# Patient Record
Sex: Male | Born: 1940
Health system: Southern US, Community
[De-identification: ages and names within clinical notes are randomized; demographics above are authoritative.]

## PROBLEM LIST (undated history)

## (undated) DIAGNOSIS — E119 Type 2 diabetes mellitus without complications: Secondary | ICD-10-CM

## (undated) DIAGNOSIS — R079 Chest pain, unspecified: Secondary | ICD-10-CM

## (undated) DIAGNOSIS — G40909 Epilepsy, unspecified, not intractable, without status epilepticus: Secondary | ICD-10-CM

## (undated) DIAGNOSIS — E785 Hyperlipidemia, unspecified: Secondary | ICD-10-CM

## (undated) DIAGNOSIS — I10 Essential (primary) hypertension: Secondary | ICD-10-CM

## (undated) DIAGNOSIS — F32A Depression, unspecified: Secondary | ICD-10-CM

## (undated) DIAGNOSIS — E559 Vitamin D deficiency, unspecified: Secondary | ICD-10-CM

## (undated) DIAGNOSIS — J449 Chronic obstructive pulmonary disease, unspecified: Secondary | ICD-10-CM

## (undated) DIAGNOSIS — I519 Heart disease, unspecified: Secondary | ICD-10-CM

## (undated) DIAGNOSIS — R1310 Dysphagia, unspecified: Secondary | ICD-10-CM

## (undated) DIAGNOSIS — F329 Major depressive disorder, single episode, unspecified: Secondary | ICD-10-CM

## (undated) DIAGNOSIS — G250 Essential tremor: Secondary | ICD-10-CM

## (undated) DIAGNOSIS — G2401 Drug induced subacute dyskinesia: Secondary | ICD-10-CM

## (undated) HISTORY — DX: Major depressive disorder, single episode, unspecified: F32.9

## (undated) HISTORY — DX: Essential tremor: G25.0

## (undated) HISTORY — DX: Heart disease, unspecified: I51.9

## (undated) HISTORY — DX: Vitamin D deficiency, unspecified: E55.9

## (undated) HISTORY — PX: INGUINAL HERNIA REPAIR: SUR1180

## (undated) HISTORY — DX: Depression, unspecified: F32.A

## (undated) HISTORY — DX: Hyperlipidemia, unspecified: E78.5

## (undated) HISTORY — DX: Type 2 diabetes mellitus without complications: E11.9

## (undated) HISTORY — DX: Epilepsy, unspecified, not intractable, without status epilepticus: G40.909

## (undated) HISTORY — DX: Chronic obstructive pulmonary disease, unspecified: J44.9

## (undated) HISTORY — DX: Essential (primary) hypertension: I10

---

## 2003-05-22 ENCOUNTER — Other Ambulatory Visit: Payer: Self-pay

## 2003-05-23 ENCOUNTER — Other Ambulatory Visit: Payer: Self-pay

## 2003-07-27 ENCOUNTER — Inpatient Hospital Stay (HOSPITAL_COMMUNITY): Admission: EM | Admit: 2003-07-27 | Discharge: 2003-08-02 | Payer: Self-pay | Admitting: Emergency Medicine

## 2003-08-06 ENCOUNTER — Inpatient Hospital Stay (HOSPITAL_COMMUNITY): Admission: EM | Admit: 2003-08-06 | Discharge: 2003-08-09 | Payer: Self-pay | Admitting: Emergency Medicine

## 2003-11-14 ENCOUNTER — Inpatient Hospital Stay (HOSPITAL_COMMUNITY): Admission: EM | Admit: 2003-11-14 | Discharge: 2003-11-17 | Payer: Self-pay

## 2004-03-03 ENCOUNTER — Ambulatory Visit: Payer: Self-pay | Admitting: Critical Care Medicine

## 2004-03-03 ENCOUNTER — Inpatient Hospital Stay (HOSPITAL_COMMUNITY): Admission: EM | Admit: 2004-03-03 | Discharge: 2004-03-07 | Payer: Self-pay | Admitting: Emergency Medicine

## 2005-03-09 ENCOUNTER — Ambulatory Visit: Payer: Self-pay | Admitting: Physical Medicine & Rehabilitation

## 2005-03-09 ENCOUNTER — Inpatient Hospital Stay (HOSPITAL_COMMUNITY): Admission: AD | Admit: 2005-03-09 | Discharge: 2005-03-15 | Payer: Self-pay | Admitting: Pediatrics

## 2007-02-14 ENCOUNTER — Emergency Department (HOSPITAL_COMMUNITY): Admission: EM | Admit: 2007-02-14 | Discharge: 2007-02-14 | Payer: Self-pay | Admitting: Emergency Medicine

## 2007-07-13 ENCOUNTER — Ambulatory Visit: Payer: Self-pay | Admitting: Cardiovascular Disease

## 2010-08-28 NOTE — Discharge Summary (Signed)
[name], [name] NO.:  [id number]   MEDICAL RECORD NO.:  [id number]          PATIENT TYPE:  INP   LOCATION:  3020                         FACILITY:  [facility]   PHYSICIAN:  Genene Churn. Love, M.D.    DATE OF BIRTH:  Aug 24, 1940   DATE OF ADMISSION:  03/09/2005  DATE OF DISCHARGE:                                 DISCHARGE SUMMARY   ADDENDUM:   HOSPITAL COURSE:  The patient had no arrhythmias and telemetry was  discontinued. He had no further episodes of seizure-like events between  March 14, 2005, and March 15, 2005. Today he has still not been seen  by occupational therapy but physical therapy feels that he is at his  baseline and speech therapy feels that he should be on dysphagia III diet  with mechanical soft liquids with teaspoon only. He was to be seen by  occupational therapy prior to discharge.   IMPRESSION:  1.  Seizures, code 445.10 and 345.40 with possible pseudoseizures.  2.  Essential tremor code 333.1, aggravated by Depakote, 995.20.  3.  Chronic obstructive pulmonary disease, code 496.0.  4.  Depression, code 311.  5.  History of head trauma as a teenager.  6.  Gait disorder, code 781.2.   PLAN AT THIS TIME:  Discharge the patient with an increase in medications.  Lipitor will be at 40 mg daily.  Carbamazepine will be at 200 mg q.i.d.  Vasotec at 5 mg b.i.d. Depakote 500 mg q.i.d. Phenobarbital 60 mg nightly  and Zoloft 75 mg nightly.  He is to return to Dr. Sharene Skeans in two weeks for  consideration of an evaluation at [facility] with epilepsy monitoring.           ______________________________  Genene Churn. Sandria Manly, M.D.     JML/MEDQ  D:  03/15/2005  T:  03/15/2005  Job:  [id number]

## 2010-08-28 NOTE — Consult Note (Signed)
NAMEGERRAD, WELKER NO.:  [id number]   MEDICAL RECORD NO.:  [id number]          PATIENT TYPE:  INP   LOCATION:  3108                         FACILITY:  [facility]   PHYSICIAN:  Shan Levans, M.D. LHCDATE OF BIRTH:  February 04, 1997   DATE OF CONSULTATION:  03/03/2004  DATE OF DISCHARGE:                                   CONSULTATION   CONSULTING PHYSICIAN:  Shan Levans, M.D. [facility]   CHIEF COMPLAINT:  Respiratory failure, status post seizure.   HISTORY OF PRESENT ILLNESS:  This is a 70 year old white male was admitted  earlier in the day, on March 03, 2004, after he was seen in the office  this morning and found to have active seizing.  He was sent to the emergency  room.  He had seizure activity involving the left hand, arm, and face.  EEG  done in the ER showed right sided focus of a seizure.  The patient has a  previous history of seizure disorder, has been on Keppra, phenobarbital in  the past and has broken through this.  He is now admitted for further  inpatient care.  After he got up to the unit this evening, he was found to  be more apneic and actually developed a full code status.  The patient  basically stopped breathing.  The family member pulled the code blue button.  The patient was seen by the rapid response team and intubated.  This patient  who underwent status epilepticus and unable to maintain his airway.  He is  now on the ventilator in the ICU and we are asked to see this patient in  consultation for vent management.   PAST MEDICAL HISTORY:  Medical history:  1.  Seizure disorder.  2.  History of hypertension.  3.  Hypercholesterolemia.  4.  Chronic obstructive lung disease and has been on Advair in the past as      well.   CURRENT MEDICATIONS:  1.;  Depakote 500 mg b.i.d.  1.  Keppra 500 mg b.i.d.  2.  Phenobarbital 60 mg h.s.  3.  Protonix 40 mg daily.  4.  Tegretol 200 mg t.i.d.  5.  Vasotec 5 mg daily.   ALLERGIES:  PENICILLIN.   SOCIAL HISTORY:  He lives with his wife and denies smoking or alcohol.   Family history, review of systems otherwise noncontributory.   PHYSICAL EXAMINATION:  GENERAL:  This an ill-appearing, sedated white male  in no acute distress.  VITAL SIGNS:  Temp 97, blood pressure 136/98, sat 10%, pulse 87.  CHEST:  Showed to be equal breath sounds.  No wheeze or rhonchi.  CARDIAC:  Showed a regular rate and rhythm without S3.  Normal S1 S2.  ABDOMEN:  Soft, nontender.  EXTREMITIES:  Showed no edema or clubbing.  SKIN:  Clear.  NEUROLOGIC:  Intact.  HEENT:  Showed no jugular venous distention or lymphadenopathy.  Oropharynx  clear.  NECK:  Supple.   LABORATORY DATA:  Sodium 137, potassium 4.2, chloride 104, CO2 24, BUN  16,  creatinine 0.7.  Liver functions unremarkable.  White count 6.1, hemoglobin  15.8.  CK-MB unremarkable.   Chest x-ray showed ET tube in good position.  No acute infiltrates.   IMPRESSION:  1.  Seizure disorder with status epilepticus.  2.  Postictal state with apnea.  3.  Ventilatory dependent respiratory failure.   PLAN:  Full vent support.  Sedate protocol.  Nebs for underlying COPD.      Patr   PW/MEDQ  D:  03/03/2004  T:  03/04/2004  Job:  [id number]   cc:   Deanna Artis. Sharene Skeans, M.D.  [address]  [address]  [place]  [place] [address]  Fax: [phone]   Melvyn Novas, M.D.  [address]  [address]  [place]  [place] [address]  Fax: [phone]   patient's chart

## 2010-08-28 NOTE — H&P (Signed)
[name], [name]                         ACCOUNT NO.:  [id number]   MEDICAL RECORD NO.:  [id number]                   PATIENT TYPE:  INP   LOCATION:  0103                                 FACILITY:  [facility]   PHYSICIAN:  Elliot Cousin, M.D.                 DATE OF BIRTH:  26-Jan-1941   DATE OF ADMISSION:  08/06/2003  DATE OF DISCHARGE:                                HISTORY & PHYSICAL   CHIEF COMPLAINT:  Recurrent seizures.   HISTORY OF PRESENT ILLNESS:  [name] is a 70 year old man recently  admitted on July 27, 2003 with a chief complaint of acute mental status  changes. During the hospitalization from July 27, 2003 until August 02, 2003, it was determined that the patient had a seizure disorder. The seizure  disorder was described as complex partial seizures. The patient was treated  with Dilantin during the hospitalization. He had at least 2 seizures  witnessed during the previous hospitalization. At the time of hospital  discharge, he was ambulating. His delirium had completely cleared. He was  eating and voiding fine. His Dilantin level prior to hospital discharge on  August 02, 2003 was mildly elevated at 25.7. Per his wife, over the past 3  days, the patient has had multiple seizures. He had 1 seizure on the day of  hospital discharge at home. He had 2 more seizures the following day. He had  approximately 8 seizures day before yesterday. The patient had 3 witnessed  seizures the day before his wife decided to bring him back to the hospital.  His last seizure was several hours before he presented to the emergency  department. The seizures are described as mouth drawing, eyes deviating to  the left or to the right, and symptoms consistent with gasping for air. The  patient does not lose complete consciousness. He has not been witnessed to  have total clonic tonic seizures. There has been no tongue biting and no  incontinence of bladder or bowels over the past 24 hours.  His wife held him  up today and prevented him from falling following the seizure. The patient  does not recall having the multiple seizures today. He currently complains  of headache and dry mouth. He also has had some intermittent dizziness. He  has also complained of right sided chest discomfort and constipation. He  denies shortness of breath, pleurisy, cough, fever or chills. He denies  nausea, vomiting or diarrhea. He denies any blindness. He denies any double  vision. When the patient was evaluated in the emergency department, he was  witnessed to have another seizure, which lasted a few seconds and then  stopped. Over the past 2 hours, he has not had another witnessed seizure. It  was found that his Dilantin level was elevated at 27.0. The remainder of his  lab results were unremarkable. The patient will therefore be admitted for  further evaluation  of his seizure disorder and presumed Dilantin toxicity.   PAST MEDICAL HISTORY:  1. Complex partial seizures, diagnosed April 2005.  2. Hypertension.  3. Chronic obstructive pulmonary disease.  4. Hyperlipidemia.  5. Chronic low back pain.  6. History of concussive head injury as a teenager.  7. Status post left inguinal hernia repair as a teenager.  8. History of a left clavicular fracture.   MEDICATIONS:  1. Dilantin 375 mg p.o. q.h.s.  2. Enalapril 5 mg b.i.d.  3. Advair 250/50 mcg 1 puff b.i.d.  4. Zoloft 50 mg q.d.  5. Zocor 20 mg daily.   ALLERGIES:  PENICILLIN.   SOCIAL HISTORY:  The patient is married and lives in [place] with  his wife. He has not been employed in approximately 1 year. 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 He has a  greater than 100 pack year smoking history, however, he stopped smoking in  February of 2005. He denies alcohol use. He denies any illicit drug use. He  can read and write.   FAMILY HISTORY:  His mother died at 44 years of age secondary  to  complications from chronic obstructive pulmonary disease. His father died at  13 years of age secondary to lung cancer. He had a brother who died at 65  years of age from pancreatic cancer. He has a son who is 97 years of age and  healthy. He has a daughter who is 79 years old and healthy. He has another  daughter who is 44 years of age and is healthy. He has a brother who is  alive at 9 years of age and is healthy. He has a sister who is 70 years of  age and is healthy.   REVIEW OF SYSTEMS:  As above in the history of present illness.   PHYSICAL EXAMINATION:  VITAL SIGNS:  Temperature 97. Blood pressure 140/90,  pulse 86, respiratory rate 18. Oxygen saturation on room air is 96%.  GENERAL:  The patient is a pleasant, alert 70 year old Caucasian man who is  currently sitting up in bed, in no acute distress.  HEENT:  Head is normocephalic, atraumatic. Pupils are equal, round, and  reactive to light. Extraocular movements are intact. Conjunctiva are clear.  Sclerae are white. Tympanic membranes are clear bilaterally. Nasal mucosa is  moist. No drainage. Oropharynx reveals fair dentition. His oropharynx mildly  dry. No posterior exudates or erythema. He appears to have abnormal tongue  movement.  NECK:  Supple. No adenopathy. No thyromegaly. No bruit. No jugular venous  distention.  LUNGS:  Clear to auscultation bilaterally.  HEART:  S1 and S2 with a soft systolic murmur.  ABDOMEN:  Positive bowel sounds. Soft, mildly tender over the hypogastrium.  No distention. No guarding. No masses palpated.  EXTREMITIES:  The patient's pedal pulses are 2+ bilaterally. No pre-tibial  edema. No pedal edema.  NEUROLOGIC:  The patient is alert and oriented to self, wife, year and  month. He was not oriented to time or day or place. He follows directions  well. His upper extremity strength reveals 4-/5. Right upper extremity weakness which is chronic per his history. A 5/5 strength of the left  upper  extremity. Bilateral lower extremity strength is 5/5 symmetrically. Strength  was tested at rest. Gait was not tested. Cerebellar, with finger-to-nose  bilaterally he had some mild over shoot but no dysmetria or overt ataxia. He  did demonstrate abnormal tongue movements.  LABORATORY DATA:  On admission white blood cell 8.6, hemoglobin 15.3,  hematocrit 43.6, MCV 88.1, platelets 260,000. Arterial blood gas on 2 liters  pH 7.55, pCO2 24.7, pO2 87.4. Sodium 138, potassium 3.9, chloride 107, CO2  25, glucose 98, BUN 15, creatinine 0.9, calcium 9.3, Dilantin level 27.0 (10  to 20 within normal limits).   ASSESSMENT:  Recurrent seizure disorder. The patient was diagnosed with  complex partial seizures approximately 2 weeks ago. He was sent home on  Dilantin 375 mg p.o. q.h.s. It is important to note that his Dilantin level  was mildly super therapeutic at 25.7 at the time of hospital discharge on  August 02, 2003. Today, he comes in with a Dilantin level of 27.0. The  recurrent seizures could be secondary to Dilantin toxicity and/or  uncontrolled seizures secondary to possibly a non-effective anti-convulsant.  Currently, he is almost back to baseline. He does have some residual post  ictal confusion, however, he is able to follow commands very well on  examination.   PLAN:  1. Dilantin will be held for now.  2. Will admit the patient to a telemetry bed and close cardiac monitoring.  3. Will check a Dilantin level every morning x3 mornings.  4. IV fluids with normal saline at 100 cc an hour.  5. Will treat recurrent seizures with p.r.n. Ativan 1 to 4 mg p.r.n.  6. Will ask for a neurology consultation. However, a curb side consultation     was obtained with Dr. Orlin Hilding. She suggested holding the Dilantin level.     She also felt that the Dilantin toxicity could be the etiology of the     patient's recurrent seizures.  7. Seizure precautions.  8. Supportive care.                                                Elliot Cousin, M.D.    DF/MEDQ  D:  08/06/2003  T:  08/06/2003  Job:  [id number]   cc:   Servando Snare, P.A.  [facility]   Deanna Artis. Sharene Skeans, M.D.  [address]  [address]  [place]  [place] [address]  Fax: [phone]   Gustavus Messing. Orlin Hilding, M.D.  [address]  [address]  [place]  [place] [address]  Fax: [phone]

## 2010-08-28 NOTE — Discharge Summary (Signed)
NAMEJACQUEES, [name] NO.:  [id number]   MEDICAL RECORD NO.:  [id number]          PATIENT TYPE:  INP   LOCATION:  3024                         FACILITY:  [facility]   PHYSICIAN:  Melvyn Novas, M.D.  DATE OF BIRTH:  1940-12-22   DATE OF ADMISSION:  03/03/2004  DATE OF DISCHARGE:                                 DISCHARGE SUMMARY   PRIMARY CARE PROVIDERS:  1.  Dr. Deanna Artis. Hickling.  2.  Dr. Steele Berg. Phifer.   HOSPITAL COURSE:  [name] is a 70 year old gentleman who suffered a  couple of months ago, a concussion or contusion and since then, has  presented with seizure-like activity.  He came to the office on March 03, 2004, in the early morning, and seemed to present with multiple focal  seizures.  Secondary generalization was not seen, but Dr. Deanna Artis.  Hickling arranged for him to be emergently transferred to the hospital,  where he was immediately receiving EEG coverage in the emergency room.  The  EEG showed no active seizure activity, but right-sided intermittent focal  slowing, but the patient had presented with alternating left- and right-  sided arm tonic-clonic activity, so that a non-epileptic origin cannot be  ruled out.  The patient, who had been on multiple-drug regimen at the time,  received Cerebyx 1 g IV here in the ER.  The same night, he developed  hypopneas or apneas and was intubated by the code team on call.  He was  extubated the very next day again and has since then not had any respiratory  difficulties.   CURRENT MEDICAL REGIMEN:  His current regimen for medication is:  1.  Valproic acid 500 mg b.i.d. p.o.  2.  Carbamazepine 200 mg 4 times daily p.o.  3.  Phenobarbital 60 mg at night.  4.  Vasotec 5 mg daily.  5.  Pepcid over-the-counter medication once a day.  6.  He is allowed to take a baby aspirin.  7.  Keppra, a medication he was on at the time of admission, was meanwhile      discontinued.   IMAGING STUDIES:  The  patient had an MRI which showed no abnormalities, a CT  scan which was motion-impaired and seemed to indicate a possible right-sided  hypo-density which has not been verified by MRI study which followed.   DISCHARGE INSTRUCTIONS AND MEDICATIONS:  The patient is not allowed to drive  for 6 months.  He will remain on the valproic acid, carbamazepine and  phenobarbital regimen.  He has to follow up with the teaching service clinic  in 3-4 weeks and with Darrol Angel and Dr. Ellison Carwin at the  [facility] office in 3-4 weeks.  Prescription for  valproic acid was added.   The patient, today, had an ammonia level drawn, which was elevated at 60.  It is recommended that he have another ammonia level drawn on Wednesday or  Thursday of next week; a prescription for this lab will be in his discharge  summary; this is to verify that he does not acquire liver damage  from his  current medications regimen.       CD/MEDQ  D:  03/07/2004  T:  03/07/2004  Job:  [id number]   cc:   [facility]   Steele Berg Phifer, M.D.  [address]  [address]  [place] [address]  Fax: [phone]

## 2010-08-28 NOTE — Discharge Summary (Signed)
NAME:  [name], [name]                         ACCOUNT NO.:  [id number]   MEDICAL RECORD NO.:  [id number]                   PATIENT TYPE:  INP   LOCATION:  5506                                 FACILITY:  [facility]   PHYSICIAN:  Ara D. Tammi Klippel, M.D.                DATE OF BIRTH:  28-Jan-1941   DATE OF ADMISSION:  DATE OF DISCHARGE:  08/02/2003                                 DISCHARGE SUMMARY   PRIMARY CARE PHYSICIAN:  Ms. Tanya Nones, P.A. at [facility].   CONSULTING NEUROLOGIST:  Dr. Deanna Artis. Hickling, [facility].   FINAL DIAGNOSES:  1. Complex partial seizures.  2. Hypertension.  3. Chronic obstructive pulmonary disease.  4. Chronic low-back pain.  5. History of concussive head injury as a teenager.  6. Status post left herniorrhaphy as a teenager.  7. Left clavicular fracture.   FINAL PROCEDURES:  1. Fluoroscopic-guided lumbar puncture performed August 01, 2003, with an     opening pressure of 10 cm, without any complications.  2. Esophogram performed July 31, 2003, showing sliding hiatal hernia with     non obstructing Schatzki ring.  Continuous belching and hiccuping during     the exam precluded double contrast exam.  This was associated with     scattered episodes of intermittent reflux.  3. MRI and MRA of the brain performed July 28, 2003, showing no acute     process with minor changes of small vessel disease, negative intracranial     MRI of the large and medium-sized vessels.  4. A 2-view of the chest performed July 28, 2003, showing no active     disease.  5. CT of the head performed July 27, 2003, showing no acute disease.  6. EEG performed July 29, 2003, showing that the EEG performed during     wakeful state is abnormal due to presence of excessive beta activity.  No     definitive epileptiform features were identified.   PERTINENT LABS AND OTHER TEST RESULTS:  White blood cells 7.5, H&H 15.7 and  44.3, platelets count 221,000.   PT 13.0, INR 1.0, PTT 28, sodium 136,  potassium 4.0, chloride 102, carbon dioxide 28.  Glucose 103.  BUN 11,  creatinine 0.8, calcium 9.0, magnesium 3.4.  Hemoglobin A1c 4.8, cholesterol  245, triglycerides  255, HDL 32, LDL 162.  TSH 0.646, B12 376, Phenytoin  level 5.7.  Urine drug screen was pan negative.  Alcohol level less than 5.  CSF showing no organisms, and no white blood cells.  Cultures still pending.  Glucose 63.  Total protein 44.  VDRL, Alzheimer's markers and CSF cultures  still pending.  VDRL cryptococcal antigen was negative.  RPR was  nonreactive.   SUMMARY OF HOSPITAL COURSE:  The patient is a 70 year old white male with no  significant past medical history, who was admitted for further workup of  progressive neurologic decline.  Problem #1.  Neuropsyche.  There were no signs or symptoms of meningitis,  encephalitis or new onset CVA.  The patient was seen immediately by Dr.  Sharene Skeans in the emergency room, who felt on the differential could have been  postictal stupor, and he was started empirically on anticonvulsive therapy.  Later, although a tonoclonic seizure was never truly witnessed by the  medical staff, the patient's wife who herself suffers from seizure disorder,  saw an episode where he became unresponsive.  His eyes rolled back in the  back of his head, and he had repetitive motion.  However, even before this  was noticed, the patient improved remarkably while on anticonvulsive  therapy.  While he was in house, he had no further seizure episodes.  In  fact, his neurologic status continually improved while he was in house.  There were no in the hospital episodes of acute mania or psychosis.  He was  continued on his SSRI, otherwise, there were no other active issues.   Problem #3.  Cardiopulmonary.  There were no in-hospital episodes of  hypoxemia, orthopnea or PND.  He was maintained on his bronchodilators.   Problem #4.  Cardiovascular.  The patient was  maintained on his outpatient  ACE inhibition with excellent control of his blood pressure.  There were no  signs or symptoms of cardiac ischemia while he was in house.   Problem #5.  Renal.  Medications were renally dosed when appropriate, and  nephrotoxins were avoided.   Problem #6.  GI.  No active issues.   Problem #7.  Fluids, electrolytes and nutrition.  The patient's IV fluids  were med-locked.  His electrolytes were kept within normal limits, and the  patient was evaluated by speech pathology who recommended modifying his diet  by taking small bites or sips only with intermittent supervision.   Problem #8.  Infectious disease.  The patient did undergo a lumbar puncture  to rule out infectious disease.  However, this became much less likely given  his dramatic improvement with Phenytoin.  There were no other signs or  symptoms of systemic infection during admission.   Problem #9.  Endocrine.  The patient's TSH and A1c were all within normal  limits, otherwise no active issues.   Problem #10.  Hem/OC.  No active issues.   Problem #11.  Prophylaxis.  The patient was placed on a PPI for GI  prophylaxis, and was ambulated for DVT prophylaxis.   DISPOSITION:  The patient is being discharged home in much improved  condition, in the care of his wife.  The patient does have a living will  which states that he would not want extraordinary means if he were in a  terminal, incurable or vegetative state.   DISCHARGE MEDICATIONS:  1. Enalapril 5 mg p.o. b.i.d.  2. Fluticasone/ salmeterol 250/50 micrograms, one puff b.i.d.  3. Phenytoin 375 mg p.o. q.h.s.  4. Sertraline 50 mg p.o. daily.  5. Simvastatin 20 mg p.o. daily.   DISCHARGE INSTRUCTIONS:  1. The patient is to take his medications as prescribed.  2. His activity is as tolerated.  3. He is to get a Dilantin level on April 25, with the results called to Dr.     Sharene Skeans at [phone]. 4. He is to followup on Monday, Sep 02, 2003, at 9 a.m. at [facility].  5. He is to return if he feels worse.  Ara D. Tammi Klippel, M.D.    ADM/MEDQ  D:  08/02/2003  T:  08/04/2003  Job:  [id number]   cc:   Tanya Nones, P.A.  [employer], [address]  [place].  [place], [place] [address]   Deanna Artis. Sharene Skeans, M.D.  [address]  [address]  [place]  [place] [address]  Fax: [phone]

## 2010-08-28 NOTE — Consult Note (Signed)
[name], READING                         ACCOUNT NO.:  [id number]   MEDICAL RECORD NO.:  [id number]                   PATIENT TYPE:  INP   LOCATION:  0382                                 FACILITY:  [facility]   PHYSICIAN:  Deanna Artis. Sharene Skeans, M.D.           DATE OF BIRTH:  May 20, 1940   DATE OF CONSULTATION:  08/08/2003  DATE OF DISCHARGE:                                   CONSULTATION   CHIEF COMPLAINT:  Recurrent seizures.   HISTORY OF THE PRESENT CONDITION:  [name] is a 70 year old gentleman  well known to me.  The patient has had a history of gait disorder and  altered mental status dating back to February of 2005.  He had 2 MRI scans,  an EEG, and laboratory work that was unrevealing.  I felt on his initial  assessment last hospitalization that he might be having ictal stupor, which  is a continuous partial complex seizure.  He was treated with IV Dilantin,  and the next morning seemed to be better.  As the days progressed, his  mental status improved more and more.  He became alert, had mild right  central 7th, and a gait disorder; however, even that was more steady with  time.   The patient had 2 seizures during his hospital stay after loading with  Dilantin, and then, over the last 4 days of evaluation, had no known  seizures.  His Dilantin level climbed to 23.5, and I dropped his dose.  Unfortunately, it climbed to 25.7 on the day of discharge, and I was not  notified by the hospitalist service, or I would have told them to hold his  dose for a day, and dropped his level.   At home, the patient has had a series of brief seizures that were 10-15  seconds in duration, associated with generalized tonic-clonic jerking of his  extremities.  Eyes deviated up.  Tongue deviated to the left.  Some facial  grimace to the left.  The patient had one seizure on the day of discharge.  He had 2 seizures on Saturday, 8-10 seizures on Sunday, extending for 10  minutes, and 3  witnessed seizures on Monday.  He was brought into the  hospital on Tuesday for evaluation.  His wife was scared.  She felt that he  had not improved, but really meant that he had not improved in terms of his  seizure control.  The patient had no memory for his seizures, and complained  of a headache and dry mouth on admission, and also intermittent dizziness.  He was noted to have a Dilantin level of 27.  The day before it had been  29.1.   PAST MEDICAL HISTORY:  1. Hypertension.  2. Chronic obstructive pulmonary disease.  3. Dyslipidemia.  4. Chronic low back pain.  5. Prior concussive head injury as a teenager.  6. Left clavicular fracture.   PAST SURGICAL HISTORY:  Left inguinal herniorrhaphy as a teenager.   MEDICATIONS:  1. The patient is currently off Dilantin.  He had been on 375 per day, and     before that 400.  2. Enalapril 5 mg b.i.d.  3. Advair 250/50 mcg one puff b.i.d.  4. Zoloft 50 mg per day.  5. Zocor 20 mg per day.   DRUG ALLERGIES:  PENICILLIN (hives).   FAMILY HISTORY:  Mother died at age 61 secondary to chronic obstructive  pulmonary disease.  Father died at age 71 secondary to lung cancer.  Brother  died at age 70 from pancreatic cancer.  Son, age 8, and daughter, age 33,  are alive and well.  Brother, age 44, and sister age 9, alive and well.   SOCIAL HISTORY:  The patient lives in [place].  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  The patient has a greater than 100 pack year history of  smoking, but stopped smoking in February of 2005.  He does not use alcohol  or drugs.  The patient is literate.  I do not recall how far he went through  school.   REVIEW OF SYSTEMS:  Negative, except as noted above.   I was asked to see him to comment on his Dilantin toxicity and recurrent  seizures, and to make recommendations for further work-up and treatment.   PHYSICAL EXAMINATION:   VITAL SIGNS:  On examination today, temperature 97.5,  blood pressure 124/70, resting pulse 77, respirations 20, O2 saturation 99%  on 2 liters.  HEENT:  No infection, bruits, or meningismus.  LUNGS:  Clear.  HEART:  No murmurs.  Pulses normal.  ABDOMEN:  Soft.  Bowel sounds normal.  No hepatosplenomegaly.  EXTREMITIES:  Unremarkable.  NEUROLOGIC:  The patient was awake and alert.  He scored 25 out of 30 on his  mini mental status exam.  He lost 1 for being unable to tell me what season  it was, 2 for spelling the word world backwards dlorw, and 2 for inability  to properly construct intersecting pentagons, and for difficulty with  repeating the phrased no ifs, ands, or buts.  Cranial nerves - round and  reactive pupils.  Visual fields full to double simultaneous stimuli.  The  patient has a mild right central seventh at rest with movement.  His face is  symmetric.  This is similar to a previous hospitalization.  He had midline  tongue and uvula.  Air conduction greater than bone conduction.  Motor  examination - no drift.  He had normal strength, tone, and mass.  Fine motor  movements were okay.  They were a little slow and clumsy, but probably  within the range of normal.  He is right-handed.  Sensory examination -  sensory stocking, polyneuropathy.  He had good stereoagnosis bilaterally.  Cerebellar examination - good finger-to-nose, rapid repetitive movements.  Gait dystaxic, tentative, slightly broad based.  This is a little worse than  it was last week, and I suspect it is related to his Dilantin toxicity.  Deep tendon reflexes normal at the knees and ankles, diminished in the upper  extremities.  The patient had bilateral flexor plantar responses.  There  were no frontal lobe release signs.   IMPRESSION:  1. Recurrent seizures, likely partial onset with secondary generalization,    possibly a right brain signature, 345.41 and 345.11.  2. Organic gait disorder, which was both a  primary problem (MRI scan showed     minimal small vessel white matter disease.  Spinal tap was normal.  EEG's     have been essentially normal), and also related to the Dilantin toxicity.  3. Mild organic brain syndrome.  Work-up was supposed to include markers for     Alzheimer's and __________, but they apparently were not sent.  VDRL,     cryptococcal antigen, and cell count were unremarkable.  Total protein     was 44, which is slightly abnormal, but does not point to any obvious     etiology.   PLAN:  The patient needs an EEG.  We need to hold his Dilantin until level  is less than 20, and to restart him at 125 mg twice daily.  If that does not  work to control his seizures, will need to consider Depakote or  carbamazepine as possibilities.  There are obviously a host of other  medications that could be tried, but that are much more expensive.  The patient needs to have a physical therapy consult for gait training.  He  might be able to go home tomorrow, once all this is completed.  I appreciate  the opportunity to participate in his care.  If you have questions, do not  hesitate to contact me.                                               Deanna Artis. Sharene Skeans, M.D.    [facility]  D:  08/08/2003  T:  08/08/2003  Job:  [id number]   cc:   Elliot Cousin, M.D.   Servando Snare, M.D.  [facility]

## 2010-08-28 NOTE — Procedures (Signed)
CONSULTING PHYSICIAN:  Melvyn Novas, M.D.   Mr. Geralynn Ochs has an EEG number [phone].   TECHNICIAN:  B. Kolp.   The study is performed as a routine EEG by a 16-channel EEG recording, one-  channel representing heart rate and rhythm exclusively in the international  10/20 placement system.  The patient is described as awake, alert, without  deficits, a right handed male.  He was not exposed to hyperventilation but  to photic stimulation.   MEDICATIONS:  Tegretol, Zoloft, Vasotec, Zocor, and Dilantin.  The patient  was supposed to be on Keppra but apparently was unable to obtain patient  assistance and can not afford the medication.   PAST MEDICAL HISTORY:  1. COPD.  2. Left hernia.  3. Brain concussion.  4. Suspected seizures.  5. Hypertension.  6. Depression.   The patient is a 70 year old right handed male who has had events of hand  twitching, is able to talk through his apparent focal seizures, but seems to  be sometimes confused.  He has not lost complete consciousness.  A spell was  observed by an Charity fundraiser, another one by his wife.  He has only one set of what was  thought to be a generalized major seizure.   A posterior dominant rhythm of 10 hertz is seen throughout posterior  hemispheres.  The study is technically challenging due to electrode popping  and motion artifact in the recording.  The EKG seems to be a normal sinus  rhythm throughout.  Brain wave activity is symmetrica and synchronous and  void of epileptiform discharges.  Photic stimulation did not lead to photic  __________  or epileptiform discharges, nor did stimulation by tactic  stimuli.  Hyperventilation was deferred.   CONCLUSIONS:  This was a normal electroencephalogram for the patient's age  and conscious state with possible left sided intermittent slowing, but due  to motion artifact and electrode detachment, this is a far from certain  finding.    Melvyn Novas, M.D.   AO:ZHYQ  D:  11/15/2003  16:43:52  T:  11/15/2003 18:19:01  Job #:  [id number]   cc:   Deanna Artis. Sharene Skeans, M.D.  [address]  [address]  [place]  [place] [address]  Fax: [phone]   Genene Churn. Love, M.D.  [address]  [address]  [place]  [place] [address]  Fax: [phone]

## 2010-08-28 NOTE — Discharge Summary (Signed)
[name]                         ACCOUNT NO.:  [id number]   MEDICAL RECORD NO.:  [id number]                   PATIENT TYPE:  INP   LOCATION:  0382                                 FACILITY:  [facility]   PHYSICIAN:  Elliot Cousin, M.D.                 DATE OF BIRTH:  [birth year]   DATE OF ADMISSION:  08/06/2003  DATE OF DISCHARGE:  08/09/2003                                 DISCHARGE SUMMARY   DISCHARGE DIAGNOSES:  1. Recurrent seizures secondary to underlying seizure disorder and Dilantin     toxicity.     A. Dilantin level at the time of admission was 27.  Dilantin level at the        time of discharge was 16.3.  2. Complex partial seizures diagnosed July 28, 2003.  3. Chronic gait disorder.   SECONDARY DISCHARGE DIAGNOSES:  1. Hypertension.  2. Chronic obstructive pulmonary disease.  3. Hyperlipidemia.  4. Chronic low back pain.  5. Depression.  6. History of convulsive head injury as a teenager.  7. Status post left inguinal hernia repair as a teenager.  8. History of left clavicular fracture in the past.  9. Chronic congenital right upper extremity weakness.   DISCHARGE MEDICATIONS:  1. Dilantin 100 mg b.i.d.  2. Dilantin 50 mg 1/2 tablet b.i.d.  3. Vasotec 5 mg b.i.d.  4. Advair 1 puff b.i.d.  5. Zoloft 50 mg daily.  6. Zocor 20 mg daily.   DISCHARGE DISPOSITION:  The patient was discharged to home in improved and  stable condition on August 09, 2003.  He was advised to follow up with the  PA, Ms. Folkerts at [employer] in 3-4 days.  He should have a  Dilantin level drawn then.  He will also follow up with Dr. Sharene Skeans on Sep 02, 2003 at 9:30 a.m.   CONSULTATIONS:  Dr. Ellison Carwin.   PROCEDURE PERFORMED:  EEG on August 08, 2003.  The results revealed  generalized slowing.  No seizure activity.  No significant changes since  April 18th EEG.   HISTORY OF PRESENT ILLNESS:  Mr. [name] is a 70 year old man who was  recently diagnosed with  complex partial seizures on July 28, 2003, who  returned to the emergency department on August 06, 2003 with recurrent  seizure.  The history was provided by the patient's wife.  The patient  apparently had multiple seizures at home over the 2-3 days prior to hospital  admission.  A seizure was actually witnessed by the emergency department  physician, Dr. Effie Shy, while the patient was in the emergency department.  When he was evaluated in the emergency department, his Dilantin level was  found to be supratherapeutic at 27.  The patient was therefore admitted for  further evaluation.   HOSPITAL COURSE:  RECURRENT SEIZURES SECONDARY TO UNDERLYING SEIZURE  DISORDER AND DILANTIN TOXICITY:  The initial management included seizure  precautions,  stopping the Dilantin, IV fluids with normal saline at 100  cc/hr, and Ativan as needed for recurrent seizures.  The patient was  initially kept n.p.o. until he was more alert.  The diet was subsequently  advanced to clear liquids and then to a 4 gm sodium diet.  Neuro checks were  instituted every 4 hours x36 hours. The patient was placed on seizure  precautions.  On initial exam, the patient was somewhat postictal, although  he was able to answer questions appropriately.  He did have some  intermittent blurred vision, double vision, and dry mouth.  He was  subsequently admitted to a telemetry bed.  Dr. Sharene Skeans, neurologist, was  consulted.  The recurrent seizures were thought to be secondary to Dilantin  toxicity versus the underlying seizure disorder; however, the patient was  seizure-free prior to hospital discharge several days prior.  There was some  concern regarding whether or not the patient was actually having complex  partial seizures, as witnessed by his wife.  During the three-day hospital  course, the patient had no witnessed seizures.  Seizure precautions were  discontinued, and the patient was advised to ambulate and to get out of bed   with assistance.  The following day after hospital discharge, the patient  was totally alert and oriented and following directions.  He ambulated with  the nursing staff and physical therapy without any difficulty.  He does have  a chronic gait disorder, which sometimes necessitates a walker.  He really  had no specific outpatient physical therapy needs at the time of hospital  discharge.  The patient's Dilantin levels were checked on a daily basis.  At  the time of hospital discharge, his Dilantin level was 16.3.  Dr. Sharene Skeans  recommended restarting the Dilantin when his phenytoin levels were less than  20.  He started the Dilantin at a lower dose, 125 mg b.i.d.  This has  decreased from 375 mg q.h.s. previously.  An EEG was reordered during this  admission.  It revealed generalized slowing but no significant changes  compared with the EEG which was performed on July 29, 2003.   Patient was advised to follow up with his primary care physician in 3-4 days  for hospital followup and to redraw a phenytoin level.  He was advised to  follow up as scheduled with Dr. Sharene Skeans on Sep 02, 2003 at 9:30 a.m.  It is  important to note that Dr. Sharene Skeans did discuss options for anticonvulsant  therapy if the Dilantin therapy failed.  He considered generic  carbamazepine, Trileptal, Lamictal, and Keppra.  The patient does have  financial constraints, and this has a bearing on what type of  anticonvulsants to treat the patient with if the Dilantin fails.                                               Elliot Cousin, M.D.    DF/MEDQ  D:  08/11/2003  T:  08/11/2003  Job:  [id number]   cc:   Deanna Artis. Sharene Skeans, M.D.  [address]  [address]  [place]  [place] [address]  Fax: [phone]   Ms. Folkerts  [employer]

## 2010-08-28 NOTE — Discharge Summary (Signed)
[name], Jay NO.:  [id number]   MEDICAL RECORD NO.:  [id number]          PATIENT TYPE:  INP   LOCATION:  3020                         FACILITY:  [facility]   PHYSICIAN:  Genene Churn. Love, M.D.    DATE OF BIRTH:  [birth year]   DATE OF ADMISSION:  03/09/2005  DATE OF DISCHARGE:  03/15/2005                                 DISCHARGE SUMMARY   Patient's address is [address], [place], [place] [address].   This 70 year old right-handed white married male was admitted from the  office on March 09, 2005 for suspected recurrent seizures by Dr.  Sharene [name].   HISTORY OF PRESENT ILLNESS:  [name] has had a concussion or contusion  in the past and has been treated as has been felt to have seizures since  that time. He has had multiple focal seizures but secondary generalization  was not seen. He was admitted by [name] in February 12, 2004 for  recurrent suspected seizures. He received EEG's in the emergency room  showing no evidence of seizure activity at that time but some intermittent  right-sided focal slowing. At that time, the consideration of nonepileptic  seizures was raised. An MRI study in November 2005 showed no evidence of  abnormalities and a CT scan was motion impaired but seemed to indicate a  hypodensity in the right brain which was not confirmed on the MRI study. The  patient was seen the day of admission in Dr. Darl [name] office as an  outpatient with multiple episodes of blank stares thought to be a complex  partial seizure that did not respond to 500 milligrams of IV Depakote and he  was admitted for further evaluation.   Past medical history is significant for essential tremor worsen by Depakote,  organic gait disorder require a cane, hyperlipidemia, hypertension, lower  back pain, chronic obstructive pulmonary disease, depression, closed head  injury as a teenager with presumed seizures since that time, and left  clavicular  fracture. He had a past history of inguinal herniorrhaphy.   MEDICATIONS ON ADMISSION:  1.  Carbamazepine 200 milligrams t.i.d.  2.  Depakote 500 milligrams q.i.d.  3.  Lipitor 40 milligrams in the evening.  4.  Vasotec 5 milligrams twice daily.  5.  Zoloft 75 milligrams in the evening.  6.  Phenobarbital 64.8 milligrams per day.   ALLERGIES:  He has a history of allergies to PENICILLIN and is intolerant of  KEPPRA.   The patient lives with his wife. His daughter lives near him across the  street. He has had coronary artery disease with coronary artery bypass  surgery. He quit smoking cigarettes in May 15, 2003 and does not drink  alcohol. At the time of admission, his neurologic examination revealed vital  signs to be normal.   Family history has been dictated elsewhere.   At the time of admission, his neurologic examination revealed that  intermittently he was conversational and at other times he was unresponsive.  His visual fields were full. His face was symmetric. His motor examination  revealed good strength in the upper and in  the lower extremities. He had  essential tremor with intention tremor and kinetic tremor. Deep tendon  reflexes were decreased. His general examination was unremarkable.   LABORATORY DATA:  Laboratory data revealed a white blood cell count of 5100,  hemoglobin was 14.7, hematocrit 41.2, platelet count 135,000. There were 41%  polys, 40% lymphs, 12% monocytes, 6% eosinophils, 1% basophils. Sodium is  139, potassium 3.7, chloride 103, CO2 content 29, glucose 80, BUN 11,  creatinine 0.7, total bilirubin 0.8. Alkaline phosphate 48, SGOT 31, SGPT  22, total protein 5.8, albumin 3.3, calcium 8.4. His initial Carbamazepine  levels 4.3 on March 09, 2005 with a repeat of 4.9 on March 10, 2005.  Phenobarbital level is 28.3. Initial valproic acid level was 92.8 on  March 09, 2005 with a repeat of 84.6 on March 10, 2005. His telemetry  in the  hospital showed normal sinus rhythm. Portable abdominal film showed  feeding tube at the tip of the gastric antrum. Fluoroscopy for swallowing  function was performed in the hospital. EEG showed some mild background  slowing and some left frontal delta waves. There was no change during  periods of unresponsive behavior.   HOSPITAL COURSE:  The patient was admitted and question of pseudoseizures  was raised. At times during episodes, he would not respond at all. During  the EEG, it was felt that the EEG was normal and no definite epileptiform  activity was seen. He was noted to have asymmetric exam complicated by  significant tremor. He underwent a modified barium swallow study which  showed mild pharyngeal dysphagia with a delayed swallow of all liquids with  instances of laryngeal penetration above the vocal cords and cough response  noted. It was felt that the patient was at further risk for aspiration and  it was recommended he be placed on a mechanical soft diet, dysphagia three  with thin liquids. The thin liquids to be given by teaspoon full only, no  cup sips, crushed medications and need to be fed secondary to the tremors.  The patient was seen by physical therapy who recommended rehabilitation  versus skilled nursing home because of his tremor and gait disorder. The  patient tolerated the diet pretty well in the hospital. He had one episode  on March 12, 2005 of possible seizure. The patient was being helped to  the bathroom with assistant nurse. When he got there, he began shaking and  his eyes did roll back and he stuck his tongue out. There was some loss of  conscious for about two minutes and [name] treated the patient with IV  Ativan and Depakote. It was not clear whether this represented a true  seizure or a so called pseudoseizure. He had no other seizures in the  hospital according to the progress notes.   IMPRESSION: 1.  History of complex partial seizures,  possibly secondary to head trauma.      They were intractable, code 345.1.  2.  Status post closed head trauma as a teenager.  3.  Chronic obstructive pulmonary disease,  496.0.  4.  Depression, code 311.0.  5.  Coronary artery disease, code 429.2.  6.  Essential tremor, code 333.1, aggravated by Depakote, code 995.20.  7.  Organic gait disorder, code 781.2.  8.  Hyperlipidemia, code 272.4.  9.  Hypertension, code 796.2.  10. Lower back pain, code 724.4.   PLAN:  Plan at this time is to discharge the patient to the rehabilitation  service on Lipitor 40  milligrams daily, Carbamazepine 200 milligrams q.6h.,  Vasotec 5 milligrams q.12h., Depakote 500 milligrams q.i.d., phenobarbital  60 milligrams q.h.s., Zoloft 75 milligrams q.h.s. and p.r.n. is to include  Tylenol q.4h. p.r.n. pain, Senna docusate sodium q.h.s. p.r.n. pain and  Phenergan suppositories p.r.n. nausea. Patient is discharged on a dysphagia  III diet with  thin liquids to only be given by spoon. He is to be followed by the  rehabilitation service. He is to have a full recording to determine if these  are true seizures or not as an outpatient at [facility] as ordered by [name].           ______________________________  Genene Churn. Sandria Manly, M.D.     JML/MEDQ  D:  03/14/2005  T:  03/15/2005  Job:  [id number]   cc:   Alvester Morin, M.D.  Fax: [phone]

## 2010-08-28 NOTE — Discharge Summary (Signed)
[name], [name]                         ACCOUNT NO.:  [id number]   MEDICAL RECORD NO.:  [id number]                   PATIENT TYPE:  INP   LOCATION:  3729                                 FACILITY:  [facility]   PHYSICIAN:  Catherine A. Orlin Hilding, M.D.          DATE OF BIRTH:  1940/10/01   DATE OF ADMISSION:  11/14/2003  DATE OF DISCHARGE:  11/17/2003                                 DISCHARGE SUMMARY   ADMITTING DIAGNOSIS:  Seizures, plus or minus pseudoseizures.   DISCHARGE DIAGNOSIS:  Seizures, plus or minus pseudoseizures.   MEDICATIONS ON DISCHARGE:  1. Tegretol 200 mg t.i.d.  2. Phenobarbital 60 mg q.h.s.  3. Zoloft 75 mg daily.  4. Vasotec 5 mg b.i.d.  5. Zocor 20 mg daily.   PAIN MANAGEMENT:  Not applicable.   DISCHARGE ACTIVITY:  No driving.   DISCHARGE DIET:  No restrictions.   WOUND CARE:  Not applicable.   FOLLOWUP:  With Dr. Sharene Skeans, call [phone] to make an appointment.   STUDIES PERFORMED:  1. An EEG.  I do not find the report of that at the time of discharge,     interpreted by Dr. Porfirio Mylar Dohmeier.  The EEG was interpreted as normal,     possibly some left-sided slowing, but no seizure activity was seen.  2. An MRI of the brain showed no acute abnormality, no significant change     from previous study, with a few small white matter hyperintensities most     compatible with chronic infarct.   LABORATORY DATA:  Included a Dilantin level of 7.1, carbamazepine is 7.0,  normal CBC with differential.  Essentially normal __________ .  Glucose was  108, pCO2 was 33.4.   He also had a chest x-ray showed some left atelectasis.  There was a  question of nodular density over the peripheral mid right lung.  A CT scan  was recommended as possibly maybe indicated to exclude an underlying lesion.   </   HOSPITAL COURSE:  Please refer to the history and physical for details.  This is a 70 year old with a history of possible seizures versus  pseudoseizures, followed by  Dr. Sharene Skeans, generally speaking, who has been  in and out of the hospital.  He was on Dilantin for a while and had more  seizures.  He was switched to Tegretol.  He may have as many as 27 in 47  days.  On the day of admission, he was supposed to have had 20 blank stares  with right-hand tremor.  However, after being admitted to the emergency  room, he had none for two days.  He was given 1 mg of Ativan, a couple of  doses of Valium en route.  He had an MRI and EEG as reported.  He had  adjustment made in his Zoloft after being seen by psych, and phenobarbital  was added to Tegretol due to cost considerations.  He continued to  improve  to a normal baseline in the hospital without further seizures, and is  discharged in improved condition with the above medications and  restrictions.                                                Catherine A. Orlin Hilding, M.D.    CAW/MEDQ  D:  11/17/2003  T:  11/18/2003  Job:  [id number]

## 2010-08-28 NOTE — Procedures (Signed)
CLINICAL COURSE:  The patient is a 70 year old gentleman with chronic  posturing of his left side, eyes deviated to the left associated with  generalized tonic-clonic jerking.  This is being done to look for the  presence of seizures and also to look for change in comparison with the  April 18 study.   DESCRIPTION OF PROCEDURE:  The tracing is carried on a 32 channel digital  ___________ recorder reformatted into 16 channel montages with ____________  EKG.  The patient was awake during the recording. He takes Dilantin.  The  international 10 to 20 system of lead placement was used.   FINDINGS:  Dominant frequency is in 8 Hz rhythmic 15 microvolt activity that  poorly attenuates with eye opening.   Background activity consisted of mixed frequency predominant alpha and upper  theta range activity with theta range components as well as 6-7 Hz and  broadly distributed firmly predominant beta range activity under 10  microvolts.   There was no focal filling in the background. There was no interictal  epileptiform form activity in the form of spikes of sharp waves.  EKG showed  a regular sinus rhythm with ventricular response of 90 beats per minute.   IMPRESSION:  Abnormal EEG on the basis of mild diffuse background slowing.  This is a nonspecific indicator of neuronal dysfunction maybe on a primary  degenerative basis or secondary to a variety of toxic or metabolic  etiologies. In this case, the patient was drowsy throughout much of the  record. The background seemed slightly slower than previously but at its  best the records were very similar. No seizure activity or focality was  seen.    WILLIAM H. Sharene Skeans, M.D.   OZH:YQMV  D:  08/08/2003 19:20:21  T:  08/08/2003 20:24:41  Job #:  [id number]

## 2010-08-28 NOTE — Procedures (Signed)
The patient is a patient who has a history of possible seizures, but as far  as I understand, it was difficult to prove if these were truly epileptic  seizures in the past. The patient has a history of ___________ and it was  after this possible brain trauma that his seizure disorder began. He has  failed so far, being on Keppra and phenobarbital. He has so far not yet been  on Lamictal or on Depakote. The patient's EEG was recorded before and during  infusion of Cerebyx, Dilantin equivalent of 1 g. The patient has continued  according to the technician to shake his right arm during the study  recording. He was responsive and follow commands. His son states that his  father became limp on the left side after he had Todd's  paralysis on the  left before ?   DESCRIPTION:  __________ was established at about 8 to 9 Hertz frequency and  appears regular bilaterally. There is frequent motion artifact which seems  to be also EKG artifact. The continued arm shaking made the recording  difficult. The technician noted sometimes the right arm to shake, then both.  During these events, there was no epileptiform discharge noted. Again, there  is asymmetry seen intermittently with right sided slowing, including frontal  and temporal regions. It was not possible to prove active epileptiform  discharges.   CONCLUSION:  Abnormal EEG due to frequent motion artifact, focal slowing,  and slow frequency artifact perhaps due to electrode impedance impairment.      UJ:WJXB  D:  03/03/2004 15:39:36  T:  03/03/2004 17:14:31  Job #:  [id number]

## 2010-08-28 NOTE — Procedures (Signed)
REFERRING PHYSICIAN:  Ara D. Tammi Klippel, M.D.   CLINICAL HISTORY:  A 70 year old gentleman with altered mental status  previously thought to be ictal stupor.  The patient is on Dilantin and has  improved.   MEDICATIONS:  1. Lovenox.  2. Vasotec.  3. Advair.  4. Zoloft.  5. Ambien.  6. Dilantin.  7. Ventolin.  8. Atenolol.  9. Ibuprofen.   STUDIES:  This is a routine EEG study performed using standard 10/20  electrode placement and 17 channels.   No clear posterior rhythm is identified.  Low frequency high amplitude of  beta activity seen throughout the recording in diffuse fashion.  No  paroxysmal epileptiform activity, spikes or sharp waves seen.  Hyperventilation results in no significant abnormalities.  Photic  stimulation is uneventful.  Length of the tracing is 22.9 minutes.  Technical component is adequate.  EKG tracing shows normal sinus rhythm.   IMPRESSION:  This electroencephalogram performed during wakeful state is  abnormal due to presence of excessive beta activity.  No definitive  epileptiform features, however, are identified.    Marney Doctor, MD   EAV:WUJW  D:  07/29/2003 17:05:06  T:  07/30/2003 09:02:55  Job #:  [id number]

## 2010-08-28 NOTE — H&P (Signed)
[name], [name]                         ACCOUNT NO.:  [id number]   MEDICAL RECORD NO.:  [id number]                   PATIENT TYPE:  INP   LOCATION:  1829                                 FACILITY:  [facility]   PHYSICIAN:  Genene Churn. Love, M.D.                 DATE OF BIRTH:  1941-04-11   DATE OF ADMISSION:  11/14/2003  DATE OF DISCHARGE:                                HISTORY & PHYSICAL   REASON FOR ADMISSION:  This is a second [facility]  admission for this 70 year old right-handed white married male from [place],  [place] admitted from the emergency room for evaluation of  seizures.   HISTORY OF PRESENT ILLNESS:  This patient presented with a history of  altered mental status in February of 2005 and was admitted by the  hospitalist, seen in consultation by Dr. Deanna Artis. Hickling.  There was a  concern that he may be in a postictal stupor.  The patient was evaluated  with MRI of the brain with and without contrast enhancement,  EEG, and  spinal tap all of which were unremarkable.  He did have an elevated lipid  profile with a cholesterol of 245 and triglycerides of 255, HDL of 32, and  LDL of 162.  His drug screen at that time was unremarkable and CSF showed no  white blood cells, no red blood cells, protein at 44, and a glucose of 63.  This CSF VDRL which was nonreactive.  CSF cryptococcal antigens were  negative and a __________ beta amyloid 42 protein were pending.   He was discharged on Tegretol after being switched from Dilantin therapy.  As an outpatient, he has had recurrent episodes thought possibly to  represents seizures, as many as 27 in 47 days, and today may have had as  many as 20 characterized by blank stare, at times a right hand tremor, at  which time he would be awake, and then one episode thought to represent a  generalized major motor seizure.   He came to the emergency room and over a three and a half hour evaluation he  had no  episodes.  It was noted that he had a therapeutic Tegretol level and  a low Dilantin level.  He received Ativan 1 mg by Dr. Wallace Cullens and previously by  ambulance had received 5 mg x 2 of Valium.   PAST MEDICAL HISTORY:  1. Significant for COPD.  2. Left hernia repair.  3. Brain concussion.  4. Hypertension.  5. Suspected seizures.  6. Depression.   MEDICATIONS:  1. Tegretol 400 mg XR 1 b.i.d.  2. Zoloft 50 mg q.d.  3. Vasotec 5 mg b.i.d.  4. Zocor 20 mg q.d.  5. Dilantin 200 mg in the morning and 100 mg in the evening.   SOCIAL HISTORY:  He finished high school.  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  He does not use cigarettes or drink alcohol.   FAMILY HISTORY:  Reveals that his mother had died at age 62 from cancer and  his father died at 44 from cancer.  He has a brother who died at 72 from  cancer.  He has a brother 25, living, and well.  He has his wife who has had  a history of seizures.  His daughters are 22 and 54 and his sons 68 and 89  and another daughter 30-all of whom are in good health.   PHYSICAL EXAMINATION:  GENERAL:  Well-developed, well-nourished who has  right hand and arm jerking voluntarily.  VITAL SIGNS:  Sitting blood pressure in the right and left arm are 130/80  and 140/80.  Heart is 64 and regular.  He is afebrile.  There were no  bruits.  NEUROLOGICAL:  He is alert.  He is oriented x 3.  He knew the president, but  not the vice president or governor.  Did not know the number of nickels in a  $1 or $1.25.  He had poor spelling.  Cranial nerve examination:  Revealed  visual fields are full with a right 7th.  Tongue midline and uvula midline.  Gags present.  Sternocleidomastoid and trapezius testing normal.  Motor  examination:  Good strength in the upper and lower extremities when he would  give an effort.  He had a waxy appearance to his motor tone.  His arm could  be placed  in one position and it would tend to stay there.  Sensory  examination:  Intact to pinprick, touch, position.  Deep tendon reflexes:  They are 1 to 2+.  Plantar responses are downgoing.  LUNGS:  Clear.  HEENT:  His tympanic membranes were clear.  He was edentulous.  HEART:  No murmurs.  ABDOMEN:  Bowel sounds were normal.   LABORATORY DATA:  Hemoglobin was 15.0, hematocrit 44, capillary blood  glucose was 108.  Sodium is 140, potassium is 3.7, chloride 108, CO2 content  24, BUN 17, and glucose 108.   IMPRESSION:  1. Suspect nonepileptic seizures, code 345.4.  2. Depression, code 311.  3. Hypertension, code 796.2.  4. History of hyperlipidemia, code 272.4.   PLAN:  Plan at this time is to evaluate the patient with EEG and decrease  his Tegretol to 100 mg t.i.d. and hold Dilantin at this point.                                                Genene Churn. Sandria Manly, M.D.    JML/MEDQ  D:  11/14/2003  T:  11/15/2003  Job:  [id number]

## 2010-08-28 NOTE — Procedures (Signed)
EEG NUMBER:  [id number]   HISTORY OF PRESENT ILLNESS:  This is a 70 year old patient with history of  seizures who has had problems with confusion and lethargy, seizure events.  The patient is being admitted for evaluation and control of his seizures  events.   This is a portable EEG recording.  No skull defects were noted.  Medications  included phenobarbital, Vasotec, Lipitor, Zoloft and Carbamazepine and  Valproic acid.   EEG CLASSIFICATION:  Normal awake.   DESCRIPTION OF RECORDING OF THE BACKGROUND RHYTHM:  This recording consisted  of a fairly well modulated medium amplitude alpha rhythm at 9 Hertz.  He has  reaction to eye opening and closure as the record progresses.  Initial  portions of the recording are filled with what looks like an electrical  artifact that is seen most prominently in the right hemisphere than the  left, but this is eliminated with filtering and electrode manipulation.  The  patient spends the rest of the recording in the waking state.  Photic  stimulation and hyperventilation is not performed.  At no time during the  recording does there appear to be evidence of spikes, spike wave discharges  or focal slowing.  EKG monitor shows no evidence of cardiac rhythm  abnormalities with heart rate of 66.   IMPRESSION:  This is a normal EEG recording in the waking state.  No  evidence of ictal or inner ictal discharges were seen.      Marlan Palau, M.D.  Electronically Signed     NFA:OZHY  D:  03/09/2005 17:03:39  T:  03/10/2005 07:28:16  Job #:  [id number]

## 2010-08-28 NOTE — H&P (Signed)
NAMEABHISHEK, [name]               ACCOUNT NO.:  [id number]   MEDICAL RECORD NO.:  [id number]          PATIENT TYPE:  INP   LOCATION:  NA                           FACILITY:  [facility]   PHYSICIAN:  Deanna Artis. Hickling, M.D.DATE OF BIRTH:  08-31-1940   DATE OF ADMISSION:  03/09/2005  DATE OF DISCHARGE:                                HISTORY & PHYSICAL   CHIEF COMPLAINT:  Recurrent seizures.   HISTORY OF PRESENT ILLNESS:  [name] is a 70 year old right-handed  married Caucasian gentleman who presents to the office today with several  witnessed complex partial seizures which continued despite 500 mg IV  Depakote.   The patient has been on multiple treatment regimens in the past and has  intermittent control of his seizures.  His wife says that he has had  increasing tremors over the past month.  Along with that, there have been  episodes of unresponsive staring spells.  I think that she is aware when he  is having seizures, but for reasons that are unclear to me, the family does  not always call.   The patient has not recently had any generalized tonic clonic events.   PAST MEDICAL HISTORY:  Remarkable for essential tremor worsened by Depakote,  organic gait disorder, dyslipidemia, hypertension, low back pain, chronic  obstructive pulmonary disease, depression, closed head injury as a teenager,  left clavicular fracture.   PAST SURGICAL HISTORY:  Inguinal herniorrhaphy.   REVIEW OF SYSTEMS:  Negative except as noted above.   MEDICATIONS:  1.  Carbamazepine 200 mg t.i.d.  2.  Depakote 500 mg q.i.d.  3.  Lipitor 40 mg in the evening.  4.  Vasotec 5 mg twice daily.  5.  Zoloft 75 mg in the evening.  6.  Phenobarbital 64.8 mg per day.   ALLERGIES:  PENICILLIN.   INTOLERANCES:  Keppra.   SOCIAL HISTORY:  The patient lives with his wife.  His daughter lives across  the street.  His wife just had a triple bypass and is having difficulty  caring for him.  There are other  children who live in the area.  The patient  quit smoking cigarettes in February of 2005 and does not drink alcohol.   FAMILY HISTORY:  Mother died at age 56 of chronic obstructive pulmonary  disease.  Father died at age 61 of lung cancer.  Brother at age 19 died of  pancreatic cancer.  The patient has a brother and sister who are alive and  well and three children who are alive and well.   PHYSICAL EXAMINATION:  GENERAL:  Thin, elderly gentleman in no acute  distress.  VITAL SIGNS:  Not available at this time. No signs of infection.  LUNGS:  Clear.  HEART:  No murmurs.  ABDOMEN:  Soft.  Bowel sounds normal.  EXTREMITIES:  Thin with no lesions.  MENTAL STATUS:  The patient is intermittently conversational and  unresponsive.  NEUROLOGY:  Visual fields were full to objects brought in from the  periphery.  Symmetric facial strength and midline tongue.  Motor  examination; the patient had normal strength,  normal functional strength,  essential tremor.  Sensation; withdrawal x4.  I could not test  stereoagnosis.  Cerebellar; the patient's essential tremor obscures presence  or absence of an intentional tremor.  Deep tendon reflexes were diminished.   IMPRESSION:  1.  Recurrent seizures, complex partial in nature, intractable.  345.41  2.  Status post closed head injury as a teenager which may be the cause of      this.  The patient was previously admitted on November 22 through      March 07, 2004.  MRI at that time was negative.  EEG showed possibly      some slowing on the left side, but no ictal lesions.   PLAN:  The patient will be admitted to the hospital and treated with IV  Cerebyx 1000 mg phenytoin equivalents.  I would consider placing him on  Topamax to see if we can control seizures and get rid of the essential  tremor.  We will discuss that over the next day or so.  I am concerned about  getting rid of the Depakote because it has done a fairly good job of  controlling  his seizures as best we know.   In addition to Cerebyx, we will evaluate the patient with an EEG, continue  home medications, and check basic laboratories including CBC, comprehensive  metabolic panel, and trough, carbamazepine and valproic acid levels.      Deanna Artis. Sharene Skeans, M.D.  Electronically Signed     WHH/MEDQ  D:  03/09/2005  T:  03/09/2005  Job:  [id number]   cc:   Alvester Morin, M.D.  Fax: [phone]

## 2010-08-28 NOTE — Consult Note (Signed)
NAMEYERIK, ZERINGUE                         ACCOUNT NO.:  [id number]   MEDICAL RECORD NO.:  [id number]                   PATIENT TYPE:  INP   LOCATION:  1825                                 FACILITY:  [facility]   PHYSICIAN:  Deanna Artis. Sharene Skeans, M.D.           DATE OF BIRTH:  Jan 28, 1941   DATE OF CONSULTATION:  07/27/2003  DATE OF DISCHARGE:                                   CONSULTATION   CHIEF COMPLAINT:  Altered mental status.   HISTORY OF PRESENT CONDITION:  The patient is a 70 year old married  Caucasian right-handed gentleman who had rather sudden onset in early  February of altered mental status.  He went from being normal ambulatory  person with low-average cognitive skills to a very confused person who was  shuffling within the space of a day.   The patient has had slow deterioration in his gait since that time, and his  language has become even more jumbled, particularly involving his ability to  understand what is said to him and to express himself.   The patient was hospitalized at [facility] for three days.  CT scan of the brain, which I have reviewed, reveals only a few small areas  of increased T2 and flare signal.  There were no abnormalities in diffusion-  weighted imaging, none in contrast studies, and his MRA of the intracranial  vessels was pristine.   The patient was seen by a psychiatrist at that time and was diagnosed with  depression, placed on an SSRI.  In March, the patient went to [facility] where he was seen by a neurologist who carried out an EEG which  was negative.  She told the family that she was not able to find anything  and later called them to ask for the MRI that had been done in February and  second MRI that had been done in March.  The patient's wife became angry and  refused to send them.   The patient has been shuffling around the house, barely getting around.  This morning he was unable to walk at all.  The  family brought him into the  emergency room around 2 p.m.  I was called to see him around 9.  Decision  was made to admit him because he could no longer be cared for at home and  also for further diagnostic workup.   PAST MEDICAL HISTORY:  1. Hypertension.  2. Chronic obstructive pulmonary disease.  3. Chronic low back pain.   PAST SURGICAL HISTORY:  1. Left herniorrhaphy.  2. Left clavicular fracture.   CURRENT MEDICATIONS:  1. Enalapril 5 mg b.i.d.  2. Zoloft 50 mg per day.  3. Advair 250/50.  4. Albuterol inhaler.   DRUG ALLERGIES:  PENICILLIN.   FAMILY HISTORY:  Mother died at age 76, COPD.  Father died at age 21, lung  cancer.  The patient had a sister die at age  60 of pancreatic cancer.  There  are several other siblings who are in good health.   SOCIAL HISTORY:  The patient has greater than 100-pack-years history of  smoking.  No use of alcohol or drugs.  He live in [place] with his wife.  He is retired.  She has always done the family finances.   REVIEW OF SYSTEMS:  Unremarkable for infectious complaints.  The patient has  had a chronic vertex headache that has been treated with a variety of  different medications with inconsistent relief.  Twelve system review is  other wise negative.   PHYSICAL EXAMINATION:  GENERAL:  On examination today, this is a pleasant  gentleman lying in bed confused.  VITAL SIGNS:  Temperature 97.1, blood pressure 169/106, resting pulse 77,  respirations 22, pulse oximetry 99% on room air.  HEENT:  Ear, nose, and throat: No infections.  NECK: Supple, no bruits.  LUNGS:  Clear.  HEART:  No murmurs, pulses normal.  ABDOMEN:  Soft, slightly tender, bowel sounds normal.  No  hepatosplenomegaly.  EXTREMITIES:  No edema. There are some chronic venous changes.  NEUROLOGIC:  The patient is easily distractible.  He makes eye contact  rarely.  He has frequent smacking of his lips, some eye blinking, and  twitching of the left corner of his  mouth.  He was able to name some objects  and recall his wife's name.  He had some social conversation.  He is totally  disoriented.  He has very poor memory and dyscalculia.  Cranial Nerves:  Round, reactive pupils, fundi normal, visual fields full to counting  fingers.  He does not always look at the objects.  Sometimes he touched my  fingers in order to count them.  Extraocular movements were full, sometimes  conjugate.  At other times they were superiorly but dysconjugately deviated  to the right or left.  The patient has a right central seventh or appears to  have a central seventh.  He also has some twitching of the left corner of  his mouth.  He has a midline tongue.  He is able to hear conversations and  respond to them to some degree.  Motor examination shows ability to lift all  four extremities.  He grips equally.  He seems to be a bit weaker in the  left arm, but he shows very good fine motor movements on the right and the  left when he is performing stereoagnosis.  Sensory examination shows  withdrawal of his right arm and both legs to noxious stimuli but does not  turn from pinching either earlobe or his left arm.  Stereoagnosis was  present in both hands for large objects.  Cerebellar examination and gait  could not be tested. Reflexes were diminished to absent.  His toes showed  withdrawal.  He had no frontal lobe release signs.   IMPRESSION:  Dementia (294.9), delirium (293.0).  This is a very unusual  case.  The patient seemed to have ery sudden onset of altered mental status  in February and then a slow gradual decline since that time.  This certainly  would make one wonder about the possibility of a cerebrovascular accident,  but the MRI scan does not support that.   Given that there have been some new focal neurologic deficits involving the  right corner of the mouth and the left arm since last done in March, it seems reasonable to have a third MRI done so that we can  compare  MRIs  looking for any change.   In addition, I think we need to be concerned about the possibility of ictal  stupor, which is a continuous form of complex partial seizures.  I plan to  treat that with IV Dilantin tonight.  If there is no change in the patient,  he will have a lumbar puncture under fluoroscopy looking for infection,  Alzheimer's, and Creutzfeldt-Jakob disease as well as some form of  underlying inflammatory condition.   MRI of the brain without and with contrast will also be done.  At that time,  we will have to consider what other tests should be done.  Workup for  treatable causes of dementia has already ordered.   I appreciate the opportunity to participate in the patient's care.  If you  have questions or I can be of assistance, do not hesitate to contact me.                                               Deanna Artis. Sharene Skeans, M.D.    [facility]  D:  07/27/2003  T:  07/29/2003  Job:  [id number]   cc:   Ara D. Tammi Klippel, M.D.  Fax: [phone]

## 2010-08-28 NOTE — H&P (Signed)
NAME:  [name], [name]                         ACCOUNT NO.:  [id number]   MEDICAL RECORD NO.:  [id number]                   PATIENT TYPE:  INP   LOCATION:  1825                                 FACILITY:  [facility]   PHYSICIAN:  Ara D. Tammi Klippel, M.D.                DATE OF BIRTH:  [birth year]   DATE OF ADMISSION:  07/27/2003  DATE OF DISCHARGE:                                HISTORY & PHYSICAL   PRIMARY CARE PHYSICIAN:  Cristi Loron, P.A., [place].   CHIEF COMPLAINT:  Increased confusion and weakness.   HISTORY OF PRESENT ILLNESS:  The patient is a 70 year old white male with no  significant past medical history who was in his usual state of health in the  beginning of the 2005 calendar year when on May 18, 2003 the patient's  wife relates a history of what appears to be an acute exacerbation of COPD.  The patient was taken to his primary care office and then the following two  days prior the patient was out of it. In particular, he was asked to see  his parents who had been dead for quite some time. He was taken back to the  doctor's office at which point EMS was summoned, and he was taken to  [facility] where he underwent a three-day  hospitalization. There, a variety of tests were performed, and no specific  neurologic diagnosis was given; however, he was seen by a psychiatrist who  felt that he was depressed, and the patient was treated with a low of  Sertraline. The patient then saw a neurologist in [place] in which an EEG  was performed which was as by report unremarkable. The patient's symptoms  continued, and he had a repeat MRI at the end of March 2005 which per report  showed no significant change from the February 2005 MRI. The patient's  condition has gradually worsened where he has gotten to the point where he  is unable to walk or even sit up at this time. The patient is unable to  answer questions, so much of his history is taken from the  wife and the  brother who are in the emergency room at this time. The only other  abnormality aside from his weakness and fluctuating mental status is one  where the patient is having hallucinations in which he is seeing nurses and  other people in the room when he is at home.   ALLERGIES:  PENICILLIN.   MEDICATIONS:  1. Albuterol metered dose inhaler one puff q.6h.  2. Enalapril 5 mg p.o. b.i.d.  3. Fluticasone/salmeterol 250/50 mcg one puff b.i.d.  4. Sertraline 50 mg p.o. q.d.   PAST MEDICAL HISTORY:  1. COPD.  2. Hypertension.  3. Chronic low back pain.  4. History of concussive head injury as a teenager.   PAST SURGICAL HISTORY:  1. Status post left herniorrhaphy as a teenager.  2. Status post left clavicular fracture.   SOCIAL HISTORY:  The patient has greater than 100-pack-year smoking history.  The patient denies any alcohol or illicit or IV drug use. He currently lives  in [place], [place], with his wife. He is currently unemployed. 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 There  is no travel history. They have one dog, and they have well water.   FAMILY HISTORY:  Mother deceased age 41 from complications of COPD. Father  deceased age 8 from lung cancer. Both parents were smokers. He has a son  alive age 83, healthy. Daughter alive age 56, healthy. Daughter alive age 33  who his healthy. Brother alive age 57 who is healthy. Sister alive age 51  who is healthy. Brother deceased at age 69 from pancreatic cancer who was  not a smoker.   REVIEW OF SYSTEMS:  Unable to be obtained from the patient as he is unable  to respond to questions intelligently. The family does admit to some weight  loss, inability to walk, chronic headaches, and low back pain as well as  hallucinations.   PHYSICAL EXAMINATION:  VITAL SIGNS:  Temperature 97.1, pulse 77,  respirations 22, BP 169/106, pulse oximetry of 99%. The patient does not  appear to be  in pain.  GENERAL:  Well-developed, well-nourished, 70 year old white male laying in  bed who appears somewhat chronically ill.  HEENT:  Head:  Normocephalic, atraumatic without alopecia. Eyes:  Pupils are  equal, round, and reactive to light. Patient unable to cooperative with  extraocular muscle testing. Anicteric, no injection, no discharge, normal  appearing conjunctivae. Ears:  TMs clear bilaterally. Nose:  No dried blood  or necrotic debris within the nares. Mouth:  Moist mucous membranes. He is  edentulous. Uvula midline. Oropharynx without erythema or exudate.  NECK:  Supple without lymphadenopathy, thyromegaly, bruit, or JVD. There are  no meningeal signs appreciated.  LUNGS:  Clear to percussion and auscultation bilaterally without rales,  rhonchi, or wheezes.  HEART:  Regular rate and rhythm, S1 and S2 without rubs, murmurs, or  gallops.  CHEST:  The patient does have funnel chest and displaced left clavicle  consistent with a reported history of fracture.  ABDOMEN:  Nondistended, bowel sounds present, nontender. No guarding or  rebound. No hepatosplenomegaly appreciated.  EXTREMITIES:  There is no clubbing, cyanosis, or edema.  DERMATOLOGIC:  There were rashes, bleeds, petechiae, or purpura.  NEUROLOGICAL:  Unable to assess cranial nerves secondary to patient  compliance. Bicipital and patellar reflexes are 2/4 and brisk bilaterally.  There is the appearance of some right sided neglect and possibly tongue  deviation to the left. Strength is 2/5 bilaterally. The patient is unable to  walk, and gait could not be assessed as he is barely able to stand.   LABORATORY DATA:  White blood cells 8.8, H and H of 15.9/44.5 with platelet  count of 232, MCV 86.4. Sodium 140, potassium 3.6, chloride 109, carbon  dioxide 24, BUN 13, creatinine 0.6, glucose 88. Total bilirubin 0.9, direct  bilirubin 0.2, alkaline phosphatase 55, AST 26, ALT 30, total protein 6.6, albumin 4.0, calcium 9.2.  Urine drug screen for amphetamines, barbiturates,  benzodiazepines, cocaine, opiates, and THC are all not detected. Alcohol  level is less than 5. Urinalysis is totally unremarkable. CT scan of the  head is pending. Review of the MRI with radiology showed no acute  abnormalities from the one dated February 2005.   ASSESSMENT/PLAN:  A 70 year old white male with progressive neurologic  decline, COPD, and hypertension.   1. Neuropsych. There are no signs or symptoms of meningitis, encephalitis,     or new onset CVA. It is unclear as to the cause of his neurologic     decline. We are attempting to get records from [facility]; however, we have been told that we not be able to get them until     Monday as they are closed for the weekend. We have reviewed the MRI and     the MRA dated from February 2005. There appears to be no vasculopathy,     large strokes, or signs consistent with normal pressure hydrocephalus.     The brain appeared in normal size for a 70 year old male. At this time,     with the report of a second repeat MRI which has been normal, we will     defer on repeating this for now. Other differentials included with the     hallucinations include Lewy body disease as well as prion disease     considering the rapid nature of this. We will discuss the case with     neurology about the appropriateness for a lumbar puncture. We will also     check a RPR, B12, and a TSH for reversible causes of dementia, although     presumably these have already been done.  2. Pulmonary. We will continue with the patient's inhalers. The family     reports that he has not had any cigarettes since this occurred, so we     will defer on the nicotine patch for now.  3. Cardiovascular. Will start the patient on aspirin. Continue him on his     enalapril and up titrate as necessary.  4. Renal. No active issues.  5. Gastrointestinal. No active issues.  6. Fluids, electrolytes,  and nutrition. Since he is still able to eat, we     will med-lock his IV fluids. We will monitor his potassium and magnesium.     The patient will be on a regular diet. He will also be evaluated by     speech pathology for possible swallowing issues.  7. Infectious disease. No active issues.  8. Hematology/oncology. No active issues.  9. Endocrine. Will check a TSH and an A1c.  10.      Prophylaxis. Will write the patient for low molecular weight     heparin for DVT prophylaxis. He will be full p.o. for GI prophylaxis.  11.      Disposition. I have relayed my concerns to the family that I do not     feel that this is a primary psychiatric cause but a rapidly progressive     neurodegenerative disorder for which we do not yet have the diagnosis.     The family states that the patient has had a living will made up. I have     instructed them to talk about code status and placement issues should the     patient condition worsen. For now, the patient is a full code.                                                Ara D. Tammi Klippel, M.D.    ADM/MEDQ  D:  07/27/2003  T:  07/29/2003  Job:  [id number]

## 2011-01-19 LAB — BASIC METABOLIC PANEL
BUN: 15
CO2: 22
Calcium: 9
Chloride: 105
Creatinine, Ser: 0.71
GFR calc Af Amer: >60
Potassium: 3.8
Sodium: 139

## 2011-01-19 LAB — DIFFERENTIAL
Basophils Absolute: 0.1
Basophils Relative: 1
Eosinophils Absolute: 0.2
Eosinophils Relative: 2
Lymphocytes Relative: 12
Lymphs Abs: 1.5
Monocytes Absolute: 1 — ABNORMAL HIGH
Monocytes Relative: 8
Neutro Abs: 9.2 — ABNORMAL HIGH
Neutrophils Relative %: 77

## 2011-01-19 LAB — CBC
HCT: 43.1
Hemoglobin: 14.9
MCHC: 34.7
MCV: 87.9
Platelets: 180
RBC: 4.9
RDW: 13.1
WBC: 12 — ABNORMAL HIGH

## 2011-01-19 LAB — D-DIMER, QUANTITATIVE: D-Dimer, Quant: 0.62 — ABNORMAL HIGH

## 2011-01-19 LAB — BASIC METABOLIC PANEL WITH GFR
GFR calc non Af Amer: >60
Glucose, Bld: 117 — ABNORMAL HIGH

## 2012-10-10 ENCOUNTER — Emergency Department: Payer: Self-pay | Admitting: Emergency Medicine

## 2012-10-10 LAB — BASIC METABOLIC PANEL
BUN: 8 mg/dL (ref 7–18)
Chloride: 102 mmol/L (ref 98–107)
Co2: 27 mmol/L (ref 21–32)
Creatinine: 0.89 mg/dL (ref 0.60–1.30)
EGFR (Non-African Amer.): >60
Glucose: 142 mg/dL — ABNORMAL HIGH (ref 65–99)
Potassium: 3.6 mmol/L (ref 3.5–5.1)
Sodium: 137 mmol/L (ref 136–145)

## 2012-10-10 LAB — CBC
HCT: 49 % (ref 40.0–52.0)
HGB: 16.7 g/dL (ref 13.0–18.0)
MCHC: 34.2 g/dL (ref 32.0–36.0)
Platelet: 232 10*3/uL (ref 150–440)
RDW: 13.9 % (ref 11.5–14.5)

## 2012-10-10 LAB — CK TOTAL AND CKMB (NOT AT ARMC): CK, Total: 88 U/L (ref 35–232)

## 2012-10-11 LAB — CK TOTAL AND CKMB (NOT AT ARMC): CK-MB: 4.8 ng/mL — ABNORMAL HIGH (ref 0.5–3.6)

## 2012-10-11 LAB — TROPONIN I: Troponin-I: <0.02 ng/mL

## 2013-05-27 ENCOUNTER — Emergency Department: Payer: Self-pay | Admitting: Emergency Medicine

## 2013-06-01 ENCOUNTER — Inpatient Hospital Stay: Payer: Self-pay | Admitting: Internal Medicine

## 2013-06-01 LAB — CBC
HCT: 55.6 % — ABNORMAL HIGH (ref 40.0–52.0)
HGB: 18.4 g/dL — AB (ref 13.0–18.0)
MCH: 27.6 pg (ref 26.0–34.0)
MCHC: 33 g/dL (ref 32.0–36.0)
MCV: 84 fL (ref 80–100)
Platelet: 300 10*3/uL (ref 150–440)
RBC: 6.66 10*6/uL — AB (ref 4.40–5.90)
RDW: 13.6 % (ref 11.5–14.5)
WBC: 11.2 10*3/uL — AB (ref 3.8–10.6)

## 2013-06-01 LAB — BASIC METABOLIC PANEL
ANION GAP: 10 (ref 7–16)
BUN: 32 mg/dL — ABNORMAL HIGH (ref 7–18)
CALCIUM: 9 mg/dL (ref 8.5–10.1)
Chloride: 100 mmol/L (ref 98–107)
Co2: 27 mmol/L (ref 21–32)
Creatinine: 1.01 mg/dL (ref 0.60–1.30)
EGFR (Non-African Amer.): >60
GLUCOSE: 167 mg/dL — AB (ref 65–99)
Osmolality: 285 (ref 275–301)
Potassium: 3.8 mmol/L (ref 3.5–5.1)
Sodium: 137 mmol/L (ref 136–145)

## 2013-06-01 LAB — TROPONIN I: Troponin-I: <0.02 ng/mL

## 2013-06-02 LAB — CK-MB
CK-MB: 3.2 ng/mL (ref 0.5–3.6)
CK-MB: 2.7 ng/mL (ref 0.5–3.6)
CK-MB: 3.2 ng/mL (ref 0.5–3.6)

## 2013-06-02 LAB — CBC WITH DIFFERENTIAL/PLATELET
Basophil #: 0 10*3/uL (ref 0.0–0.1)
Basophil %: 0.2 %
Eosinophil #: 0 10*3/uL (ref 0.0–0.7)
Eosinophil %: 0 %
HCT: 49.9 % (ref 40.0–52.0)
HGB: 17 g/dL (ref 13.0–18.0)
LYMPHS ABS: 1 10*3/uL (ref 1.0–3.6)
LYMPHS PCT: 15.7 %
MCH: 28.1 pg (ref 26.0–34.0)
MCHC: 34 g/dL (ref 32.0–36.0)
MCV: 83 fL (ref 80–100)
MONO ABS: 0.2 x10 3/mm (ref 0.2–1.0)
MONOS PCT: 2.6 %
NEUTROS ABS: 5.4 10*3/uL (ref 1.4–6.5)
NEUTROS PCT: 81.5 %
Platelet: 202 10*3/uL (ref 150–440)
RBC: 6.05 10*6/uL — AB (ref 4.40–5.90)
RDW: 13.3 % (ref 11.5–14.5)
WBC: 6.7 10*3/uL (ref 3.8–10.6)

## 2013-06-02 LAB — BASIC METABOLIC PANEL
ANION GAP: 8 (ref 7–16)
BUN: 29 mg/dL — AB (ref 7–18)
CALCIUM: 8.8 mg/dL (ref 8.5–10.1)
CO2: 28 mmol/L (ref 21–32)
Chloride: 102 mmol/L (ref 98–107)
Creatinine: 0.89 mg/dL (ref 0.60–1.30)
EGFR (African American): >60
Glucose: 165 mg/dL — ABNORMAL HIGH (ref 65–99)
Osmolality: 285 (ref 275–301)
POTASSIUM: 3.7 mmol/L (ref 3.5–5.1)
SODIUM: 138 mmol/L (ref 136–145)

## 2013-06-02 LAB — TROPONIN I
Troponin-I: <0.02 ng/mL
Troponin-I: <0.02 ng/mL

## 2013-06-02 LAB — MAGNESIUM: MAGNESIUM: 1.9 mg/dL

## 2013-06-02 LAB — POTASSIUM: Potassium: 4.3 mmol/L (ref 3.5–5.1)

## 2013-06-11 ENCOUNTER — Emergency Department: Payer: Self-pay | Admitting: Emergency Medicine

## 2013-06-11 LAB — COMPREHENSIVE METABOLIC PANEL
ALBUMIN: 3.4 g/dL (ref 3.4–5.0)
ANION GAP: 8 (ref 7–16)
AST: 43 U/L — AB (ref 15–37)
Alkaline Phosphatase: 121 U/L — ABNORMAL HIGH
BUN: 43 mg/dL — ABNORMAL HIGH (ref 7–18)
Bilirubin,Total: 1.5 mg/dL — ABNORMAL HIGH (ref 0.2–1.0)
CHLORIDE: 99 mmol/L (ref 98–107)
CREATININE: 1.6 mg/dL — AB (ref 0.60–1.30)
Calcium, Total: 8.9 mg/dL (ref 8.5–10.1)
Co2: 26 mmol/L (ref 21–32)
EGFR (African American): 50 — ABNORMAL LOW
GFR CALC NON AF AMER: 43 — AB
Glucose: 187 mg/dL — ABNORMAL HIGH (ref 65–99)
OSMOLALITY: 282 (ref 275–301)
Potassium: 4.3 mmol/L (ref 3.5–5.1)
SGPT (ALT): 48 U/L (ref 12–78)
SODIUM: 133 mmol/L — AB (ref 136–145)
Total Protein: 7 g/dL (ref 6.4–8.2)

## 2013-06-11 LAB — CBC
HCT: 52.1 % — AB (ref 40.0–52.0)
HGB: 17.4 g/dL (ref 13.0–18.0)
MCH: 28.2 pg (ref 26.0–34.0)
MCHC: 33.4 g/dL (ref 32.0–36.0)
MCV: 84 fL (ref 80–100)
Platelet: 307 10*3/uL (ref 150–440)
RBC: 6.18 10*6/uL — ABNORMAL HIGH (ref 4.40–5.90)
RDW: 13.8 % (ref 11.5–14.5)
WBC: 19.2 10*3/uL — AB (ref 3.8–10.6)

## 2013-06-11 LAB — TROPONIN I: Troponin-I: <0.02 ng/mL

## 2013-06-12 LAB — URINALYSIS, COMPLETE
BACTERIA: NONE SEEN
Bilirubin,UR: NEGATIVE
Blood: NEGATIVE
Granular Cast: 3
Hyaline Cast: 13
KETONE: NEGATIVE
Leukocyte Esterase: NEGATIVE
Nitrite: NEGATIVE
PH: 5 (ref 4.5–8.0)
RBC,UR: <1 /HPF (ref 0–5)
Specific Gravity: 1.018 (ref 1.003–1.030)
WBC UR: 4 /HPF (ref 0–5)

## 2013-06-20 ENCOUNTER — Emergency Department: Payer: Self-pay | Admitting: Emergency Medicine

## 2013-06-20 LAB — URINALYSIS, COMPLETE
BLOOD: NEGATIVE
Glucose,UR: NEGATIVE mg/dL (ref 0–75)
Hyaline Cast: 14
Ketone: NEGATIVE
Leukocyte Esterase: NEGATIVE
NITRITE: NEGATIVE
PH: 5 (ref 4.5–8.0)
Protein: 30
RBC,UR: 5 /HPF (ref 0–5)
SPECIFIC GRAVITY: 1.025 (ref 1.003–1.030)

## 2013-06-20 LAB — CBC
HCT: 46.1 % (ref 40.0–52.0)
HGB: 15.9 g/dL (ref 13.0–18.0)
MCH: 29 pg (ref 26.0–34.0)
MCHC: 34.6 g/dL (ref 32.0–36.0)
MCV: 84 fL (ref 80–100)
Platelet: 215 10*3/uL (ref 150–440)
RBC: 5.5 10*6/uL (ref 4.40–5.90)
RDW: 14.2 % (ref 11.5–14.5)
WBC: 10.7 10*3/uL — AB (ref 3.8–10.6)

## 2013-06-20 LAB — COMPREHENSIVE METABOLIC PANEL
ALBUMIN: 3.2 g/dL — AB (ref 3.4–5.0)
Alkaline Phosphatase: 103 U/L
Anion Gap: 3 — ABNORMAL LOW (ref 7–16)
BUN: 19 mg/dL — ABNORMAL HIGH (ref 7–18)
Bilirubin,Total: 0.9 mg/dL (ref 0.2–1.0)
CALCIUM: 8.7 mg/dL (ref 8.5–10.1)
CHLORIDE: 105 mmol/L (ref 98–107)
CO2: 31 mmol/L (ref 21–32)
Creatinine: 1.04 mg/dL (ref 0.60–1.30)
EGFR (African American): >60
EGFR (Non-African Amer.): >60
GLUCOSE: 111 mg/dL — AB (ref 65–99)
OSMOLALITY: 280 (ref 275–301)
Potassium: 3.6 mmol/L (ref 3.5–5.1)
SGOT(AST): 18 U/L (ref 15–37)
SGPT (ALT): 23 U/L (ref 12–78)
SODIUM: 139 mmol/L (ref 136–145)
Total Protein: 6.7 g/dL (ref 6.4–8.2)

## 2013-06-20 LAB — CK TOTAL AND CKMB (NOT AT ARMC)
CK, Total: 32 U/L — ABNORMAL LOW
CK-MB: 2.3 ng/mL (ref 0.5–3.6)

## 2013-06-20 LAB — TROPONIN I

## 2014-02-19 ENCOUNTER — Ambulatory Visit: Payer: Self-pay | Admitting: Family Medicine

## 2014-03-28 ENCOUNTER — Emergency Department: Payer: Self-pay | Admitting: Emergency Medicine

## 2014-04-05 ENCOUNTER — Emergency Department: Payer: Self-pay | Admitting: Emergency Medicine

## 2014-04-05 LAB — CBC
HCT: 52.8 % — AB (ref 40.0–52.0)
HGB: 17.9 g/dL (ref 13.0–18.0)
MCH: 28.6 pg (ref 26.0–34.0)
MCHC: 33.9 g/dL (ref 32.0–36.0)
MCV: 84 fL (ref 80–100)
PLATELETS: 381 10*3/uL (ref 150–440)
RBC: 6.25 10*6/uL — AB (ref 4.40–5.90)
RDW: 14.2 % (ref 11.5–14.5)
WBC: 16.5 10*3/uL — AB (ref 3.8–10.6)

## 2014-04-05 LAB — COMPREHENSIVE METABOLIC PANEL
ALK PHOS: 95 U/L
ALT: 28 U/L
AST: 27 U/L (ref 15–37)
Albumin: 3.9 g/dL (ref 3.4–5.0)
Anion Gap: 15 (ref 7–16)
BUN: 17 mg/dL (ref 7–18)
Bilirubin,Total: 1 mg/dL (ref 0.2–1.0)
CREATININE: 1.26 mg/dL (ref 0.60–1.30)
Calcium, Total: 9 mg/dL (ref 8.5–10.1)
Chloride: 103 mmol/L (ref 98–107)
Co2: 22 mmol/L (ref 21–32)
EGFR (Non-African Amer.): 60 — ABNORMAL LOW
GLUCOSE: 204 mg/dL — AB (ref 65–99)
OSMOLALITY: 287 (ref 275–301)
POTASSIUM: 3.3 mmol/L — AB (ref 3.5–5.1)
Sodium: 140 mmol/L (ref 136–145)
TOTAL PROTEIN: 7.7 g/dL (ref 6.4–8.2)

## 2014-04-05 LAB — URINALYSIS, COMPLETE
BILIRUBIN, UR: NEGATIVE
Bacteria: NONE SEEN
Blood: NEGATIVE
LEUKOCYTE ESTERASE: NEGATIVE
Nitrite: NEGATIVE
Ph: 5 (ref 4.5–8.0)
Protein: 30
RBC,UR: 1 /HPF (ref 0–5)
SPECIFIC GRAVITY: 1.023 (ref 1.003–1.030)
SQUAMOUS EPITHELIAL: NONE SEEN

## 2014-04-05 LAB — CK TOTAL AND CKMB (NOT AT ARMC)
CK, TOTAL: 74 U/L (ref 39–308)
CK-MB: 4.5 ng/mL — AB (ref 0.5–3.6)

## 2014-04-05 LAB — TROPONIN I: Troponin-I: <0.02 ng/mL

## 2014-05-13 ENCOUNTER — Observation Stay: Payer: Self-pay | Admitting: Internal Medicine

## 2014-05-13 LAB — CBC
HCT: 46 % (ref 40.0–52.0)
HGB: 15.7 g/dL (ref 13.0–18.0)
MCH: 28.4 pg (ref 26.0–34.0)
MCHC: 34.1 g/dL (ref 32.0–36.0)
MCV: 83 fL (ref 80–100)
Platelet: 226 10*3/uL (ref 150–440)
RBC: 5.53 10*6/uL (ref 4.40–5.90)
RDW: 13.9 % (ref 11.5–14.5)
WBC: 10.1 10*3/uL (ref 3.8–10.6)

## 2014-05-13 LAB — URINALYSIS, COMPLETE
BACTERIA: NONE SEEN
BILIRUBIN, UR: NEGATIVE
BLOOD: NEGATIVE
GLUCOSE, UR: NEGATIVE mg/dL (ref 0–75)
Leukocyte Esterase: NEGATIVE
NITRITE: NEGATIVE
Ph: 7 (ref 4.5–8.0)
Protein: NEGATIVE
RBC,UR: NONE SEEN /HPF (ref 0–5)
SPECIFIC GRAVITY: 1.013 (ref 1.003–1.030)
SQUAMOUS EPITHELIAL: NONE SEEN
WBC UR: NONE SEEN /HPF (ref 0–5)

## 2014-05-13 LAB — DRUG SCREEN, URINE
Amphetamines, Ur Screen: NEGATIVE (ref ?–1000)
BENZODIAZEPINE, UR SCRN: POSITIVE (ref ?–200)
Barbiturates, Ur Screen: NEGATIVE (ref ?–200)
CANNABINOID 50 NG, UR ~~LOC~~: NEGATIVE (ref ?–50)
Cocaine Metabolite,Ur ~~LOC~~: NEGATIVE (ref ?–300)
MDMA (Ecstasy)Ur Screen: NEGATIVE (ref ?–500)
Methadone, Ur Screen: NEGATIVE (ref ?–300)
OPIATE, UR SCREEN: NEGATIVE (ref ?–300)
Phencyclidine (PCP) Ur S: NEGATIVE (ref ?–25)
Tricyclic, Ur Screen: NEGATIVE (ref ?–1000)

## 2014-05-13 LAB — MAGNESIUM: MAGNESIUM: 2.1 mg/dL

## 2014-05-13 LAB — TROPONIN I

## 2014-05-13 LAB — ACETAMINOPHEN LEVEL

## 2014-05-13 LAB — COMPREHENSIVE METABOLIC PANEL
ANION GAP: 12 (ref 7–16)
Albumin: 3.3 g/dL — ABNORMAL LOW (ref 3.4–5.0)
Alkaline Phosphatase: 115 U/L (ref 46–116)
BUN: 10 mg/dL (ref 7–18)
Bilirubin,Total: 1.1 mg/dL — ABNORMAL HIGH (ref 0.2–1.0)
CALCIUM: 8.8 mg/dL (ref 8.5–10.1)
CO2: 28 mmol/L (ref 21–32)
CREATININE: 0.98 mg/dL (ref 0.60–1.30)
Chloride: 103 mmol/L (ref 98–107)
Glucose: 158 mg/dL — ABNORMAL HIGH (ref 65–99)
OSMOLALITY: 287 (ref 275–301)
Potassium: 3.2 mmol/L — ABNORMAL LOW (ref 3.5–5.1)
SGOT(AST): 27 U/L (ref 15–37)
SGPT (ALT): 19 U/L (ref 14–63)
SODIUM: 143 mmol/L (ref 136–145)
Total Protein: 6.6 g/dL (ref 6.4–8.2)

## 2014-05-13 LAB — SALICYLATE LEVEL: Salicylates, Serum: <1.7 mg/dL

## 2014-05-13 LAB — TSH: Thyroid Stimulating Horm: 0.649 u[IU]/mL

## 2014-05-13 LAB — ETHANOL

## 2014-05-14 LAB — BASIC METABOLIC PANEL
Anion Gap: 8 (ref 7–16)
BUN: 8 mg/dL (ref 7–18)
CHLORIDE: 106 mmol/L (ref 98–107)
CO2: 29 mmol/L (ref 21–32)
Calcium, Total: 8.3 mg/dL — ABNORMAL LOW (ref 8.5–10.1)
Creatinine: 0.79 mg/dL (ref 0.60–1.30)
EGFR (Non-African Amer.): >60
Glucose: 118 mg/dL — ABNORMAL HIGH (ref 65–99)
Osmolality: 284 (ref 275–301)
Potassium: 3 mmol/L — ABNORMAL LOW (ref 3.5–5.1)
Sodium: 143 mmol/L (ref 136–145)

## 2014-05-15 LAB — BASIC METABOLIC PANEL
Anion Gap: 9 (ref 7–16)
BUN: 7 mg/dL (ref 7–18)
CALCIUM: 8 mg/dL — AB (ref 8.5–10.1)
CHLORIDE: 108 mmol/L — AB (ref 98–107)
CO2: 27 mmol/L (ref 21–32)
Creatinine: 0.69 mg/dL (ref 0.60–1.30)
EGFR (African American): >60
EGFR (Non-African Amer.): >60
Glucose: 129 mg/dL — ABNORMAL HIGH (ref 65–99)
Osmolality: 287 (ref 275–301)
POTASSIUM: 3.1 mmol/L — AB (ref 3.5–5.1)
Sodium: 144 mmol/L (ref 136–145)

## 2014-05-15 LAB — CBC WITH DIFFERENTIAL/PLATELET
BASOS PCT: 0.8 %
Basophil #: 0.1 10*3/uL (ref 0.0–0.1)
EOS PCT: 5.1 %
Eosinophil #: 0.4 10*3/uL (ref 0.0–0.7)
HCT: 38.7 % — ABNORMAL LOW (ref 40.0–52.0)
HGB: 13.1 g/dL (ref 13.0–18.0)
LYMPHS PCT: 37.9 %
Lymphocyte #: 2.7 10*3/uL (ref 1.0–3.6)
MCH: 28.6 pg (ref 26.0–34.0)
MCHC: 34 g/dL (ref 32.0–36.0)
MCV: 84 fL (ref 80–100)
MONOS PCT: 9.7 %
Monocyte #: 0.7 x10 3/mm (ref 0.2–1.0)
NEUTROS PCT: 46.5 %
Neutrophil #: 3.3 10*3/uL (ref 1.4–6.5)
Platelet: 173 10*3/uL (ref 150–440)
RBC: 4.59 10*6/uL (ref 4.40–5.90)
RDW: 13.6 % (ref 11.5–14.5)
WBC: 7 10*3/uL (ref 3.8–10.6)

## 2014-05-16 LAB — CBC WITH DIFFERENTIAL/PLATELET
Basophil #: 0.1 10*3/uL (ref 0.0–0.1)
Basophil %: 1.3 %
EOS PCT: 5.8 %
Eosinophil #: 0.4 10*3/uL (ref 0.0–0.7)
HCT: 38.3 % — ABNORMAL LOW (ref 40.0–52.0)
HGB: 13 g/dL (ref 13.0–18.0)
LYMPHS ABS: 2.3 10*3/uL (ref 1.0–3.6)
Lymphocyte %: 37.1 %
MCH: 28.7 pg (ref 26.0–34.0)
MCHC: 33.9 g/dL (ref 32.0–36.0)
MCV: 85 fL (ref 80–100)
MONO ABS: 0.5 x10 3/mm (ref 0.2–1.0)
Monocyte %: 7.9 %
Neutrophil #: 2.9 10*3/uL (ref 1.4–6.5)
Neutrophil %: 47.9 %
PLATELETS: 159 10*3/uL (ref 150–440)
RBC: 4.53 10*6/uL (ref 4.40–5.90)
RDW: 13.8 % (ref 11.5–14.5)
WBC: 6.1 10*3/uL (ref 3.8–10.6)

## 2014-05-16 LAB — BASIC METABOLIC PANEL
Anion Gap: 7 (ref 7–16)
BUN: 5 mg/dL — ABNORMAL LOW (ref 7–18)
CALCIUM: 8.2 mg/dL — AB (ref 8.5–10.1)
CHLORIDE: 111 mmol/L — AB (ref 98–107)
Co2: 28 mmol/L (ref 21–32)
Creatinine: 0.7 mg/dL (ref 0.60–1.30)
EGFR (African American): >60
EGFR (Non-African Amer.): >60
GLUCOSE: 125 mg/dL — AB (ref 65–99)
Osmolality: 289 (ref 275–301)
POTASSIUM: 4.3 mmol/L (ref 3.5–5.1)
SODIUM: 146 mmol/L — AB (ref 136–145)

## 2014-05-17 LAB — BASIC METABOLIC PANEL
Anion Gap: 8 (ref 7–16)
BUN: 7 mg/dL (ref 7–18)
CHLORIDE: 108 mmol/L — AB (ref 98–107)
Calcium, Total: 8.2 mg/dL — ABNORMAL LOW (ref 8.5–10.1)
Co2: 27 mmol/L (ref 21–32)
Creatinine: 0.7 mg/dL (ref 0.60–1.30)
EGFR (African American): >60
GLUCOSE: 158 mg/dL — AB (ref 65–99)
Osmolality: 286 (ref 275–301)
POTASSIUM: 3.7 mmol/L (ref 3.5–5.1)
SODIUM: 143 mmol/L (ref 136–145)

## 2014-05-17 LAB — HEMOGLOBIN A1C: Hemoglobin A1C: 5.5 % (ref 4.2–6.3)

## 2014-08-03 NOTE — Discharge Summary (Signed)
PATIENT NAME:  [name], [name] MR#:  [id number] DATE OF BIRTH:  [birth year]  DATE OF ADMISSION:  06/01/2013 DATE OF DISCHARGE:  06/05/2013  DISCHARGE DIAGNOSES: 1.  Chronic obstructive pulmonary disease exacerbation.  2.  Hypertension.  3.  Hyperlipidemia.  4.  Essential tremors.   DISCHARGE MEDICATIONS: 1.  Pravastatin 40 mg p.o. daily. 2.  Cymbalta 60 mg 2 capsules daily.  3.  Lisinopril 5 mg p.o. daily.  4.  Clonidine 0.1 mg p.o. b.i.d.  5.  Omeprazole 20 mg p.o. daily.  6.  Diazepam 5 mg p.o. b.i.d.  7.  Abilify 50 mg half tablet daily. 8.  Propranolol 40 mg p.o. b.i.d.  9.  Levaquin 5 mg p.o. every 24 hours.  10.   (\tussionex 5 ml po bid(100 ml prescribed) 11.  Ensure 1 can p.o. t.i.d.  12.  Prednisone 20 mg 2 tablets daily for 2 days, 1 tablet daily for 2 days and then stop.   please see the next dictation for hospital course  ____________________________ Epifanio Lesches, MD sk:dp D: 06/06/2013 11:26:22 ET T: 06/06/2013 12:39:56 ET JOB#: [id number]  cc: Epifanio Lesches, MD, <Dictator> Epifanio Lesches MD ELECTRONICALLY SIGNED 06/08/2013 11:01

## 2014-08-03 NOTE — H&P (Signed)
PATIENT NAME:  [name], [name] MR#:  [id number] DATE OF BIRTH:  Jun 29, 1940  DATE OF ADMISSION:  06/01/2013  PRIMARY CARE PHYSICIAN:  .  REFERRING PHYSICIAN:  Dr. Darl Householder.   CHIEF COMPLAINT:  Cough, shortness of breath.   HISTORY OF PRESENT ILLNESS:  [name] is a 74 year old male with a history of COPD, hypertension, depression, presented to the Emergency Department with cough, shortness of breath for the last 4 to 5 days.  Has been experiencing fever, has productive sputum.  Has been experiencing severe generalized weakness, shortness of breath.  Work-up in the Emergency Department did not show any acute abnormality, however CTA done showed enlarged right hilar lymph node about 1.2 cm, right lower lobe peribronchial thickening.   The patient received levofloxacin, breathing treatments in the Emergency Department.   PAST MEDICAL HISTORY: 1.  COPD.  2.  Hypertension.  3.  Tremors.   PAST SURGICAL HISTORY:  Hernia repair.   ALLERGIES:  PENICILLIN AND ASPIRIN.   HOME MEDICATIONS: 1.  Vitamin D 1.25 mg once a day.  2.   40 mg 2 times a day.  3.  Pravastatin 40 mg once a day.  4.  Omeprazole 20 mg once a day.  5.  Lisinopril 5 mg once a day.  6.  Duloxetine 60 mg 2 tablets once a day.  7.  Diazepam 5 mg 2 times a day as needed.  8.  Clonidine 0.1 mg 2 times a day.  9.  Abilify 0.5 mg once a day.   SOCIAL HISTORY:  Former smoker.  Denies drinking alcohol or using illicit drugs.  Married, lives with his wife.   FAMILY HISTORY:  Brother diagnosed with cancer of unknown origin.   REVIEW OF SYSTEMS: CONSTITUTIONAL:  Generalized weakness.  EYES:  No change in vision.  EARS, NOSE, THROAT:  No change in hearing.  RESPIRATORY:  Has cough, shortness of breath.  CARDIOVASCULAR:  No chest pain or palpitations.  GASTROINTESTINAL:  No nausea, vomiting, abdominal pain.  GENITOURINARY:  No dysuria.  SKIN:  No rash or lesions.   MUSCULOSKELETAL:  No joint pains and aches.  ENDOCRINE:  No  polyuria or polydipsia.  NEUROLOGIC:  No weakness or numbness in any part of the body.   PHYSICAL EXAMINATION: GENERAL:  This is a thin built, frail-looking male lying down in the bed, not in distress.  VITAL SIGNS:  Temperature 97, pulse 86, blood pressure 120/66, respiratory rate of 24, oxygen saturation is 98% on 2 liters of oxygen.  HEENT:  Head normocephalic, atraumatic.  Eyes, no scleral icterus.  Conjunctivae normal.  Pupils equal and react to light.  Extraocular movements are intact.  Mucous membranes moist.  No pharyngeal erythema.  NECK:  Supple.  No lymphadenopathy.  No JVD.  No carotid bruit.  No thyromegaly.  CHEST:  Has no focal tenderness, bilateral wheezing.  HEART:  S1, S2, regular.  No murmurs are heard.  ABDOMEN:  Bowel sounds plus.  Soft, nontender, nondistended.  EXTREMITIES:  No pedal edema.  Pulses 2+.  NEUROLOGIC:  The patient is alert, oriented to place, person and time.  Cranial nerves II through XII intact.  Motor 5 by 5 in upper and lower extremities.   LABORATORY DATA:  BMP is completely within normal limits.  CBC:  WBC of 11.2, hemoglobin 18.4, platelet count of 300.   Chest x-ray PA and lateral as mentioned above, no acute cardiopulmonary disease.  CT chest shows right lower lobe peribronchial thickening.   ASSESSMENT AND PLAN:  [name] is a 74 year old male who comes to the Emergency Department with chronic obstructive pulmonary disease exacerbation.  1.  Chronic obstructive pulmonary disease exacerbation.  Continue with the breathing treatments, Solu-Medrol and levofloxacin.  2.  Debility:  We will involve the physical therapy.  3.  Hypertension, somewhat poorly controlled.  This could be from rebound hypertension from the clonidine.  Continue the home medications.  4.  Keep the patient on deep vein thrombosis prophylaxis with Lovenox.   TIME SPENT:  45 minutes.    ____________________________ Monica Becton, MD pv:ea D: 06/01/2013 23:51:14  ET T: 06/02/2013 00:39:37 ET JOB#: [id number]  cc: Monica Becton, MD, <Dictator> Monica Becton MD ELECTRONICALLY SIGNED 06/14/2013 1:29

## 2014-08-03 NOTE — Discharge Summary (Signed)
PATIENT NAME:  [name], [name] MR#:  [id number] DATE OF BIRTH:  [birth year]  DATE OF ADMISSION:  06/01/2013 DATE OF DISCHARGE:  06/05/2013  Addendum.  This is a continuation.   HOSPITAL COURSE: This is a 74 year old male patient with history of chronic obstructive pulmonary disease, hypertension, essential tremors, comes in because of cough and shortness of breath and productive phlegm, admitted for:  1.  Chronic obstructive pulmonary disease exacerbation. The patient received IV antibiotics, along with nebulizers and steroids. The patient did receive Levaquin. Symptoms improved. Repeat chest x-ray was normal. CT chest showed a small area of lymphadenopathy in the right hilum of about 1.2 cm. The patient advised to follow up with his primary doctor, Dr. Dema Severin, regarding a possible repeat CAT scan. The patient's primary doctor is Dr. Clayborn Bigness. The patient's wife said he is going to follow up with Dr. Etta Quill practice. Advised her to have a repeat CAT scan for this patient to make sure this lymphadenopathy resolves by CT chest in about 4 to 6 weeks. The patient received prednisone to go home with, along with nebulizers. The patient also received Levaquin. The patient received Levaquin 500 mg daily for 10 days.  2. Hypertension. The patient's blood pressure is well controlled with the medications, and he is on clonidine 0.1 mg p.o. b.i.d., lisinopril 5 mg p.o. daily and propranolol 40 mg p.o. b.i.d. The patient is advised to continue them.  3.  Essential tremors. The patient is on propranolol 40 mg p.o. b.i.d.  4.  Depression.  He is on duloxetine and Abilify, continue.  5. The patient has debility, seen by physical therapy. Did not require any home physical therapy. Was able to walk. They said he can go home. The patient's O2 sats on room air were 95% at rest and also on exertion. Did not qualify for home oxygen.   DISCHARGE VITAL SIGNS: Temperature 97.3, heart rate 91, blood pressure 147/67, sats  95% on room air.   Went home in stable condition.  The patient can see the primary doctor in 1 week.  ____________________________ Epifanio Lesches, MD sk:dmm D: 06/06/2013 11:39:06 ET T: 06/06/2013 12:06:09 ET JOB#: [id number]  cc: Epifanio Lesches, MD, <Dictator> Epifanio Lesches MD ELECTRONICALLY SIGNED 06/08/2013 11:00

## 2014-08-11 NOTE — Consult Note (Signed)
Psychiatry: Follow-up on this patient with odd presentation physical symptoms and history of depression.  Medical workup has not shown any physiologic cause or indicated treatment for his current condition.  On interview today the patient says he is feeling okay.  Denies any suicidal ideation denies feeling depressed.  His appetite is good.  Patient is requesting discharge.  On review of systems denies any suicidality.  Still having shaking tremor. mental status patient is still disheveled.  His speech is clearer than yesterday.  He is alert and oriented though his thinking is still slow.  Denies suicidality.  Affect seems constricted. discussion with wife and patient they preferred to go home at this point.  Wife feels safe and comfortable with his care.  They intend to follow up with [facility] for outpatient psychiatric treatment.  I did some psychoeducation regarding my belief that his physical symptoms have a large psychosomatic component.  I mention the possibility of ECT and encourage them to bring that up with the doctors at [facility].  For now continue his Seroquel that we started and the low dose of Ativan.  Case discussed with Dr. Volanda Napoleon and with the nursing. depression NOS  Electronic Signatures: Kathe Wirick, Madie Reno (MD)  (Signed on 05-Feb-16 17:03)  Authored  Last Updated: 05-Feb-16 17:03 by Gonzella Lex (MD)

## 2014-08-11 NOTE — Discharge Summary (Signed)
PATIENT NAME:  [name], [name] MR#:  [id number] DATE OF BIRTH:  10-02-1940  DATE OF ADMISSION:  05/13/2014 DATE OF DISCHARGE:  05/17/2014  PRIMARY CARE PROVIDER: Nonlocal.  ADMITTING DIAGNOSIS: Confusion and worsening tremor.   DISCHARGE DIAGNOSES: 1.  Major depression with psychosis.  2.  Arrhythmia, likely due to Geodon.  3.  Hypokalemia.  4.  Hypertension.  5.  Chronic obstructive pulmonary disease.  6.  Anxiety and depression.  7.  Generalized weakness and loss of balance.  8.  Hypernatremia.  9.  Elevated blood sugars.   CONSULTATIONS:  1.  Dr. Lujean Amel.  2.  Dr. Alethia Berthold.  3.  Dr. Leotis Pain.   PROCEDURES: 1.  CT scan of the head without contrast on February 1st shows no acute intracranial abnormalities. Generalized atrophy.  2.  Chest x-ray, February 1st, shows stable examination. No active cardiopulmonary disease.   HISTORY OF PRESENT ILLNESS: This 74 year old Caucasian man with history of hypertension, COPD, Parkinson disease with tremor is sent from home due to above chief complaint of confusion and worsening tremor. The patient is alert, awake, oriented, and in no distress. According to the patient and his wife, he has had worsening tremor for 4 days. He has also had decreased oral intake and weakness. On presentation to the Emergency Room, he was given a dose of Geodon. The patient had fluctuations in his heart rate after administration of this medication going up to 150 and then decreasing to 50. He has been admitted for further observation of heart rate.   HOSPITAL COURSE BY PROBLEM:  1.  Arrhythmia: Fluctuations in heart rate up to 150 and then down to 50 due to Geodon. This resolved within several hours and did not recur during the admission. The patient should not be administered this medication in the future.  2.  Major depression with psychosis: Neurology was initially consulted due to the patient's tremor, which seemed to be interfering with  activities of daily living, including feeding himself and walking. CT scan of the head was negative. It does not seem that he actually has Parkinson disease. There is no chorea. The tremor is resolved with distraction. Neurology suggested that actually the tremor may be more likely related to anxiety and insomnia. At that point, psychiatry was consulted and agreed with this assessment. The patient was started on benzodiazepines, which seem to help the tremor and sleep as well as orientation. He was assessed by physical therapy and skilled nursing facility was recommended. We initiated a search for a geropsychiatric bed for this patient, but unfortunately were unable to find a bed. The patient's wife feels strongly that he will do better at home rather than in a skilled nursing facility. She feels comfortable taking him home with home health nursing, nurse's aide and physical therapy. He will need to follow up with psychiatry and has been provided with resources for this. He will also continue on lorazepam as recommended by Dr. Weber Cooks. 3.  COPD: There was no COPD exacerbation during this hospitalization. The patient remains on his home regimen, well controlled.  4.  Hypertension: Blood pressure is well controlled during hospitalization. He remains on his home regimen of lisinopril and clonidine, as well as propranolol.  5.  Electrolyte abnormalities: These were most likely due to decreased p.o. intake prior to admission and were repleted. At the time of discharge, his electrolytes are normal and he is tolerating a normal diet.   DISCHARGE PHYSICAL EXAMINATION: VITAL SIGNS: Temperature 97.5, pulse 74, respirations  18, blood pressure 136/67, oxygenation 96% on room air.  GENERAL: No acute distress.  CARDIOVASCULAR: Regular rate and rhythm. No murmurs, rubs, or gallops.  RESPIRATORY: Lungs clear to auscultation bilaterally with good air movement.  NEUROLOGIC: Cranial nerves II through XII are grossly intact.  He does have a severe tremor which resolves with distraction and with sleep. Strength is 5/5 throughout.  PSYCHIATRIC: The patient is alert. He is oriented to person, place and his family, not to date. He is anxious and would like to go home.   LABORATORY DATA: Sodium 143, potassium 3.7, chloride 108, bicarb 27, BUN 7, creatinine 0.70, glucose 158. Magnesium 2.1. Hemoglobin A1c 5.5. LFTs normal with the exception of decreased albumin at 3.3 and elevated bilirubin at 1.1. Thyroid stimulating hormone 0.649. Troponin less than 0.02. Urine drug screen positive for benzodiazepines, which had been prescribed prior to this admission. White blood cells 6.1, hemoglobin 13, platelets 159,000, MCV 85. UA is negative for signs of infection.   DISCHARGE MEDICATIONS: 1.  Pravastatin 40 mg 1 tablet daily.  2.  Lisinopril 5 mg 1 tablet daily.  3.  Clonidine 0.1 mg 1 tablet twice a day.  4.  Omeprazole 20 mg 1 tablet daily.  5.  Propranolol 40 mg 1 tablet twice a day.  6.  Vitamin D3 50,000 international units 1 capsule once a week.  7.  Quetiapine 100 mg 1 tablet once a day at bedtime.  8.  Lorazepam 0.5 mg 1 tablet every 6 hours as needed for anxiety or tremor.  CONDITION: Guarded.  DISPOSITION: The patient is being discharged home. Recommendation has been made for SNF placement or possible geropsych placement. The family has declined and preferred to take him home with home health nursing, nurse's aide and physical therapy.   DISCHARGE DIET: Heart healthy, low fat, low sodium diet.   DISCHARGE ACTIVITY: As tolerated.   TIMEFRAME FOR FOLLOW-UP: Please follow up within 1 to 2 weeks with your primary care physician. He will follow up in 1 to 2 days with [employer].  TIME SPENT ON DISCHARGE: 45 minutes.  ____________________________ Earleen Newport. Volanda Napoleon, MD cpw:sb D: 05/23/2014 09:08:14 ET T: 05/23/2014 10:57:56 ET JOB#: [id number]  cc: Barnetta Chapel P. Volanda Napoleon, MD, <Dictator> Aldean Jewett  MD ELECTRONICALLY SIGNED 05/30/2014 23:20

## 2014-08-11 NOTE — H&P (Signed)
PATIENT NAME:  [name], BRAMAN MR#:  [id number] DATE OF BIRTH:  July 01, 1940  DATE OF ADMISSION:  05/13/2014  PRIMARY CARE PHYSICIAN: Nonlocal.  REFERRING PHYSICIAN: Ahmed Prima, MD   CHIEF COMPLAINT: Confusion and worsening tremor with poor oral intake for 4 days.   HISTORY OF PRESENT ILLNESS: A 74 year old Caucasian male with a history of hypertension, COPD, Parkinson disease with tremor was sent from home due to above chief complaint. The patient is alert, awake, oriented, in no acute distress. According to the patient and the patient's wife, the patient has had worsening tremor for the past 4 days. In addition, the patient has a very poor oral intake and feels dehydrated. According to the patient's wife, the patient's mental status is also worsening. The patient was agitated in the ED, was treated with 1 dose of Geodon about 2 hours ago. Dr. Thomasene Lot noticed that the patient's heart rate increased to 150, but it decreased to 50, then back to about 110. Dr. Thomasene Lot suggested admitting the patient for observation.   PAST MEDICAL HISTORY: COPD, hypertension, Parkinson disease, tremors.   PAST SURGICAL HISTORY: Testicular surgery.   SOCIAL HISTORY: No smoking or drinking or illicit drugs. Living with his wife.  ALLERGIES: PENICILLIN.   HOME MEDICATIONS: Medication reconciliation not done yet.   FAMILY HISTORY: Brothers had cancer of unknown type.   REVIEW OF SYSTEMS: CONSTITUTIONAL: The patient denies any fever or chills, but has headache and generalized weakness and poor oral intake. No dizziness.  EYES: No double vision or blurred vision.  EARS, NOSE, AND THROAT: No postnasal drip, slurred speech, or dysphagia.  CARDIOVASCULAR: No chest pain, palpitation, orthopnea, or nocturnal dyspnea. No leg edema.  PULMONARY: No cough, sputum, shortness of breath, or hemoptysis.  GASTROINTESTINAL: No abdominal pain, nausea, vomiting, diarrhea. No melena or bloody stool. GENITOURINARY: No  dysuria, hematuria, but has incontinence.  SKIN: No rash or jaundice.  NEUROLOGY: No syncope, loss of consciousness, or seizure, but has worsening tremor.  HEMATOLOGY: No easy bruising or bleeding.  ENDOCRINE: No polyuria, polydipsia, heat or cold intolerance.   PHYSICAL EXAMINATION:  VITAL SIGNS: Temperature 98.7, blood pressure 148/55, pulse 108, oxygen saturation 100% on room air.  GENERAL: The patient is alert, awake, oriented, in no acute distress, but has severe tremor.  HEENT: Pupils round, equal, reactive to light and accommodation. Dry oral mucosa. Clear oropharynx.  NECK: Supple. No JVD or carotid bruit. No lymphadenopathy. No thyromegaly.  CARDIOVASCULAR: S1 and S2, regular rate, tachycardia. No murmurs or gallops.  PULMONARY: Bilateral air entry. No wheezing, but has son crackles on the right side. No use of accessory muscles to breathe.  ABDOMEN: Soft. No distention. No tenderness. No organomegaly. Bowel sounds present.  EXTREMITIES: No edema, clubbing, or cyanosis. No calf tenderness. Bilateral pedal pulses present.  SKIN: No rash or jaundice.  NEUROLOGY: A and O x 3. Follows commands. No focal deficit, but has severe tremor on bilateral upper extremities.   LABORATORY DATA: Chest x-ray: Stable examination. No active cardiopulmonary process. CAT scan of head: No acute intracranial abnormality.   CBC in normal range. Glucose 158, BUN 10, creatinine 0.98. Electrolytes are normal except potassium 3.2.   Urinalysis is negative. TSH 0.649. Urine toxicology: Showed benzodiazepine positive.   EKG showed sinus tachycardia with occasional PVC at 103 BPM.   IMPRESSIONS:  1.  Arrhythmia.  2.  Altered mental status.  3.  Hypokalemia. 4.  Poor oral intake.  5.  Worsening tremor.  6.  Hypertension.  7.  Chronic obstructive pulmonary.  8.  Parkinson disease.   PLAN OF TREATMENT:  1.  The patient will be placed for observation. We will continue telemonitor, get a cardiology consult  for arrhythmia. The patient's wife requested Dr. Clayborn Bigness consult. 2.  For hypokalemia, we will give potassium supplement and follow up potassium and magnesium level. The patient has dehydration clinically, we will give IV fluid support. 3.  For worsening tremor, we will continue the patient's home medication, propranolol.  4.  For hypertension, continue clonidine.  5.  The patient's COPD is stable.   I discussed the patient's condition and plan of treatment with the patient and the patient's wife.   CODE STATUS: The patient wants limited code and he does not want CPR, but wants intubation, mechanical ventilation, antiarrhythmic medication, vasoactive drugs, and defibrillator, and cardioversion.   TIME SPENT: About 54 minutes.    ____________________________ Demetrios Loll, MD qc:TT D: 05/13/2014 18:41:18 ET T: 05/13/2014 18:57:49 ET JOB#: [id number]  cc: Demetrios Loll, MD, <Dictator> Demetrios Loll MD ELECTRONICALLY SIGNED 05/14/2014 22:33

## 2014-08-11 NOTE — Consult Note (Signed)
Chief Complaint:  Subjective/Chief Complaint Patient still for tremors wants to go home denies any pain or palpitations or tachycardia   VITAL SIGNS/ANCILLARY NOTES: **Vital Signs.:   03-Feb-16 11:14  Vital Signs Type Routine  Temperature Temperature (F) 97.8  Celsius 36.5  Pulse Pulse 72  Respirations Respirations 20  Systolic BP Systolic BP 408  Diastolic BP (mmHg) Diastolic BP (mmHg) 70  Mean BP 89  Pulse Ox % Pulse Ox % 95  Pulse Ox Activity Level  At rest  Oxygen Delivery Room Air/ 21 %  *Intake and Output.:   Shift 03-Feb-16 15:00  Grand Totals Intake:  240 Output:  350    Net:  -110 24 Hr.:  -110  Oral Intake      In:  240  Urine ml     Out:  350  Length of Stay Totals Intake:  3110 Output:  1500    Net:  1610   Brief Assessment:  GEN well developed, well nourished, no acute distress, thin, tremors   Cardiac Regular   Respiratory normal resp effort  clear BS   Gastrointestinal Normal   Gastrointestinal details normal Soft  Nontender  Nondistended   EXTR negative edema   Lab Results: Routine Chem:  02-Feb-16 08:08   Glucose, Serum  118  BUN 8  Creatinine (comp) 0.79  Sodium, Serum 143  Potassium, Serum  3.0  Chloride, Serum 106  CO2, Serum 29  Calcium (Total), Serum  8.3  Anion Gap 8  Osmolality (calc) 284  eGFR (African American) >60  eGFR (Non-African American) >60 (eGFR values <65m/min/1.73 m2 may be an indication of chronic kidney disease (CKD). Calculated eGFR, using the MRDR Study equation, is useful in  patients with stable renal function. The eGFR calculation will not be reliable in acutely ill patients when serum creatinine is changing rapidly. It is not useful in patients on dialysis. The eGFR calculation may not be applicable to patients at the low and high extremes of body sizes, pregnant women, and vegetarians.)   Radiology Results: XRay:    01-Feb-16 13:17, Chest Portable Single View  Chest Portable Single View   REASON FOR  EXAM:    Altered Mental Status  COMMENTS:       PROCEDURE: DXR - DXR PORTABLE CHEST SINGLE VIEW  - May 13 2014  1:17PM     CLINICAL DATA:  Altered mental status.  Initial encounter.    EXAM:  PORTABLE CHEST - 1 VIEW    COMPARISON:  04/05/2014 and 03/28/2014 radiographs.    FINDINGS:  1311 hr. The heart size and mediastinal contours are normal. The  lungs are clear. There is no pleural effusion or pneumothorax. No  acute osseous findings are identified. Telemetry leads overlie the  chest.     IMPRESSION:  Stable examination.  No active cardiopulmonary process.      Electronically Signed    By: BCamie PatienceM.D.    On: 05/13/2014 13:48         Verified By: WVivia Ewing M.D.,  Cardiology:    01-Feb-16 13:22, ED ECG  Ventricular Rate 103  Atrial Rate 103  P-R Interval 158  QRS Duration 80  QT 358  QTc 468  P Axis 80  R Axis 13  T Axis 40  ECG interpretation   Sinus tachycardia with occasional Premature ventricular complexes  Possible Anterior infarct (cited on or before 05-Apr-2014)  Abnormal ECG  When compared with ECG of 05-Apr-2014 19:09,  Premature ventricular complexes are  now Present  T wave inversion no longer evident in Lateral leads  ----------unconfirmed----------  Confirmed by OVERREAD, NOT ([id number]), editor PEARSON, BARBARA ([id number]) on 05/14/2014 2:19:35 PM  ED ECG   CT:    01-Feb-16 13:13, CT Head Without Contrast  CT Head Without Contrast   REASON FOR EXAM:    ams  COMMENTS:   May transport without cardiac monitor    PROCEDURE: CT  - CT HEAD WITHOUT CONTRAST  - May 13 2014  1:13PM     CLINICAL DATA:  Shaking and vomiting for past 4 days, history COPD,  Parkinson's, hypertension    EXAM:  CT HEAD WITHOUT CONTRAST    TECHNIQUE:  Contiguous axial images were obtained from the base of the skull  through the vertex without intravenous contrast.  COMPARISON:  06/11/2013    FINDINGS:  Mild generalized atrophy.    Normal ventricular  morphology.    No midline shift or mass effect.    Otherwise normal appearance of brain parenchyma.    No intracranial hemorrhage, mass lesion, or acute infarction.    Visualized paranasal sinuses clear.  Bones unremarkable.     IMPRESSION:  Generalized atrophy.    No acute intracranial abnormalities.      Electronically Signed    By: Lavonia Dana M.D.    On: 05/13/2014 13:43         Verified By: Burnetta Sabin, M.D.,   Assessment/Plan:  Assessment/Plan:  Assessment palpitations  tachycardia  tremors  possible Parkinson's  depression  possible psychosis  weakness  hypokalemia .   Plan replace potassium  recommend beta-blockers for tremors  agree with psychiatric imput  continue current medications for mild depression  recommend physical therapy  continue Neurology input for Parkinson's therapy  have the patient follow-up as outpatient   Electronic Signatures: Yolonda Kida (MD)  (Signed 03-Feb-16 17:50)  Authored: Chief Complaint, VITAL SIGNS/ANCILLARY NOTES, Brief Assessment, Lab Results, Radiology Results, Assessment/Plan   Last Updated: 03-Feb-16 17:50 by Lujean Amel D (MD)

## 2014-08-11 NOTE — Consult Note (Signed)
PATIENT NAME:  [name], GANGE MR#:  [id number] DATE OF BIRTH:  [birth year]  DATE OF CONSULTATION:  05/14/2014  REFERRING PHYSICIAN:  Demetrios Loll, MD CONSULTING PHYSICIAN:  Dwayne D. Callwood, MD  CHIEF COMPLAINT: Tremors, elevated heart rate with palpitations.   HISTORY OF PRESENT ILLNESS: The patient is a 74 year old white male with history of hypertension, COPD, tremors and through to be Parkinson's. Was sent from home because of above complaints. The patient is awake alert, oriented, in no distress, but with severe tremors. The patient's wife states he has had worsening tremors over the last several days. The patient has very poor intake, was dehydrated, was seen by his neurologist and thought that he may have Parkinson's, but the diagnoses is not confirmed.  The patient has been treated with Geodon.  Recent dose of Geodon has been increased recently.  He was admitted for observation for what was thought to be significant palpitations, tachycardia.   PAST MEDICAL HISTORY: COPD, hypertension, Parkinson disease, tremors.   PAST SURGICAL HISTORY: Testicular surgery.   SOCIAL HISTORY: No smoking or alcohol consumption. Lives with his wife.  ALLERGIES: PENICILLIN.   HOME MEDICATIONS: Medication reconciliation has not been done.  FAMILY HISTORY: Cancer.   REVIEW OF SYSTEMS: No blackout spells or syncope.  No nausea or vomiting. No fever. No chills. No sweats. No weight loss. No hemoptysis or hematemesis. No bright red blood per rectum. He has had palpitations, tachycardia, tremors, and Parkinson's.   PHYSICAL EXAMINATION: VITAL SIGNS: Blood pressure 148/65, pulse of 100, respiratory rate of 16, afebrile.  HEENT: Normocephalic, atraumatic. Pupils equal and reactive to light.  NECK: Supple. No significant JVD, bruits, or adenopathy.  LUNGS: Clear to auscultation and percussion. No significant wheeze, rhonchi, or rale.  HEART: Regular rate and rhythm.  ABDOMEN: Positive bowel sounds. No  guarding, rebound, or tenderness. EXTREMITIES: Within normal limits.  NEUROLOGIC: Intact.  SKIN: Normal.   DIAGNOSTIC DATA: Chest x-ray clear. CT of the head negative. CBC normal range, glucose of 158, BUN 10, creatinine 0.98. Electrolytes are normal. Potassium was 3.2. UA was negative. TSH 0.67. Urine toxicology was positive for benzodiazepines.   EKG: Sinus tachycardia, PVCs, nonspecific ST-T wave changes.   ASSESSMENT: Arrhythmia, palpitations, tachycardia, altered mental status, hypokalemia, mild dehydration, possible Parkinson's, tremors, hypertension, chronic obstructive pulmonary disease.   PLAN: I agree with admit on telemetry for palpitations and tachycardia. Consider treatment with Cardizem versus beta blocker for now.  Echocardiogram would be helpful. Replace potassium and correct electrolytes. Increase volume, consider rehydration with fluids. Recommend further evaluation by neurology for tremors. Beta blockers may also be helpful in that regard. Continue inhalers for COPD. Consider further Parkinson's therapy. Do not recommend cardiac catheterization. I do not recommend further cardiac studies unless arrhythmias become more apparent. We will treat the patient medically for now.    ____________________________ Loran Senters. Clayborn Bigness, MD ddc:LT D: 05/14/2014 15:22:00 ET T: 05/14/2014 17:02:44 ET JOB#: [id number]  cc: Dwayne D. Clayborn Bigness, MD, <Dictator> Yolonda Kida MD ELECTRONICALLY SIGNED 05/21/2014 17:33

## 2014-08-11 NOTE — Consult Note (Signed)
Psychiatry: PAtient seen and chart reviewed. He is back to flapping and shaking today. 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 think this is still mainly psychiatric. Patient says he wants to go home but it seems clear he cant function at home. Still not able to walk well. I have suggested geriatric psych unit. If he could ambulate beter he could possibly go to our psych unit but not in current condition. Will increase Seroquel dose to 200mg  daily and follow regularly.  Electronic Signatures: Gonzella Lex (MD)  (Signed on 04-Feb-16 22:55)  Authored  Last Updated: 04-Feb-16 22:55 by Gonzella Lex (MD)

## 2014-08-11 NOTE — Consult Note (Signed)
PATIENT NAME:  [name], [name] MR#:  [id number] DATE OF BIRTH:  October 01, 1940  DATE OF CONSULTATION:  05/14/2014  CONSULTING PHYSICIAN:  Leotis Pain, MD  REASON FOR CONSULTATION:  Tremor, bilateral upper extremities, as well as confusion.  HISTORY OF PRESENT ILLNESS: A 74 year old gentleman with past medical history of COPD, tremor, that has been followed up by an outpatient clinic and determined to be not parkinsonian disease. Admitted with confusion, lethargy. Apparently, as per family, the patient has not been having any p.o. intake for about 4-5 days and appears that he is dehydrated on admission. The patient has tremor in bilateral upper extremities. That tremor is distractible. When speaking to the patient and asked him to relax, he is able to relax and the tremor subsides. It changes with amplitude and frequency.  Tone is within normal limits.   PAST MEDICAL HISTORY: COPD, hypertension, questionable Parkinson disease, even though I do not think that is the correct diagnosis of tremor.   PAST SURGICAL HISTORY: Testicular surgery.  SOCIAL HISTORY:  No smoking or illicit drug use.   ALLERGIES:  PENICILLIN.   HOME MEDICATIONS: Have been reviewed.   FAMILY HISTORY: Father had cancer of unknown type.  REVIEW OF SYSTEMS: Unable to obtain due to patient's condition.   NEUROLOGICAL EVALUATION: The patient is awake, alert to his name.  Could not tell me the date or the reason why he is in the hospital.  Speech appears be dysarthric, but the patient appears to be severely anxious. Extraocular movements are intact. Facial sensation intact. Facial motor is intact. Tongue is midline. Uvula elevates symmetrically. Perioral tremor that subsides with distraction. Tremor in the bilateral upper extremities that changes in amplitude and frequency and is completely distractible as well.  Bilateral lower extremity weakness.    IMPRESSION: A 74 year old gentleman with past medical history of hypertension,  chronic obstructive pulmonary disease, tremors. was seen outpatient for history of these tremors and they are not consistent with parkinsonian disease due to change in altitude, this is not a resting tremor, is not a pill-rolling tremor, and tone is within normal limits.   PLAN: As stated above, I do not think parkinsonian disease. I do not think this is a  physiological tremor. This is not chorea; therefore, do not think there is any need for further imaging at this point.  Hypokalemia is being addressed. The patient is on hydration, physical therapy and occupational therapy. Discharge planning from a neurological standpoint. Please call with any questions.    ____________________________ Leotis Pain, MD yz:LT D: 05/14/2014 16:11:42 ET T: 05/14/2014 17:17:13 ET JOB#: [id number]  cc: Leotis Pain, MD, <Dictator> Leotis Pain MD ELECTRONICALLY SIGNED 05/21/2014 12:09

## 2014-08-11 NOTE — Consult Note (Signed)
PATIENT NAME:  [name], [name] MR#:  [id number] DATE OF BIRTH:  1940/06/08  DATE OF CONSULTATION:  05/14/2014  CONSULTING PHYSICIAN:  Gonzella Lex, MD  IDENTIFYING INFORMATION AND REASON FOR CONSULTATION:  This is a 74 year old man with a history of COPD and some history of depression, who was admitted to the hospital with new-onset confusion and tremor. Consult for possible contribution of psychiatric illness.   HISTORY OF PRESENT ILLNESS:  Information obtained from the patient and his wife. The time course of events was not entirely clear, but the wife reports that for about the past week, the patient has been increasingly confused. His memory has been poor and he has at times seemed to be confused about his surroundings. He has not been eating or drinking much, really only a little bit of cereal. His sleep has been erratic. He has also developed a tremor, which is primarily noticeable as a flapping motion of his hands, but is also noticeable as a sort of gasping motion of his jaw and mouth. The wife also reports that several weeks ago, he had an episode in which he was so depressed that he stayed in bed for 3 weeks straight. This came to an end when the wife told him abruptly that he had to get out of bed, at which point he did so, but sometime after that the current symptoms started. Additional information is that the patient had been taking the medicine Abilify for what the wife estimates to be a couple of years. Within the last month or so, they had to discontinue it because the insurance changed and the price became prohibitive. He has continued on antidepressant medication. There is no evidence or report of drugs or alcohol. No other clear major stress identified.   PAST PSYCHIATRIC HISTORY:  The patient has a past history of treatment for depression and had been on Cymbalta 60 mg for some time. He was also taking 7.5 mg of Abilify for some years, but had recently stopped it. There is no known  history of suicide attempts or violence. Looking back at his old chart, it is interesting to note that in February 2005 he presented to the hospital with a similar situation of acute onset of bizarre behavior that was medically inexplicable. The ultimate diagnosis seemed unconvincing. It is unclear what followup there was to that.   FAMILY HISTORY:  Unknown whether he has any family history of mental illness.   SOCIAL HISTORY:  Lives with his wife. His wife reports that he has been questioning whether she is going to leave him even though she appears to be very faithfully devoted to him. The patient is not working. No one else evidently at home.   PAST MEDICAL HISTORY:  History of COPD and a history of cardiac arrhythmia and high blood pressure.   CURRENT MEDICATIONS:  I was not completely sure I was clear on what he was taking. It appears that he is still taking Cymbalta 60 mg a day, lisinopril 5 mg a day, clonidine 0.1 mg twice a day, propranolol 40 mg per day, but has stopped the Abilify.   ALLERGIES:  ASPIRIN AND PENICILLIN.   SUBSTANCE ABUSE HISTORY:  No history of alcohol or drug abuse.   REVIEW OF SYSTEMS:  The patient complains of a tremor in his arms and face, which is flapping in nature. He denies any suicidal ideation. Denies hallucinations. He does state that he has been depressed. The rest of the history currently is noncontributory.  He is not having much of an appetite.   MENTAL STATUS EXAMINATION:  Chronically ill, disheveled-looking gentleman who looks his stated age. He is passively cooperative with the interview, although it is hard to understand much of his speech and he prefers to have his wife answer questions. Eye contact is poor. Psychomotor activity is marked by a flapping and gulping motion, although despite that, he is able to eat a bowl of cereal. His speech is difficult to understand and dysarthric with a gulping motion his face. Affect is hard to read but seems anxious.  Mood is stated as depressed. Thoughts are slowed and simple. No obvious delusions. Denies auditory or visual hallucinations. Denies suicidal or homicidal ideation. He is able to repeat 3 words immediately and could remember 2 out of 3 at 3 minutes. Alert and oriented to his situation. Judgment and insight are probably impaired. Intelligence is probably average.   LABORATORY RESULTS:  The drug screen was positive for benzodiazepines. Magnesium level was normal. TSH was normal. Troponin was normal. Slightly low potassium at 3.2 and elevated glucose at 158. CBC was unremarkable.   CT scan of the head without contrast shows mild generalized atrophy. No other notable abnormality.   VITAL SIGNS:  Blood pressure currently 163/74, respirations 20, pulse 76, temperature 98.6.   ASSESSMENT:  A 74 year old man with a history of a diagnosis of depression and at least one prior hospitalization for bizarre behavior. He has been seen by neurology, who believed that his current symptoms are not related to neurologic pathology. I note from his history that he had been taking Abilify and had recently stopped it. It is possible that if he had been developing tardive dyskinesia and then stopped the offending medication, the symptoms could worsen, although what he is having only looks a little bit like tardive dyskinesia. It seems to me equally or more likely that this is a psychiatric condition presenting with bizarre behavior.   TREATMENT PLAN:  I have initiated a low but standing dose of Ativan to see if that improves his current symptoms. If this is a catatonic or anxiety-type phenomenon, you would expect to see at least some improvement with that. I will follow up with him. If the workup is negative but he continues with his current behavior, he could possibly be a candidate for psychiatric hospitalization.   DIAGNOSIS, PRINCIPAL AND PRIMARY:  AXIS I:  Delirium of unclear etiology.   SECONDARY DIAGNOSES: AXIS I:   Rule out psychosis.   AXIS II:  Deferred.   AXIS III:  Chronic obstructive pulmonary disease, hypertension.    ____________________________ Gonzella Lex, MD jtc:nb D: 05/14/2014 22:33:54 ET T: 05/14/2014 23:10:22 ET JOB#: [id number]  cc: Gonzella Lex, MD, <Dictator> Gonzella Lex MD ELECTRONICALLY SIGNED 05/22/2014 17:25

## 2014-08-11 NOTE — Consult Note (Signed)
Brief Consult Note: Diagnosis: depression nos.   Patient was seen by consultant.   Consult note dictated.   Recommend further assessment or treatment.   Orders entered.   Comments: Psychiatry: Patient seen and chart reviewed. Luana presentation. History could support at least a possibility of tardive dyskinesia. Also hx of depression but current symptoms unclear. Will try to expand in full note. For now written for ativan 1mg  iv q4 standing to see if that makes much difference as it shouldf if this is more of a psychogentic or catatonic type behavior. Will follow.  Electronic Signatures: Gonzella Lex (MD)  (Signed 02-Feb-16 17:59)  Authored: Brief Consult Note   Last Updated: 02-Feb-16 17:59 by Gonzella Lex (MD)

## 2014-08-11 NOTE — Consult Note (Signed)
PATIENT NAME:  [name], [name] MR#:  [id number] DATE OF BIRTH:  [birth year]  DATE OF CONSULTATION:  05/15/2014  REFERRING PHYSICIAN:   CONSULTING PHYSICIAN:  Gonzella Lex, MD  HISTORY OF PRESENT ILLNESS: Update since evaluation yesterday, In the interim, I have placed him on 1 mg of intravenous Ativan every 4 hours as a standing dose to see if it resolves his tremor. It has been quite successful. The family is once again available for interview and report that the medicine relieves his tremor completely. Also, however, it has been putting him to sleep much of the time. They report that he still has some confusion when he is awake, but seems less agitated.   On interview and examination today, I found the patient to be asleep, but arousable by speaking his name and gently touching his shoulder. At sleep, he had no tremor at all. When he woke up, he continued to have no tremor at all. As he fell back to sleep, it looked like one of his wrists was shaking, but then it stopped the tremor, as well. Speech continues to be very minimal and slow. Affect flat and appears confused. The patient continues to acknowledge feeling depressed.   Again review of his past history suggests that this is not the first time he has presented with bizarre physical symptoms as a manifestation of his psychiatric illness. After evaluation of the efficacy of the Ativan, I think that this is a catatonic-like behavior, and that the patient is having an episode of severe depression.   Due to his age and current needs for nursing care, the patient would not be able to function on the inpatient psychiatry ward here. I am putting in a social work consult to request referral to geriatric psychiatry. The family is in agreement with the plan. I have discontinued the standing intravenous Ativan and replaced it with 1 mg of oral Ativan just every 6 hours to see if we can get the same effect without putting him to sleep constantly. I am also  going to start him on Seroquel 100 mg at night as an initial treatment for what I think is a psychotic depression. We will follow as needed.   DIAGNOSIS: Major depression, severe with psychotic features. Rule out bipolar disorder, catatonia.    ____________________________ Gonzella Lex, MD jtc:JT D: 05/15/2014 13:42:36 ET T: 05/15/2014 13:51:43 ET JOB#: [id number]  cc: Gonzella Lex, MD, <Dictator> Gonzella Lex MD ELECTRONICALLY SIGNED 05/22/2014 17:25

## 2014-09-04 ENCOUNTER — Encounter: Payer: Self-pay | Admitting: Neurology

## 2014-09-05 ENCOUNTER — Other Ambulatory Visit (HOSPITAL_COMMUNITY): Payer: Self-pay | Admitting: Neurology

## 2014-09-05 ENCOUNTER — Ambulatory Visit (INDEPENDENT_AMBULATORY_CARE_PROVIDER_SITE_OTHER): Payer: PPO | Admitting: Neurology

## 2014-09-05 ENCOUNTER — Encounter: Payer: Self-pay | Admitting: Neurology

## 2014-09-05 VITALS — BP 118/80 | HR 80 | Ht 69.0 in | Wt 141.0 lb

## 2014-09-05 DIAGNOSIS — F482 Pseudobulbar affect: Secondary | ICD-10-CM

## 2014-09-05 DIAGNOSIS — G2401 Drug induced subacute dyskinesia: Secondary | ICD-10-CM

## 2014-09-05 DIAGNOSIS — F458 Other somatoform disorders: Secondary | ICD-10-CM | POA: Diagnosis not present

## 2014-09-05 DIAGNOSIS — R569 Unspecified convulsions: Secondary | ICD-10-CM | POA: Diagnosis not present

## 2014-09-05 DIAGNOSIS — G232 Striatonigral degeneration: Secondary | ICD-10-CM

## 2014-09-05 DIAGNOSIS — K117 Disturbances of salivary secretion: Secondary | ICD-10-CM | POA: Diagnosis not present

## 2014-09-05 DIAGNOSIS — R1319 Other dysphagia: Secondary | ICD-10-CM

## 2014-09-05 DIAGNOSIS — R1314 Dysphagia, pharyngoesophageal phase: Secondary | ICD-10-CM

## 2014-09-05 MED ORDER — CARBIDOPA-LEVODOPA 25-100 MG PO TABS: 1.0000 | ORAL_TABLET | Freq: Three times a day (TID) | ORAL | Status: DC

## 2014-09-05 NOTE — Progress Notes (Signed)
[name] was seen today in the movement disorders clinic for neurologic consultation at the request of  Dr. Melrose Nakayama for a second opinion.  His primary care physician is Dr. Marguerita Merles, MD.  The consultation is for the evaluation of tremor.  Notes from Dr. Melrose Nakayama were reviewed.  The patient's wife of 5 years also supplements the history.  Dr. Melrose Nakayama indicates that he felt that the patient's tremor is likely from chronic neuroleptic use in the past.  Notes from Dr. Melrose Nakayama indicates that the patient was on Seroquel, which appears that this was discontinued in early part of 2016.  His wife indicates that he was also on abilify and that was d/c in Jan 2016.  His wife doesn't remember the seroquel but that was what records indicate.  Wife states that it was abilify and he was on it x 1.5 years.  He was also on Depakote when Dr. Melrose Nakayama first began to see the patient, but is off of that now.  He is on Cogentin, 1 mg 3 times per day.  Pt states that it was started by Dr. Melrose Nakayama on 06/18/14 and pts wife states that no benefit.  Things may have gotten worse with it.  Wife states that tremor has been going on for 1.5 years.  It started with tremor in both hands.  Tremor has significantly picked up in both hands.  His wife states that he is now grunting and it happens all day long.  That has been going on for 3 months as has the head "bobbing."   Specific Symptoms:  Tremor: Yes.   Voice: hypophonic Sleep: trouble getting to sleep  Vivid Dreams:  No.  Acting out dreams:  No. (very quiet in the bed) Wet Pillows: Yes.   Postural symptoms:  Yes.    Falls?  Yes.   (last fall few months ago, no fx) Bradykinesia symptoms: slow movements and difficulty getting out of a chair Loss of smell:  Yes.   Loss of taste:  No. Urinary Incontinence:  Yes.  , 6 months (doesn't wear undergarments) Difficulty Swallowing:  Yes.  , x 1.5 months (with solids, liquids, pills) Handwriting, micrographia: unable to write  because of the tremor Trouble with ADL's:  Yes.   (dresses himself but wife admits that he may sleep in clothes for days so doesn't have to change them)  Trouble buttoning clothing: Yes.   Depression:  No., but lots of crying spells Memory changes:  Yes.   (has never driven) Hallucinations:  No.  visual distortions: Yes.   N/V:  No. Lightheaded:  Yes.    Syncope: No. Diplopia:  No. Dyskinesia:  No.  They are unaware if neuroimaging has been performed.  He did have an EEG in January, 2015 that was reported to be normal.  His wife does mention a history of seizure.  She states that it started over 20 years ago and she really does not know the details of it.  She does note that he was in the hospital for a long time and when he came out he had to relearn to do everything.  She states that he had been seizure-free for well over a year until a few weeks ago when he was sitting in the chair and his eyes appeared to roll back and he slumped over for a maximum of 3 seconds and then was completely normal again.  ALLERGIES:   Allergies  Allergen Reactions  . Levaquin [Levofloxacin In D5w]   .  Penicillins     CURRENT MEDICATIONS:  Outpatient Encounter Prescriptions as of 09/05/2014  Medication Sig  . benztropine (COGENTIN) 1 MG tablet Take 1 mg by mouth 3 (three) times daily.  . DULoxetine (CYMBALTA) 60 MG capsule Take 120 mg by mouth daily.   Marland Kitchen lisinopril (PRINIVIL,ZESTRIL) 5 MG tablet Take 5 mg by mouth daily.  . pravastatin (PRAVACHOL) 40 MG tablet Take 40 mg by mouth daily.  . propranolol (INDERAL) 40 MG tablet Take 40 mg by mouth 2 (two) times daily.   . [DISCONTINUED] diazepam (VALIUM) 5 MG tablet Take 5 mg by mouth 2 (two) times daily.   No facility-administered encounter medications on file as of 09/05/2014.    PAST MEDICAL HISTORY:   Past Medical History  Diagnosis Date  . Depression   . Essential tremor   . Vitamin D deficiency   . Seizure disorder   . Hypertension   .  Hyperlipidemia   . Diabetes   . Heart disease     PAST SURGICAL HISTORY:   Past Surgical History  Procedure Laterality Date  . Inguinal hernia repair Left as child    SOCIAL HISTORY:   History   Social History  . Marital Status: Married    Spouse Name: N/A  . Number of Children: N/A  . Years of Education: N/A   Occupational History  . Not on file.   Social History Main Topics  . Smoking status: Former Smoker    Quit date: 09/04/2004  . Smokeless tobacco: Not on file  . Alcohol Use: No  . Drug Use: No  . Sexual Activity: Not on file   Other Topics Concern  . Not on file   Social History Narrative    FAMILY HISTORY:   Family Status  Relation Status Death Age  . Father Deceased     cancer, emphysema  . Mother Deceased     depression  . Brother Alive   . Sister Alive   . Brother Deceased     ROS:  A complete 10 system review of systems was obtained and was unremarkable apart from what is mentioned above.  PHYSICAL EXAMINATION:    VITALS:   Filed Vitals:   09/05/14 0915  BP: 118/80  Pulse: 80  Height: 5\' 9"  (1.753 m)  Weight: 141 lb (63.957 kg)    GEN:  The patient appears stated age and is in NAD. HEENT:  Normocephalic, atraumatic.  The mucous membranes are very dry. The superficial temporal arteries are without ropiness or tenderness.  The tongue moves about in the mouth.  It does not come out of the mouth. CV:  RRR Lungs:  CTAB Neck/HEME:  There are no carotid bruits bilaterally.  The head/neck is held in a flexed manner with the chin close to the chest.  He has difficulty lifting the head to a neutral position.  Neurological examination:  Orientation: The patient is able to correctly identify the month, year and day of the week.  He does not know the exact date in the month, but is able to correctly state that it is in the 20s.  He does not want to participate in the [facility].  He does rely on his wife to provide most details of the history. Cranial  nerves: There is good facial symmetry.  There is significant facial hypomimia.  Pupils are equal round and reactive to light bilaterally. Fundoscopic exam reveals clear margins bilaterally. Extraocular muscles are intact. The visual fields are full to confrontational testing.  There is very little spontaneity of speech, but when he speaks it is mildly dysarthric but fluent.  He grunts throughout the entire examination.  Soft palate rises symmetrically and there is no tongue deviation. Hearing is intact to conversational tone. Sensation: Sensation is intact to light and pinprick throughout (facial, trunk, extremities). Vibration is intact at the bilateral big toe. There is no extinction with double simultaneous stimulation. There is no sensory dermatomal level identified. Motor: Strength is 5/5 in the bilateral upper and lower extremities with the exception of the arm abductors bilaterally and strength there is 4/5 due to pain in the shoulder.   Shoulder shrug is equal and symmetric.  There is no pronator drift. Deep tendon reflexes: Deep tendon reflexes are 2+/4 at the bilateral biceps, triceps, brachioradialis, 2+-3/4 at the bilateral patella and achilles.  There is no ankle clonus.  Plantar responses are downgoing bilaterally.  Movement examination: Tone: There is normal tone in the right upper extremity.  There is mild increased tone in the left upper extremity and moderate increased tone in the bilateral lower extremities, left greater than right.  Abnormal movements: There is a constant bilateral upper extremity resting tremor, moderate in nature.  It goes away when the patient uses his hands. Coordination:  There is mild decremation with RAM's, seen with hand opening and closing just slightly and finger taps. Gait and Station: The patient has significant difficulty arising out of a deep-seated chair without the use of the hands.  He is unable to do this and has to push off the chair.  When he gets up,  he does use his cane.  He has a significant stooped posture.  He is very short stepped.  At times, the left leg we'll stick to the ground.  He turns en bloc.    ASSESSMENT/PLAN:  1.  Multiple system atrophy  -Long discussion with the patient and his wife today.  Much greater than 50% of the 60 minute visit in counseling.  Talked to them about the fact that I think he likely has multiple system atrophy.  He has neck flexion, dysphagia, parkinsonism and possible orthostasis.  Talked to them about diagnosis, pathophysiology and prognosis.  I explained to them that he has been off of the Abilify long enough that I do not think that this is from that medication.  Tardive parkinsonism is very rare.    -I think that the Cogentin is potentially making his memory and gait instability worse.  They really do not think it is helping.  I'm going to wean him off of that.  It is certainly the likely etiology of dry mouth which has become bothersome.  -I'm going to add levodopa and work up to carbidopa/levodopa 25/100, one tablet 3 times per day.  Risks, benefits, side effects and alternative therapies were discussed.  The opportunity to ask questions was given and they were answered to the best of my ability.  The patient expressed understanding and willingness to follow the outlined treatment protocols.  -I do think that the patient could benefit significantly from physical, occupational and speech therapy, but we decided to hold off on that until we see how changing his medications makes him feel. 2.  Dysphagia  -He will have a modified barium swallow 3.  Sialorrhea  -He is actually on a strong anticholinergic currently and even though he has dry mouth, he continues to have drooling.  In addition, I do not think he is a candidate for anticholinergic therapy because of  memory change and gait instability.  I'm taking him off of his anticholinergic medication and he and his wife would like to see if we can get approval  for Myobloc.  Risks, benefits, side effects discussed including the black box warning. 4.  Tardive dyskinesia  -The patient does have some of this in the tongue.  I suspect that this is, in fact, from long-standing use of anti-psychotic medication.  While tardive parkinsonism is rare, tardive dyskinesia is not rare.  He is not bothered by this. 5.  Pseudobulbar affect  -Talked to the patient and his wife about the fact that this is likely etiology of why he is crying.  Decided to hold off on trying medication, including Nuedexta, right now since we are changing other medications.  We will consider this in the future. 6.  History of seizure  -His wife believes he had a seizure a few weeks ago, but prior to that had been seizure-free for over a year.  I will do an EEG.  Seizure and safety discussed. 7.  Lightheadedness  -This certainly may represent orthostasis associated with MSA and we will need to watch this closely.  8.  Dementia  -The patient does not participate completely with neurocognitive testing today.  We can try this again in the future.  He does have 24-hour per day care and does not drive (never has). 9.  Follow-up with me will be in the next few months, sooner should new neurologic issues arise.

## 2014-09-05 NOTE — Progress Notes (Signed)
Note routed to Dr Lennox Grumbles.

## 2014-09-05 NOTE — Patient Instructions (Signed)
1. Possible diagnosis includes multiple system atrophy.  2. Start Carbidopa Levodopa as follows: 1/2 tab three times a day before meals x 1 wk, then 1/2 in am & noon & 1 in evening for a week, then 1/2 in am &1 at noon &one in evening for a week, then 1 tablet three times a day before meals. 3. Stop Cogentin as follows: take 1 tablet twice daily for 3 days, then 1 tablet daily for one week, then stop.  4. We have scheduled you at [facility] for your modified barium swallow on Thursday 09/19/2014 at 1:00 pm. Please arrive 15 minutes prior and go to 1st floor radiology. If you need to reschedule for any reason please call [phone]. 5. We will call back with appointment for EEG and Botox injection.

## 2014-09-11 ENCOUNTER — Ambulatory Visit (INDEPENDENT_AMBULATORY_CARE_PROVIDER_SITE_OTHER): Payer: PPO | Admitting: Neurology

## 2014-09-11 ENCOUNTER — Telehealth: Payer: Self-pay | Admitting: Neurology

## 2014-09-11 DIAGNOSIS — R569 Unspecified convulsions: Secondary | ICD-10-CM

## 2014-09-11 NOTE — Telephone Encounter (Signed)
Spoke with Kevin Fenton and provided information about where the Myobloc would be injected. She will call with any other questions and work on prior authorization for medication.

## 2014-09-11 NOTE — Telephone Encounter (Signed)
[phone]) called in regards to this pt [name] about a procedure he was to have done and she said it was a expedited request/dawn

## 2014-09-12 ENCOUNTER — Telehealth: Payer: Self-pay | Admitting: *Deleted

## 2014-09-12 NOTE — Telephone Encounter (Signed)
Authorization has already been faxed to [place] for Myobloc injection. Patient preference is that injection added to f/u appt. They will call if they need this sooner.

## 2014-09-12 NOTE — Telephone Encounter (Signed)
[name] from [employer] reimbursements called to check on the status of forms and office notes requested last week Call back number [phone] [phone]. [phone]

## 2014-09-12 NOTE — Procedures (Signed)
ELECTROENCEPHALOGRAM REPORT  Date of Study: 09/11/2014  Patient's Name: [name] MRN: [id number] Date of Birth: 1940-08-26  Referring Provider: Dr. Wells Guiles Tat  Clinical History: This is a 74 year old man with a history of seizures, recently was sitting in the chair and his eyes appeared to roll back and he slumped over for a maximum of 3 seconds and then was completely normal again.  Medications: Cymbalta, Lisinopril, Propranolol  Technical Summary: A multichannel digital EEG recording measured by the international 10-20 system with electrodes applied with paste and impedances below 5000 ohms performed in our laboratory with EKG monitoring in an awake patient.  Hyperventilation was not performed. Photic stimulation was performed.  The digital EEG was referentially recorded, reformatted, and digitally filtered in a variety of bipolar and referential montages for optimal display.    Description: The patient is awake during the recording.  During maximal wakefulness, there is a symmetric, medium voltage 9 Hz posterior dominant rhythm that attenuates with eye opening.  The record is symmetric. There is movement article over the occipital leads due to tremor. There is also occasional chewing artifact seen with patient noted by technician to be chewing and vocalizing. Photic stimulation did not elicit any abnormalities.  There were no epileptiform discharges or electrographic seizures seen.    EKG lead was unremarkable.  Impression: This awake EEG is normal.    Clinical Correlation: A normal EEG does not exclude a clinical diagnosis of epilepsy.  If further clinical questions remain, prolonged EEG may be helpful.  Clinical correlation is advised.   Ellouise Newer, M.D.

## 2014-09-16 ENCOUNTER — Telehealth: Payer: Self-pay | Admitting: Neurology

## 2014-09-16 DIAGNOSIS — R569 Unspecified convulsions: Secondary | ICD-10-CM

## 2014-09-16 NOTE — Telephone Encounter (Signed)
Patient's wife made aware of results and aware of need for Amb EEG. Order placed and given to the front office to schedule.

## 2014-09-16 NOTE — Telephone Encounter (Signed)
-----   Message from [place], DO sent at 09/16/2014  7:35 AM EDT ----- Let pt/wife know that EEG looked fine.  Needs ambulatory

## 2014-09-16 NOTE — Telephone Encounter (Signed)
Tried to call patient with no answer and no voicemail set up. Will try again later.

## 2014-09-19 ENCOUNTER — Ambulatory Visit (HOSPITAL_COMMUNITY)
Admission: RE | Admit: 2014-09-19 | Discharge: 2014-09-19 | Disposition: A | Discharge status: Home / Self Care | Payer: PPO | Source: Ambulatory Visit | Attending: Neurology | Admitting: Neurology

## 2014-09-19 DIAGNOSIS — R1319 Other dysphagia: Secondary | ICD-10-CM

## 2014-09-19 DIAGNOSIS — R1310 Dysphagia, unspecified: Secondary | ICD-10-CM | POA: Diagnosis present

## 2014-09-19 DIAGNOSIS — R1314 Dysphagia, pharyngoesophageal phase: Secondary | ICD-10-CM

## 2014-09-19 DIAGNOSIS — R948 Abnormal results of function studies of other organs and systems: Secondary | ICD-10-CM | POA: Insufficient documentation
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
## 2014-10-10 ENCOUNTER — Ambulatory Visit (INDEPENDENT_AMBULATORY_CARE_PROVIDER_SITE_OTHER): Payer: PPO | Admitting: Neurology

## 2014-10-10 DIAGNOSIS — R569 Unspecified convulsions: Secondary | ICD-10-CM

## 2014-10-16 NOTE — Procedures (Signed)
ELECTROENCEPHALOGRAM REPORT  Dates of Recording: 10/10/2014 to 10/11/2014  Patient's Name: [name] MRN: [id number] Date of Birth: [birth year]  Referring Provider: Dr. Wells Guiles Tat  Procedure: 24-hour ambulatory EEG  History: This is a 74 year old man with a history of seizures, recently was sitting in the chair and his eyes appeared to roll back and he slumped over for a maximum of 3 seconds and then was completely normal again.  Medications: Cymbalta, Lisinopril, Propranolol  Technical Summary: This is a 24-hour multichannel digital EEG recording measured by the international 10-20 system with electrodes applied with paste and impedances below 5000 ohms performed as portable with EKG monitoring.  The digital EEG was referentially recorded, reformatted, and digitally filtered in a variety of bipolar and referential montages for optimal display.    DESCRIPTION OF RECORDING: During maximal wakefulness, the background activity consisted of a symmetric 10 Hz posterior dominant rhythm which was reactive to eye opening.  There were no epileptiform discharges or focal slowing seen in wakefulness.  During the recording, the patient progresses through wakefulness, drowsiness, and Stage 2 sleep.  Again, there were no epileptiform discharges seen.  Events: There were no push button events.  There were no electrographic seizures seen.  EKG lead was unremarkable.  IMPRESSION: This 24-hour ambulatory EEG study is normal.    CLINICAL CORRELATION: A normal EEG does not exclude a clinical diagnosis of epilepsy.  Typical events were not captured. If further clinical questions remain, inpatient video EEG monitoring may be helpful.   Ellouise Newer, M.D.

## 2014-10-17 ENCOUNTER — Telehealth: Payer: Self-pay | Admitting: Neurology

## 2014-10-17 NOTE — Telephone Encounter (Signed)
Patient's wife given information and instructions and said they may try cutting pill in half and taking it 6 x daily to see if it helps.

## 2014-10-17 NOTE — Telephone Encounter (Signed)
He reported that dizziness prior to me starting that medication last time.  Its probably from the disease itself.  If they think med made worse can take 1/2 tablet 6 times a day instead of 1 tablet 3 times a day

## 2014-10-17 NOTE — Telephone Encounter (Signed)
I spoke with patient's wife and she said that patient has been ok but feeling dizzy.  He saw his PCP yesterday and he said that it could be the carbidopa causing the dizziness.  Please advise.

## 2014-10-17 NOTE — Telephone Encounter (Signed)
Please let pt/wife know that his long EEG was okay and did not show any seizures.  Has he been okay since I saw him?  If so, I would hold off on adding more meds

## 2014-10-22 ENCOUNTER — Other Ambulatory Visit: Payer: PPO

## 2014-11-11 ENCOUNTER — Other Ambulatory Visit: Payer: PPO

## 2014-11-20 ENCOUNTER — Other Ambulatory Visit: Payer: Self-pay

## 2014-11-20 ENCOUNTER — Emergency Department: Payer: PPO

## 2014-11-20 ENCOUNTER — Emergency Department
Admission: EM | Admit: 2014-11-20 | Discharge: 2014-11-21 | Disposition: A | Discharge status: Home / Self Care | Payer: PPO | Attending: Emergency Medicine | Admitting: Emergency Medicine

## 2014-11-20 DIAGNOSIS — Z87891 Personal history of nicotine dependence: Secondary | ICD-10-CM | POA: Diagnosis not present

## 2014-11-20 DIAGNOSIS — I10 Essential (primary) hypertension: Secondary | ICD-10-CM | POA: Insufficient documentation

## 2014-11-20 DIAGNOSIS — Z79899 Other long term (current) drug therapy: Secondary | ICD-10-CM | POA: Diagnosis not present

## 2014-11-20 DIAGNOSIS — E119 Type 2 diabetes mellitus without complications: Secondary | ICD-10-CM | POA: Insufficient documentation

## 2014-11-20 DIAGNOSIS — Z880 Allergy status to penicillin: Secondary | ICD-10-CM | POA: Diagnosis not present

## 2014-11-20 DIAGNOSIS — J209 Acute bronchitis, unspecified: Secondary | ICD-10-CM | POA: Diagnosis not present

## 2014-11-20 DIAGNOSIS — R079 Chest pain, unspecified: Secondary | ICD-10-CM | POA: Diagnosis present

## 2014-11-20 LAB — BASIC METABOLIC PANEL
Anion gap: 12 (ref 5–15)
BUN: 16 mg/dL (ref 6–20)
CHLORIDE: 98 mmol/L — AB (ref 101–111)
CO2: 28 mmol/L (ref 22–32)
Calcium: 9.1 mg/dL (ref 8.9–10.3)
Creatinine, Ser: 0.76 mg/dL (ref 0.61–1.24)
GFR calc Af Amer: >60 mL/min (ref >60)
GFR calc non Af Amer: >60 mL/min (ref >60)
GLUCOSE: 116 mg/dL — AB (ref 65–99)
Potassium: 3.7 mmol/L (ref 3.5–5.1)
Sodium: 138 mmol/L (ref 135–145)

## 2014-11-20 LAB — CBC
HCT: 46.6 % (ref 40.0–52.0)
Hemoglobin: 15.7 g/dL (ref 13.0–18.0)
MCH: 28.8 pg (ref 26.0–34.0)
MCHC: 33.7 g/dL (ref 32.0–36.0)
MCV: 85.3 fL (ref 80.0–100.0)
PLATELETS: 291 10*3/uL (ref 150–440)
RBC: 5.46 MIL/uL (ref 4.40–5.90)
RDW: 13.5 % (ref 11.5–14.5)
WBC: 10.5 10*3/uL (ref 3.8–10.6)

## 2014-11-20 LAB — TROPONIN I: Troponin I: <0.03 ng/mL (ref <0.031)

## 2014-11-20 MED ORDER — IPRATROPIUM-ALBUTEROL 0.5-2.5 (3) MG/3ML IN SOLN
3.0000 mL | Freq: Once | RESPIRATORY_TRACT | Status: AC
  Administered 2014-11-20: 3 mL via RESPIRATORY_TRACT
  Filled 2014-11-20: qty 3

## 2014-11-20 NOTE — ED Notes (Signed)
Chest pain since this am, states to bilat ribs.  Has been coughing since last night, also has had 2 seizures today has hx of the same.

## 2014-11-20 NOTE — ED Provider Notes (Signed)
[facility] Emergency Department Provider Note  ____________________________________________  Time seen: 11:15 PM  I have reviewed the triage vital signs and the nursing notes.   HISTORY  Chief Complaint Chest Pain      HPI [name] is a 74 y.o. male presents with 2 day history of nonproductive cough, dyspnea and bilateral rib pain. Per patient and family has a history of seizure disorder and had a total of 3 seizures today with spontaneous resolution at home. Patient denies any fever no dizziness no nausea or vomiting.     Past Medical History  Diagnosis Date  . Depression   . Essential tremor   . Vitamin D deficiency   . Seizure disorder   . Hypertension   . Hyperlipidemia   . Diabetes   . Heart disease     There are no active problems to display for this patient.   Past Surgical History  Procedure Laterality Date  . Inguinal hernia repair Left as child    Current Outpatient Rx  Name  Route  Sig  Dispense  Refill  . benztropine (COGENTIN) 1 MG tablet   Oral   Take 1 mg by mouth 3 (three) times daily.         . carbidopa-levodopa (SINEMET IR) 25-100 MG per tablet   Oral   Take 1 tablet by mouth 3 (three) times daily.   90 tablet   3   . DULoxetine (CYMBALTA) 60 MG capsule   Oral   Take 120 mg by mouth daily.          Marland Kitchen lisinopril (PRINIVIL,ZESTRIL) 5 MG tablet   Oral   Take 5 mg by mouth daily.         . pravastatin (PRAVACHOL) 40 MG tablet   Oral   Take 40 mg by mouth daily.         . propranolol (INDERAL) 40 MG tablet   Oral   Take 40 mg by mouth 2 (two) times daily.            Allergies Levaquin and Penicillins  No family history on file.  Social History Social History  Substance Use Topics  . Smoking status: Former Smoker    Quit date: 09/04/2004  . Smokeless tobacco: Not on file  . Alcohol Use: No    Review of Systems  Constitutional: Negative for fever. Eyes: Negative for  visual changes. ENT: Negative for sore throat. Cardiovascular: Negative for chest pain. Respiratory: Positive for shortness of breath, cough Gastrointestinal: Negative for abdominal pain, vomiting and diarrhea. Genitourinary: Negative for dysuria. Musculoskeletal: Negative for back pain. Skin: Negative for rash. Neurological: Negative for headaches, focal weakness or numbness.   10-point ROS otherwise negative.  ____________________________________________   PHYSICAL EXAM:  VITAL SIGNS: ED Triage Vitals  Enc Vitals Group     BP 11/20/14 2250 106/68 mmHg     Pulse Rate 11/20/14 2250 115     Resp 11/20/14 2250 22     Temp 11/20/14 2251 97.5 F (36.4 C)     Temp Source 11/20/14 2251 Oral     SpO2 11/20/14 2250 100 %     Weight 11/20/14 2250 131 lb (59.421 kg)     Height --      Head Cir --      Peak Flow --      Pain Score 11/20/14 2250 10     Pain Loc --      Pain Edu? --  Excl. in [place]? --      Constitutional: Alert and oriented. Well appearing and in no distress. Eyes: Conjunctivae are normal. PERRL. Normal extraocular movements. ENT   Head: Normocephalic and atraumatic.   Nose: No congestion/rhinnorhea.   Mouth/Throat: Mucous membranes are moist.   Neck: No stridor. Cardiovascular: Normal rate, regular rhythm. Normal and symmetric distal pulses are present in all extremities. No murmurs, rubs, or gallops. Respiratory: Normal respiratory effort without tachypnea nor retractions. Breath sounds are clear and equal bilaterally. No wheezes/rales/rhonchi. Gastrointestinal: Soft and nontender. No distention. There is no CVA tenderness. Genitourinary: deferred Musculoskeletal: Nontender with normal range of motion in all extremities. No joint effusions.  No lower extremity tenderness nor edema. Neurologic:  Normal speech and language. No gross focal neurologic deficits are appreciated. Speech is normal.  Skin:  Skin is warm, dry and intact. No rash  noted. Psychiatric: Mood and affect are normal. Speech and behavior are normal. Patient exhibits appropriate insight and judgment.  ____________________________________________    LABS (pertinent positives/negatives)  Labs Reviewed  BASIC METABOLIC PANEL - Abnormal; Notable for the following:    Chloride 98 (*)    Glucose, Bld 116 (*)    All other components within normal limits  CBC  TROPONIN I     ____________________________________________   EKG  ED ECG REPORT I, Shiri Hodapp, [place], the attending physician, personally viewed and interpreted this ECG.   Date: 11/21/2014  EKG Time: 10:50 PM  Rate: 119  Rhythm: Sinus tachycardia  Axis: None  Intervals: Normal  ST&T Change: None   ____________________________________________    RADIOLOGY CT Angio Chest PE W/Cm &/Or Wo Cm (Final result) Result time: 11/21/14 01:35:30   Final result by Rad Results In Interface (11/21/14 01:35:30)   Narrative:   CLINICAL DATA: Chest pain for 1 day. Seizures earlier today  EXAM: CT ANGIOGRAPHY CHEST WITH CONTRAST  TECHNIQUE: Multidetector CT imaging of the chest was performed using the standard protocol during bolus administration of intravenous contrast. Multiplanar CT image reconstructions and MIPs were obtained to evaluate the vascular anatomy.  CONTRAST: 181mL OMNIPAQUE IOHEXOL 350 MG/ML SOLN  COMPARISON: Chest CT February 19, 2014 and chest radiograph November 20, 2014  FINDINGS: There is no demonstrable pulmonary embolus. There is no thoracic aortic aneurysm or dissection. There is mild atherosclerotic change in the aortic arch region. The visualized great vessels appear unremarkable.  There is a degree of underlying centrilobular emphysematous change. There is mild bibasilar atelectatic change. There is a slight degree of upper and lower lobe bronchiectatic change. There is no edema or consolidation.  There is diffuse enlargement of the left lobe of the  thyroid with a dominant mass measuring 3.6 x 2.3 cm.  There is no appreciable thoracic adenopathy. There is calcification in the left anterior descending coronary artery. There is left ventricular hypertrophy. The pericardium is not thickened. There is a small hiatal hernia.  In the visualized upper abdomen, there remains mild left adrenal hypertrophy. Visualized upper abdominal structures otherwise appear unremarkable. There are no blastic or lytic bone lesions. There is degenerative change in the thoracic spine with diffuse idiopathic skeletal hyperostosis.  Review of the MIP images confirms the above findings.  IMPRESSION: No demonstrable pulmonary embolus.  Underlying centrilobular emphysematous change with mild bronchiectatic change. No edema or consolidation. Mild bibasilar atelectasis.  Diffuse enlargement of the left lobe of the thyroid with a dominant left lobe thyroid mass. Consider further evaluation with thyroid ultrasound. If patient is clinically hyperthyroid, consider nuclear medicine thyroid uptake  and scan.  Coronary artery calcification. Left ventricular hypertrophy. Small hiatal hernia. No appreciable adenopathy. Stable mild left adrenal hypertrophy.   Electronically Signed By: Lowella Grip III M.D. On: 11/21/2014 01:35          DG Chest 2 View (Final result) Result time: 11/21/14 00:16:40   Final result by Rad Results In Interface (11/21/14 00:16:40)   Narrative:   CLINICAL DATA: Acute onset of bilateral rib pain. Cough. Seizures. Initial encounter.  EXAM: CHEST 2 VIEW  COMPARISON: Chest radiograph performed 05/13/2014  FINDINGS: The lungs are well-aerated and clear. There is no evidence of focal opacification, pleural effusion or pneumothorax.  The heart is normal in size; the mediastinal contour is within normal limits. No acute osseous abnormalities are seen. An apparent stable bone island is noted overlying the lower  left scapula.  IMPRESSION: No acute cardiopulmonary process seen.   Electronically Signed By: Garald Balding M.D. On: 11/21/2014 00:16     INITIAL IMPRESSION / ASSESSMENT AND PLAN / ED COURSE  Pertinent labs & imaging results that were available during my care of the patient were reviewed by me and considered in my medical decision making (see chart for details).  She received DuoNeb in the emergency department with symptom improvement. History of physical exam consistent with chest wall pain secondary to cough most likely due to bronchitis.  ____________________________________________   FINAL CLINICAL IMPRESSION(S) / ED DIAGNOSES  Final diagnoses:  Acute bronchitis, unspecified organism       Gregor Hams, MD 11/21/14 [phone]

## 2014-11-21 ENCOUNTER — Telehealth: Payer: Self-pay | Admitting: Emergency Medicine

## 2014-11-21 ENCOUNTER — Encounter: Payer: Self-pay | Admitting: Radiology

## 2014-11-21 ENCOUNTER — Emergency Department: Payer: PPO

## 2014-11-21 MED ORDER — ACETAMINOPHEN-CODEINE #2 300-15 MG PO TABS: 1.0000 | ORAL_TABLET | ORAL | Status: DC | PRN

## 2014-11-21 MED ORDER — AZITHROMYCIN 500 MG PO TABS: 500.0000 mg | ORAL_TABLET | Freq: Every day | ORAL | Status: AC

## 2014-11-21 MED ORDER — IOHEXOL 350 MG/ML SOLN
100.0000 mL | Freq: Once | INTRAVENOUS | Status: AC | PRN
  Administered 2014-11-21: 100 mL via INTRAVENOUS

## 2014-11-21 NOTE — Discharge Instructions (Signed)

## 2014-11-21 NOTE — ED Notes (Signed)
[facility] called and asked to change tylenol #2 to tylenol #3, as they do not have the #2.  i told them to change to tylenol # 3 per dr Corky Downs.

## 2014-12-06 ENCOUNTER — Ambulatory Visit: Payer: PPO | Admitting: Neurology

## 2014-12-11 ENCOUNTER — Encounter: Payer: Self-pay | Admitting: Neurology

## 2014-12-11 ENCOUNTER — Ambulatory Visit (INDEPENDENT_AMBULATORY_CARE_PROVIDER_SITE_OTHER): Payer: PPO | Admitting: Neurology

## 2014-12-11 VITALS — BP 126/80 | HR 80 | Temp 97.6°F

## 2014-12-11 DIAGNOSIS — G232 Striatonigral degeneration: Secondary | ICD-10-CM | POA: Diagnosis not present

## 2014-12-11 MED ORDER — CARBIDOPA-LEVODOPA 25-100 MG PO TABS: 1.5000 | ORAL_TABLET | Freq: Three times a day (TID) | ORAL | Status: DC

## 2014-12-11 NOTE — Addendum Note (Signed)
Addended byAnnamaria Helling on: 12/11/2014 10:01 AM   Modules accepted: Orders

## 2014-12-11 NOTE — Progress Notes (Signed)
[name] was seen today in the movement disorders clinic for neurologic consultation at the request of  Dr. Melrose Nakayama for a second opinion.  His primary care physician is Dr. Marguerita Merles, MD.  The consultation is for the evaluation of tremor.  Notes from Dr. Melrose Nakayama were reviewed.  The patient's wife of 5 years also supplements the history.  Dr. Melrose Nakayama indicates that he felt that the patient's tremor is likely from chronic neuroleptic use in the past.  Notes from Dr. Melrose Nakayama indicates that the patient was on Seroquel, which appears that this was discontinued in early part of 2016.  His wife indicates that he was also on abilify and that was d/c in Jan 2016.  His wife doesn't remember the seroquel but that was what records indicate.  Wife states that it was abilify and he was on it x 1.5 years.  He was also on Depakote when Dr. Melrose Nakayama first began to see the patient, but is off of that now.  He is on Cogentin, 1 mg 3 times per day.  Pt states that it was started by Dr. Melrose Nakayama on 06/18/14 and pts wife states that no benefit.  Things may have gotten worse with it.  Wife states that tremor has been going on for 1.5 years.  It started with tremor in both hands.  Tremor has significantly picked up in both hands.  His wife states that he is now grunting and it happens all day long.  That has been going on for 3 months as has the head "bobbing."   They are unaware if neuroimaging has been performed.  He did have an EEG in January, 2015 that was reported to be normal.  His wife does mention a history of seizure.  She states that it started over 20 years ago and she really does not know the details of it.  She does note that he was in the hospital for a long time and when he came out he had to relearn to do everything.  She states that he had been seizure-free for well over a year until a few weeks ago when he was sitting in the chair and his eyes appeared to roll back and he slumped over for a maximum of 3  seconds and then was completely normal again.  12/11/14 update:  The patient is following up today, accompanied by his wife who supplements the history.  Last visit, I took the patient off of Cogentin because of memory issues, gait changes and dry mouth.  We added levodopa.  This was done on 09/05/2014.  They called me on July 7 to state that the patient was more dizzy so we changed this from one tablet 3 times per day to half tablet 6 times per day.  His wife states that he ended up going to 1 tablet tid and they realized that the initial dizziness was from a bought of bronchitis.  His wife states that he has actually done much better with the medication.  Much less tremor.  No falls.  Much less agitation.  Asks if we can try and increase it again. He did have a routine EEG and an ambulatory EEG since our last visit as his wife had believed he had a reemergence of seizure.  He had a modified barium swallow done on 09/19/2014; this demonstrated a moderate oral phase and mild cervical phase dysphagia and a dysphagia 3 (mechanical soft) diet with thin liquids was recommended.  They have  been doing that.  He was supposed to have myobloc done today for sialorrhea but decided not to have that done today.    ALLERGIES:   Allergies  Allergen Reactions  . Levaquin [Levofloxacin In D5w] Nausea And Vomiting  . Penicillins Hives    CURRENT MEDICATIONS:  Outpatient Encounter Prescriptions as of 12/11/2014  Medication Sig  . acetaminophen (TYLENOL) 325 MG tablet Take 650 mg by mouth every 6 (six) hours as needed.  Marland Kitchen acetaminophen-codeine (TYLENOL #2) 300-15 MG per tablet Take 1 tablet by mouth every 4 (four) hours as needed for moderate pain.  . benzonatate (TESSALON) 100 MG capsule Take 100 mg by mouth 3 (three) times daily as needed for cough.  . benztropine (COGENTIN) 1 MG tablet Take 1 mg by mouth 3 (three) times daily.  . carbidopa-levodopa (SINEMET IR) 25-100 MG per tablet Take 1 tablet by mouth 3 (three)  times daily.  . DULoxetine (CYMBALTA) 60 MG capsule Take 120 mg by mouth daily.   Marland Kitchen lisinopril (PRINIVIL,ZESTRIL) 5 MG tablet Take 5 mg by mouth daily.  . pravastatin (PRAVACHOL) 40 MG tablet Take 40 mg by mouth daily.  . propranolol (INDERAL) 40 MG tablet Take 40 mg by mouth 2 (two) times daily.    No facility-administered encounter medications on file as of 12/11/2014.    PAST MEDICAL HISTORY:   Past Medical History  Diagnosis Date  . Depression   . Essential tremor   . Vitamin D deficiency   . Seizure disorder   . Hypertension   . Hyperlipidemia   . Diabetes   . Heart disease     PAST SURGICAL HISTORY:   Past Surgical History  Procedure Laterality Date  . Inguinal hernia repair Left as child    SOCIAL HISTORY:   Social History   Social History  . Marital Status: Married    Spouse Name: N/A  . Number of Children: N/A  . Years of Education: N/A   Occupational History  . Not on file.   Social History Main Topics  . Smoking status: Former Smoker    Quit date: 09/04/2004  . Smokeless tobacco: Not on file  . Alcohol Use: No  . Drug Use: No  . Sexual Activity: Not on file   Other Topics Concern  . Not on file   Social History Narrative    FAMILY HISTORY:   Family Status  Relation Status Death Age  . Father Deceased     cancer, emphysema  . Mother Deceased     depression  . Brother Alive   . Sister Alive   . Brother Deceased     ROS:  A complete 10 system review of systems was obtained and was unremarkable apart from what is mentioned above.  PHYSICAL EXAMINATION:    VITALS:   Filed Vitals:   12/11/14 0854  BP: 126/80  Pulse: 80  Temp: 97.6 F (36.4 C)  TempSrc: Oral    GEN:  The patient appears stated age and is in NAD. HEENT:  Normocephalic, atraumatic.  The mucous membranes are very dry. The superficial temporal arteries are without ropiness or tenderness.  The tongue moves about in the mouth.  It does not come out of the mouth. CV:   RRR Lungs:  CTAB Neck/HEME:  There are no carotid bruits bilaterally.  The head/neck is held in a flexed manner with the chin close to the chest.  He has difficulty lifting the head to a neutral position.  Neurological examination:  Orientation: The  patient is able to correctly identify the month, year and day of the week.   Cranial nerves: There is good facial symmetry.  There is significant facial hypomimia.   There is very little spontaneity of speech, but when he speaks it is mildly dysarthric but fluent.  He grunts throughout the entire examination.  Soft palate rises symmetrically and there is no tongue deviation. Hearing is intact to conversational tone. Sensation: Sensation is intact to light touch throughout. Motor: Strength is 5/5 in the bilateral upper and lower extremities with the exception of the arm abductors bilaterally and strength there is 4/5 due to pain in the shoulder.   Shoulder shrug is equal and symmetric.  There is no pronator drift.   Movement examination: Tone: There is normal tone in the upper extremities bilaterally. Abnormal movements: There is a near constant bilateral upper extremity resting tremor, moderate in nature.  It goes away when the patient uses his hands. Coordination:  There is mild decremation with RAM's, seen with hand opening and closing just slightly and finger taps. Gait and Station: The patient has mild difficulty getting out of the year.  He is short stepped but no freezing.    ASSESSMENT/PLAN:  1.  Multiple system atrophy  -Long discussion with the patient and his wife today.  Much greater than 50% of the 25 minute visit in counseling.  Talked to them again about the fact that I think he has multiple system atrophy.  He has neck flexion, dysphagia, parkinsonism and possible orthostasis.    -I'm going to increase carbidopa/levodopa 25/100, 1.5 tablets 3 times per day.  Risks, benefits, side effects and alternative therapies were discussed.  The  opportunity to ask questions was given and they were answered to the best of my ability.  The patient expressed understanding and willingness to follow the outlined treatment protocols.  -He/wife state that they want to hold on therapy as had in past with dr. Melrose Nakayama prior to coming here.  May address again next visit as he will have been with me for 6 months.  Told he needed to use walker (has 3 of them) instead of cane. 2.  Dysphagia  -He had a modified barium swallow done on 09/19/2014; this demonstrated a moderate oral phase and mild cervical phase dysphagia and a dysphagia 3 (mechanical soft) diet with thin liquids was recommended.  He is following this.  He and I discussed risks of aspiration pneumonia today.   3.  Sialorrhea  -He did not do well with cogentin.  He decided to hold on myobloc.   4.  Tardive dyskinesia  -The patient does have some of this in the tongue.  I suspect that this is, in fact, from long-standing use of anti-psychotic medication.  While tardive parkinsonism is rare, tardive dyskinesia is not rare.  He is not bothered by this. 5.  Pseudobulbar affect  -Better right now and decided to hold on further meds 6.  History of seizure  -His EEG and ambulatory EEG were negative and no seizures since last visit. 7.  Lightheadedness  -This certainly may represent orthostasis associated with MSA and we will need to watch this closely.   Is better than last visit. 8.  Dementia  -The patient does not participate completely with neurocognitive testing today.  9.  Follow-up with me will be in the next few months, sooner should new neurologic issues arise.

## 2014-12-23 ENCOUNTER — Encounter: Payer: Self-pay | Admitting: Emergency Medicine

## 2014-12-23 ENCOUNTER — Emergency Department
Admission: EM | Admit: 2014-12-23 | Discharge: 2014-12-23 | Disposition: A | Discharge status: Home / Self Care | Payer: PPO | Attending: Emergency Medicine | Admitting: Emergency Medicine

## 2014-12-23 DIAGNOSIS — F458 Other somatoform disorders: Secondary | ICD-10-CM

## 2014-12-23 DIAGNOSIS — Z79899 Other long term (current) drug therapy: Secondary | ICD-10-CM | POA: Diagnosis not present

## 2014-12-23 DIAGNOSIS — I10 Essential (primary) hypertension: Secondary | ICD-10-CM | POA: Diagnosis not present

## 2014-12-23 DIAGNOSIS — Z87891 Personal history of nicotine dependence: Secondary | ICD-10-CM | POA: Insufficient documentation

## 2014-12-23 DIAGNOSIS — Z880 Allergy status to penicillin: Secondary | ICD-10-CM | POA: Insufficient documentation

## 2014-12-23 DIAGNOSIS — E119 Type 2 diabetes mellitus without complications: Secondary | ICD-10-CM | POA: Insufficient documentation

## 2014-12-23 DIAGNOSIS — R0989 Other specified symptoms and signs involving the circulatory and respiratory systems: Secondary | ICD-10-CM | POA: Diagnosis present

## 2014-12-23 MED ORDER — GLUCAGON HCL RDNA (DIAGNOSTIC) 1 MG IJ SOLR
INTRAMUSCULAR | Status: AC
  Administered 2014-12-23: 1 mg via INTRAVENOUS
  Filled 2014-12-23: qty 1

## 2014-12-23 MED ORDER — GLUCAGON HCL (RDNA) 1 MG IJ SOLR
1.0000 mg | Freq: Once | INTRAMUSCULAR | Status: AC
  Administered 2014-12-23: 1 mg via INTRAVENOUS
  Filled 2014-12-23: qty 1

## 2014-12-23 MED ORDER — GI COCKTAIL ~~LOC~~
30.0000 mL | Freq: Once | ORAL | Status: DC
  Filled 2014-12-23: qty 30

## 2014-12-23 MED ORDER — LORAZEPAM 2 MG/ML IJ SOLN
1.0000 mg | Freq: Once | INTRAMUSCULAR | Status: AC
  Administered 2014-12-23: 1 mg via INTRAVENOUS
  Filled 2014-12-23: qty 1

## 2014-12-23 NOTE — Discharge Instructions (Signed)
Please seek medical attention for any high fevers, chest pain, shortness of breath, change in behavior, persistent vomiting, bloody stool or any other new or concerning symptoms.   Globus Syndrome Globus Syndrome is a feeling of a lump or a sensation of something caught in your throat. Eating food or drinking fluids does not seem to get rid of it. Yet it is not noticeable during the actual act of swallowing food or liquids. Usually there is nothing physically wrong. It is troublesome because it is an unpleasant sensation which is sometimes difficult to ignore and at times may seem to worsen. The syndrome is quite common. It is estimated 45% of the population experiences features of the condition at some stage during their lives. The symptoms are usually temporary. The largest group of people who feel the need to seek medical treatment is females between the ages of 55 to 20.  CAUSES  Globus Syndrome appears to be triggered by or aggravated by stress, anxiety and depression.  Tension related to stress could product abnormal muscle spasms in the esophagus which would account for the sensation of a lump or ball in your throat.  Frequent swallowing or drying of the throat caused by anxiety or other strong emotions can also produce this uncomfortable sensation in your throat.  Fear and sadness can be expressed by the body in many ways. For instance, if you had a relative with throat cancer you might become overly concerned about your own health and develop uncomfortable sensations in your throat.  The reaction to a crisis or a trauma event in your life can take the form of a lump in your throat. It is as if you are indirectly saying you can not handle or "swallow" one more thing. DIAGNOSIS  Usually your caregiver will know what is wrong by talking to you and examining you. If the condition persists for several days, more testing may be done to make sure there is not another problem present. This is  usually not the case. TREATMENT   Reassurance is often the best treatment available. Usually the problem leaves without treatment over several days.  Sometimes anti-anxiety medications may be prescribed.  Counseling or talk therapy can also help with strong underlying emotions.  Note that in most cases this is not something that keeps coming back and you should not be concerned or worried. Document Released: 06/19/2003 Document Revised: 06/21/2011 Document Reviewed: 11/16/2007 [facility] Patient Information 2015 [place], [place]. This information is not intended to replace advice given to you by your health care provider. Make sure you discuss any questions you have with your health care provider.

## 2014-12-23 NOTE — ED Provider Notes (Signed)
[facility] Emergency Department Provider Note   ____________________________________________  Time seen: 1328  I have reviewed the triage vital signs and the nursing notes.   HISTORY  Chief Complaint Swallowed Foreign Body   History limited by: Not Limited   HPI [name] is a 74 y.o. male who presents to the emergency department today with concerns for foreign body and throat. Patient states he was out mowing the lawn when he felt like something got in his throat. He feels like it is stuck in the bottom part of his neck. He denies any difficulty with breathing. He denies any difficulty with swallowing. He states it feels like sandpaper. He has had a history of slight dysphagia in the past which has been worked up.     Past Medical History  Diagnosis Date  . Depression   . Essential tremor   . Vitamin D deficiency   . Seizure disorder   . Hypertension   . Hyperlipidemia   . Diabetes   . Heart disease     There are no active problems to display for this patient.   Past Surgical History  Procedure Laterality Date  . Inguinal hernia repair Left as child    Current Outpatient Rx  Name  Route  Sig  Dispense  Refill  . acetaminophen (TYLENOL) 325 MG tablet   Oral   Take 650 mg by mouth every 6 (six) hours as needed.         Marland Kitchen acetaminophen-codeine (TYLENOL #2) 300-15 MG per tablet   Oral   Take 1 tablet by mouth every 4 (four) hours as needed for moderate pain.   20 tablet   0   . benzonatate (TESSALON) 100 MG capsule   Oral   Take 100 mg by mouth 3 (three) times daily as needed for cough.         . benztropine (COGENTIN) 1 MG tablet   Oral   Take 1 mg by mouth 3 (three) times daily.         . carbidopa-levodopa (SINEMET IR) 25-100 MG per tablet   Oral   Take 1.5 tablets by mouth 3 (three) times daily.   135 tablet   5   . DULoxetine (CYMBALTA) 60 MG capsule   Oral   Take 120 mg by mouth daily.          Marland Kitchen  lisinopril (PRINIVIL,ZESTRIL) 5 MG tablet   Oral   Take 5 mg by mouth daily.         . pravastatin (PRAVACHOL) 40 MG tablet   Oral   Take 40 mg by mouth daily.         . propranolol (INDERAL) 40 MG tablet   Oral   Take 40 mg by mouth 2 (two) times daily.            Allergies Levaquin and Penicillins  No family history on file.  Social History Social History  Substance Use Topics  . Smoking status: Former Smoker    Quit date: 09/04/2004  . Smokeless tobacco: None  . Alcohol Use: No    Review of Systems  Constitutional: Negative for fever. Cardiovascular: Negative for chest pain. Respiratory: Negative for shortness of breath. Gastrointestinal: Negative for abdominal pain, vomiting and diarrhea. Genitourinary: Negative for dysuria. Musculoskeletal: Negative for back pain. Skin: Negative for rash. Neurological: Negative for headaches, focal weakness or numbness.  10-point ROS otherwise negative.  ____________________________________________   PHYSICAL EXAM:  VITAL SIGNS: ED Triage Vitals  Enc Vitals Group     BP 12/23/14 1329 179/103 mmHg     Pulse Rate 12/23/14 1329 106     Resp 12/23/14 1329 26     Temp --      Temp src --      SpO2 12/23/14 1329 100 %     Weight 12/23/14 1329 131 lb (59.421 kg)     Height 12/23/14 1329 5\' 9"  (1.753 m)   Constitutional: Alert and oriented. Slightly anxious. Eyes: Conjunctivae are normal. PERRL. Normal extraocular movements. ENT   Head: Normocephalic and atraumatic.   Nose: No congestion/rhinnorhea.   Mouth/Throat: Mucous membranes are moist.   Neck: No stridor. Hematological/Lymphatic/Immunilogical: No cervical lymphadenopathy. Cardiovascular: Normal rate, regular rhythm.  No murmurs, rubs, or gallops. Respiratory: Normal respiratory effort without tachypnea nor retractions. Breath sounds are clear and equal bilaterally. No wheezes/rales/rhonchi. Gastrointestinal: Soft and nontender. No distention.   Genitourinary: Deferred Musculoskeletal: Normal range of motion in all extremities. No joint effusions.  No lower extremity tenderness nor edema. Neurologic:  Normal speech and language. No gross focal neurologic deficits are appreciated. Speech is normal.  Skin:  Skin is warm, dry and intact. No rash noted. Psychiatric: Mood and affect are normal. Speech and behavior are normal. Patient exhibits appropriate insight and judgment.  ____________________________________________    LABS (pertinent positives/negatives)  None  ____________________________________________   EKG  None  ____________________________________________    RADIOLOGY  None  ____________________________________________   PROCEDURES  Procedure(s) performed: None  Critical Care performed: No  ____________________________________________   INITIAL IMPRESSION / ASSESSMENT AND PLAN / ED COURSE  Pertinent labs & imaging results that were available during my care of the patient were reviewed by me and considered in my medical decision making (see chart for details).  Patient presents to the emergency department with concern for foreign body in throat. On exam patient was initially quite anxious. He was given glucagon and Ativan. On reassessment patient much calmer. He had been able to drink and is in no respiratory distress. He stated he felt better and would like to go home. At this point again patient is able to control secretions, able to swallow water easily. Able to breathe without any difficulty.  ____________________________________________   FINAL CLINICAL IMPRESSION(S) / ED DIAGNOSES  Final diagnoses:  Globus hystericus     Nance Pear, MD 12/23/14 [phone]

## 2014-12-23 NOTE — ED Notes (Signed)
Pt was mowing the lawn and states something flew into his mouth and now he states he is having trouble swallowing and breathing. Pt very anxious upon arrival to ed. Pt coughing at this time.

## 2015-01-05 ENCOUNTER — Emergency Department: Payer: PPO

## 2015-01-05 ENCOUNTER — Encounter: Payer: Self-pay | Admitting: Emergency Medicine

## 2015-01-05 ENCOUNTER — Emergency Department
Admission: EM | Admit: 2015-01-05 | Discharge: 2015-01-05 | Disposition: A | Discharge status: Home / Self Care | Payer: PPO | Attending: Emergency Medicine | Admitting: Emergency Medicine

## 2015-01-05 DIAGNOSIS — E119 Type 2 diabetes mellitus without complications: Secondary | ICD-10-CM | POA: Insufficient documentation

## 2015-01-05 DIAGNOSIS — M545 Low back pain, unspecified: Secondary | ICD-10-CM

## 2015-01-05 DIAGNOSIS — Z79899 Other long term (current) drug therapy: Secondary | ICD-10-CM | POA: Insufficient documentation

## 2015-01-05 DIAGNOSIS — Z880 Allergy status to penicillin: Secondary | ICD-10-CM | POA: Insufficient documentation

## 2015-01-05 DIAGNOSIS — Z87891 Personal history of nicotine dependence: Secondary | ICD-10-CM | POA: Insufficient documentation

## 2015-01-05 DIAGNOSIS — M5431 Sciatica, right side: Secondary | ICD-10-CM | POA: Diagnosis not present

## 2015-01-05 DIAGNOSIS — I10 Essential (primary) hypertension: Secondary | ICD-10-CM | POA: Diagnosis not present

## 2015-01-05 MED ORDER — KETOROLAC TROMETHAMINE 10 MG PO TABS: 10.0000 mg | ORAL_TABLET | Freq: Four times a day (QID) | ORAL | Status: DC | PRN

## 2015-01-05 MED ORDER — OXYCODONE HCL 5 MG PO TABS
5.0000 mg | ORAL_TABLET | Freq: Once | ORAL | Status: AC
  Administered 2015-01-05: 5 mg via ORAL
  Filled 2015-01-05: qty 1

## 2015-01-05 MED ORDER — OXYCODONE HCL 5 MG PO TABS: 5.0000 mg | ORAL_TABLET | Freq: Three times a day (TID) | ORAL | Status: DC | PRN

## 2015-01-05 MED ORDER — KETOROLAC TROMETHAMINE 60 MG/2ML IM SOLN
30.0000 mg | Freq: Once | INTRAMUSCULAR | Status: AC
  Administered 2015-01-05: 30 mg via INTRAMUSCULAR
  Filled 2015-01-05: qty 2

## 2015-01-05 NOTE — ED Notes (Signed)
No relief of pain. PA notified.

## 2015-01-05 NOTE — ED Notes (Signed)
Pt woke up Thursday c/o back pain. Denies injury. Hx muscular disorder. Pain medication administered per order. States unable to urinate at this time. Cup of water given and instructed to make aware when able to urinate.

## 2015-01-05 NOTE — ED Provider Notes (Signed)
[facility] Emergency Department Provider Note ____________________________________________  Time seen: Approximately 8:13 AM  I have reviewed the triage vital signs and the nursing notes.   HISTORY  Chief Complaint Back Pain   HPI [name] is a 74 y.o. male resents to the emergency department for evaluation of acute onset low back pain. He has no history of injury. He has never had this type of pain before pain is on the lower back with some radiation into the right leg. Pain started yesterday morning and has worsened. He is now not able to stand.   Past Medical History  Diagnosis Date  . Depression   . Essential tremor   . Vitamin D deficiency   . Seizure disorder   . Hypertension   . Hyperlipidemia   . Diabetes   . Heart disease     There are no active problems to display for this patient.   Past Surgical History  Procedure Laterality Date  . Inguinal hernia repair Left as child    Current Outpatient Rx  Name  Route  Sig  Dispense  Refill  . acetaminophen (TYLENOL) 325 MG tablet   Oral   Take 650 mg by mouth every 6 (six) hours as needed.         Marland Kitchen acetaminophen-codeine (TYLENOL #2) 300-15 MG per tablet   Oral   Take 1 tablet by mouth every 4 (four) hours as needed for moderate pain.   20 tablet   0   . benzonatate (TESSALON) 100 MG capsule   Oral   Take 100 mg by mouth 3 (three) times daily as needed for cough.         . benztropine (COGENTIN) 1 MG tablet   Oral   Take 1 mg by mouth 3 (three) times daily.         . carbidopa-levodopa (SINEMET IR) 25-100 MG per tablet   Oral   Take 1.5 tablets by mouth 3 (three) times daily.   135 tablet   5   . DULoxetine (CYMBALTA) 60 MG capsule   Oral   Take 120 mg by mouth daily.          Marland Kitchen ketorolac (TORADOL) 10 MG tablet   Oral   Take 1 tablet (10 mg total) by mouth every 6 (six) hours as needed.   20 tablet   0   . lisinopril (PRINIVIL,ZESTRIL) 5 MG  tablet   Oral   Take 5 mg by mouth daily.         Marland Kitchen oxyCODONE (ROXICODONE) 5 MG immediate release tablet   Oral   Take 1 tablet (5 mg total) by mouth every 8 (eight) hours as needed.   12 tablet   0   . pravastatin (PRAVACHOL) 40 MG tablet   Oral   Take 40 mg by mouth daily.         . propranolol (INDERAL) 40 MG tablet   Oral   Take 40 mg by mouth 2 (two) times daily.            Allergies Levaquin and Penicillins  No family history on file.  Social History Social History  Substance Use Topics  . Smoking status: Former Smoker    Quit date: 09/04/2004  . Smokeless tobacco: None  . Alcohol Use: No    Review of Systems Constitutional: No recent illness. Eyes: No visual changes. ENT: No sore throat. Cardiovascular: Denies chest pain or palpitations. Respiratory: Denies shortness of breath. Gastrointestinal: No  abdominal pain.  Genitourinary: Negative for dysuria. Musculoskeletal: Pain in low back. Skin: Negative for rash. Neurological: Negative for headaches, focal weakness or numbness. 10-point ROS otherwise negative.  ____________________________________________   PHYSICAL EXAM:  VITAL SIGNS: ED Triage Vitals  Enc Vitals Group     BP 01/05/15 0753 150/101 mmHg     Pulse Rate 01/05/15 0753 83     Resp 01/05/15 0753 24     Temp 01/05/15 0753 97.7 F (36.5 C)     Temp Source 01/05/15 0753 Oral     SpO2 01/05/15 0753 100 %     Weight 01/05/15 0753 131 lb (59.421 kg)     Height 01/05/15 0753 5\' 9"  (1.753 m)     Head Cir --      Peak Flow --      Pain Score 01/05/15 0754 9     Pain Loc --      Pain Edu? --      Excl. in [place]? --     Constitutional: Alert and oriented. Ill appearing and in no acute distress. Eyes: Conjunctivae are normal. EOMI. Head: Atraumatic. Nose: No congestion/rhinnorhea. Neck: No stridor.  Respiratory: Normal respiratory effort.   Musculoskeletal: Difficult to assess the lower back due to the amount of pain patient is  experiencing. Patient is seated in a wheelchair and has significant pain with any movement. There is no obvious trauma or deformity. Neurologic:  Normal speech and language. No gross focal neurologic deficits are appreciated. Speech is normal. No gait instability. Skin:  Skin is warm, dry and intact. Atraumatic. Psychiatric: Mood and affect are normal. Speech and behavior are normal.  ____________________________________________   LABS (all labs ordered are listed, but only abnormal results are displayed)  Labs Reviewed - No data to display ____________________________________________  RADIOLOGY  Lumbar spine film negative for acute bony abnormality. ____________________________________________   PROCEDURES  Procedure(s) performed: None   ____________________________________________   INITIAL IMPRESSION / ASSESSMENT AND PLAN / ED COURSE  Pertinent labs & imaging results that were available during my care of the patient were reviewed by me and considered in my medical decision making (see chart for details).  IM Toradol and by mouth Roxicodone had not decreased pain, so the patient was moved to room 9 for further evaluation. After some time and before he was evaluated by the M.D., the wife reported to the staff that he was feeling better and he was ready to go home.  Upon reexamination of the patient he appeared to feel much more comfortable and was able to stand. He'll be discharged home with Toradol and Roxicodone. He was advised to follow-up with his primary care provider this week or the orthopedic doctor if his choice. ____________________________________________   FINAL CLINICAL IMPRESSION(S) / ED DIAGNOSES  Final diagnoses:  Acute lumbar back pain  Sciatica, right       Victorino Dike, FNP 01/05/15 1321  Carrie Mew, MD 01/05/15 1553

## 2015-01-05 NOTE — ED Notes (Signed)
Lower Back pain since last Thursday denies any injury to area.

## 2015-02-17 ENCOUNTER — Emergency Department
Admission: EM | Admit: 2015-02-17 | Discharge: 2015-02-17 | Disposition: A | Discharge status: Home / Self Care | Payer: PPO | Attending: Emergency Medicine | Admitting: Emergency Medicine

## 2015-02-17 ENCOUNTER — Emergency Department: Payer: PPO

## 2015-02-17 DIAGNOSIS — Z79899 Other long term (current) drug therapy: Secondary | ICD-10-CM | POA: Insufficient documentation

## 2015-02-17 DIAGNOSIS — E119 Type 2 diabetes mellitus without complications: Secondary | ICD-10-CM | POA: Diagnosis not present

## 2015-02-17 DIAGNOSIS — R0602 Shortness of breath: Secondary | ICD-10-CM | POA: Insufficient documentation

## 2015-02-17 DIAGNOSIS — Z880 Allergy status to penicillin: Secondary | ICD-10-CM | POA: Diagnosis not present

## 2015-02-17 DIAGNOSIS — Z87891 Personal history of nicotine dependence: Secondary | ICD-10-CM | POA: Diagnosis not present

## 2015-02-17 DIAGNOSIS — R079 Chest pain, unspecified: Secondary | ICD-10-CM | POA: Diagnosis not present

## 2015-02-17 DIAGNOSIS — R1110 Vomiting, unspecified: Secondary | ICD-10-CM | POA: Insufficient documentation

## 2015-02-17 DIAGNOSIS — I519 Heart disease, unspecified: Secondary | ICD-10-CM | POA: Insufficient documentation

## 2015-02-17 DIAGNOSIS — I10 Essential (primary) hypertension: Secondary | ICD-10-CM | POA: Insufficient documentation

## 2015-02-17 LAB — COMPREHENSIVE METABOLIC PANEL
ALK PHOS: 91 U/L (ref 38–126)
ALT: 10 U/L — AB (ref 17–63)
AST: 20 U/L (ref 15–41)
Albumin: 4 g/dL (ref 3.5–5.0)
Anion gap: 7 (ref 5–15)
BUN: 12 mg/dL (ref 6–20)
CALCIUM: 9.1 mg/dL (ref 8.9–10.3)
CO2: 28 mmol/L (ref 22–32)
CREATININE: 0.77 mg/dL (ref 0.61–1.24)
Chloride: 103 mmol/L (ref 101–111)
GFR calc Af Amer: >60 mL/min (ref >60)
Glucose, Bld: 161 mg/dL — ABNORMAL HIGH (ref 65–99)
Potassium: 3 mmol/L — ABNORMAL LOW (ref 3.5–5.1)
Sodium: 138 mmol/L (ref 135–145)
Total Bilirubin: 0.6 mg/dL (ref 0.3–1.2)
Total Protein: 7.2 g/dL (ref 6.5–8.1)

## 2015-02-17 LAB — CBC
HEMATOCRIT: 46.1 % (ref 40.0–52.0)
HEMOGLOBIN: 15.4 g/dL (ref 13.0–18.0)
MCH: 28.3 pg (ref 26.0–34.0)
MCHC: 33.5 g/dL (ref 32.0–36.0)
MCV: 84.3 fL (ref 80.0–100.0)
Platelets: 239 10*3/uL (ref 150–440)
RBC: 5.47 MIL/uL (ref 4.40–5.90)
RDW: 13.1 % (ref 11.5–14.5)
WBC: 10.4 10*3/uL (ref 3.8–10.6)

## 2015-02-17 LAB — TROPONIN I

## 2015-02-17 MED ORDER — SODIUM CHLORIDE 0.9 % IV BOLUS (SEPSIS)
500.0000 mL | Freq: Once | INTRAVENOUS | Status: AC
  Administered 2015-02-17: 500 mL via INTRAVENOUS

## 2015-02-17 MED ORDER — LORAZEPAM 1 MG PO TABS
1.0000 mg | ORAL_TABLET | Freq: Once | ORAL | Status: AC
  Administered 2015-02-17: 1 mg via ORAL
  Filled 2015-02-17: qty 1

## 2015-02-17 MED ORDER — POTASSIUM CHLORIDE 20 MEQ PO PACK
40.0000 meq | PACK | Freq: Once | ORAL | Status: AC
  Administered 2015-02-17: 40 meq via ORAL

## 2015-02-17 MED ORDER — POTASSIUM CHLORIDE 20 MEQ PO PACK
40.0000 meq | PACK | Freq: Two times a day (BID) | ORAL | Status: DC
  Filled 2015-02-17: qty 2

## 2015-02-17 MED ORDER — NITROGLYCERIN 0.4 MG SL SUBL
0.4000 mg | SUBLINGUAL_TABLET | SUBLINGUAL | Status: DC | PRN
  Administered 2015-02-17: 0.4 mg via SUBLINGUAL
  Filled 2015-02-17: qty 1

## 2015-02-17 NOTE — Discharge Instructions (Signed)
Nonspecific Chest Pain  °Chest pain can be caused by many different conditions. There is always a chance that your pain could be related to something serious, such as a heart attack or a blood clot in your lungs. Chest pain can also be caused by conditions that are not life-threatening. If you have chest pain, it is very important to follow up with your health care provider. °CAUSES  °Chest pain can be caused by: °· Heartburn. °· Pneumonia or bronchitis. °· Anxiety or stress. °· Inflammation around your heart (pericarditis) or lung (pleuritis or pleurisy). °· A blood clot in your lung. °· A collapsed lung (pneumothorax). It can develop suddenly on its own (spontaneous pneumothorax) or from trauma to the chest. °· Shingles infection (varicella-zoster virus). °· Heart attack. °· Damage to the bones, muscles, and cartilage that make up your chest wall. This can include: °¨ Bruised bones due to injury. °¨ Strained muscles or cartilage due to frequent or repeated coughing or overwork. °¨ Fracture to one or more ribs. °¨ Sore cartilage due to inflammation (costochondritis). °RISK FACTORS  °Risk factors for chest pain may include: °· Activities that increase your risk for trauma or injury to your chest. °· Respiratory infections or conditions that cause frequent coughing. °· Medical conditions or overeating that can cause heartburn. °· Heart disease or family history of heart disease. °· Conditions or health behaviors that increase your risk of developing a blood clot. °· Having had chicken pox (varicella zoster). °SIGNS AND SYMPTOMS °Chest pain can feel like: °· Burning or tingling on the surface of your chest or deep in your chest. °· Crushing, pressure, aching, or squeezing pain. °· Dull or sharp pain that is worse when you move, cough, or take a deep breath. °· Pain that is also felt in your back, neck, shoulder, or arm, or pain that spreads to any of these areas. °Your chest pain may come and go, or it may stay  constant. °DIAGNOSIS °Lab tests or other studies may be needed to find the cause of your pain. Your health care provider may have you take a test called an ambulatory ECG (electrocardiogram). An ECG records your heartbeat patterns at the time the test is performed. You may also have other tests, such as: °· Transthoracic echocardiogram (TTE). During echocardiography, sound waves are used to create a picture of all of the heart structures and to look at how blood flows through your heart. °· Transesophageal echocardiogram (TEE). This is a more advanced imaging test that obtains images from inside your body. It allows your health care provider to see your heart in finer detail. °· Cardiac monitoring. This allows your health care provider to monitor your heart rate and rhythm in real time. °· Holter monitor. This is a portable device that records your heartbeat and can help to diagnose abnormal heartbeats. It allows your health care provider to track your heart activity for several days, if needed. °· Stress tests. These can be done through exercise or by taking medicine that makes your heart beat more quickly. °· Blood tests. °· Imaging tests. °TREATMENT  °Your treatment depends on what is causing your chest pain. Treatment may include: °· Medicines. These may include: °¨ Acid blockers for heartburn. °¨ Anti-inflammatory medicine. °¨ Pain medicine for inflammatory conditions. °¨ Antibiotic medicine, if an infection is present. °¨ Medicines to dissolve blood clots. °¨ Medicines to treat coronary artery disease. °· Supportive care for conditions that do not require medicines. This may include: °¨ Resting. °¨ Applying heat   or cold packs to injured areas. °¨ Limiting activities until pain decreases. °HOME CARE INSTRUCTIONS °· If you were prescribed an antibiotic medicine, finish it all even if you start to feel better. °· Avoid any activities that bring on chest pain. °· Do not use any tobacco products, including  cigarettes, chewing tobacco, or electronic cigarettes. If you need help quitting, ask your health care provider. °· Do not drink alcohol. °· Take medicines only as directed by your health care provider. °· Keep all follow-up visits as directed by your health care provider. This is important. This includes any further testing if your chest pain does not go away. °· If heartburn is the cause for your chest pain, you may be told to keep your head raised (elevated) while sleeping. This reduces the chance that acid will go from your stomach into your esophagus. °· Make lifestyle changes as directed by your health care provider. These may include: °¨ Getting regular exercise. Ask your health care provider to suggest some activities that are safe for you. °¨ Eating a heart-healthy diet. A registered dietitian can help you to learn healthy eating options. °¨ Maintaining a healthy weight. °¨ Managing diabetes, if necessary. °¨ Reducing stress. °SEEK MEDICAL CARE IF: °· Your chest pain does not go away after treatment. °· You have a rash with blisters on your chest. °· You have a fever. °SEEK IMMEDIATE MEDICAL CARE IF:  °· Your chest pain is worse. °· You have an increasing cough, or you cough up blood. °· You have severe abdominal pain. °· You have severe weakness. °· You faint. °· You have chills. °· You have sudden, unexplained chest discomfort. °· You have sudden, unexplained discomfort in your arms, back, neck, or jaw. °· You have shortness of breath at any time. °· You suddenly start to sweat, or your skin gets clammy. °· You feel nauseous or you vomit. °· You suddenly feel light-headed or dizzy. °· Your heart begins to beat quickly, or it feels like it is skipping beats. °These symptoms may represent a serious problem that is an emergency. Do not wait to see if the symptoms will go away. Get medical help right away. Call your local emergency services (911 in the [place] Do not drive yourself to the hospital. °  °This  information is not intended to replace advice given to you by your health care provider. Make sure you discuss any questions you have with your health care provider. °  °Document Released: 01/06/2005 Document Revised: 04/19/2014 Document Reviewed: 11/02/2013 °[employer]vier Interactive Patient Education ©2016 [employer] ° °

## 2015-02-17 NOTE — ED Notes (Signed)
Pt c/o left upper chest pain that started about 2330; pains has resolved without medication since then but pt says "it just scared me"; pt with tremor noted in triage-says it's from a muscle disorder but not Parkinson's; pt says he got short of breath; nausea no vomiting;

## 2015-02-17 NOTE — ED Notes (Signed)
Pt appears anxious in room.  Pt smacking lips and letting out small grunts.  Pts limbs shaking.

## 2015-02-17 NOTE — ED Notes (Signed)
Patient transported to X-ray 

## 2015-02-17 NOTE — ED Provider Notes (Signed)
[facility] Emergency Department Provider Note  ____________________________________________  Time seen: Approximately 0050 AM  I have reviewed the triage vital signs and the nursing notes.   HISTORY  Chief Complaint Chest Pain    HPI Mivaan Corbitt is a 74 y.o. male who comes into the hospital today with chest pain. The patient's wife reports that he went to bed at 11 PM and woke up at 1120 with chest pain. She reports that he vomited and had some phlegm and he seemed to be gasping for air. He has had similar pain before. The patient did not take anything for pain. The patient also endorses some shortness of breath but denies dizziness or lightheadedness. According to the patient's wife he had a seizure when he got out of the car and does have a history of seizure disorder. She reports that he felt okay today and had not had any complaints. The patient gets choked a lot but does not have a history of MI.The patient reports that he does to have some mild chest pain at this time and is very shaky. Patient rates his pain a 5 out of 10.   Past Medical History  Diagnosis Date  . Depression   . Essential tremor   . Vitamin D deficiency   . Seizure disorder   . Hypertension   . Hyperlipidemia   . Diabetes   . Heart disease     There are no active problems to display for this patient.   Past Surgical History  Procedure Laterality Date  . Inguinal hernia repair Left as child    Current Outpatient Rx  Name  Route  Sig  Dispense  Refill  . acetaminophen (TYLENOL) 325 MG tablet   Oral   Take 650 mg by mouth every 6 (six) hours as needed.         Marland Kitchen acetaminophen-codeine (TYLENOL #2) 300-15 MG per tablet   Oral   Take 1 tablet by mouth every 4 (four) hours as needed for moderate pain.   20 tablet   0   . benzonatate (TESSALON) 100 MG capsule   Oral   Take 100 mg by mouth 3 (three) times daily as needed for cough.         . benztropine  (COGENTIN) 1 MG tablet   Oral   Take 1 mg by mouth 3 (three) times daily.         . carbidopa-levodopa (SINEMET IR) 25-100 MG per tablet   Oral   Take 1.5 tablets by mouth 3 (three) times daily.   135 tablet   5   . DULoxetine (CYMBALTA) 60 MG capsule   Oral   Take 120 mg by mouth daily.          Marland Kitchen ketorolac (TORADOL) 10 MG tablet   Oral   Take 1 tablet (10 mg total) by mouth every 6 (six) hours as needed.   20 tablet   0   . lisinopril (PRINIVIL,ZESTRIL) 5 MG tablet   Oral   Take 5 mg by mouth daily.         Marland Kitchen oxyCODONE (ROXICODONE) 5 MG immediate release tablet   Oral   Take 1 tablet (5 mg total) by mouth every 8 (eight) hours as needed.   12 tablet   0   . pravastatin (PRAVACHOL) 40 MG tablet   Oral   Take 40 mg by mouth daily.         . propranolol (INDERAL) 40 MG  tablet   Oral   Take 40 mg by mouth 2 (two) times daily.            Allergies Levaquin and Penicillins  No family history on file.  Social History Social History  Substance Use Topics  . Smoking status: Former Smoker    Quit date: 09/04/2004  . Smokeless tobacco: Not on file  . Alcohol Use: No    Review of Systems Constitutional: No fever/chills Eyes: No visual changes. ENT: No sore throat. Cardiovascular:  chest pain. Respiratory:  shortness of breath. Gastrointestinal: No abdominal pain.  No nausea, no vomiting.  No diarrhea.  No constipation. Genitourinary: Negative for dysuria. Musculoskeletal: Negative for back pain. Skin: Negative for rash. Neurological: Negative for headaches, focal weakness or numbness.  10-point ROS otherwise negative.  ____________________________________________   PHYSICAL EXAM:  VITAL SIGNS: ED Triage Vitals  Enc Vitals Group     BP 02/17/15 0028 187/95 mmHg     Pulse Rate 02/17/15 0028 110     Resp 02/17/15 0028 22     Temp 02/17/15 0028 98.2 F (36.8 C)     Temp Source 02/17/15 0028 Oral     SpO2 02/17/15 0028 100 %     Weight  02/17/15 0028 132 lb (59.875 kg)     Height 02/17/15 0028 5\' 9"  (1.753 m)     Head Cir --      Peak Flow --      Pain Score 02/17/15 0029 0     Pain Loc --      Pain Edu? --      Excl. in [place]? --     Constitutional: Alert and oriented. Well appearing and in mild distress. Eyes: Conjunctivae are normal. PERRL. EOMI. Head: Atraumatic. Nose: No congestion/rhinnorhea. Mouth/Throat: Mucous membranes are moist.  Oropharynx non-erythematous. Cardiovascular: Normal rate, regular rhythm. Grossly normal heart sounds.  Good peripheral circulation. Respiratory: Tachypnea, Normal respiratory effort.  No retractions. Lungs CTAB. Gastrointestinal: Soft and nontender. No distention. Positive bowel sounds Musculoskeletal: No lower extremity tenderness nor edema.   Neurologic:  Normal speech and language. No gross focal neurologic deficits are appreciated.  Skin:  Skin is warm, dry and intact. No rash noted. Psychiatric: Mood and affect are normal.   ____________________________________________   LABS (all labs ordered are listed, but only abnormal results are displayed)  Labs Reviewed  COMPREHENSIVE METABOLIC PANEL - Abnormal; Notable for the following:    Potassium 3.0 (*)    Glucose, Bld 161 (*)    ALT 10 (*)    All other components within normal limits  CBC  TROPONIN I  TROPONIN I   ____________________________________________  EKG  ED ECG REPORT I, Loney Hering, the attending physician, personally viewed and interpreted this ECG.   Date: 02/17/2015  EKG Time: 231  Rate: 93  Rhythm: normal sinus rhythm  Axis: normal  Intervals:none  ST&T Change: st depression leads II, III, avf same as 8/16  ____________________________________________  RADIOLOGY  Chest x-ray: No acute pulmonary process ____________________________________________   PROCEDURES  Procedure(s) performed: None  Critical Care performed:  No  ____________________________________________   INITIAL IMPRESSION / ASSESSMENT AND PLAN / ED COURSE  Pertinent labs & imaging results that were available during my care of the patient were reviewed by me and considered in my medical decision making (see chart for details).  This is a 73 year old male who comes in today with some chest pain. The patient is very shaky and does also have a history of  anxiety. I initially gave the patient some Ativan as his EKGs have lots of artifact and were not able to be determined and read well by me. The patient's chest pain had resolved and his shakiness also improved. I also gave the patient a dose of potassium for his hypokalemia. When the patient did receive an EKG when he was not shaky it was the same as it had been previously with some depressions inferiorly but no ST segment elevations. The patient had 2 sets of cardiac enzymes that were both negative. I will discharge the patient home as he feels well and have him follow up with his primary care physician. ____________________________________________   FINAL CLINICAL IMPRESSION(S) / ED DIAGNOSES  Final diagnoses:  Chest pain, unspecified chest pain type      Loney Hering, MD 02/17/15 [phone]

## 2015-02-17 NOTE — ED Notes (Signed)
Pt uses urinal at bedside

## 2015-03-13 ENCOUNTER — Ambulatory Visit (INDEPENDENT_AMBULATORY_CARE_PROVIDER_SITE_OTHER): Payer: PPO | Admitting: Neurology

## 2015-03-13 ENCOUNTER — Encounter: Payer: Self-pay | Admitting: Neurology

## 2015-03-13 VITALS — BP 142/92 | HR 104 | Ht 69.0 in | Wt 147.0 lb

## 2015-03-13 DIAGNOSIS — R1310 Dysphagia, unspecified: Secondary | ICD-10-CM

## 2015-03-13 DIAGNOSIS — G903 Multi-system degeneration of the autonomic nervous system: Secondary | ICD-10-CM

## 2015-03-13 DIAGNOSIS — K5900 Constipation, unspecified: Secondary | ICD-10-CM

## 2015-03-13 DIAGNOSIS — G239 Degenerative disease of basal ganglia, unspecified: Secondary | ICD-10-CM

## 2015-03-13 DIAGNOSIS — Z515 Encounter for palliative care: Secondary | ICD-10-CM | POA: Diagnosis not present

## 2015-03-13 NOTE — Progress Notes (Signed)
[name] was seen today in the movement disorders clinic for neurologic consultation at the request of  Dr. Melrose Nakayama for a second opinion.  His primary care physician is Dr. Marguerita Merles, MD.  The consultation is for the evaluation of tremor.  Notes from Dr. Melrose Nakayama were reviewed.  The patient's wife of 5 years also supplements the history.  Dr. Melrose Nakayama indicates that he felt that the patient's tremor is likely from chronic neuroleptic use in the past.  Notes from Dr. Melrose Nakayama indicates that the patient was on Seroquel, which appears that this was discontinued in early part of 2016.  His wife indicates that he was also on abilify and that was d/c in Jan 2016.  His wife doesn't remember the seroquel but that was what records indicate.  Wife states that it was abilify and he was on it x 1.5 years.  He was also on Depakote when Dr. Melrose Nakayama first began to see the patient, but is off of that now.  He is on Cogentin, 1 mg 3 times per day.  Pt states that it was started by Dr. Melrose Nakayama on 06/18/14 and pts wife states that no benefit.  Things may have gotten worse with it.  Wife states that tremor has been going on for 1.5 years.  It started with tremor in both hands.  Tremor has significantly picked up in both hands.  His wife states that he is now grunting and it happens all day long.  That has been going on for 3 months as has the head "bobbing."   They are unaware if neuroimaging has been performed.  He did have an EEG in January, 2015 that was reported to be normal.  His wife does mention a history of seizure.  She states that it started over 20 years ago and she really does not know the details of it.  She does note that he was in the hospital for a long time and when he came out he had to relearn to do everything.  She states that he had been seizure-free for well over a year until a few weeks ago when he was sitting in the chair and his eyes appeared to roll back and he slumped over for a maximum of 3  seconds and then was completely normal again.  12/11/14 update:  The patient is following up today, accompanied by his wife who supplements the history.  Last visit, I took the patient off of Cogentin because of memory issues, gait changes and dry mouth.  We added levodopa.  This was done on 09/05/2014.  They called me on July 7 to state that the patient was more dizzy so we changed this from one tablet 3 times per day to half tablet 6 times per day.  His wife states that he ended up going to 1 tablet tid and they realized that the initial dizziness was from a bought of bronchitis.  His wife states that he has actually done much better with the medication.  Much less tremor.  No falls.  Much less agitation.  Asks if we can try and increase it again. He did have a routine EEG and an ambulatory EEG since our last visit as his wife had believed he had a reemergence of seizure.  He had a modified barium swallow done on 09/19/2014; this demonstrated a moderate oral phase and mild cervical phase dysphagia and a dysphagia 3 (mechanical soft) diet with thin liquids was recommended.  They have  been doing that.  He was supposed to have myobloc done today for sialorrhea but decided not to have that done today.    03/13/15 update:  The patient is following up today, accompanied by his wife who supplements the history.  Last visit, increase his carbidopa/levodopa 25/100, so that he was taking 1-1/2 tablets 3 times per day.  He admits that he isn't taking his medication at all, including his psychiatry meds (cymbalta).    I encouraged him to use walker at all times.  He hasn't done that.  In the past, he has refused formal therapy.  I have reviewed records since his last visit with me.  He went to the emergency room several times.  He went on September 12.  After mowing the lawn, he felt like he had swallowed something and had a foreign body sensation in the throat.  The workup was negative and he was released.  He also went on  September 25 with low back pain and November 7 with chest pain.  The workup was negative and he was not admitted for these.  He has had more trouble swallowing.  He admits that he eats whatever he wants and doesn't want to follow the speech therapy diet.  He does state that he wants to be DNR.  He had a fall since last visit.  He was outside looking for a kitten and he fell looking for it.    ALLERGIES:   Allergies  Allergen Reactions  . Levaquin [Levofloxacin In D5w] Nausea And Vomiting  . Penicillins Hives    CURRENT MEDICATIONS:  Outpatient Encounter Prescriptions as of 03/13/2015  Medication Sig  . acetaminophen (TYLENOL) 325 MG tablet Take 650 mg by mouth every 6 (six) hours as needed.  Marland Kitchen acetaminophen-codeine (TYLENOL #2) 300-15 MG per tablet Take 1 tablet by mouth every 4 (four) hours as needed for moderate pain.  . benztropine (COGENTIN) 1 MG tablet Take 1 mg by mouth 3 (three) times daily.  . carbidopa-levodopa (SINEMET IR) 25-100 MG per tablet Take 1.5 tablets by mouth 3 (three) times daily.  . DULoxetine (CYMBALTA) 60 MG capsule Take 120 mg by mouth daily.   Marland Kitchen lisinopril (PRINIVIL,ZESTRIL) 5 MG tablet Take 5 mg by mouth daily.  . pravastatin (PRAVACHOL) 40 MG tablet Take 40 mg by mouth daily.  . propranolol (INDERAL) 40 MG tablet Take 40 mg by mouth 2 (two) times daily.   . [DISCONTINUED] benzonatate (TESSALON) 100 MG capsule Take 100 mg by mouth 3 (three) times daily as needed for cough.  . [DISCONTINUED] ketorolac (TORADOL) 10 MG tablet Take 1 tablet (10 mg total) by mouth every 6 (six) hours as needed.  . [DISCONTINUED] oxyCODONE (ROXICODONE) 5 MG immediate release tablet Take 1 tablet (5 mg total) by mouth every 8 (eight) hours as needed.   No facility-administered encounter medications on file as of 03/13/2015.    PAST MEDICAL HISTORY:   Past Medical History  Diagnosis Date  . Depression   . Essential tremor   . Vitamin D deficiency   . Seizure disorder   .  Hypertension   . Hyperlipidemia   . Diabetes   . Heart disease     PAST SURGICAL HISTORY:   Past Surgical History  Procedure Laterality Date  . Inguinal hernia repair Left as child    SOCIAL HISTORY:   Social History   Social History  . Marital Status: Married    Spouse Name: N/A  . Number of Children: N/A  .  Years of Education: N/A   Occupational History  . Not on file.   Social History Main Topics  . Smoking status: Former Smoker    Quit date: 09/04/2004  . Smokeless tobacco: Not on file  . Alcohol Use: No  . Drug Use: No  . Sexual Activity: Not on file   Other Topics Concern  . Not on file   Social History Narrative    FAMILY HISTORY:   Family Status  Relation Status Death Age  . Father Deceased     cancer, emphysema  . Mother Deceased     depression  . Brother Alive   . Sister Alive   . Brother Deceased     ROS:  A complete 10 system review of systems was obtained and was unremarkable apart from what is mentioned above.  PHYSICAL EXAMINATION:    VITALS:   Filed Vitals:   03/13/15 1355  BP: 142/92  Pulse: 104  Height: 5\' 9"  (1.753 m)  Weight: 147 lb (66.679 kg)    GEN:  The patient appears stated age and is in NAD. HEENT:  Normocephalic, atraumatic.  The mucous membranes are very dry. The superficial temporal arteries are without ropiness or tenderness.  The tongue moves about in the mouth.  It does not come out of the mouth. CV:  RRR Lungs:  CTAB.  He has inspiratory gasps. Neck/HEME:  There are no carotid bruits bilaterally.  The head/neck is held in a flexed manner with the chin close to the chest.  He has difficulty lifting the head to a neutral position.  Neurological examination:  Orientation: The patient is able to correctly identify the month, year and day of the week.   Cranial nerves: There is good facial symmetry.  There is significant facial hypomimia.   His speech is fluent and clear.  He grunts throughout the entire examination.   Soft palate rises symmetrically and there is no tongue deviation. Hearing is intact to conversational tone. Sensation: Sensation is intact to light touch throughout. Motor: Strength is at least antigravity 4.   Movement examination: Tone: There is mild increased tone in the bilateral upper extremities. Abnormal movements: There is a near constant bilateral upper extremity resting tremor, moderate in nature.  It goes away when the patient uses his hands. Coordination:  There is mild decremation with RAM's, seen with hand opening and closing just slightly and finger taps. Gait and Station: The patient has mild difficulty getting out of the chair.  He is short stepped but no freezing.    ASSESSMENT/PLAN:  1.  Multiple system atrophy  -Long discussion with the patient and his wife today.  Much greater than 50% of the 40 minute visit in counseling.  Talked to them again about the fact that I think he has multiple system atrophy.  He has neck flexion, dysphagia, parkinsonism and possible orthostasis.    -He is supposed to be on carbidopa/levodopa 25/100, 1.5 tablets 3 times per day but he has d/c his medications.  I told him that is certainly his right, but talked to him about risks versus benefits.  Ultimately, he stated that he would go back on the medication.  -Talked to him extensively about end-of-life issues.  Patient reported that he would like to be DO NOT RESUSCITATE.  I gave him an advanced directive information for them to fill out and sent back.  -He has refused physical therapy yet again today 2.  Dysphagia  -He had a modified barium swallow done  on 09/19/2014; this demonstrated a moderate oral phase and mild cervical phase dysphagia and a dysphagia 3 (mechanical soft) diet with thin liquids was recommended.  He is not following this.  He and I discussed risks of aspiration pneumonia today.  He understands his increased risk of morbidity and mortality. 3.  Sialorrhea  -He did not do  well with cogentin.  He decided to hold on myobloc.   4.  Tardive dyskinesia  -The patient does have some of this in the tongue.  I suspect that this is, in fact, from long-standing use of anti-psychotic medication.  While tardive parkinsonism is rare, tardive dyskinesia is not rare.  He is not bothered by this. 5.  Pseudobulbar affect  -Better right now and decided to hold on further meds 6.  History of seizure  -His EEG and ambulatory EEG were negative and no seizures since last visit. 7.  Lightheadedness  -This certainly may represent orthostasis associated with MSA and we will need to watch this closely.   Is better than last visit. 8.  Dementia  -The patient does not participate completely with neurocognitive testing today.  9.  Follow-up with me will be in the next few months, sooner should new neurologic issues arise.

## 2015-03-13 NOTE — Patient Instructions (Signed)
You have a diagnosis of multiple system atrophy (abbreviated MSA)

## 2015-05-03 ENCOUNTER — Emergency Department: Payer: PPO

## 2015-05-03 ENCOUNTER — Encounter: Payer: Self-pay | Admitting: Emergency Medicine

## 2015-05-03 ENCOUNTER — Emergency Department
Admission: EM | Admit: 2015-05-03 | Discharge: 2015-05-03 | Disposition: A | Discharge status: Home / Self Care | Payer: PPO | Attending: Emergency Medicine | Admitting: Emergency Medicine

## 2015-05-03 DIAGNOSIS — E119 Type 2 diabetes mellitus without complications: Secondary | ICD-10-CM | POA: Diagnosis not present

## 2015-05-03 DIAGNOSIS — Z87891 Personal history of nicotine dependence: Secondary | ICD-10-CM | POA: Diagnosis not present

## 2015-05-03 DIAGNOSIS — Z79899 Other long term (current) drug therapy: Secondary | ICD-10-CM | POA: Insufficient documentation

## 2015-05-03 DIAGNOSIS — N23 Unspecified renal colic: Secondary | ICD-10-CM | POA: Insufficient documentation

## 2015-05-03 DIAGNOSIS — I10 Essential (primary) hypertension: Secondary | ICD-10-CM | POA: Diagnosis not present

## 2015-05-03 DIAGNOSIS — N201 Calculus of ureter: Secondary | ICD-10-CM | POA: Diagnosis not present

## 2015-05-03 DIAGNOSIS — Z880 Allergy status to penicillin: Secondary | ICD-10-CM | POA: Insufficient documentation

## 2015-05-03 DIAGNOSIS — R350 Frequency of micturition: Secondary | ICD-10-CM | POA: Diagnosis not present

## 2015-05-03 DIAGNOSIS — N50812 Left testicular pain: Secondary | ICD-10-CM | POA: Diagnosis not present

## 2015-05-03 DIAGNOSIS — R52 Pain, unspecified: Secondary | ICD-10-CM

## 2015-05-03 LAB — BASIC METABOLIC PANEL
Anion gap: 8 (ref 5–15)
BUN: 12 mg/dL (ref 6–20)
CHLORIDE: 104 mmol/L (ref 101–111)
CO2: 29 mmol/L (ref 22–32)
CREATININE: 0.67 mg/dL (ref 0.61–1.24)
Calcium: 9.1 mg/dL (ref 8.9–10.3)
GFR calc Af Amer: >60 mL/min (ref >60)
GFR calc non Af Amer: >60 mL/min (ref >60)
GLUCOSE: 95 mg/dL (ref 65–99)
POTASSIUM: 3.5 mmol/L (ref 3.5–5.1)
SODIUM: 141 mmol/L (ref 135–145)

## 2015-05-03 LAB — URINALYSIS COMPLETE WITH MICROSCOPIC (ARMC ONLY)
Bacteria, UA: NONE SEEN
Bilirubin Urine: NEGATIVE
GLUCOSE, UA: NEGATIVE mg/dL
Ketones, ur: NEGATIVE mg/dL
LEUKOCYTES UA: NEGATIVE
Nitrite: NEGATIVE
Protein, ur: NEGATIVE mg/dL
SPECIFIC GRAVITY, URINE: 1.013 (ref 1.005–1.030)
Squamous Epithelial / LPF: NONE SEEN
pH: 5 (ref 5.0–8.0)

## 2015-05-03 LAB — CBC WITH DIFFERENTIAL/PLATELET
Basophils Absolute: 0.3 10*3/uL — ABNORMAL HIGH (ref 0–0.1)
Basophils Relative: 3 %
EOS ABS: 0.2 10*3/uL (ref 0–0.7)
EOS PCT: 2 %
HCT: 46.7 % (ref 40.0–52.0)
Hemoglobin: 15.5 g/dL (ref 13.0–18.0)
LYMPHS ABS: 1.5 10*3/uL (ref 1.0–3.6)
LYMPHS PCT: 13 %
MCH: 27 pg (ref 26.0–34.0)
MCHC: 33.2 g/dL (ref 32.0–36.0)
MCV: 81.3 fL (ref 80.0–100.0)
MONO ABS: 0.8 10*3/uL (ref 0.2–1.0)
MONOS PCT: 7 %
Neutro Abs: 8.3 10*3/uL — ABNORMAL HIGH (ref 1.4–6.5)
Neutrophils Relative %: 75 %
PLATELETS: 225 10*3/uL (ref 150–440)
RBC: 5.75 MIL/uL (ref 4.40–5.90)
RDW: 14 % (ref 11.5–14.5)
WBC: 11.1 10*3/uL — AB (ref 3.8–10.6)

## 2015-05-03 LAB — HEPATIC FUNCTION PANEL
ALBUMIN: 4.2 g/dL (ref 3.5–5.0)
ALK PHOS: 81 U/L (ref 38–126)
ALT: 18 U/L (ref 17–63)
AST: 20 U/L (ref 15–41)
BILIRUBIN TOTAL: 0.8 mg/dL (ref 0.3–1.2)
Bilirubin, Direct: 0.2 mg/dL (ref 0.1–0.5)
Indirect Bilirubin: 0.6 mg/dL (ref 0.3–0.9)
TOTAL PROTEIN: 7.6 g/dL (ref 6.5–8.1)

## 2015-05-03 MED ORDER — HYDROMORPHONE HCL 1 MG/ML IJ SOLN
INTRAMUSCULAR | Status: AC
  Administered 2015-05-03: 0.5 mg via INTRAVENOUS
  Filled 2015-05-03: qty 1

## 2015-05-03 MED ORDER — ONDANSETRON HCL 4 MG/2ML IJ SOLN
4.0000 mg | Freq: Once | INTRAMUSCULAR | Status: AC
  Administered 2015-05-03: 4 mg via INTRAVENOUS

## 2015-05-03 MED ORDER — HYDROCODONE-ACETAMINOPHEN 5-325 MG PO TABS: 2.0000 | ORAL_TABLET | Freq: Four times a day (QID) | ORAL | Status: DC | PRN

## 2015-05-03 MED ORDER — HYDROMORPHONE HCL 1 MG/ML IJ SOLN
0.5000 mg | Freq: Once | INTRAMUSCULAR | Status: AC
  Administered 2015-05-03: 0.5 mg via INTRAVENOUS

## 2015-05-03 MED ORDER — ONDANSETRON HCL 4 MG/2ML IJ SOLN
INTRAMUSCULAR | Status: AC
  Administered 2015-05-03: 4 mg via INTRAVENOUS
  Filled 2015-05-03: qty 2

## 2015-05-03 NOTE — ED Notes (Addendum)
Bladder scan: 24 ml to 40ml

## 2015-05-03 NOTE — ED Provider Notes (Signed)
[facility] Emergency Department Provider Note  ____________________________________________  Time seen: Approximately 6:08 PM  I have reviewed the triage vital signs and the nursing notes.   HISTORY  Chief Complaint Urinary Retention and Penis Pain    HPI [name] is a 75 y.o. male reports onset of pain in his penis and left testicle this morning after he went to urinate. He reports the pain is severe it makes her feel like he has to [facility] a little bit and then almost immediately feels like he has to go again. He does not have any pain elsewhere in his body he says he has not noticed any blood in the urine there is some dysuria associated with the urgency and frequency is having. He has not ever had this before. The pain is constant.   Past Medical History  Diagnosis Date  . Depression   . Essential tremor   . Vitamin D deficiency   . Seizure disorder ([place])   . Hypertension   . Hyperlipidemia   . Diabetes ([place])   . Heart disease     There are no active problems to display for this patient.   Past Surgical History  Procedure Laterality Date  . Inguinal hernia repair Left as child    Current Outpatient Rx  Name  Route  Sig  Dispense  Refill  . acetaminophen (TYLENOL) 325 MG tablet   Oral   Take 650 mg by mouth every 6 (six) hours as needed.         Marland Kitchen acetaminophen-codeine (TYLENOL #2) 300-15 MG per tablet   Oral   Take 1 tablet by mouth every 4 (four) hours as needed for moderate pain.   20 tablet   0   . benztropine (COGENTIN) 1 MG tablet   Oral   Take 1 mg by mouth 3 (three) times daily.         . carbidopa-levodopa (SINEMET IR) 25-100 MG per tablet   Oral   Take 1.5 tablets by mouth 3 (three) times daily.   135 tablet   5   . DULoxetine (CYMBALTA) 60 MG capsule   Oral   Take 120 mg by mouth daily.          Marland Kitchen HYDROcodone-acetaminophen (NORCO/VICODIN) 5-325 MG tablet   Oral   Take 2 tablets by mouth every 6  (six) hours as needed for moderate pain.   20 tablet   0   . lisinopril (PRINIVIL,ZESTRIL) 5 MG tablet   Oral   Take 5 mg by mouth daily.         . pravastatin (PRAVACHOL) 40 MG tablet   Oral   Take 40 mg by mouth daily.         . propranolol (INDERAL) 40 MG tablet   Oral   Take 40 mg by mouth 2 (two) times daily.            Allergies Levaquin and Penicillins  History reviewed. No pertinent family history.  Social History Social History  Substance Use Topics  . Smoking status: Former Smoker    Quit date: 09/04/2004  . Smokeless tobacco: None  . Alcohol Use: No    Review of Systems Constitutional: No fever/chills Eyes: No visual changes. ENT: No sore throat. Cardiovascular: Denies chest pain. Respiratory: Denies shortness of breath. Gastrointestinal: No abdominal pain.  No nausea, no vomiting.  No diarrhea.  No constipation. Genitourinary: Negative for dysuria. Musculoskeletal: Negative for back pain. Skin: Negative for rash. Neurological:  Negative for headaches, focal weakness or numbness.  10-point ROS otherwise negative.  ____________________________________________   PHYSICAL EXAM:  VITAL SIGNS: ED Triage Vitals  Enc Vitals Group     BP 05/03/15 1440 142/119 mmHg     Pulse Rate 05/03/15 1440 114     Resp 05/03/15 1440 24     Temp 05/03/15 1440 97.7 F (36.5 C)     Temp Source 05/03/15 1440 Oral     SpO2 05/03/15 1440 99 %     Weight 05/03/15 1440 160 lb (72.576 kg)     Height 05/03/15 1440 5\' 9"  (1.753 m)     Head Cir --      Peak Flow --      Pain Score 05/03/15 1440 10     Pain Loc --      Pain Edu? --      Excl. in [place]? --    Constitutional: Alert and oriented. Patient with essential tremor which is easily evident patient also in discomfort. Eyes: Conjunctivae are normal. PERRL. EOMI. Head: Atraumatic. Nose: No congestion/rhinnorhea. Mouth/Throat: Mucous membranes are moist.  Oropharynx non-erythematous. Neck: No  stridor. Cardiovascular: Normal rate, regular rhythm. Grossly normal heart sounds.  Good peripheral circulation. Respiratory: Normal respiratory effort.  No retractions. Lungs CTAB. Gastrointestinal: Soft and nontender. No distention. No abdominal bruits. No CVA tenderness. Genitourinary: Normal circumcised male. Left testicle is tender to touch feels perhaps slightly larger than the right. Exam is difficult because of the pain. Nose no discharge. Cremasteric reflex is evident on either side. Musculoskeletal: No lower extremity tenderness nor edema.  No joint effusions. Neurologic:  Normal speech and language. No gross focal neurologic deficits are appreciated. No gait instability. Skin:  Skin is warm, dry and intact. No rash noted. Psychiatric: Mood and affect are normal. Speech and behavior are normal.  ____________________________________________   LABS (all labs ordered are listed, but only abnormal results are displayed)  Labs Reviewed  URINALYSIS COMPLETEWITH MICROSCOPIC ([place]) - Abnormal; Notable for the following:    Color, Urine YELLOW (*)    APPearance CLEAR (*)    Hgb urine dipstick 3+ (*)    All other components within normal limits  CBC WITH DIFFERENTIAL/PLATELET - Abnormal; Notable for the following:    WBC 11.1 (*)    Neutro Abs 8.3 (*)    Basophils Absolute 0.3 (*)    All other components within normal limits  HEPATIC FUNCTION PANEL  BASIC METABOLIC PANEL   ____________________________________________  EKG   ____________________________________________  RADIOLOGY  CT read by radiology shows a stone entering the bladder. ____________________________________________   PROCEDURES  Patient's pain has resolved I will discharge him with urology follow-up  ____________________________________________   INITIAL IMPRESSION / ASSESSMENT AND PLAN / ED COURSE  Pertinent labs & imaging results that were available during my care of the patient were reviewed  by me and considered in my medical decision making (see chart for details).   ____________________________________________   FINAL CLINICAL IMPRESSION(S) / ED DIAGNOSES  Final diagnoses:  Pain  Renal colic      Nena Polio, MD 05/03/15 2341

## 2015-05-03 NOTE — ED Notes (Signed)
Pt states "I have a pain in my penis". Pt states 1st episode of pain started this morning when he tried to urinate. Pt states now that he feels like he needs to go but knows that he doesn't need too. Pt states "I need to pee when I can't pee, and when I do pee ain't a whole lot". Pt denies known injury to penis at this time. Pt presents obviously uncomfortable at this time.

## 2015-05-03 NOTE — ED Notes (Signed)
Bladder scan performed 66mL.

## 2015-05-03 NOTE — ED Notes (Signed)
Patient transported to Ultrasound 

## 2015-06-12 ENCOUNTER — Ambulatory Visit: Payer: PPO | Admitting: Neurology

## 2015-06-12 DIAGNOSIS — Z029 Encounter for administrative examinations, unspecified: Secondary | ICD-10-CM

## 2015-08-14 ENCOUNTER — Encounter: Payer: Self-pay | Admitting: Primary Care

## 2015-08-14 ENCOUNTER — Ambulatory Visit (INDEPENDENT_AMBULATORY_CARE_PROVIDER_SITE_OTHER): Payer: PPO | Admitting: Primary Care

## 2015-08-14 VITALS — BP 156/94 | HR 94 | Temp 97.6°F | Ht 69.0 in | Wt 158.4 lb

## 2015-08-14 DIAGNOSIS — L989 Disorder of the skin and subcutaneous tissue, unspecified: Secondary | ICD-10-CM | POA: Diagnosis not present

## 2015-08-14 DIAGNOSIS — G250 Essential tremor: Secondary | ICD-10-CM | POA: Insufficient documentation

## 2015-08-14 DIAGNOSIS — R0602 Shortness of breath: Secondary | ICD-10-CM | POA: Insufficient documentation

## 2015-08-14 DIAGNOSIS — I10 Essential (primary) hypertension: Secondary | ICD-10-CM | POA: Insufficient documentation

## 2015-08-14 DIAGNOSIS — E785 Hyperlipidemia, unspecified: Secondary | ICD-10-CM | POA: Diagnosis not present

## 2015-08-14 MED ORDER — ALBUTEROL SULFATE HFA 108 (90 BASE) MCG/ACT IN AERS: 2.0000 | INHALATION_SPRAY | Freq: Four times a day (QID) | RESPIRATORY_TRACT | Status: DC | PRN

## 2015-08-14 MED ORDER — MUPIROCIN CALCIUM 2 % EX CREA: 1.0000 "application " | TOPICAL_CREAM | Freq: Two times a day (BID) | CUTANEOUS | Status: DC

## 2015-08-14 NOTE — Assessment & Plan Note (Signed)
Present for years, once following with neurology who ruled out Parkinson's disease.  Moderate to severe left hand resting tremor. Has not had meds in 1 year as he refuses.  Will continue to monitor.

## 2015-08-14 NOTE — Assessment & Plan Note (Addendum)
With exertion and regular ADL's. Suspect this is emphysema related as evidenced on CT and chest xray in 2016. Rx for albuterol sent to pharmacy. Patient to schedule spirometry next week. Will likely require daily inhaler.  Exam without evidence of acute bacterial process.

## 2015-08-14 NOTE — Assessment & Plan Note (Signed)
Managed on pravastatin but has not taken in 1 year. Will repeat lipids at upcoming physical this summer.

## 2015-08-14 NOTE — Progress Notes (Signed)
Subjective:    Patient ID: [name], male    DOB: 1940-08-21, 75 y.o.   MRN: [id number]  HPI  Mr. [name] is a 75 year old male who presents today to establish care and discuss the problems mentioned below. Will obtain old records. He stopped taking all of his medications 1 year ago as he didn't want to continue taking.  1) Skin Mass: Located to left ear and has been present for the past 2-3 months. He reports itching, burning, and stinging to the left ear. He's tried hydrocortisone cream OTC which caused burning. He reports a gradual increase in size, but he's been picking at the site. He has numerous other lesions/masses to his skin. He's not undergone evaluation with dermatologist. Denies internal ear pain, fevers.   2) Essential Hypertension: Once managed on Lisinopril 5 mg and propranolol 40 mg, but has not taken in over 1 year. His BP is above goal in the clinic today. He stopped taking his medication because he didn't feel it worked. He denies chest pain, does have exertional shortness of breath with ADL's.   3) Hyperlipidemia: Once managed on Pravastatin 40 mg, but has not taken in over 1 year.   4) Essential Tremor: Once followed with Neurology who ruled out parkinson's disease. He was once managed on benztropine 1 mg and carbidopa-levadopa 25-100 mg but has not taken in over 1 year as he doesn't believe it helped. He has an active resting tremor to bilateral hands, left worse than right.  5) Shortness of Breath: Prior smoker since age 34, quit 15 years ago. Increased shortness of breath with normal activities, does not have resting shortness of breath. CT and chest xray from 2016 with emphysema. He's currently not managed on inhalers. Denies cough, fevers, chills.   Review of Systems  Respiratory:       Occasional shortness of breath  Cardiovascular: Negative for chest pain.  Musculoskeletal: Positive for arthralgias.  Skin:       See HPI  Neurological: Positive for  tremors. Negative for dizziness and headaches.  Psychiatric/Behavioral:       History of depression, once managed on Cymbalta, does not wish to start meds.       Past Medical History  Diagnosis Date  . Depression   . Essential tremor   . Vitamin D deficiency   . Seizure disorder ([place])   . Hypertension   . Hyperlipidemia   . Diabetes ([place])   . Heart disease      Social History   Social History  . Marital Status: Married    Spouse Name: N/A  . Number of Children: N/A  . Years of Education: N/A   Occupational History  . Not on file.   Social History Main Topics  . Smoking status: Former Smoker    Quit date: 09/04/2004  . Smokeless tobacco: Not on file  . Alcohol Use: No  . Drug Use: No  . Sexual Activity: Not on file   Other Topics Concern  . Not on file   Social History Narrative   Married.   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   Enjoys spending time with family.     Past Surgical History  Procedure Laterality Date  . Inguinal hernia repair Left as child    Family History  Problem Relation Age of Onset  . Lung cancer Father     Allergies  Allergen Reactions  . Levaquin [Levofloxacin In D5w] Nausea And Vomiting  . Penicillins Hives  No current outpatient prescriptions on file prior to visit.   No current facility-administered medications on file prior to visit.    BP 156/94 mmHg  Pulse 94  Temp(Src) 97.6 F (36.4 C) (Oral)  Ht 5\' 9"  (1.753 m)  Wt 158 lb 6.4 oz (71.85 kg)  BMI 23.38 kg/m2  SpO2 98%    Objective:   Physical Exam  Constitutional: He is oriented to person, place, and time.  HENT:  Right Ear: Tympanic membrane and ear canal normal.  Left Ear: Tympanic membrane and ear canal normal.  Mouth/Throat: Oropharynx is clear and moist.  Neck: Neck supple.  Cardiovascular: Normal rate and regular rhythm.   Pulmonary/Chest: Effort normal. He has rales.  Neurological: He is alert and oriented to person, place, and time.    Resting tremor bilaterally, worse to left side.  Skin:  Multiple suspicious lesions to skin, especially to left and right ear and left deltoid.   Psychiatric: He has a normal mood and affect.          Assessment & Plan:

## 2015-08-14 NOTE — Assessment & Plan Note (Signed)
Numerous suspicious lesions to face and extremities, especially to left ear and left deltoid. Will treat with Mupirocin cream BID. Referral to dermatology placed for further evaluation.  Former smoker, outdoors a lot and does not wear sunscreen.

## 2015-08-14 NOTE — Patient Instructions (Signed)
The sore on your ear is suspicious. Apply Mupirocin cream twice daily to left ear.   Stop by the front desk and speak with either Rosaria Ferries or Ebony Hail regarding your referral to Dermatology.  Use the albuterol inhaler every 6 hours as needed for wheezing and shortness of breath. Please notify me if you use this inhaler more than 3 times weekly.  Schedule a nurse only visit for spirometry which will evaluate your lungs. You may require a daily inhaler.  It was a pleasure to meet you today! Please don't hesitate to call me with any questions. Welcome to [facility]!

## 2015-08-14 NOTE — Progress Notes (Signed)
Pre visit review using our clinic review tool, if applicable. No additional management support is needed unless otherwise documented below in the visit note. 

## 2015-08-14 NOTE — Assessment & Plan Note (Signed)
History of, once managed on lisinopril 5 mg for which he stopped taking 1 year ago. BP above goal in clinic today, he will monitor home BP readings for 2 weeks. Will call for readings in 2 weeks.

## 2015-08-21 ENCOUNTER — Ambulatory Visit (INDEPENDENT_AMBULATORY_CARE_PROVIDER_SITE_OTHER): Payer: PPO | Admitting: *Deleted

## 2015-08-21 DIAGNOSIS — R0602 Shortness of breath: Secondary | ICD-10-CM

## 2015-08-26 ENCOUNTER — Other Ambulatory Visit: Payer: Self-pay | Admitting: Primary Care

## 2015-08-26 DIAGNOSIS — J449 Chronic obstructive pulmonary disease, unspecified: Secondary | ICD-10-CM

## 2015-08-26 MED ORDER — FLUTICASONE-SALMETEROL 250-50 MCG/DOSE IN AEPB: 1.0000 | INHALATION_SPRAY | Freq: Two times a day (BID) | RESPIRATORY_TRACT | Status: DC

## 2015-08-27 ENCOUNTER — Encounter: Payer: Self-pay | Admitting: Primary Care

## 2015-12-14 ENCOUNTER — Encounter (HOSPITAL_COMMUNITY): Payer: Self-pay | Admitting: *Deleted

## 2015-12-14 ENCOUNTER — Other Ambulatory Visit: Payer: Self-pay

## 2015-12-14 ENCOUNTER — Emergency Department (HOSPITAL_COMMUNITY): Payer: PPO

## 2015-12-14 ENCOUNTER — Observation Stay (HOSPITAL_COMMUNITY)
Admission: EM | Admit: 2015-12-14 | Discharge: 2015-12-16 | Disposition: A | Discharge status: Home / Self Care | Payer: PPO | Attending: Internal Medicine | Admitting: Internal Medicine

## 2015-12-14 DIAGNOSIS — E119 Type 2 diabetes mellitus without complications: Secondary | ICD-10-CM | POA: Diagnosis not present

## 2015-12-14 DIAGNOSIS — G9349 Other encephalopathy: Secondary | ICD-10-CM | POA: Diagnosis present

## 2015-12-14 DIAGNOSIS — R0602 Shortness of breath: Secondary | ICD-10-CM | POA: Diagnosis not present

## 2015-12-14 DIAGNOSIS — R079 Chest pain, unspecified: Secondary | ICD-10-CM | POA: Diagnosis not present

## 2015-12-14 DIAGNOSIS — R05 Cough: Secondary | ICD-10-CM | POA: Diagnosis not present

## 2015-12-14 DIAGNOSIS — I519 Heart disease, unspecified: Secondary | ICD-10-CM | POA: Diagnosis present

## 2015-12-14 DIAGNOSIS — G40909 Epilepsy, unspecified, not intractable, without status epilepticus: Secondary | ICD-10-CM | POA: Insufficient documentation

## 2015-12-14 DIAGNOSIS — E785 Hyperlipidemia, unspecified: Secondary | ICD-10-CM | POA: Diagnosis present

## 2015-12-14 DIAGNOSIS — Z87891 Personal history of nicotine dependence: Secondary | ICD-10-CM | POA: Diagnosis not present

## 2015-12-14 DIAGNOSIS — F32A Depression, unspecified: Secondary | ICD-10-CM

## 2015-12-14 DIAGNOSIS — R1310 Dysphagia, unspecified: Secondary | ICD-10-CM

## 2015-12-14 DIAGNOSIS — L989 Disorder of the skin and subcutaneous tissue, unspecified: Secondary | ICD-10-CM

## 2015-12-14 DIAGNOSIS — F329 Major depressive disorder, single episode, unspecified: Secondary | ICD-10-CM

## 2015-12-14 DIAGNOSIS — R569 Unspecified convulsions: Secondary | ICD-10-CM

## 2015-12-14 DIAGNOSIS — R0789 Other chest pain: Principal | ICD-10-CM | POA: Insufficient documentation

## 2015-12-14 DIAGNOSIS — I10 Essential (primary) hypertension: Secondary | ICD-10-CM | POA: Diagnosis not present

## 2015-12-14 DIAGNOSIS — G250 Essential tremor: Secondary | ICD-10-CM | POA: Diagnosis present

## 2015-12-14 DIAGNOSIS — G2401 Drug induced subacute dyskinesia: Secondary | ICD-10-CM

## 2015-12-14 DIAGNOSIS — J449 Chronic obstructive pulmonary disease, unspecified: Secondary | ICD-10-CM | POA: Diagnosis not present

## 2015-12-14 HISTORY — DX: Dysphagia, unspecified: R13.10

## 2015-12-14 HISTORY — DX: Chest pain, unspecified: R07.9

## 2015-12-14 HISTORY — DX: Drug induced subacute dyskinesia: G24.01

## 2015-12-14 LAB — BASIC METABOLIC PANEL
Anion gap: 7 (ref 5–15)
BUN: 11 mg/dL (ref 6–20)
CALCIUM: 8.7 mg/dL — AB (ref 8.9–10.3)
CO2: 27 mmol/L (ref 22–32)
CREATININE: 0.76 mg/dL (ref 0.61–1.24)
Chloride: 102 mmol/L (ref 101–111)
GFR calc non Af Amer: >60 mL/min (ref >60)
Glucose, Bld: 117 mg/dL — ABNORMAL HIGH (ref 65–99)
Potassium: 3.7 mmol/L (ref 3.5–5.1)
SODIUM: 136 mmol/L (ref 135–145)

## 2015-12-14 LAB — LIPID PANEL
CHOL/HDL RATIO: 6.2 ratio
Cholesterol: 197 mg/dL (ref 0–200)
HDL: 32 mg/dL — ABNORMAL LOW (ref >40)
LDL Cholesterol: 130 mg/dL — ABNORMAL HIGH (ref 0–99)
Triglycerides: 174 mg/dL — ABNORMAL HIGH (ref <150)
VLDL: 35 mg/dL (ref 0–40)

## 2015-12-14 LAB — CBC WITH DIFFERENTIAL/PLATELET
BASOS PCT: 0 %
Basophils Absolute: 0 10*3/uL (ref 0.0–0.1)
EOS ABS: 0.1 10*3/uL (ref 0.0–0.7)
EOS PCT: 1 %
HCT: 47 % (ref 39.0–52.0)
Hemoglobin: 15.8 g/dL (ref 13.0–17.0)
Lymphocytes Relative: 18 %
Lymphs Abs: 1.6 10*3/uL (ref 0.7–4.0)
MCH: 28.6 pg (ref 26.0–34.0)
MCHC: 33.6 g/dL (ref 30.0–36.0)
MCV: 85.1 fL (ref 78.0–100.0)
MONO ABS: 0.9 10*3/uL (ref 0.1–1.0)
MONOS PCT: 10 %
Neutro Abs: 6.1 10*3/uL (ref 1.7–7.7)
Neutrophils Relative %: 71 %
Platelets: 219 10*3/uL (ref 150–400)
RBC: 5.52 MIL/uL (ref 4.22–5.81)
RDW: 13.2 % (ref 11.5–15.5)
WBC: 8.7 10*3/uL (ref 4.0–10.5)

## 2015-12-14 LAB — URINALYSIS, ROUTINE W REFLEX MICROSCOPIC
Glucose, UA: 250 mg/dL — AB
Hgb urine dipstick: NEGATIVE
Ketones, ur: NEGATIVE mg/dL
LEUKOCYTES UA: NEGATIVE
NITRITE: NEGATIVE
Protein, ur: NEGATIVE mg/dL
SPECIFIC GRAVITY, URINE: 1.031 — AB (ref 1.005–1.030)
pH: 5.5 (ref 5.0–8.0)

## 2015-12-14 LAB — TSH: TSH: 0.843 u[IU]/mL (ref 0.350–4.500)

## 2015-12-14 LAB — I-STAT TROPONIN, ED: TROPONIN I, POC: 0.05 ng/mL (ref 0.00–0.08)

## 2015-12-14 LAB — CREATININE, SERUM
Creatinine, Ser: 0.8 mg/dL (ref 0.61–1.24)
GFR calc Af Amer: >60 mL/min (ref >60)
GFR calc non Af Amer: >60 mL/min (ref >60)

## 2015-12-14 LAB — VITAMIN B12: VITAMIN B 12: 118 pg/mL — AB (ref 180–914)

## 2015-12-14 LAB — TROPONIN I
TROPONIN I: 0.15 ng/mL — AB (ref <0.03)
TROPONIN I: 0.27 ng/mL — AB (ref <0.03)

## 2015-12-14 LAB — BRAIN NATRIURETIC PEPTIDE: B NATRIURETIC PEPTIDE 5: 15.6 pg/mL (ref 0.0–100.0)

## 2015-12-14 LAB — D-DIMER, QUANTITATIVE: D-Dimer, Quant: 0.34 ug/mL-FEU (ref 0.00–0.50)

## 2015-12-14 MED ORDER — GI COCKTAIL ~~LOC~~: 30.0000 mL | Freq: Four times a day (QID) | ORAL | Status: DC | PRN

## 2015-12-14 MED ORDER — MOMETASONE FURO-FORMOTEROL FUM 200-5 MCG/ACT IN AERO
2.0000 | INHALATION_SPRAY | Freq: Two times a day (BID) | RESPIRATORY_TRACT | Status: DC
  Administered 2015-12-14 – 2015-12-16 (×4): 2 via RESPIRATORY_TRACT
  Filled 2015-12-14: qty 8.8

## 2015-12-14 MED ORDER — ASPIRIN EC 81 MG PO TBEC
81.0000 mg | DELAYED_RELEASE_TABLET | Freq: Every day | ORAL | Status: DC
  Administered 2015-12-15 – 2015-12-16 (×2): 81 mg via ORAL
  Filled 2015-12-14 (×2): qty 1

## 2015-12-14 MED ORDER — NITROGLYCERIN 0.4 MG SL SUBL
0.4000 mg | SUBLINGUAL_TABLET | SUBLINGUAL | Status: DC | PRN
  Administered 2015-12-15: 0.4 mg via SUBLINGUAL
  Filled 2015-12-14: qty 1

## 2015-12-14 MED ORDER — ALPRAZOLAM 0.25 MG PO TABS
0.2500 mg | ORAL_TABLET | Freq: Two times a day (BID) | ORAL | Status: DC | PRN
  Administered 2015-12-14: 0.25 mg via ORAL
  Filled 2015-12-14: qty 1

## 2015-12-14 MED ORDER — ACETAMINOPHEN 325 MG PO TABS
650.0000 mg | ORAL_TABLET | ORAL | Status: DC | PRN
  Administered 2015-12-14: 650 mg via ORAL
  Filled 2015-12-14: qty 2

## 2015-12-14 MED ORDER — ALBUTEROL SULFATE (2.5 MG/3ML) 0.083% IN NEBU
2.5000 mg | INHALATION_SOLUTION | Freq: Four times a day (QID) | RESPIRATORY_TRACT | Status: DC
  Administered 2015-12-14: 2.5 mg via RESPIRATORY_TRACT
  Filled 2015-12-14: qty 3

## 2015-12-14 MED ORDER — MORPHINE SULFATE (PF) 2 MG/ML IV SOLN: 2.0000 mg | INTRAVENOUS | Status: DC | PRN

## 2015-12-14 MED ORDER — CARBIDOPA-LEVODOPA ER 25-100 MG PO TBCR
1.0000 | EXTENDED_RELEASE_TABLET | Freq: Two times a day (BID) | ORAL | Status: DC
  Administered 2015-12-14 – 2015-12-16 (×3): 1 via ORAL
  Filled 2015-12-14 (×6): qty 1

## 2015-12-14 MED ORDER — ALBUTEROL SULFATE (2.5 MG/3ML) 0.083% IN NEBU
2.5000 mg | INHALATION_SOLUTION | Freq: Three times a day (TID) | RESPIRATORY_TRACT | Status: DC
  Administered 2015-12-14 – 2015-12-15 (×2): 2.5 mg via RESPIRATORY_TRACT
  Filled 2015-12-14 (×2): qty 3

## 2015-12-14 MED ORDER — HEPARIN SODIUM (PORCINE) 5000 UNIT/ML IJ SOLN
5000.0000 [IU] | Freq: Three times a day (TID) | INTRAMUSCULAR | Status: DC
  Administered 2015-12-14 – 2015-12-16 (×5): 5000 [IU] via SUBCUTANEOUS
  Filled 2015-12-14 (×5): qty 1

## 2015-12-14 MED ORDER — METOPROLOL TARTRATE 12.5 MG HALF TABLET
12.5000 mg | ORAL_TABLET | Freq: Two times a day (BID) | ORAL | Status: DC
  Administered 2015-12-14 – 2015-12-16 (×5): 12.5 mg via ORAL
  Filled 2015-12-14 (×5): qty 1

## 2015-12-14 MED ORDER — ONDANSETRON HCL 4 MG/2ML IJ SOLN: 4.0000 mg | Freq: Four times a day (QID) | INTRAMUSCULAR | Status: DC | PRN

## 2015-12-14 MED ORDER — GI COCKTAIL ~~LOC~~
30.0000 mL | Freq: Once | ORAL | Status: AC
  Administered 2015-12-14: 30 mL via ORAL
  Filled 2015-12-14: qty 30

## 2015-12-14 NOTE — ED Triage Notes (Signed)
Pt reports he has had Lt sided CP for 2 days . The CP was worse today. Pt transported by EMS to ED. Initial BP 244/100 and decreased to 144/82 following 2 NGT tabs and 324 ASA.  Initial HR was 113 to 104 per EMS. PT A/O on arrival .

## 2015-12-14 NOTE — Progress Notes (Signed)
Troponin of 0.15 called to me (critical lab). I notified Dyanne Carrel and no new orders. Pt did have an episode of chest pain while working with PT but it subsided once therapy was stopped per Manuela Schwartz, PT. By the time I came to assess him, pt only complaining of a headache. Tylenol given. Will closely monitor

## 2015-12-14 NOTE — H&P (Signed)
History and Physical    [name] [id number] DOB: 1940-05-18 DOA: 12/14/2015  PCP: Sheral Flow, NP Patient coming from: home  Chief Complaint: chest pain  HPI: [name] is a pleasant but anxious 75 y.o. male with medical history significant for hypertension, hyperlipidemia, COPD since to the emergency Department chief complaint chest pain. Patient being admitted rule out.  Information is obtained from the patient and his wife who is at the bedside. Patient reports he's been "confused" for over a year. Wife reports this is no worse than usual. She states he's been coughing for the last 3-4 days. He reports the cough is nonproductive This morning during a coughing spell he developed left anterior chest pain that radiated to his left arm. Associated symptoms include lightheadedness but no syncope or near-syncope. Denies palpitation lower extremity edema or orthopnea. Denies worsening shortness of breath. Of note he has been prescribed an inhaler but doesn't use it. He denies abdominal pain nausea vomiting dysuria hematuria frequency or urgency. He denies diarrhea constipation melena bright red blood per rectum. EMS gave him nitroglycerin 2 and aspirin. The point of admission he is pain-free    ED Course: In emergency department he's afebrile hemodynamically stable and not hypoxic. Initially he was tachycardic but this resolved quickly  Review of Systems: As per HPI otherwise 10 point review of systems negative.   Ambulatory Status: He ambulates with a cane and denies any recent falls  Past Medical History:  Diagnosis Date  . Chest pain   . COPD (chronic obstructive pulmonary disease) ([place])   . Depression   . Diabetes ([place])   . Dysphagia   . Essential tremor   . Heart disease   . Hyperlipidemia   . Hypertension   . Seizure disorder ([place])   . Tardive dyskinesia   . Vitamin D deficiency     Past Surgical History:  Procedure Laterality Date  .  INGUINAL HERNIA REPAIR Left as child    Social History   Social History  . Marital status: Married    Spouse name: N/A  . Number of children: N/A  . Years of education: N/A   Occupational History  . Not on file.   Social History Main Topics  . Smoking status: Former Smoker    Quit date: 09/04/2004  . Smokeless tobacco: Never Used  . Alcohol use No  . Drug use: No  . Sexual activity: Not on file   Other Topics Concern  . Not on file   Social History Narrative   Married.   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   Enjoys spending time with family.     Allergies  Allergen Reactions  . Levaquin [Levofloxacin In D5w] Nausea And Vomiting  . Penicillins Hives    Family History  Problem Relation Age of Onset  . Lung cancer Father     Prior to Admission medications   Medication Sig Start Date End Date Taking? Authorizing Provider  albuterol (PROVENTIL HFA;VENTOLIN HFA) 108 (90 Base) MCG/ACT inhaler Inhale 2 puffs into the lungs every 6 (six) hours as needed for wheezing or shortness of breath. Patient not taking: Reported on 12/14/2015 08/14/15   Pleas Koch, NP  Fluticasone-Salmeterol (ADVAIR DISKUS) 250-50 MCG/DOSE AEPB Inhale 1 puff into the lungs every 12 (twelve) hours. Patient not taking: Reported on 12/14/2015 08/26/15   Pleas Koch, NP  mupirocin cream (BACTROBAN) 2 % Apply 1 application topically 2 (two) times daily. Patient not taking: Reported on 12/14/2015 08/14/15  Pleas Koch, NP    Physical Exam: Vitals:   12/14/15 1230 12/14/15 1245 12/14/15 1300 12/14/15 1315  BP: 132/82 153/85 131/81 132/84  Pulse: 85 77 86 82  Resp: (!) 33 26 (!) 28 26  Temp:      TempSrc:      SpO2: 96% 98% 96% 96%     General:  Appears Anxious, thin, frail but no acute distress, chronically ill appearing Eyes:  PERRL, EOMI, normal lids, iris ENT:  grossly normal hearing, lips & tongue, mucous membranes of his mouth are moist and pink Neck:  no LAD, masses or  thyromegaly Cardiovascular:  RRR, no m/r/g. No LE edema.  Respiratory:  Breath sounds somewhat distant but clear no wheezing uses accessory muscles Abdomen:  soft, ntnd, positive bowel sounds no guarding or rebounding Skin:  Right ear with wound that he picks at. Left ear with similar wound, left side of face  With same. Multiple suspicious lesions. No swelling drainage Musculoskeletal:  grossly normal tone BUE/BLE, good ROM, no bony abnormality Psychiatric:  grossly normal mood and affect, speech fluent and appropriate, AOx3 Neurologic:  Alert oriented to self only follows commands tremor UE the lateral grip 5 out of 5  Labs on Admission: I have personally reviewed following labs and imaging studies  CBC:  Recent Labs Lab 12/14/15 1105  WBC 8.7  NEUTROABS 6.1  HGB 15.8  HCT 47.0  MCV 85.1  PLT [id number]   Basic Metabolic Panel:  Recent Labs Lab 12/14/15 1105  NA 136  K 3.7  CL 102  CO2 27  GLUCOSE 117*  BUN 11  CREATININE 0.76  CALCIUM 8.7*   GFR: CrCl cannot be calculated (Unknown ideal weight.). Liver Function Tests: No results for input(s): AST, ALT, ALKPHOS, BILITOT, PROT, ALBUMIN in the last 168 hours. No results for input(s): LIPASE, AMYLASE in the last 168 hours. No results for input(s): AMMONIA in the last 168 hours. Coagulation Profile: No results for input(s): INR, PROTIME in the last 168 hours. Cardiac Enzymes: No results for input(s): CKTOTAL, CKMB, CKMBINDEX, TROPONINI in the last 168 hours. BNP (last 3 results) No results for input(s): PROBNP in the last 8760 hours. HbA1C: No results for input(s): HGBA1C in the last 72 hours. CBG: No results for input(s): GLUCAP in the last 168 hours. Lipid Profile: No results for input(s): CHOL, HDL, LDLCALC, TRIG, CHOLHDL, LDLDIRECT in the last 72 hours. Thyroid Function Tests: No results for input(s): TSH, T4TOTAL, FREET4, T3FREE, THYROIDAB in the last 72 hours. Anemia Panel: No results for input(s): VITAMINB12,  FOLATE, FERRITIN, TIBC, IRON, RETICCTPCT in the last 72 hours. Urine analysis:    Component Value Date/Time   COLORURINE YELLOW (A) 05/03/2015 1753   APPEARANCEUR CLEAR (A) 05/03/2015 1753   APPEARANCEUR Clear 05/13/2014 1251   LABSPEC 1.013 05/03/2015 1753   LABSPEC 1.013 05/13/2014 1251   PHURINE 5.0 05/03/2015 1753   GLUCOSEU NEGATIVE 05/03/2015 1753   GLUCOSEU Negative 05/13/2014 1251   HGBUR 3+ (A) 05/03/2015 1753   BILIRUBINUR NEGATIVE 05/03/2015 1753   BILIRUBINUR Negative 05/13/2014 1251   KETONESUR NEGATIVE 05/03/2015 1753   PROTEINUR NEGATIVE 05/03/2015 1753   NITRITE NEGATIVE 05/03/2015 1753   LEUKOCYTESUR NEGATIVE 05/03/2015 1753   LEUKOCYTESUR Negative 05/13/2014 1251    Creatinine Clearance: CrCl cannot be calculated (Unknown ideal weight.).  Sepsis Labs: @LABRCNTIP (procalcitonin:4,lacticidven:4) )No results found for this or any previous visit (from the past 240 hour(s)).   Radiological Exams on Admission: Dg Chest Port 1 View  Result Date: 12/14/2015  CLINICAL DATA:  Left-sided chest pain. Nonproductive cough, and shortness of breath for several weeks. EXAM: PORTABLE CHEST 1 VIEW COMPARISON:  02/17/2015 FINDINGS: The heart size and mediastinal contours are within normal limits. Both lungs are clear. No evidence of pneumothorax or pleural effusion. The visualized skeletal structures are unremarkable. IMPRESSION: No active disease. Electronically Signed   By: Earle Gell M.D.   On: 12/14/2015 11:29    EKG: Independently reviewed. Sinus tachycardia Left axis deviation Repol abnrm suggests ischemia, diffuse leads  Assessment/Plan Principal Problem:   Chest pain Active Problems:   Skin lesion   Essential hypertension   Hyperlipidemia   Essential tremor   COPD (chronic obstructive pulmonary disease) ([facility])   Heart disease   Encephalopathy chronic   Dysphagia   #1. Chest pain. Some typical and atypical features. Heart score is 6. Initial troponin is negative.  EKG as noted above. Chest x-ray with no active disease. Rest factors include hypertension, hyperlipidemia, CAD. Patient reports never having had a stress test. Pain-free on admission -Admit to telemetry -Obtain a lipid panel -Cycle troponin -Serial EKG -Aspirin -Consider a statin -May benefit from outpatient stress test  #2. Tachycardia on presentation. Resolved on admission. EKG as noted above. -See #1 -Obtain a TSH -metoprolol -monitor  #3. COPD. Appears stable at baseline. Respiratory effort somewhat increased but wife reports this is his baseline. Chest x-ray as noted above. He is not hypoxic. Reports he was on inhaler in the past but stopped taking this. Chart review indicates former smoker with shortness of breath at rest. CT and chest x-ray from 2016 with emphysema. D-dimer negative BNP within the limits of normal -Supplemental oxygen as indicated -Wean as able -Albuterol inhaler -Advair -Scheduled meds  #4. Hypertension. Fair control. Chart review indicates his wants managed with 5 mg of lisinopril and per Florene Glen off 40 mg. He states he stopped taking this over a year ago as "it was not working". He saw his PCP in May of this year and note indicates blood pressure above goal at that time -will start low dose metoprolol with parameters -monitor  5. Hyperlipidemia. Chart review indicates patient once managed with pravastatin  -Obtain a lipid panel -pravastatin  6. Essential tremor. Chart review indicates neurology ruled out Parkinson's.  Once managed with benztropine  And carbidopa-levadopa is not taken in over a year. He states he did not think helped -carbidopa -Physical therapy consult  #7. Depression/anxiety. Chart review indicates patient once on Cymbalta. He is clearly anxious in the emergency department. He does not wish to resume Cymbalta -Xanax low dose twice a day as needed  8. Dementia. Patient reports "I don't know where I am". Wife reports this as being on on  for over a year. No signs of infectious process. No metabolic derangement. He hasn't been taking any medications. Reports oral intake at baseline no unintentional weight loss. Wife reports currently at baseline -We'll check B-12 folate level -We will check RPR -If no improvement consider -outpatient follow up with neuro  #9. History of seizure. Currently not on any medications. Chart review indicates evaluated by neurology 2. His EEG an ambulatory EEG were negative. Denies any seizure activity in the last year  #10. Dysphasia. Chart review indicates he had a barium swallow in 2016 that demonstrated a moderate oral phase and mild cervical phase dysphasia. Dysphasia 3 mechanical soft diet with thin liquids was recommended. Wife reports he is not following this    DVT prophylaxis: scd Code Status: dnr  Family Communication: wife  at bedside  Disposition Plan: home may benefit placement  Consults called: none  Admission status: obs    Radene Gunning MD [facility]  If 7PM-7AM, please contact night-coverage [email or web address] Password [email or web address]  12/14/2015, 1:53 PM

## 2015-12-14 NOTE — Evaluation (Signed)
Physical Therapy Evaluation Patient Details Name: [name] MRN: [id number] DOB: 12/04/1940 Today's Date: 12/14/2015   History of Present Illness  Patient is a 75 yo male admitted 12/14/15 with chest pain and confusion.    PMH:  Anxiety, depression, HTN, HLD, COPD, Tremors (Parkinson's ruled out), seizure, chronic encephalopathy, dysphagia    Clinical Impression  Patient presents with problems listed below.  Will benefit from acute PT to maximize functional mobility prior to discharge.  Session limited today due to increase in HR and chest pain.  Patient reports he is going home at d/c.  Patient will need to function at supervision level with bed mobility and gait to return home with wife.  If unable to achieve this level, may need to consider SNF.    Follow Up Recommendations Home health PT;Supervision for mobility/OOB    Equipment Recommendations  None recommended by PT    Recommendations for Other Services       Precautions / Restrictions Precautions Precautions: Fall Precaution Comments: Constant tremors Restrictions Weight Bearing Restrictions: No      Mobility  Bed Mobility Overal bed mobility: Needs Assistance Bed Mobility: Rolling;Sidelying to Sit;Sit to Supine Rolling: Min guard Sidelying to sit: Min assist   Sit to supine: Min assist   General bed mobility comments: Assist to raise trunk to sitting position.  Patient able to sit EOB x 10 minutes with min guard assist.  Patient with kyphotic posture with head forward and down.  Unable to fully extend neck.  HR ranging 96 - 107 in sitting.  Returned to supine with min assist to control trunk.  In supine, patient immediately c/o back pain from position change.  After approximately 20 seconds, patient then c/o chest pain at 3/10.  RN notified.  Transfers                 General transfer comment: NT  Ambulation/Gait                Stairs            Wheelchair Mobility    Modified  [place] (Stroke Patients Only)       Balance Overall balance assessment: Needs assistance Sitting-balance support: No upper extremity supported;Feet supported Sitting balance-Leahy Scale: Fair                                       Pertinent Vitals/Pain Pain Assessment: 0-10 Pain Score: 3  (3 - chest pain; 8 - back pain) Pain Location: chest pain and back pain at end of session once supine Pain Descriptors / Indicators: Aching;Sore Pain Intervention(s): Repositioned (RN notified of chest pain - 3/10)    Home Living Family/patient expects to be discharged to:: Private residence Living Arrangements: Spouse/significant other Available Help at Discharge: Family;Available PRN/intermittently (Wife active - serves lunches at [employer] 5x/week) Type of Home: Mobile home Home Access: Ramped entrance     [place]: [place]: Environmental consultant - 2 wheels;Cane - single point;Bedside commode;Wheelchair - manual      Prior Function Level of Independence: Independent with assistive device(s);Needs assistance   Gait / Transfers Assistance Needed: Uses cane for ambulation.  Patient reports he sometimes needs assistance for safety.  ADL's / Homemaking Assistance Needed: Wife assists with bathing, meal prep, housekeeping        Hand Dominance   Dominant Hand: Right    Extremity/Trunk Assessment  Upper Extremity Assessment: RUE deficits/detail;LUE deficits/detail RUE Deficits / Details: Shoulder ROM severely limited; Tremors     LUE Deficits / Details: Shoulder ROM severely limited; Tremors, Lt > Rt   Lower Extremity Assessment: Generalized weakness (Strength grossly 3/5 - 3+/5)      Cervical / Trunk Assessment: Kyphotic  Communication   Communication: No difficulties  Cognition Arousal/Alertness: Awake/alert Behavior During Therapy: Anxious Overall Cognitive Status: History of cognitive impairments - at baseline       Memory: Decreased  short-term memory              General Comments      Exercises        Assessment/Plan    PT Assessment Patient needs continued PT services  PT Diagnosis Difficulty walking;Generalized weakness;Acute pain;Altered mental status   PT Problem List Decreased strength;Decreased range of motion;Decreased activity tolerance;Decreased balance;Decreased mobility;Decreased coordination;Decreased cognition;Decreased knowledge of use of DME;Cardiopulmonary status limiting activity;Pain  PT Treatment Interventions DME instruction;Gait training;Functional mobility training;Therapeutic activities;Therapeutic exercise;Patient/family education   PT Goals (Current goals can be found in the Care Plan section) Acute Rehab PT Goals Patient Stated Goal: To go home PT Goal Formulation: With patient/family Time For Goal Achievement: 12/21/15 Potential to Achieve Goals: Good    Frequency Min 3X/week   Barriers to discharge Decreased caregiver support Wife away from home daily    Co-evaluation               End of Session   Activity Tolerance: Patient limited by fatigue;Patient limited by pain Patient left: in bed;with call bell/phone within reach;with bed alarm set;with family/visitor present Nurse Communication: Mobility status (Chest pain 3/10 and back pain)    Functional Assessment Tool Used: Clinical judgement Functional Limitation: Mobility: Walking and moving around Mobility: Walking and Moving Around Current Status [id number]): At least 20 percent but less than 40 percent impaired, limited or restricted Mobility: Walking and Moving Around Goal Status [phone]): At least 1 percent but less than 20 percent impaired, limited or restricted    Time: 1431-1456 PT Time Calculation (min) (ACUTE ONLY): 25 min   Charges:   PT Evaluation $PT Eval Moderate Complexity: 1 Procedure PT Treatments $Therapeutic Activity: 8-22 mins   PT G Codes:   PT G-Codes **NOT FOR INPATIENT  CLASS** Functional Assessment Tool Used: Clinical judgement Functional Limitation: Mobility: Walking and moving around Mobility: Walking and Moving Around Current Status [id number]): At least 20 percent but less than 40 percent impaired, limited or restricted Mobility: Walking and Moving Around Goal Status [phone]): At least 1 percent but less than 20 percent impaired, limited or restricted    Despina Pole 12/14/2015, 3:13 PM Carita Pian. Sanjuana Kava, [place] Pager [phone]

## 2015-12-14 NOTE — Progress Notes (Addendum)
Patient's troponin up to 0.27; K Schorr NP notified at shift change by day shift RN. Raliegh Ip Schorr NP paged again at 2030; awaiting response. Patient is chest pain free at this time. Will continue to monitor.   Spoke with Lamar Blinks NP over the phone at approximately 2230; no new orders received at this time. Will continue to monitor patient.

## 2015-12-14 NOTE — ED Provider Notes (Signed)
[facility] DEPT Provider Note   CSN: [id number] Arrival date & time: 12/14/15  1007     History   Chief Complaint Chief Complaint  Patient presents with  . Chest Pain    HPI Darain Ossman is a 75 y.o. male.Complains of anterior chest pain bilateral onset this morningWhile at rest. Symptoms accounted by shortness of breath and cough. Cough is nonproductive. No fever. Pain is not made better or worse by anything. No other associated symptoms. EMS treated patient with aspirin and 3 sublingual nitroglycerin with complete relief. Patient presently asymptomatic except feels lightheaded. Nothing makes symptoms worse. Pain improved and resolved after treatment with nitroglycerin and aspirin.  HPI  Past Medical History:  Diagnosis Date  . COPD (chronic obstructive pulmonary disease) ([place])   . Depression   . Diabetes ([place])   . Essential tremor   . Heart disease   . Hyperlipidemia   . Hypertension   . Seizure disorder ([place])   . Vitamin D deficiency     Patient Active Problem List   Diagnosis Date Noted  . Skin lesion 08/14/2015  . Shortness of breath 08/14/2015  . Essential hypertension 08/14/2015  . Hyperlipidemia 08/14/2015  . Essential tremor 08/14/2015    Past Surgical History:  Procedure Laterality Date  . INGUINAL HERNIA REPAIR Left as child       Home Medications    Prior to Admission medications   Medication Sig Start Date End Date Taking? Authorizing Provider  albuterol (PROVENTIL HFA;VENTOLIN HFA) 108 (90 Base) MCG/ACT inhaler Inhale 2 puffs into the lungs every 6 (six) hours as needed for wheezing or shortness of breath. 08/14/15   Pleas Koch, NP  Fluticasone-Salmeterol (ADVAIR DISKUS) 250-50 MCG/DOSE AEPB Inhale 1 puff into the lungs every 12 (twelve) hours. 08/26/15   Pleas Koch, NP  mupirocin cream (BACTROBAN) 2 % Apply 1 application topically 2 (two) times daily. 08/14/15   Pleas Koch, NP    Family History Family History    Problem Relation Age of Onset  . Lung cancer Father     Social History Social History  Substance Use Topics  . Smoking status: Former Smoker    Quit date: 09/04/2004  . Smokeless tobacco: Never Used  . Alcohol use No     Allergies   Levaquin [levofloxacin in d5w] and Penicillins   Review of Systems Review of Systems  Constitutional: Negative.   HENT: Negative.   Respiratory: Positive for cough and shortness of breath.   Cardiovascular: Positive for chest pain.  Gastrointestinal: Negative.   Musculoskeletal: Negative.   Skin: Negative.   Neurological: Positive for tremors.  Psychiatric/Behavioral: Negative.   All other systems reviewed and are negative.    Physical Exam Updated Vital Signs BP 119/89   Pulse (!) 134   Temp 99.5 F (37.5 C) (Rectal)   Resp 23   SpO2 97%   Physical Exam  Constitutional: He appears well-developed and well-nourished. No distress.  Chronically ill-appearing  HENT:  Head: Normocephalic and atraumatic.  Eyes: Conjunctivae are normal. Pupils are equal, round, and reactive to light.  Neck: Neck supple. No tracheal deviation present. No thyromegaly present.  Cardiovascular: Regular rhythm.   No murmur heard. Tachycardic  Pulmonary/Chest: Effort normal and breath sounds normal.  Tachypnea  Abdominal: Soft. Bowel sounds are normal. He exhibits no distension. There is no tenderness.  Musculoskeletal: Normal range of motion. He exhibits no edema or tenderness.  Neurological: He is alert. Coordination normal.  Tremor of left hand  Skin:  Skin is warm and dry. No rash noted.  Psychiatric: He has a normal mood and affect.  Nursing note and vitals reviewed.    ED Treatments / Results  Labs (all labs ordered are listed, but only abnormal results are displayed) Labs Reviewed  CBC WITH DIFFERENTIAL/PLATELET  BASIC METABOLIC PANEL  D-DIMER, QUANTITATIVE (NOT AT [facility])  [place], ED    EKG  EKG  Interpretation  Date/Time:  Sunday December 14 2015 10:04:05 EDT Ventricular Rate:  104 PR Interval:    QRS Duration: 87 QT Interval:  346 QTC Calculation: 456 R Axis:   -31 Text Interpretation:  Sinus tachycardia Left axis deviation Repol abnrm suggests ischemia, diffuse leads Confirmed by Winfred Leeds  MD, Sadiel Mota ([id number]) on 12/14/2015 10:09:00 AM       Radiology No results found.  Procedures Procedures (including critical care time)  Medications Ordered in ED Medications - No data to display  Results for orders placed or performed during the hospital encounter of 12/14/15  CBC with Differential/Platelet  Result Value Ref Range   WBC 8.7 4.0 - 10.5 K/uL   RBC 5.52 4.22 - 5.81 MIL/uL   Hemoglobin 15.8 13.0 - 17.0 g/dL   HCT 47.0 39.0 - 52.0 %   MCV 85.1 78.0 - 100.0 fL   MCH 28.6 26.0 - 34.0 pg   MCHC 33.6 30.0 - 36.0 g/dL   RDW 13.2 11.5 - 15.5 %   Platelets 219 150 - 400 K/uL   Neutrophils Relative % 71 %   Neutro Abs 6.1 1.7 - 7.7 K/uL   Lymphocytes Relative 18 %   Lymphs Abs 1.6 0.7 - 4.0 K/uL   Monocytes Relative 10 %   Monocytes Absolute 0.9 0.1 - 1.0 K/uL   Eosinophils Relative 1 %   Eosinophils Absolute 0.1 0.0 - 0.7 K/uL   Basophils Relative 0 %   Basophils Absolute 0.0 0.0 - 0.1 K/uL  Basic metabolic panel  Result Value Ref Range   Sodium 136 135 - 145 mmol/L   Potassium 3.7 3.5 - 5.1 mmol/L   Chloride 102 101 - 111 mmol/L   CO2 27 22 - 32 mmol/L   Glucose, Bld 117 (H) 65 - 99 mg/dL   BUN 11 6 - 20 mg/dL   Creatinine, Ser 0.76 0.61 - 1.24 mg/dL   Calcium 8.7 (L) 8.9 - 10.3 mg/dL   GFR calc non Af Amer >60 >60 mL/min   GFR calc Af Amer >60 >60 mL/min   Anion gap 7 5 - 15  D-dimer, quantitative (not at [facility])  Result Value Ref Range   D-Dimer, Quant 0.34 0.00 - 0.50 ug/mL-FEU  Brain natriuretic peptide  Result Value Ref Range   B Natriuretic Peptide 15.6 0.0 - 100.0 pg/mL  I-stat troponin, ED  Result Value Ref Range   Troponin i, poc 0.05 0.00 - 0.08  ng/mL   Comment 3          Chest x-ray viewed by me Dg Chest Port 1 View  Result Date: 12/14/2015 CLINICAL DATA:  Left-sided chest pain. Nonproductive cough, and shortness of breath for several weeks. EXAM: PORTABLE CHEST 1 VIEW COMPARISON:  02/17/2015 FINDINGS: The heart size and mediastinal contours are within normal limits. Both lungs are clear. No evidence of pneumothorax or pleural effusion. The visualized skeletal structures are unremarkable. IMPRESSION: No active disease. Electronically Signed   By: Earle Gell M.D.   On: 12/14/2015 11:29   Initial Impression / Assessment and Plan / ED  Course  I have reviewed the triage vital signs and the nursing notes.  Pertinent labs & imaging results that were available during my care of the patient were reviewed by me and considered in my medical decision making (see chart for details).  Clinical Course  12:20 PM Remains asymptomatic. Heart score equals 7. Low pretest clinical suspicion for pulmonary embolism. Negative d-dimer  Hospitalist service consulted. Plan 23 hour observation under service of Dr.Emokpae  Final Clinical Impressions(s) / ED Diagnoses   Final diagnoses:  None   Diagnosis chest pain at rest New Prescriptions New Prescriptions   No medications on file     Orlie Dakin, MD 12/14/15 1235

## 2015-12-15 ENCOUNTER — Observation Stay (HOSPITAL_BASED_OUTPATIENT_CLINIC_OR_DEPARTMENT_OTHER): Payer: PPO

## 2015-12-15 ENCOUNTER — Observation Stay (HOSPITAL_COMMUNITY): Payer: PPO

## 2015-12-15 ENCOUNTER — Encounter (HOSPITAL_COMMUNITY): Payer: Self-pay | Admitting: Cardiology

## 2015-12-15 DIAGNOSIS — R079 Chest pain, unspecified: Secondary | ICD-10-CM

## 2015-12-15 DIAGNOSIS — I10 Essential (primary) hypertension: Secondary | ICD-10-CM | POA: Diagnosis not present

## 2015-12-15 DIAGNOSIS — I519 Heart disease, unspecified: Secondary | ICD-10-CM | POA: Diagnosis not present

## 2015-12-15 DIAGNOSIS — R0602 Shortness of breath: Secondary | ICD-10-CM | POA: Diagnosis not present

## 2015-12-15 DIAGNOSIS — E119 Type 2 diabetes mellitus without complications: Secondary | ICD-10-CM | POA: Diagnosis not present

## 2015-12-15 DIAGNOSIS — E785 Hyperlipidemia, unspecified: Secondary | ICD-10-CM | POA: Diagnosis not present

## 2015-12-15 DIAGNOSIS — R0789 Other chest pain: Secondary | ICD-10-CM | POA: Diagnosis not present

## 2015-12-15 DIAGNOSIS — R55 Syncope and collapse: Secondary | ICD-10-CM | POA: Diagnosis not present

## 2015-12-15 DIAGNOSIS — R7989 Other specified abnormal findings of blood chemistry: Secondary | ICD-10-CM

## 2015-12-15 DIAGNOSIS — Z87891 Personal history of nicotine dependence: Secondary | ICD-10-CM | POA: Diagnosis not present

## 2015-12-15 DIAGNOSIS — J449 Chronic obstructive pulmonary disease, unspecified: Secondary | ICD-10-CM | POA: Diagnosis not present

## 2015-12-15 LAB — ECHOCARDIOGRAM COMPLETE
AVLVOTPG: 5 mmHg
CHL CUP MV DEC (S): 215
CHL CUP STROKE VOLUME: 29 mL
E decel time: 215 msec
EERAT: 6.55
FS: 30 % (ref 28–44)
HEIGHTINCHES: 69 in
IVS/LV PW RATIO, ED: 0.91
LA ID, A-P, ES: 32 mm
LA diam index: 1.75 cm/m2
LA vol A4C: 34.9 ml
LA vol index: 21.1 mL/m2
LA vol: 38.6 mL
LEFT ATRIUM END SYS DIAM: 32 mm
LV PW d: 11.1 mm — AB (ref 0.6–1.1)
LV SIMPSON'S DISK: 64
LV TDI E'MEDIAL: 7.83
LV dias vol index: 24 mL/m2
LV dias vol: 45 mL — AB (ref 62–150)
LV e' LATERAL: 7.18 cm/s
LV sys vol: 16 mL — AB (ref 21–61)
LVEEAVG: 6.55
LVEEMED: 6.55
LVOT VTI: 18.9 cm
LVOT area: 2.01 cm2
LVOT diameter: 16 mm
LVOTPV: 115 cm/s
LVOTSV: 38 mL
LVSYSVOLIN: 9 mL/m2
MV pk A vel: 75.4 m/s
MVPKEVEL: 47 m/s
TDI e' lateral: 7.18
WEIGHTICAEL: 2430.4 [oz_av]

## 2015-12-15 LAB — NM MYOCAR MULTI W/SPECT W/WALL MOTION / EF
CHL CUP MPHR: 147 {beats}/min
CHL CUP RESTING HR STRESS: 90 {beats}/min
CSEPED: 0 min
CSEPEW: 1 METS
Exercise duration (sec): 0 s
Peak HR: 162 {beats}/min
Percent HR: 110 %

## 2015-12-15 LAB — TROPONIN I: Troponin I: 0.24 ng/mL (ref <0.03)

## 2015-12-15 LAB — RPR: RPR: NONREACTIVE

## 2015-12-15 MED ORDER — TECHNETIUM TC 99M TETROFOSMIN IV KIT
10.0000 | PACK | Freq: Once | INTRAVENOUS | Status: AC | PRN
  Administered 2015-12-15: 10 via INTRAVENOUS

## 2015-12-15 MED ORDER — ALBUTEROL SULFATE (2.5 MG/3ML) 0.083% IN NEBU: 2.5000 mg | INHALATION_SOLUTION | RESPIRATORY_TRACT | Status: DC | PRN

## 2015-12-15 MED ORDER — PERFLUTREN LIPID MICROSPHERE
1.0000 mL | INTRAVENOUS | Status: AC | PRN
  Administered 2015-12-15: 3 mL via INTRAVENOUS
  Filled 2015-12-15: qty 10

## 2015-12-15 MED ORDER — REGADENOSON 0.4 MG/5ML IV SOLN
INTRAVENOUS | Status: AC
  Filled 2015-12-15: qty 5

## 2015-12-15 MED ORDER — TECHNETIUM TC 99M TETROFOSMIN IV KIT
30.0000 | PACK | Freq: Once | INTRAVENOUS | Status: AC | PRN
  Administered 2015-12-15: 30 via INTRAVENOUS

## 2015-12-15 MED ORDER — CYANOCOBALAMIN 1000 MCG/ML IJ SOLN
100.0000 ug | Freq: Every day | INTRAMUSCULAR | Status: DC
  Administered 2015-12-15 – 2015-12-16 (×2): 100 ug via INTRAMUSCULAR
  Filled 2015-12-15 (×2): qty 0.1

## 2015-12-15 MED ORDER — REGADENOSON 0.4 MG/5ML IV SOLN
0.4000 mg | Freq: Once | INTRAVENOUS | Status: AC
  Administered 2015-12-15: 0.4 mg via INTRAVENOUS
  Filled 2015-12-15: qty 5

## 2015-12-15 MED ORDER — LORAZEPAM 2 MG/ML IJ SOLN: 1.0000 mg | Freq: Four times a day (QID) | INTRAMUSCULAR | Status: DC | PRN

## 2015-12-15 MED ORDER — REGADENOSON 0.4 MG/5ML IV SOLN
INTRAVENOUS | Status: AC
Start: 2015-12-15 — End: 2015-12-15
  Administered 2015-12-15: 0.4 mg via INTRAVENOUS
  Filled 2015-12-15: qty 5

## 2015-12-15 NOTE — Progress Notes (Signed)
*  PRELIMINARY RESULTS* Echocardiogram 2D Echocardiogram has been performed with Definity.  [name] 12/15/2015, 4:44 PM

## 2015-12-15 NOTE — Consult Note (Signed)
NEURO HOSPITALIST CONSULT NOTE   Requestig physician: Dr. Tyrell Antonio   Reason for Consult: Seizure   HPI:                                                                                                                                          [name] is an 75 y.o. male who was initially brought to hospital for chest pain. Per Care everywhere 2008 "He does report that his symptoms began approximately three years ago when he became angry at work, quit a job. He then came home and was diagnosed with a seizure disorder, put on antiseizure therapy and has essentially declined and be unable to work since that time. His wife and son care for him almost completely, and in December of this year he started having more and more seizures and was brought to the [facility] ER. Subsequent workup with video monitoring EEG documented motor activity resembling a seizure without epilepsy changes on his EEG. He was tapered from his Depakote and Tegretol and placed on carbidopa-levodopa for severe tremor, and his son reports that he is improved outside the hospital." He later was seen on "04/2014 for EEG which showed tremor without associated seizure activity." After Myoview today " patient was transitioning from bed to chair, he had a  episode where his body was stiff, arms shaking, eyes still open. He was unresponsive to command for 10 sec before resuming baseline mental status."   Patient sees Dr. Carles Collet in [place] neurology--he has been diagnosed with multiple system atrophy. He is also on Sinemet. Both of these can cause significant orthostatic hypotension.  Patient clearly states that he felt as if the surroundings were getting dim and he was going to faint. His wife states that he often does this when he transitions from a lying to standing position and then he gets a headache.     Past Medical History:  Diagnosis Date  . Chest pain   . COPD (chronic obstructive pulmonary disease)  ([place])   . Depression   . Diabetes ([place])   . Dysphagia   . Essential tremor   . Heart disease   . Hyperlipidemia   . Hypertension   . Seizure disorder ([place])   . Tardive dyskinesia   . Vitamin D deficiency     Past Surgical History:  Procedure Laterality Date  . INGUINAL HERNIA REPAIR Left as child    Family History  Problem Relation Age of Onset  . Lung cancer Father   . CAD Brother     MI in his 78s       Social History:  reports that he quit smoking about 11 years ago. He has never used smokeless tobacco. He reports that he does not drink alcohol or use drugs.  Allergies  Allergen Reactions  . Levaquin [Levofloxacin In D5w] Nausea And Vomiting  . Penicillins Hives    MEDICATIONS:                                                                                                                     Prior to Admission:  Prescriptions Prior to Admission  Medication Sig Dispense Refill Last Dose  . albuterol (PROVENTIL HFA;VENTOLIN HFA) 108 (90 Base) MCG/ACT inhaler Inhale 2 puffs into the lungs every 6 (six) hours as needed for wheezing or shortness of breath. (Patient not taking: Reported on 12/14/2015) 1 Inhaler 2 Not Taking at Unknown time  . Fluticasone-Salmeterol (ADVAIR DISKUS) 250-50 MCG/DOSE AEPB Inhale 1 puff into the lungs every 12 (twelve) hours. (Patient not taking: Reported on 12/14/2015) 60 each 5 Not Taking at Unknown time  . mupirocin cream (BACTROBAN) 2 % Apply 1 application topically 2 (two) times daily. (Patient not taking: Reported on 12/14/2015) 15 g 0 Not Taking at Unknown time   Scheduled: . regadenoson      . aspirin EC  81 mg Oral Daily  . Carbidopa-Levodopa ER  1 tablet Oral BID  . cyanocobalamin  100 mcg Intramuscular Daily  . heparin  5,000 Units Subcutaneous Q8H  . metoprolol tartrate  12.5 mg Oral BID  . mometasone-formoterol  2 puff Inhalation BID     ROS:                                                                                                                                        History obtained from the patient  General ROS: negative for - chills, fatigue, fever, night sweats, weight gain or weight loss Psychological ROS: negative for - behavioral disorder, hallucinations, memory difficulties, mood swings or suicidal ideation Ophthalmic ROS: negative for - blurry vision, double vision, eye pain or loss of vision ENT ROS: negative for - epistaxis, nasal discharge, oral lesions, sore throat, tinnitus or vertigo Allergy and Immunology ROS: negative for - hives or itchy/watery eyes Hematological and Lymphatic ROS: negative for - bleeding problems, bruising or swollen lymph nodes Endocrine ROS: negative for - galactorrhea, hair pattern changes, polydipsia/polyuria or temperature intolerance Respiratory ROS: negative for - cough, hemoptysis, shortness of breath or wheezing Cardiovascular ROS: negative for - chest pain, dyspnea on exertion, edema or irregular heartbeat Gastrointestinal ROS: negative for - abdominal pain, diarrhea, hematemesis, nausea/vomiting or stool incontinence Genito-Urinary ROS: negative  for - dysuria, hematuria, incontinence or urinary frequency/urgency Musculoskeletal ROS: negative for - joint swelling or muscular weakness Neurological ROS: as noted in HPI Dermatological ROS: negative for rash and skin lesion changes   Blood pressure (!) 147/80, pulse (!) 103, temperature 97.7 F (36.5 C), temperature source Oral, resp. rate (!) 22, height 5\' 9"  (1.753 m), weight 68.9 kg (151 lb 14.4 oz), SpO2 100 %.   Neurologic Examination:                                                                                                      HEENT-  Normocephalic, no lesions, without obvious abnormality.  Normal external eye and conjunctiva.  Normal TM's bilaterally.  Normal auditory canals and external ears. Normal external nose, mucus membranes and septum.  Normal pharynx. Cardiovascular- S1, S2 normal, pulses palpable  throughout   Lungs- chest clear, no wheezing, rales, normal symmetric air entry Abdomen- normal findings: bowel sounds normal Extremities- less then 2 second capillary refill Lymph-no adenopathy palpable Musculoskeletal-no joint tenderness, deformity or swelling Skin-warm and dry, no hyperpigmentation, vitiligo, or suspicious lesions  Neurological Examination Mental Status: Alert, oriented, thought content appropriate.  Speech fluent without evidence of aphasia.  Able to follow 3 step commands without difficulty. Cranial Nerves: II:  Visual fields grossly normal, pupils equal, round, reactive to light and accommodation III,IV, VI: ptosis not present, extra-ocular motions intact bilaterally V,VII: smile symmetric, facial light touch sensation normal bilaterally VIII: hearing normal bilaterally IX,X: uvula rises symmetrically XI: bilateral shoulder shrug XII: midline tongue extension Motor: Able to move all extremities antigravity with significant tremor which is resting. His tremor is worse on the left than it is on the right. He has decreased rapid alternating movements of bilateral upper extremities. He has increased tone in bilateral upper extremities and lower extremities. Sensory: Pinprick and light touch intact throughout, bilaterally Deep Tendon Reflexes: 1+ and symmetric throughout Plantars: Right: downgoing   Left: downgoing Cerebellar: Significant tremor which becomes worse at endpoint with finger-to-nose,  Gait: Not tested      Lab Results: Basic Metabolic Panel:  Recent Labs Lab 12/14/15 1105 12/14/15 1423  NA 136  --   K 3.7  --   CL 102  --   CO2 27  --   GLUCOSE 117*  --   BUN 11  --   CREATININE 0.76 0.80  CALCIUM 8.7*  --     Liver Function Tests: No results for input(s): AST, ALT, ALKPHOS, BILITOT, PROT, ALBUMIN in the last 168 hours. No results for input(s): LIPASE, AMYLASE in the last 168 hours. No results for input(s): AMMONIA in the last 168  hours.  CBC:  Recent Labs Lab 12/14/15 1105  WBC 8.7  NEUTROABS 6.1  HGB 15.8  HCT 47.0  MCV 85.1  PLT 219    Cardiac Enzymes:  Recent Labs Lab 12/14/15 1423 12/14/15 1818 12/15/15 0301  TROPONINI 0.15* 0.27* 0.24*    Lipid Panel:  Recent Labs Lab 12/14/15 1423  CHOL 197  TRIG 174*  HDL 32*  CHOLHDL 6.2  VLDL 35  LDLCALC  130*    CBG: No results for input(s): GLUCAP in the last 168 hours.  Microbiology: No results found for this or any previous visit.  Coagulation Studies: No results for input(s): LABPROT, INR in the last 72 hours.  Imaging: Dg Chest Port 1 View  Result Date: 12/14/2015 CLINICAL DATA:  Left-sided chest pain. Nonproductive cough, and shortness of breath for several weeks. EXAM: PORTABLE CHEST 1 VIEW COMPARISON:  02/17/2015 FINDINGS: The heart size and mediastinal contours are within normal limits. Both lungs are clear. No evidence of pneumothorax or pleural effusion. The visualized skeletal structures are unremarkable. IMPRESSION: No active disease. Electronically Signed   By: Earle Gell M.D.   On: 12/14/2015 11:29       Assessment and plan per attending neurologist  Etta Quill PA-C [facility] [phone]  12/15/2015, 1:06 PM   Assessment/Plan: This is 75 year old male with known Parkinson's and multiple system atrophy. Patient has been thoroughly evaluated by both [facility] and neurology in the [place] area. He has had multiple EEGs which showed no epileptiform activity but does show significant tremor. Reading through the notes it appears that he's had multiple episodes that appear to be syncopal episodes most likely secondary to his multiple system atrophy and orthostatic hypotension. Today while he was post Myoview patient was transferred from a lying position to a sitting position and had a 5-10 second period in which his eyes rolled back with blank stare. Patient describes the sensation of fainting and the room closing  in around him. At this time given his history and description do not believe that this is seizure activity. At this time would not initiate any form of epileptic drug. Would have patient follow-up with Dr. Carles Collet as an outpatient.

## 2015-12-15 NOTE — Care Management Note (Signed)
Case Management Note  Patient Details  Name: [name] MRN: [id number] Date of Birth: 26-Jul-1940  Subjective/Objective: Pt presented for chest pain. Plan for stress test.  Pt is from home with wife. Wife works from 8 am to 11:30 am. Per wife pt stays in the bed until she gets home. Per wife they had [place] in the past and were not beneficial.                  Action/Plan: Pt is refusing HH Services at this time. Plan will be to return home without any HH Services at this time.   Expected Discharge Date:                  Expected Discharge Plan:  Home/Self Care  In-House Referral:  NA  Discharge planning Services  CM Consult  Post Acute Care Choice:  NA Choice offered to:  NA  DME Arranged:  N/A DME Agency:  NA  HH Arranged:  NA HH Agency:  NA  Status of Service:  Completed, signed off  If discussed at [place] of Stay Meetings, dates discussed:    Additional Comments:  Bethena Roys, RN 12/15/2015, 11:16 AM

## 2015-12-15 NOTE — Progress Notes (Signed)
Patient called out complaining of chest pain. Upon my arrival to the room, patient stated that he was having 8/10 sharp, left sided chest pain that radiated down his left arm. One sublingual nitroglycerin was given to the patient which brought his pain to a 2/10. Additional nitroglycerin was not given because the patient's BP dropped from 153/88 to 111/79 after administration. An EKG was obtained. K Schorr NP was paged and notified of the above, including patient's troponin trend. Will continue to closely monitor.

## 2015-12-15 NOTE — Consult Note (Signed)
Patient ID: [name] MRN: [id number], DOB/AGE: 1941/02/28   Admit date: 12/14/2015   Reason for Consult: Chest Pain Requesting MD: Dr. Dyanne Carrel, Internal Medicine    Primary Physician: Sheral Flow, NP Primary Cardiologist: New  Pt. Profile:  75 y/o male with no prior cardiac history but multiple risk factors including HTN, HLD, family h/o CAD, remote history of heavy tobacco use, medication noncompliance as well as a h/o COPD, admitted for chest pain observation. Troponin's mildly elevated x 3.   Problem List  Past Medical History:  Diagnosis Date  . Chest pain   . COPD (chronic obstructive pulmonary disease) ([place])   . Depression   . Diabetes ([place])   . Dysphagia   . Essential tremor   . Heart disease   . Hyperlipidemia   . Hypertension   . Seizure disorder ([place])   . Tardive dyskinesia   . Vitamin D deficiency     Past Surgical History:  Procedure Laterality Date  . INGUINAL HERNIA REPAIR Left as child     Allergies  Allergies  Allergen Reactions  . Levaquin [Levofloxacin In D5w] Nausea And Vomiting  . Penicillins Hives    HPI 75 y/o male with no prior cardiac history but multiple risk factors including HTN, HLD, family h/o CAD, remote history of heavy tobacco use, medication noncompliance as well as a h/o COPD, admitted for chest pain observation.   He also has a h/o mild dementia and an essential tremor. He has been noncompliant with meds. He reports that he self discontinued all of his meds over a year ago b/c they "weren't doing any good".  He notes a family h/o CAD. His brother had an MI in his 3s. He is still living. He also reports that he was a heavy smoker for roughly 50 years. He smoked 3ppd before quitting 10 years ago. He also states that he was sent to [facility] in his 40s for CP but w/u was unremarkable. He does not remember if he had a cath.   He presented to the ED on 12/14/15 with CC of left sided chest pain radiating down his  left arm. He reports he has had symptoms of sharp chest pain and exertional dyspnea off and on for several months that have worsened. Symptoms are exacerbated by stress and exertion and improved with rest. He gets short of breath walking from room to room in his house. Yesterday he developed resting chest pain while sitting in a chair. It was more severe compared to previous episodes and did not go away on its own, prompting him to call EMS. On arrival, he was given SL NTG which helped to ease off the discomfort. By the time he arrived to the ED, he was pain free. He denies any current CP.  Troponins are mildly elevated x 3 at 0.15>>0.027>>0.24. CBC unremarkable. Normal WBC and H/H. CXR unremarkable, negative for infiltrate. BMP also WNL. SCr/BUN 0.76/11. Initial EKG showed slight ST depressions in leads II, V4-V6. ? Ischemia vs repol abnormalities. 2D echo pending.    Home Medications  Prior to Admission medications   Medication Sig Start Date End Date Taking? Authorizing Provider  albuterol (PROVENTIL HFA;VENTOLIN HFA) 108 (90 Base) MCG/ACT inhaler Inhale 2 puffs into the lungs every 6 (six) hours as needed for wheezing or shortness of breath. Patient not taking: Reported on 12/14/2015 08/14/15   Pleas Koch, NP  Fluticasone-Salmeterol (ADVAIR DISKUS) 250-50 MCG/DOSE AEPB Inhale 1 puff into the lungs every 12 (twelve)  hours. Patient not taking: Reported on 12/14/2015 08/26/15   Pleas Koch, NP  mupirocin cream (BACTROBAN) 2 % Apply 1 application topically 2 (two) times daily. Patient not taking: Reported on 12/14/2015 08/14/15   Pleas Koch, NP    Family History  Family History  Problem Relation Age of Onset  . Lung cancer Father   . CAD Brother     MI in his 18s     Social History  Social History   Social History  . Marital status: Married    Spouse name: N/A  . Number of children: N/A  . Years of education: N/A   Occupational History  . Not on file.   Social History  Main Topics  . Smoking status: Former Smoker    Quit date: 09/04/2004  . Smokeless tobacco: Never Used  . Alcohol use No  . Drug use: No  . Sexual activity: Not on file   Other Topics Concern  . Not on file   Social History Narrative   Married.   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   Enjoys spending time with family.      Review of Systems General:  No chills, fever, night sweats or weight changes.  Cardiovascular:  + chest pain, +dyspnea on exertion,  No edema, orthopnea, palpitations, paroxysmal nocturnal dyspnea. Dermatological: No rash, lesions/masses Respiratory: No cough, dyspnea Urologic: No hematuria, dysuria Abdominal:   No nausea, vomiting, diarrhea, bright red blood per rectum, melena, or hematemesis Neurologic:  No visual changes, wkns, changes in mental status. All other systems reviewed and are otherwise negative except as noted above.  Physical Exam  Blood pressure 111/79, pulse 91, temperature 97.7 F (36.5 C), temperature source Oral, resp. rate (!) 23, height 5\' 9"  (1.753 m), weight 151 lb 14.4 oz (68.9 kg), SpO2 96 %.  General: Pleasant, NAD, elderly, anxious, resting tremor  Psych: Normal affect. Neuro: Alert and oriented X 3. Moves all extremities spontaneously. HEENT: Normal  Neck: Supple without bruits or JVD. Lungs:  Resp regular and unlabored, CTA. Heart: RRR no s3, s4, or murmurs. Abdomen: Soft, non-tender, non-distended, BS + x 4.  Extremities: No clubbing, cyanosis or edema. DP/PT/Radials 2+ and equal bilaterally.  Labs  Troponin [facility] Test)  Recent Labs  12/14/15 1123  TROPIPOC 0.05    Recent Labs  12/14/15 1423 12/14/15 1818 12/15/15 0301  TROPONINI 0.15* 0.27* 0.24*   Lab Results  Component Value Date   WBC 8.7 12/14/2015   HGB 15.8 12/14/2015   HCT 47.0 12/14/2015   MCV 85.1 12/14/2015   PLT 219 12/14/2015     Recent Labs Lab 12/14/15 1105 12/14/15 1423  NA 136  --   K 3.7  --   CL 102  --   CO2 27   --   BUN 11  --   CREATININE 0.76 0.80  CALCIUM 8.7*  --   GLUCOSE 117*  --    Lab Results  Component Value Date   CHOL 197 12/14/2015   HDL 32 (L) 12/14/2015   LDLCALC 130 (H) 12/14/2015   TRIG 174 (H) 12/14/2015   Lab Results  Component Value Date   DDIMER 0.34 12/14/2015     Radiology/Studies  Dg Chest Port 1 View  Result Date: 12/14/2015 CLINICAL DATA:  Left-sided chest pain. Nonproductive cough, and shortness of breath for several weeks. EXAM: PORTABLE CHEST 1 VIEW COMPARISON:  02/17/2015 FINDINGS: The heart size and mediastinal contours are within normal limits. Both lungs are clear.  No evidence of pneumothorax or pleural effusion. The visualized skeletal structures are unremarkable. IMPRESSION: No active disease. Electronically Signed   By: Earle Gell M.D.   On: 12/14/2015 11:29    ECG  NSR slight ST depressions in leads II, V4-V6. ? Ischemia vs repol abnormalities.  Echocardiogram - pending    ASSESSMENT AND PLAN  Principal Problem:   Chest pain Active Problems:   Skin lesion   Essential hypertension   Hyperlipidemia   Essential tremor   COPD (chronic obstructive pulmonary disease) (HCC)   Heart disease   Encephalopathy chronic   Dysphagia   1. Chest Pain with Moderate Risk for Cardiac Etiology: Mixed typical + atypical features. His pain is described as sharp pain, however he has had exertional dyspnea relieved with rest. ? Cardiac vs COPD. He has multiple cardiac risk factor including a 50 year h/o heavy tobacco use (quit 10 years ago), h/o untreated HTN and HLD due to medication noncompliance (quit all meds ~1 year ago), as well as a reported family h/o premature CAD. He notes his brother had a MI in his 69s. His cardiac enzymes are abnormal x 3 at 0.15>>0.27>>0.24. Initial EKG showed NSR slight ST depressions in leads II, V4-V6. ? Ischemia vs repol abnormalities. He is currently CP free. Will discuss plan with MD. May opt for NST as part of initial w/u.  Given compliance issues, he may not be an ideal candidate for PCI/ DAPT. May opt for medical management.     Signed, Lyda Jester, PA-C 12/15/2015, 8:38 AM  The patient was seen and examined, and I agree with the history, physical exam, assessment and plan as documented above which has been discussed with B. Simmons, with modifications as noted below. Pt with 3-4 month h/o chest pain occurring both with and without exertion. Nonspecific ST changes during this admission with mild troponin elevation. Currently chest pain free. Multiple CV risk factors. Very anxious. Currently on ASA and metoprolol. Will arrange for nuclear stress test today Carlton Adam).  Kate Sable, MD, [name]  12/15/2015 9:02 AM

## 2015-12-15 NOTE — Progress Notes (Signed)
Physical Therapy Treatment Patient Details Name: [name] MRN: [id number] DOB: 1940/06/02 Today's Date: 12/15/2015    History of Present Illness Patient is a 75 yo male admitted 12/14/15 with chest pain and confusion.    PMH:  Anxiety, depression, HTN, HLD, COPD, Tremors (Parkinson's ruled out), seizure, chronic encephalopathy, dysphagia    PT Comments    Pt pleasant and reports no pain. Pt eager to ambulate and be OOB. Pt with greatly improved mobility today. In chair end of session pt completed HEP with HR maintaining 103-107 with all activity. RA lead came off end of session and after being replaced pt with acute onset tachycardia sitting in chair with HR 137, pt reported brief blurry vision but no pain or chest palpitations. No further activity at this time secondary to tachycardia. Will continue to follow.   Follow Up Recommendations  Home health PT;Supervision for mobility/OOB     Equipment Recommendations       Recommendations for Other Services       Precautions / Restrictions Precautions Precautions: Fall Precaution Comments: Constant tremors Restrictions Weight Bearing Restrictions: No    Mobility  Bed Mobility Overal bed mobility: Modified Independent             General bed mobility comments: use of rail and increased time  Transfers Overall transfer level: Needs assistance   Transfers: Sit to/from Stand Sit to Stand: Min guard         General transfer comment: cues for hand placement and safety  Ambulation/Gait Ambulation/Gait assistance: Min assist Ambulation Distance (Feet): 160 Feet Assistive device: Straight cane Gait Pattern/deviations: Step-through pattern;Decreased stride length;Trunk flexed   Gait velocity interpretation: Below normal speed for age/gender General Gait Details: guarding for safety, pt with good use of cane, maintains flexed posture despite cues and tremor throughout   Stairs            Wheelchair  Mobility    Modified Rankin (Stroke Patients Only)       Balance Overall balance assessment: Needs assistance   Sitting balance-Leahy Scale: Fair       Standing balance-Leahy Scale: Poor                      Cognition Arousal/Alertness: Awake/alert Behavior During Therapy: Flat affect Overall Cognitive Status: History of cognitive impairments - at baseline       Memory: Decreased short-term memory              Exercises General Exercises - Lower Extremity Long Arc Quad: AROM;Both;15 reps;Seated Hip Flexion/Marching: AROM;Both;10 reps;Seated    General Comments        Pertinent Vitals/Pain Pain Assessment: No/denies pain    Home Living                      Prior Function            PT Goals (current goals can now be found in the care plan section) Progress towards PT goals: Progressing toward goals    Frequency       PT Plan Current plan remains appropriate    Co-evaluation             End of Session Equipment Utilized During Treatment: Gait belt Activity Tolerance: Patient tolerated treatment well Patient left: in chair;with call bell/phone within reach;with chair alarm set;with family/visitor present     Time: [id number] PT Time Calculation (min) (ACUTE ONLY): 20 min  Charges:  $Gait Training: 8-22 mins  G CodesLanetta Inch Beth December 26, 2015, 10:15 AM Elwyn Reach, [place]

## 2015-12-15 NOTE — Progress Notes (Addendum)
Prior to the start of myoview, he had a SVT episode with HR 160s, eventually resolved, proceeded with lexiscan myoview. Myoview was uncomplicated, however while patient was transitioning from bed to chair, he had a seizure episode where his body was stiff, arms shaking, eyes still open. He was unresponsive to command for 10 sec before resuming baseline mental status. He was not on monitor at the time of transfer. Once we hooked him back up to monitor, he was in NSR with SBP 180 which eventually came back down to 140s. I have discussed with Dr. Bronson Ing and Dr. Tyrell Antonio. I did not call rapid response as patient quickly regained consciousness and does not appears to have any residual deficit.  Hilbert Corrigan PA Pager: [phone]

## 2015-12-15 NOTE — Care Management Obs Status (Signed)
[place] NOTIFICATION   Patient Details  Name: [name] MRN: [id number] Date of Birth: [birth year]   Medicare Observation Status Notification Given:  Yes    Bethena Roys, RN 12/15/2015, 10:57 AM

## 2015-12-15 NOTE — Progress Notes (Signed)
PROGRESS NOTE    [name]  [id number] DOB: May 22, 1940 DOA: 12/14/2015 PCP: Sheral Flow, NP   Brief Narrative: [name] is a pleasant but anxious 75 y.o. male with medical history significant for hypertension, hyperlipidemia, COPD since to the emergency Department chief complaint chest pain. Patient being admitted rule out.  Information is obtained from the patient and his wife who is at the bedside. Patient reports he's been "confused" for over a year. Wife reports this is no worse than usual. She states he's been coughing for the last 3-4 days. He reports the cough is nonproductive This morning during a coughing spell he developed left anterior chest pain that radiated to his left arm. Associated symptoms include lightheadedness but no syncope or near-syncope. Denies palpitation lower extremity edema or orthopnea. Denies worsening shortness of breath. Of note he has been prescribed an inhaler but doesn't use it.   EMS gave him nitroglycerin 2 and aspirin. The point of admission he is pain-free    Assessment & Plan:   Principal Problem:   Chest pain Active Problems:   Skin lesion   Essential hypertension   Hyperlipidemia   Essential tremor   COPD (chronic obstructive pulmonary disease) (HCC)   Heart disease   Encephalopathy chronic   Dysphagia    1. Chest pain. Some typical and atypical features. Heart score is 6. Initial troponin is negative. EKG as noted above. Chest x-ray with no active disease. Rest factors include hypertension, hyperlipidemia, CAD.  -troponin negative.  -plan for stress test today   2. Seizure; History of seizure. Currently not on any medications. Chart review indicates evaluated by neurology 2. His EEG an ambulatory EEG were negative. Denies any seizure activity in the last year -Called by cardiology PA, prior to [place] patient had SVT episode 160 which resolved. Myoview completed, while  patient was transitioning  from bed to chair he develops what appears to be seizure episode, he became unresponsive, generalized stiffness and arms shaking. Patient back to baseline.  -I have order Ativan PRN for seizure.  -CT head.  -Neurology consulted. They will determine if patient need medications.    Tachycardia on presentation. Resolved on admission. EKG as noted above. -Obtain a TSH -metoprolol -monitor  3. COPD. Appears stable at baseline. Respiratory effort somewhat increased but wife reports this is his baseline. Chest x-ray as noted above. He is not hypoxic. Reports he was on inhaler in the past but stopped taking this. Chart review indicates former smoker with shortness of breath at rest. CT and chest x-ray from 2016 with emphysema. D-dimer negative BNP within the limits of normal -Supplemental oxygen as indicated -Wean as able -Albuterol inhaler -Advair -Scheduled meds  4. Hypertension.  - started low dose metoprolol with parameters   5. Hyperlipidemia. Chart review indicates patient once managed with pravastatin  -Obtain a lipid panel -pravastatin  6. Essential tremor. Chart review indicates neurology ruled out Parkinson's.  Once managed with benztropine  And carbidopa-levadopa is not taken in over a year. He states he did not think helped -carbidopa -Physical therapy consult  7. Depression/anxiety.  -Xanax low dose twice a day as needed  8. Dementia.  - B-12  Low, will start supplements. B 12 injection for 6 days  - RPR; non reactive -outpatient follow up with neuro  10. Dysphasia. Chart review indicates he had a barium swallow in 2016 that demonstrated a moderate oral phase and mild cervical phase dysphasia. Dysphasia 3 mechanical soft diet with thin liquids was recommended.  Wife reports he is not following this     DVT prophylaxis: (Lovenox/Heparin/SCD's/anticoagulated/None (if comfort care) Code Status: (Full/Partial - specify details) Family Communication: (Specify name,  relationship & date discussed. NO "discussed with patient") Disposition Plan: (specify when and where you expect patient to be discharged). Include barriers to DC in this tab.   Consultants:   Cardiology  Neurology   Procedures:   Stress test    Antimicrobials:   none   Subjective: He denies chest pain.  Had chest pain yesterday   Objective: Vitals:   12/14/15 2138 12/15/15 0415 12/15/15 0429 12/15/15 0808  BP: 132/64 (!) 153/88 111/79   Pulse: 91     Resp: (!) 25 (!) 22 (!) 23   Temp: 97.9 F (36.6 C)  97.7 F (36.5 C)   TempSrc: Axillary  Oral   SpO2: 97%  96% 96%  Weight:   68.9 kg (151 lb 14.4 oz)   Height:   5\' 9"  (1.753 m)     Intake/Output Summary (Last 24 hours) at 12/15/15 0839 Last data filed at 12/15/15 0400  Gross per 24 hour  Intake              600 ml  Output              400 ml  Net              200 ml   Filed Weights   12/15/15 0429  Weight: 68.9 kg (151 lb 14.4 oz)    Examination:  General exam: Appears calm and comfortable , chronic tremors.  Respiratory system: Clear to auscultation. Respiratory effort normal. Cardiovascular system: S1 & S2 heard, RRR. No JVD, murmurs, rubs, gallops or clicks. No pedal edema. Gastrointestinal system: Abdomen is nondistended, soft and nontender. No organomegaly or masses felt. Normal bowel sounds heard. Central nervous system: Alert and oriented. Chronic tremors.  Extremities: Symmetric 5 x 5 power. Skin: No rashes, lesions or ulcers    Data Reviewed: I have personally reviewed following labs and imaging studies  CBC:  Recent Labs Lab 12/14/15 1105  WBC 8.7  NEUTROABS 6.1  HGB 15.8  HCT 47.0  MCV 85.1  PLT [id number]   Basic Metabolic Panel:  Recent Labs Lab 12/14/15 1105 12/14/15 1423  NA 136  --   K 3.7  --   CL 102  --   CO2 27  --   GLUCOSE 117*  --   BUN 11  --   CREATININE 0.76 0.80  CALCIUM 8.7*  --    GFR: Estimated Creatinine Clearance: 80.1 mL/min (by C-G formula based  on SCr of 0.8 mg/dL). Liver Function Tests: No results for input(s): AST, ALT, ALKPHOS, BILITOT, PROT, ALBUMIN in the last 168 hours. No results for input(s): LIPASE, AMYLASE in the last 168 hours. No results for input(s): AMMONIA in the last 168 hours. Coagulation Profile: No results for input(s): INR, PROTIME in the last 168 hours. Cardiac Enzymes:  Recent Labs Lab 12/14/15 1423 12/14/15 1818 12/15/15 0301  TROPONINI 0.15* 0.27* 0.24*   BNP (last 3 results) No results for input(s): PROBNP in the last 8760 hours. HbA1C: No results for input(s): HGBA1C in the last 72 hours. CBG: No results for input(s): GLUCAP in the last 168 hours. Lipid Profile:  Recent Labs  12/14/15 1423  CHOL 197  HDL 32*  LDLCALC 130*  TRIG 174*  CHOLHDL 6.2   Thyroid Function Tests:  Recent Labs  12/14/15 1423  TSH 0.843   Anemia  Panel:  Recent Labs  12/14/15 1423  VITAMINB12 118*   Sepsis Labs: No results for input(s): PROCALCITON, LATICACIDVEN in the last 168 hours.  No results found for this or any previous visit (from the past 240 hour(s)).       Radiology Studies: Dg Chest Port 1 View  Result Date: 12/14/2015 CLINICAL DATA:  Left-sided chest pain. Nonproductive cough, and shortness of breath for several weeks. EXAM: PORTABLE CHEST 1 VIEW COMPARISON:  02/17/2015 FINDINGS: The heart size and mediastinal contours are within normal limits. Both lungs are clear. No evidence of pneumothorax or pleural effusion. The visualized skeletal structures are unremarkable. IMPRESSION: No active disease. Electronically Signed   By: Earle Gell M.D.   On: 12/14/2015 11:29        Scheduled Meds: . albuterol  2.5 mg Nebulization TID  . aspirin EC  81 mg Oral Daily  . Carbidopa-Levodopa ER  1 tablet Oral BID  . heparin  5,000 Units Subcutaneous Q8H  . metoprolol tartrate  12.5 mg Oral BID  . mometasone-formoterol  2 puff Inhalation BID   Continuous Infusions:    LOS: 0 days    Time  spent: 35 minutes.     Elmarie Shiley, MD [facility] Pager [phone]  If 7PM-7AM, please contact night-coverage [email or web address] Password [facility] 12/15/2015, 8:39 AM

## 2015-12-16 ENCOUNTER — Other Ambulatory Visit: Payer: Self-pay

## 2015-12-16 DIAGNOSIS — R0789 Other chest pain: Secondary | ICD-10-CM | POA: Diagnosis not present

## 2015-12-16 DIAGNOSIS — R079 Chest pain, unspecified: Secondary | ICD-10-CM | POA: Diagnosis not present

## 2015-12-16 DIAGNOSIS — I10 Essential (primary) hypertension: Secondary | ICD-10-CM | POA: Diagnosis not present

## 2015-12-16 DIAGNOSIS — J441 Chronic obstructive pulmonary disease with (acute) exacerbation: Secondary | ICD-10-CM

## 2015-12-16 LAB — FOLATE RBC
FOLATE, HEMOLYSATE: 355.9 ng/mL
FOLATE, RBC: 800 ng/mL (ref >498)
Hematocrit: 44.5 % (ref 37.5–51.0)

## 2015-12-16 MED ORDER — CYANOCOBALAMIN 1000 MCG/ML IJ SOLN: 100.0000 ug | Freq: Every day | INTRAMUSCULAR | 0 refills | Status: DC

## 2015-12-16 MED ORDER — ASPIRIN 81 MG PO TBEC: 81.0000 mg | DELAYED_RELEASE_TABLET | Freq: Every day | ORAL | 0 refills | Status: AC

## 2015-12-16 MED ORDER — METOPROLOL TARTRATE 25 MG PO TABS: 12.5000 mg | ORAL_TABLET | Freq: Two times a day (BID) | ORAL | 0 refills | Status: DC

## 2015-12-16 MED ORDER — CARBIDOPA-LEVODOPA ER 25-100 MG PO TBCR: 1.0000 | EXTENDED_RELEASE_TABLET | Freq: Two times a day (BID) | ORAL | 0 refills | Status: DC

## 2015-12-16 MED ORDER — ALBUTEROL SULFATE HFA 108 (90 BASE) MCG/ACT IN AERS: 2.0000 | INHALATION_SPRAY | Freq: Four times a day (QID) | RESPIRATORY_TRACT | 2 refills | Status: DC | PRN

## 2015-12-16 NOTE — Consult Note (Signed)
   [facility] CM Inpatient Consult   12/16/2015  Linnell Luedeman July 10, 1940 [id number]  Patient evaluated for community based chronic disease management services with [place] [facility] as a benefit of patient's [employer] Medicare Insurance. Patient is a75 y.o.malewith medical history significant for hypertension, hyperlipidemia, COPD.  Admitted for chest pain.  Patient has home 02. Spoke with patient and wife Katharine Look, at bedside to explain [place] [facility] services. Chart review reveal the patient and wife were refusing home health services.  Explained services for no cost nursing follow up.  Consent form signed.     Patient will receive post hospital discharge call and will be evaluated for monthly home visits for assessments and disease process education.  Left contact information and [facility] literature at bedside. Of note, [facility] Care Management services does not replace or interfere with any services that are arranged by inpatient case management or social work.  For additional questions or referrals please contact:    Natividad Brood, RN BSN [facility] Liaison  [phone] business mobile phone Toll free office [phone]

## 2015-12-16 NOTE — Discharge Instructions (Signed)
Nonspecific Chest Pain It is often hard to find the cause of chest pain. There is always a chance that your pain could be related to something serious, such as a heart attack or a blood clot in your lungs. Chest pain can also be caused by conditions that are not life-threatening. If you have chest pain, it is very important to follow up with your doctor.  HOME CARE  If you were prescribed an antibiotic medicine, finish it all even if you start to feel better.  Avoid any activities that cause chest pain.  Do not use any tobacco products, including cigarettes, chewing tobacco, or electronic cigarettes. If you need help quitting, ask your doctor.  Do not drink alcohol.  Take medicines only as told by your doctor.  Keep all follow-up visits as told by your doctor. This is important. This includes any further testing if your chest pain does not go away.  Your doctor may tell you to keep your head raised (elevated) while you sleep.  Make lifestyle changes as told by your doctor. These may include:  Getting regular exercise. Ask your doctor to suggest some activities that are safe for you.  Eating a heart-healthy diet. Your doctor or a diet specialist (dietitian) can help you to learn healthy eating options.  Maintaining a healthy weight.  Managing diabetes, if necessary.  Reducing stress. GET HELP IF:  Your chest pain does not go away, even after treatment.  You have a rash with blisters on your chest.  You have a fever. GET HELP RIGHT AWAY IF:  Your chest pain is worse.  You have an increasing cough, or you cough up blood.  You have severe belly (abdominal) pain.  You feel extremely weak.  You pass out (faint).  You have chills.  You have sudden, unexplained chest discomfort.  You have sudden, unexplained discomfort in your arms, back, neck, or jaw.  You have shortness of breath at any time.  You suddenly start to sweat, or your skin gets clammy.  You feel  nauseous.  You vomit.  You suddenly feel light-headed or dizzy.  Your heart begins to beat quickly, or it feels like it is skipping beats. These symptoms may be an emergency. Do not wait to see if the symptoms will go away. Get medical help right away. Call your local emergency services (911 in the [place] Do not drive yourself to the hospital.   This information is not intended to replace advice given to you by your health care provider. Make sure you discuss any questions you have with your health care provider.   Document Released: 09/15/2007 Document Revised: 04/19/2014 Document Reviewed: 11/02/2013 Elsevier Interactive Patient Education 2016 [employer]   Heart-Healthy Eating Plan Many factors influence your heart health, including eating and exercise habits. Heart (coronary) risk increases with abnormal blood fat (lipid) levels. Heart-healthy meal planning includes limiting unhealthy fats, increasing healthy fats, and making other small dietary changes. This includes maintaining a healthy body weight to help keep lipid levels within a normal range. WHAT IS MY PLAN?  Your health care provider recommends that you:  Get no more than _________% of the total calories in your daily diet from fat.  Limit your intake of saturated fat to less than _________% of your total calories each day.  Limit the amount of cholesterol in your diet to less than _________ mg per day. WHAT TYPES OF FAT SHOULD I CHOOSE?  Choose healthy fats more often. Choose monounsaturated and polyunsaturated fats,  such as olive oil and canola oil, flaxseeds, walnuts, almonds, and seeds.  Eat more omega-3 fats. Good choices include salmon, mackerel, sardines, tuna, flaxseed oil, and ground flaxseeds. Aim to eat fish at least two times each week.  Limit saturated fats. Saturated fats are primarily found in animal products, such as meats, butter, and cream. Plant sources of saturated fats include palm oil, palm kernel  oil, and coconut oil.  Avoid foods with partially hydrogenated oils in them. These contain trans fats. Examples of foods that contain trans fats are stick margarine, some tub margarines, cookies, crackers, and other baked goods. WHAT GENERAL GUIDELINES DO I NEED TO FOLLOW?  Check food labels carefully to identify foods with trans fats or high amounts of saturated fat.  Fill one half of your plate with vegetables and green salads. Eat 4-5 servings of vegetables per day. A serving of vegetables equals 1 cup of raw leafy vegetables,  cup of raw or cooked cut-up vegetables, or  cup of vegetable juice.  Fill one fourth of your plate with whole grains. Look for the word "whole" as the first word in the ingredient list.  Fill one fourth of your plate with lean protein foods.  Eat 4-5 servings of fruit per day. A serving of fruit equals one medium whole fruit,  cup of dried fruit,  cup of fresh, frozen, or canned fruit, or  cup of 100% fruit juice.  Eat more foods that contain soluble fiber. Examples of foods that contain this type of fiber are apples, broccoli, carrots, beans, peas, and barley. Aim to get 20-30 g of fiber per day.  Eat more home-cooked food and less restaurant, buffet, and fast food.  Limit or avoid alcohol.  Limit foods that are high in starch and sugar.  Avoid fried foods.  Cook foods by using methods other than frying. Baking, boiling, grilling, and broiling are all great options. Other fat-reducing suggestions include:  Removing the skin from poultry.  Removing all visible fats from meats.  Skimming the fat off of stews, soups, and gravies before serving them.  Steaming vegetables in water or broth.  Lose weight if you are overweight. Losing just 5-10% of your initial body weight can help your overall health and prevent diseases such as diabetes and heart disease.  Increase your consumption of nuts, legumes, and seeds to 4-5 servings per week. One serving of  dried beans or legumes equals  cup after being cooked, one serving of nuts equals 1 ounces, and one serving of seeds equals  ounce or 1 tablespoon.  You may need to monitor your salt (sodium) intake, especially if you have high blood pressure. Talk with your health care provider or dietitian to get more information about reducing sodium. WHAT FOODS CAN I EAT? Grains Breads, including [place], white, pita, wheat, raisin, rye, oatmeal, and [place]. Tortillas that are neither fried nor made with lard or trans fat. Low-fat rolls, including hotdog and hamburger buns and English muffins. Biscuits. Muffins. Waffles. Pancakes. Light popcorn. Whole-grain cereals. Flatbread. Melba toast. Pretzels. Breadsticks. Rusks. Low-fat snacks and crackers, including oyster, saltine, matzo, graham, animal, and rye. Rice and pasta, including brown rice and those that are made with whole wheat. Vegetables All vegetables. Fruits All fruits, but limit coconut. Meats and Other Protein Sources Lean, well-trimmed beef, veal, pork, and lamb. Chicken and [place] without skin. All fish and shellfish. Wild duck, rabbit, pheasant, and venison. Egg whites or low-cholesterol egg substitutes. Dried beans, peas, lentils, and tofu.Seeds and most  nuts. Dairy Low-fat or nonfat cheeses, including ricotta, string, and mozzarella. Skim or 1% milk that is liquid, powdered, or evaporated. Buttermilk that is made with low-fat milk. Nonfat or low-fat yogurt. Beverages Mineral water. Diet carbonated beverages. Sweets and Desserts Sherbets and fruit ices. Honey, jam, marmalade, jelly, and syrups. Meringues and gelatins. Pure sugar candy, such as hard candy, jelly beans, gumdrops, mints, marshmallows, and small amounts of dark chocolate. [employer] Eat all sweets and desserts in moderation. Fats and Oils Nonhydrogenated (trans-free) margarines. Vegetable oils, including soybean, sesame, sunflower, olive, peanut, safflower, corn, canola,  and cottonseed. Salad dressings or mayonnaise that are made with a vegetable oil. Limit added fats and oils that you use for cooking, baking, salads, and as spreads. Other Cocoa powder. Coffee and tea. All seasonings and condiments. The items listed above may not be a complete list of recommended foods or beverages. Contact your dietitian for more options. WHAT FOODS ARE NOT RECOMMENDED? Grains Breads that are made with saturated or trans fats, oils, or whole milk. Croissants. Butter rolls. Cheese breads. Sweet rolls. Donuts. Buttered popcorn. Chow mein noodles. High-fat crackers, such as cheese or butter crackers. Meats and Other Protein Sources Fatty meats, such as hotdogs, short ribs, sausage, spareribs, bacon, ribeye roast or steak, and mutton. High-fat deli meats, such as salami and bologna. Caviar. Domestic duck and goose. Organ meats, such as kidney, liver, sweetbreads, brains, gizzard, chitterlings, and heart. Dairy Cream, sour cream, cream cheese, and creamed cottage cheese. Whole milk cheeses, including blue (bleu), Monterey Jack, [place], [place], American, [place], Swiss, [place], [place], and Oriental. Whole or 2% milk that is liquid, evaporated, or condensed. Whole buttermilk. Cream sauce or high-fat cheese sauce. Yogurt that is made from whole milk. Beverages Regular sodas and drinks with added sugar. Sweets and Desserts Frosting. Pudding. Cookies. Cakes other than angel food cake. Candy that has milk chocolate or white chocolate, hydrogenated fat, butter, coconut, or unknown ingredients. Buttered syrups. Full-fat ice cream or ice cream drinks. Fats and Oils Gravy that has suet, meat fat, or shortening. Cocoa butter, hydrogenated oils, palm oil, coconut oil, palm kernel oil. These can often be found in baked products, candy, fried foods, nondairy creamers, and whipped toppings. Solid fats and shortenings, including bacon fat, salt pork, lard, and butter. Nondairy cream substitutes, such  as coffee creamers and sour cream substitutes. Salad dressings that are made of unknown oils, cheese, or sour cream. The items listed above may not be a complete list of foods and beverages to avoid. Contact your dietitian for more information.   This information is not intended to replace advice given to you by your health care provider. Make sure you discuss any questions you have with your health care provider.   Document Released: 01/06/2008 Document Revised: 04/19/2014 Document Reviewed: 09/20/2013 Elsevier Interactive Patient Education [employer].

## 2015-12-16 NOTE — Progress Notes (Signed)
Subjective: No CP  Breathign OK  NO dizziness   Objective: Vitals:   12/15/15 2100 12/16/15 0400 12/16/15 0824 12/16/15 0841  BP: (!) 161/83 132/77  (!) 144/70  Pulse: 89 78  90  Resp: (!) 25 (!) 24    Temp: 98.1 F (36.7 C) 98 F (36.7 C)    TempSrc:      SpO2: 92% 98% 98%   Weight:  150 lb 14.4 oz (68.4 kg)    Height:       Weight change: -1 lb (-0.454 kg)  Intake/Output Summary (Last 24 hours) at 12/16/15 1034 Last data filed at 12/16/15 0500  Gross per 24 hour  Intake              600 ml  Output              350 ml  Net              250 ml    General: Alert, awake, oriented x3, in no acute distress Neck:  JVP is normal Heart: Regular rate and rhythm, without murmurs, rubs, gallops.  Lungs: Clear to auscultation.  No rales or wheezes. Exemities:  No edema.   Neuro: Grossly intact, nonfocal.   Lab Results: No results found for this or any previous visit (from the past 24 hour(s)).  Studies/Results: Ct Head Wo Contrast  Result Date: 12/15/2015 CLINICAL DATA:  Possible seizure today. The patient is unresponsive for 10 seconds. EXAM: CT HEAD WITHOUT CONTRAST TECHNIQUE: Contiguous axial images were obtained from the base of the skull through the vertex without intravenous contrast. COMPARISON:  Head CT scan 05/13/2014. FINDINGS: Brain: Cortical atrophy and chronic microvascular ischemic change are seen. No evidence of acute abnormality including hemorrhage, infarct, mass lesion, mass effect, midline shift or abnormal extra-axial fluid collection. No hydrocephalus or pneumocephalus. Vascular: Atherosclerotic vascular disease is noted. Skull: Intact. Sinuses/Orbits: Mild ethmoid air cell disease on the right is seen. Other: None. IMPRESSION: No acute abnormality. Atrophy and chronic microvascular ischemic change. Atherosclerosis. Mild ethmoid air cell disease on the right. Electronically Signed   By: Inge Rise M.D.   On: 12/15/2015 14:30   Nm Myocar Multi W/spect  W/wall Motion / Ef  Result Date: 12/15/2015 CLINICAL DATA:  75 year old male with chest pain. EXAM: MYOCARDIAL IMAGING WITH SPECT (REST AND PHARMACOLOGIC-STRESS) GATED LEFT VENTRICULAR WALL MOTION STUDY LEFT VENTRICULAR EJECTION FRACTION TECHNIQUE: Standard myocardial SPECT imaging was performed after resting intravenous injection of 10 mCi Tc-29m tetrofosmin. Subsequently, intravenous infusion of Lexiscan was performed under the supervision of the Cardiology staff. At peak effect of the drug, 30 mCi Tc-71m tetrofosmin was injected intravenously and standard myocardial SPECT imaging was performed. Quantitative gated imaging was also performed to evaluate left ventricular wall motion, and estimate left ventricular ejection fraction. COMPARISON:  None. FINDINGS: Perfusion: There is no evidence of reversible ischemia. A large fixed defect in the inferior and septal walls noted. Wall Motion: Severe septal hypokinesis identified. Left Ventricular Ejection Fraction: 50 % End diastolic volume 73 ml End systolic volume 36 ml IMPRESSION: 1. No evidence of reversible ischemia. Large fixed defect/ scar in the inferior and septal walls. 2. Severe septal hypokinesis. 3. Left ventricular ejection fraction 50% 4. Non invasive risk stratification*: Intermediate *2012 Appropriate Use Criteria for Coronary Revascularization Focused Update: J Am Coll Cardiol. [id number]. [email or web address] Electronically Signed   By: Margarette Canada M.D.   On: 12/15/2015 14:30    Medications: REviewed    @PROBHOSP @  1  CP  Currently symptom free  Myovue yesterday showed inferior defect with LVEF mildy depressed.  Echo though showed normal wall motion, normal LVEF   ? If inferior changes artifact (soft tissue attenuation)  IMportantly there was no evid of ischemia I am not convinced CP is cardiac in origin  OK to d/c from cardiac [facility]   Keep on current medicsins  2.  Neuro  Pt with hx  multisystem atrophy  Seen by neuro. Today BP is OK, actually little high  Pt asymtpomatic  Besides tremor.     LOS: 0 days   Dorris Carnes 12/16/2015, 10:34 AM

## 2015-12-16 NOTE — Discharge Summary (Signed)
Physician Discharge Summary  [name] [id number] DOB: 11-Mar-1941 DOA: 12/14/2015  PCP: Jay Flow, NP  Admit date: 12/14/2015 Discharge date: 12/16/2015  Admitted From: Home  Disposition:  Home   Recommendations for Outpatient Follow-up:  1. Follow up with PCP in 1-2 weeks 2. Please obtain BMP/CBC in one week 3. Needs further treatment for B 12 deficiency   Home Health: Yes   Discharge Condition: stable.  CODE STATUS: DNR Diet recommendation: Heart Healthy   Brief/Interim Summary: Brief Narrative: [name] a pleasant but anxious 75 y.o.malewith medical history significant for hypertension, hyperlipidemia, COPD since to the emergency Department chief complaint chest pain. Patient being admitted rule out.  Information is obtained from the patient and his wife who is at the bedside. Patient reports he's been "confused" for over a year. Wife reports this is no worse than usual. She states he's been coughing for the last 3-4 days. He reports the cough is nonproductive This morning during a coughing spell he developed left anterior chest pain that radiated to his left arm. Associated symptoms include lightheadedness but no syncope or near-syncope. Denies palpitation lower extremity edema or orthopnea. Denies worsening shortness of breath. Of note he has been prescribed an inhaler but doesn't use it.   EMS gave him nitroglycerin 2 and aspirin. The point of admission he is pain-free    Assessment & Plan:  1. Chest pain. Some typical and atypical features. Heart score is 6. Initial troponin is negative. EKG as noted above. Chest x-ray with no active disease. Rest factors include hypertension, hyperlipidemia, CAD.  -troponin negative.  -stress test intermediate risk, no reversible ischemia, ECHO normal EF. Plan to discharge patient today.   2. Syncope, hypoperfusion  Currently not on any medications. Chart review indicates evaluated by neurology  2. His EEG an ambulatory EEG were negative. Denies any seizure activity in the last year -Called by cardiology PA, prior to [place] patient had SVT episode 160 which resolved. Myoview completed, while  patient was transitioning from bed to chair he develops what appears to be seizure episode, he became unresponsive, generalized stiffness and arms shaking. Patient back to baseline.  -I have order Ativan PRN for seizure.  -CT head. Negative.  -neurology think patient symptoms was related to hypoperfusion , hypotension. No evidence of seizure.     Tachycardia on presentation. Resolved on admission. EKG as noted above. -Obtain a TSH -metoprolol -had an episode of SVT prior to stress test   3. COPD. Appears stable at baseline. Respiratory effort somewhat increased but wife reports this is his baseline. Chest x-ray as noted above. He is not hypoxic. Reports he was on inhaler in the past but stopped taking this. Chart review indicates former smoker with shortness of breath at rest. CT and chest x-ray from 2016 with emphysema. D-dimer negative BNP within the limits of normal -Supplemental oxygen as indicated -Wean as able -Albuterol inhaler -Advair -Scheduled meds  4. Hypertension.  -permissive HTN due to orthostatics    5. Hyperlipidemia. Chart review indicates patient once managed with pravastatin  -Obtain a lipid panel -pravastatin  6. Parkinson's , multiple system atrophy. Once managed with benztropine And carbidopa-levadopa is not taken in over a year. He states he did not think helped -improved on carbidopa, prescription provided. He is willing to take meds.  -Physical therapy consult  7. Depression/anxiety.   8. Dementia.  - B-12  Low, will start supplements. B 12 injection for 6 days. He will need further supplements.  - RPR;  non reactive -outpatient follow up with neuro  10. Dysphagia. Chart review indicates he had a barium swallow in 2016 that demonstrated a  moderate oral phase and mild cervical phase dysphasia. Dysphasia 3 mechanical soft diet with thin liquids was recommended. Wife reports he is not following this   Discharge Diagnoses:  Principal Problem:   Chest pain at rest Active Problems:   Skin lesion   Essential hypertension   Hyperlipidemia   Essential tremor   COPD (chronic obstructive pulmonary disease) (HCC)   Heart disease   Encephalopathy chronic   Dysphagia    Discharge Instructions  Discharge Instructions    Diet - low sodium heart healthy    Complete by:  As directed   Increase activity slowly    Complete by:  As directed       Medication List    STOP taking these medications   mupirocin cream 2 % Commonly known as:  BACTROBAN     TAKE these medications   albuterol 108 (90 Base) MCG/ACT inhaler Commonly known as:  PROVENTIL HFA;VENTOLIN HFA Inhale 2 puffs into the lungs every 6 (six) hours as needed for wheezing or shortness of breath.   aspirin 81 MG EC tablet Take 1 tablet (81 mg total) by mouth daily.   Carbidopa-Levodopa ER 25-100 MG tablet controlled release Commonly known as:  SINEMET CR Take 1 tablet by mouth 2 (two) times daily.   cyanocobalamin 1000 MCG/ML injection Commonly known as:  (VITAMIN B-12) Inject 0.1 mLs (100 mcg total) into the muscle daily.   Fluticasone-Salmeterol 250-50 MCG/DOSE Aepb Commonly known as:  ADVAIR DISKUS Inhale 1 puff into the lungs every 12 (twelve) hours.   metoprolol tartrate 25 MG tablet Commonly known as:  LOPRESSOR Take 0.5 tablets (12.5 mg total) by mouth 2 (two) times daily.       Allergies  Allergen Reactions  . Levaquin [Levofloxacin In D5w] Nausea And Vomiting  . Penicillins Hives    Consultations: Cardiology  Neurology   Procedures/Studies: Ct Head Wo Contrast  Result Date: 12/15/2015 CLINICAL DATA:  Possible seizure today. The patient is unresponsive for 10 seconds. EXAM: CT HEAD WITHOUT CONTRAST TECHNIQUE: Contiguous axial images  were obtained from the base of the skull through the vertex without intravenous contrast. COMPARISON:  Head CT scan 05/13/2014. FINDINGS: Brain: Cortical atrophy and chronic microvascular ischemic change are seen. No evidence of acute abnormality including hemorrhage, infarct, mass lesion, mass effect, midline shift or abnormal extra-axial fluid collection. No hydrocephalus or pneumocephalus. Vascular: Atherosclerotic vascular disease is noted. Skull: Intact. Sinuses/Orbits: Mild ethmoid air cell disease on the right is seen. Other: None. IMPRESSION: No acute abnormality. Atrophy and chronic microvascular ischemic change. Atherosclerosis. Mild ethmoid air cell disease on the right. Electronically Signed   By: Inge Rise M.D.   On: 12/15/2015 14:30   Nm Myocar Multi W/spect W/wall Motion / Ef  Result Date: 12/15/2015 CLINICAL DATA:  75 year old male with chest pain. EXAM: MYOCARDIAL IMAGING WITH SPECT (REST AND PHARMACOLOGIC-STRESS) GATED LEFT VENTRICULAR WALL MOTION STUDY LEFT VENTRICULAR EJECTION FRACTION TECHNIQUE: Standard myocardial SPECT imaging was performed after resting intravenous injection of 10 mCi Tc-6m tetrofosmin. Subsequently, intravenous infusion of Lexiscan was performed under the supervision of the Cardiology staff. At peak effect of the drug, 30 mCi Tc-40m tetrofosmin was injected intravenously and standard myocardial SPECT imaging was performed. Quantitative gated imaging was also performed to evaluate left ventricular wall motion, and estimate left ventricular ejection fraction. COMPARISON:  None. FINDINGS: Perfusion: There is no evidence of  reversible ischemia. A large fixed defect in the inferior and septal walls noted. Wall Motion: Severe septal hypokinesis identified. Left Ventricular Ejection Fraction: 50 % End diastolic volume 73 ml End systolic volume 36 ml IMPRESSION: 1. No evidence of reversible ischemia. Large fixed defect/ scar in the inferior and septal walls. 2. Severe  septal hypokinesis. 3. Left ventricular ejection fraction 50% 4. Non invasive risk stratification*: Intermediate *2012 Appropriate Use Criteria for Coronary Revascularization Focused Update: J Am Coll Cardiol. [id number]. http://content.[email or web address].aspx?[email or web address] Electronically Signed   By: Margarette Canada M.D.   On: 12/15/2015 14:30   Dg Chest Port 1 View  Result Date: 12/14/2015 CLINICAL DATA:  Left-sided chest pain. Nonproductive cough, and shortness of breath for several weeks. EXAM: PORTABLE CHEST 1 VIEW COMPARISON:  02/17/2015 FINDINGS: The heart size and mediastinal contours are within normal limits. Both lungs are clear. No evidence of pneumothorax or pleural effusion. The visualized skeletal structures are unremarkable. IMPRESSION: No active disease. Electronically Signed   By: Earle Gell M.D.   On: 12/14/2015 11:29     Subjective: He is feeling better, has less tremors.  Denies chest pain   Discharge Exam: Vitals:   12/16/15 0400 12/16/15 0841  BP: 132/77 (!) 144/70  Pulse: 78 90  Resp: (!) 24   Temp: 98 F (36.7 C)    Vitals:   12/15/15 2100 12/16/15 0400 12/16/15 0824 12/16/15 0841  BP: (!) 161/83 132/77  (!) 144/70  Pulse: 89 78  90  Resp: (!) 25 (!) 24    Temp: 98.1 F (36.7 C) 98 F (36.7 C)    TempSrc:      SpO2: 92% 98% 98%   Weight:  68.4 kg (150 lb 14.4 oz)    Height:        General: Pt is alert, awake, not in acute distress Cardiovascular: RRR, S1/S2 +, no rubs, no gallops Respiratory: CTA bilaterally, no wheezing, no rhonchi Abdominal: Soft, NT, ND, bowel sounds + Extremities: no edema, no cyanosis    The results of significant diagnostics from this hospitalization (including imaging, microbiology, ancillary and laboratory) are listed below for reference.     Microbiology: No results found for this or any previous visit (from the past 240 hour(s)).   Labs: BNP (last 3 results)  Recent Labs  12/14/15 1105  BNP [id number]    Basic Metabolic Panel:  Recent Labs Lab 12/14/15 1105 12/14/15 1423  NA 136  --   K 3.7  --   CL 102  --   CO2 27  --   GLUCOSE 117*  --   BUN 11  --   CREATININE 0.76 0.80  CALCIUM 8.7*  --    Liver Function Tests: No results for input(s): AST, ALT, ALKPHOS, BILITOT, PROT, ALBUMIN in the last 168 hours. No results for input(s): LIPASE, AMYLASE in the last 168 hours. No results for input(s): AMMONIA in the last 168 hours. CBC:  Recent Labs Lab 12/14/15 1105 12/14/15 1423  WBC 8.7  --   NEUTROABS 6.1  --   HGB 15.8  --   HCT 47.0 44.5  MCV 85.1  --   PLT 219  --    Cardiac Enzymes:  Recent Labs Lab 12/14/15 1423 12/14/15 1818 12/15/15 0301  TROPONINI 0.15* 0.27* 0.24*   BNP: Invalid input(s): POCBNP CBG: No results for input(s): GLUCAP in the last 168 hours. D-Dimer  Recent Labs  12/14/15 1105  DDIMER 0.34   Hgb A1c No results for input(s): HGBA1C in the last  72 hours. Lipid Profile  Recent Labs  12/14/15 1423  CHOL 197  HDL 32*  LDLCALC 130*  TRIG 174*  CHOLHDL 6.2   Thyroid function studies  Recent Labs  12/14/15 1423  TSH 0.843   Anemia work up  Recent Labs  12/14/15 1423  VITAMINB12 118*   Urinalysis    Component Value Date/Time   COLORURINE AMBER (A) 12/14/2015 1957   APPEARANCEUR HAZY (A) 12/14/2015 1957   APPEARANCEUR Clear 05/13/2014 1251   LABSPEC 1.031 (H) 12/14/2015 1957   LABSPEC 1.013 05/13/2014 1251   PHURINE 5.5 12/14/2015 1957   GLUCOSEU 250 (A) 12/14/2015 1957   GLUCOSEU Negative 05/13/2014 1251   HGBUR NEGATIVE 12/14/2015 1957   BILIRUBINUR SMALL (A) 12/14/2015 1957   BILIRUBINUR Negative 05/13/2014 1251   KETONESUR NEGATIVE 12/14/2015 1957   PROTEINUR NEGATIVE 12/14/2015 1957   NITRITE NEGATIVE 12/14/2015 1957   LEUKOCYTESUR NEGATIVE 12/14/2015 1957   LEUKOCYTESUR Negative 05/13/2014 1251   Sepsis Labs Invalid input(s): PROCALCITONIN,  WBC,  LACTICIDVEN Microbiology No results found for this or  any previous visit (from the past 240 hour(s)).   Time coordinating discharge: Over 30 minutes  SIGNED:   Elmarie Shiley, MD  [facility] 12/16/2015, 3:49 PM Pager [phone]  If 7PM-7AM, please contact night-coverage [email or web address] Password [email or web address]

## 2015-12-16 NOTE — Progress Notes (Signed)
Physical Therapy Treatment Patient Details Name: [name] MRN: [id number] DOB: 1941/03/31 Today's Date: 12/16/2015    History of Present Illness Patient is a 75 yo male admitted 12/14/15 with chest pain and confusion.    PMH:  Anxiety, depression, HTN, HLD, COPD, Tremors (Parkinson's ruled out), seizure, chronic encephalopathy, dysphagia    PT Comments    Patient seen for mobility progression. Patient ambulating well with use of single point cane, no physical assist required throughout session. Patient performed am self care functional tasks and hygiene during session with modest instability and no need for physical assist. Patient denies dizziness or chest pain at this time, VSS on room air with HR in the 90s. Anticipate patient will be safe for d/c home. Will follow.  Follow Up Recommendations  Home health PT;Supervision for mobility/OOB     Equipment Recommendations  None recommended by PT    Recommendations for Other Services       Precautions / Restrictions Precautions Precautions: Fall Precaution Comments: Constant tremors Restrictions Weight Bearing Restrictions: No    Mobility  Bed Mobility Overal bed mobility: Modified Independent             General bed mobility comments: use of rail and increased time  Transfers Overall transfer level: Needs assistance Equipment used: Straight cane Transfers: Sit to/from Stand Sit to Stand: Supervision         General transfer comment: cues for hand placement and safety  Ambulation/Gait Ambulation/Gait assistance: Supervision Ambulation Distance (Feet): 180 Feet Assistive device: Straight cane Gait Pattern/deviations: Step-through pattern;Decreased stride length;Trunk flexed   Gait velocity interpretation: Below normal speed for age/gender General Gait Details: guarding for safety, pt with good use of cane, maintains flexed posture despite cues and tremor throughout   Stairs             Wheelchair Mobility    Modified Rankin (Stroke Patients Only)       Balance Overall balance assessment: Needs assistance Sitting-balance support: No upper extremity supported Sitting balance-Leahy Scale: Fair       Standing balance-Leahy Scale: Fair                      Cognition Arousal/Alertness: Awake/alert Behavior During Therapy: Flat affect Overall Cognitive Status: History of cognitive impairments - at baseline       Memory: Decreased short-term memory              Exercises      General Comments General comments (skin integrity, edema, etc.): performed standing functional tasks in room, also performed transfers from toilet with supervision for self care, no physical assist required. Patient also educated on activity expectations and safety for discharge home.      Pertinent Vitals/Pain Pain Assessment: No/denies pain    Home Living                      Prior Function            PT Goals (current goals can now be found in the care plan section) Acute Rehab PT Goals Patient Stated Goal: To go home PT Goal Formulation: With patient/family Time For Goal Achievement: 12/21/15 Potential to Achieve Goals: Good Progress towards PT goals: Progressing toward goals    Frequency  Min 3X/week    PT Plan Current plan remains appropriate    Co-evaluation             End of Session Equipment Utilized During Treatment: Gait belt  Activity Tolerance: Patient tolerated treatment well Patient left: in chair;with call bell/phone within reach;with chair alarm set;with family/visitor present     Time: [id number] PT Time Calculation (min) (ACUTE ONLY): 17 min  Charges:  $Gait Training: 8-22 mins                      Duncan Dull 12/16/2015, 9:56 AM Alben Deeds, PT DPT  [phone]

## 2015-12-18 ENCOUNTER — Encounter: Payer: Self-pay | Admitting: *Deleted

## 2015-12-18 ENCOUNTER — Telehealth: Payer: Self-pay

## 2015-12-18 ENCOUNTER — Other Ambulatory Visit: Payer: Self-pay | Admitting: *Deleted

## 2015-12-18 NOTE — Telephone Encounter (Signed)
Transition Care Management Follow-up Telephone Call    Date discharged? 12/18/2015  How have you been since you were released from the hospital? [place].   Any patient concerns? Pt is still experiencing chest pain. Pain scale: 5-8/10. Frequency: intermittent. Location: left side of chest near "nipple" area. Intensity: Sharp. Pain worsens when lying down or when walking a lot. Denies SOB, pain radiating down arm or up neck, feeling cool or clammy. Verbalizes feeling nauseated.   Pt is unable to obtain Sinemet CR at this time. Pharmacy of choice is out of stock and pt does not have money for med co-pay.    Items Reviewed:  Medications reviewed: Yes  Allergies reviewed: Yes  Dietary changes reviewed: N/A  Referrals reviewed: Yes   Functional Questionnaire:  Independent - I Dependent - D    Activities of Daily Living (ADLs):    Personal hygiene - I Dressing - I Eating - I Maintaining continence - I Transferring - I (uses cane to assist with transfers and ambulation)  Independent Activities of Daily Living (iADLs): Basic communication skills - I Transportation - D (pt does not drive) Meal preparation  - D Shopping - D Housework - D  Managing medications - D Managing personal finances - D   Confirmed importance and date/time of follow-up visits scheduled YES  Provider Appointment booked with PCP 12/18/15 @ 0800  Confirmed with patient if condition begins to worsen call PCP or go to the ER.  Patient was given the office number and encouraged to call back with question or concerns: YES

## 2015-12-18 NOTE — Patient Outreach (Signed)
[facility]) Care Management [facility] CM Telephone Outreach, Transition of Care Attempt #1 12/18/2015  Anass Carnegie Jul 01, 1940 [id number]  Unsuccessful telephone outreach to Leane Para, 75 y/o male referred to [place] for transition of care after recent hospitalization September 3-5, 2017 for chest pain, confusion, cough and near syncope.  Patient was noted to have SVT during hospital visit, and has a history of HTN, hyperlipedemia, COPD, and Parkinson's disease. HIPAA compliant VM msg left for patient, asking him to return my call.  Plan:  Will re-attempt telephone outreach for transition of care tomorrow if I do not hear back from patient first  Oneta Rack, RN, BSN, [employer] Coordinator [facility] Care Management  [phone]

## 2015-12-18 NOTE — Telephone Encounter (Signed)
noted 

## 2015-12-19 ENCOUNTER — Encounter: Payer: Self-pay | Admitting: Family Medicine

## 2015-12-19 ENCOUNTER — Other Ambulatory Visit: Payer: Self-pay | Admitting: *Deleted

## 2015-12-19 ENCOUNTER — Ambulatory Visit (INDEPENDENT_AMBULATORY_CARE_PROVIDER_SITE_OTHER): Payer: PPO | Admitting: Family Medicine

## 2015-12-19 VITALS — BP 126/88 | HR 72 | Temp 98.2°F | Ht 69.0 in | Wt 152.8 lb

## 2015-12-19 DIAGNOSIS — E538 Deficiency of other specified B group vitamins: Secondary | ICD-10-CM

## 2015-12-19 DIAGNOSIS — G232 Striatonigral degeneration: Secondary | ICD-10-CM | POA: Diagnosis not present

## 2015-12-19 DIAGNOSIS — G250 Essential tremor: Secondary | ICD-10-CM

## 2015-12-19 DIAGNOSIS — R739 Hyperglycemia, unspecified: Secondary | ICD-10-CM

## 2015-12-19 DIAGNOSIS — R0602 Shortness of breath: Secondary | ICD-10-CM

## 2015-12-19 MED ORDER — CYANOCOBALAMIN 1000 MCG/ML IJ SOLN
1000.0000 ug | Freq: Once | INTRAMUSCULAR | Status: AC
  Administered 2015-12-19: 1000 ug via INTRAMUSCULAR

## 2015-12-19 MED ORDER — CYANOCOBALAMIN 1000 MCG/ML IJ SOLN
INTRAMUSCULAR | 0 refills | Status: DC
Start: 2015-12-19 — End: 2016-01-18

## 2015-12-19 NOTE — Progress Notes (Addendum)
Subjective:    Patient ID: [name], male    DOB: Apr 06, 1941, 75 y.o.   MRN: [id number]  HPI This is a 75 yo male, accompanied by his wife, who presents today for hospital follow up. He was admitted 9/3-12/16/15 with chest pain. He had a stress test that showed intermediate risk with no reversible ischemia, normal EF on echo.   Chest pain- He continued to have chest pain through last night. It was 5-8/10, left sided and sharp. It was worse with lying down and with walking. He started his metoprolol yesterday and his wife reports that his chest pain has since resolved. He denies any chest pain or SOB this morning. His wife reports that there is a great deal of stress between the patient and his daughter.   Tremor/parkinson's variant- patient was prescribed Sinemet but his wife reports he has never started it. He continues to have severe, resting tremor. They agree to return to neuro.   Vitamin B12 deficiency- diagnosed in hospital and order given for replacement. Prescription sent to patient's pharmacy and wife has brought in with her today.   Elevated blood glucose- was elevated mildly in hospital at 117, will recheck in one week.    Past Medical History:  Diagnosis Date  . Chest pain   . COPD (chronic obstructive pulmonary disease) ([place])   . Depression   . Diabetes ([place])   . Dysphagia   . Essential tremor   . Heart disease   . Hyperlipidemia   . Hypertension   . Seizure disorder ([place])   . Tardive dyskinesia   . Vitamin D deficiency    Past Surgical History:  Procedure Laterality Date  . INGUINAL HERNIA REPAIR Left as child   Family History  Problem Relation Age of Onset  . Lung cancer Father   . CAD Brother     MI in his 40s    Social History  Substance Use Topics  . Smoking status: Former Smoker    Quit date: 09/04/2004  . Smokeless tobacco: Never Used  . Alcohol use No      Review of Systems Per HPI    Objective:   Physical Exam  Constitutional:  No distress.  Thin   HENT:  Head: Normocephalic and atraumatic.  Eyes: Conjunctivae are normal.  Cardiovascular: Normal rate, regular rhythm and normal heart sounds.   Pulmonary/Chest: Effort normal and breath sounds normal.  Musculoskeletal: He exhibits no edema.  Neurological: He is alert.  Appropriately conversive. Pronounced resting tremor of left hand.  Skin: Skin is warm and dry. He is not diaphoretic.  Psychiatric: He has a normal mood and affect. His behavior is normal.  Vitals reviewed.     BP 126/88   Pulse 72   Temp 98.2 F (36.8 C) (Oral)   Ht 5\' 9"  (1.753 m)   Wt 152 lb 12.8 oz (69.3 kg)   SpO2 96%   BMI 22.56 kg/m  Wt Readings from Last 3 Encounters:  12/19/15 152 lb 12.8 oz (69.3 kg)  12/16/15 150 lb 14.4 oz (68.4 kg)  08/14/15 158 lb 6.4 oz (71.8 kg)       Assessment & Plan:  1. Vitamin B12 deficiency - cyanocobalamin (,VITAMIN B-12,) 1000 MCG/ML injection; Once a week for 4 weeks, starting 12/19/15. Patient supplied first dose.  Dispense: 1 mL; Refill: 0 - will need either otc sublingual supplementation or monthly IM injections  2. SOB (shortness of breath) - patient at baseline today, he was instructed to  continue Advair and albuterol.  3. Essential tremor - according to patient's wife, he has not ever taken Sinemet. Will hold off on starting at this point and will defer to neurology - Ambulatory referral to Neurology  4. Hyperglycemia - elevated glucose in hospital  - Basic metabolic panel; Future  - follow up with PCP in 2 months  Clarene Reamer, FNP-BC   Primary Care at [facility], [employer]  12/19/2015 8:46 AM

## 2015-12-19 NOTE — Progress Notes (Signed)
Pre visit review using our clinic review tool, if applicable. No additional management support is needed unless otherwise documented below in the visit note. 

## 2015-12-19 NOTE — Patient Instructions (Addendum)
On your way out today please do the following- Schedule nurse visits of your vitamin B12 shots once a week for the next 3 weeks Schedule a lab visit on Friday, Sept. 15 to coordinate with your B12 shot that day Schedule a follow up visit with Anda Kraft for 2 months See Rosaria Ferries to schedule a neurology appointment

## 2015-12-19 NOTE — Patient Outreach (Signed)
[facility]) Care Management [place] Telephone Outreach, Transition of Care, Attempt #2 12/19/2015  [name] [birth year] [id number]  Unsuccessful telephone outreach to [name], 75 y/o male referred to [facility] for transition of care after recent hospitalization September 3-5, 2017 for chest pain, confusion, cough and near syncope.  Patient was noted to have SVT during hospital visit, and has a history of HTN, hyperlipedemia, COPD, and Parkinson's disease.   Today, I was unable to leave patient a voice mail message, as the automated outgoing voicemail stated that "the person you are trying to reach has a voicemail that has not been set up yet."  Plan:  Will make [facility] RN CM coverage partner aware of need for [facility] call attempt #3 next week, if I do not hear back from patient first   Oneta Rack, RN, BSN, [employer] Coordinator [facility] Care Management  [phone]

## 2015-12-22 NOTE — Progress Notes (Addendum)
[name] was seen today in the movement disorders clinic for neurologic consultation at the request of  Dr. Melrose Nakayama for a second opinion.  His primary care physician is Dr. Sheral Flow, NP.  The consultation is for the evaluation of tremor.  Notes from Dr. Melrose Nakayama were reviewed.  The patient's wife of 5 years also supplements the history.  Dr. Melrose Nakayama indicates that he felt that the patient's tremor is likely from chronic neuroleptic use in the past.  Notes from Dr. Melrose Nakayama indicates that the patient was on Seroquel, which appears that this was discontinued in early part of 2016.  His wife indicates that he was also on abilify and that was d/c in Jan 2016.  His wife doesn't remember the seroquel but that was what records indicate.  Wife states that it was abilify and he was on it x 1.5 years.  He was also on Depakote when Dr. Melrose Nakayama first began to see the patient, but is off of that now.  He is on Cogentin, 1 mg 3 times per day.  Pt states that it was started by Dr. Melrose Nakayama on 06/18/14 and pts wife states that no benefit.  Things may have gotten worse with it.  Wife states that tremor has been going on for 1.5 years.  It started with tremor in both hands.  Tremor has significantly picked up in both hands.  His wife states that he is now grunting and it happens all day long.  That has been going on for 3 months as has the head "bobbing."   They are unaware if neuroimaging has been performed.  He did have an EEG in January, 2015 that was reported to be normal.  His wife does mention a history of seizure.  She states that it started over 20 years ago and she really does not know the details of it.  She does note that he was in the hospital for a long time and when he came out he had to relearn to do everything.  She states that he had been seizure-free for well over a year until a few weeks ago when he was sitting in the chair and his eyes appeared to roll back and he slumped over for a  maximum of 3 seconds and then was completely normal again.  12/11/14 update:  The patient is following up today, accompanied by his wife who supplements the history.  Last visit, I took the patient off of Cogentin because of memory issues, gait changes and dry mouth.  We added levodopa.  This was done on 09/05/2014.  They called me on July 7 to state that the patient was more dizzy so we changed this from one tablet 3 times per day to half tablet 6 times per day.  His wife states that he ended up going to 1 tablet tid and they realized that the initial dizziness was from a bought of bronchitis.  His wife states that he has actually done much better with the medication.  Much less tremor.  No falls.  Much less agitation.  Asks if we can try and increase it again. He did have a routine EEG and an ambulatory EEG since our last visit as his wife had believed he had a reemergence of seizure.  He had a modified barium swallow done on 09/19/2014; this demonstrated a moderate oral phase and mild cervical phase dysphagia and a dysphagia 3 (mechanical soft) diet with thin liquids was recommended.  They have  been doing that.  He was supposed to have myobloc done today for sialorrhea but decided not to have that done today.    03/13/15 update:  The patient is following up today, accompanied by his wife who supplements the history.  Last visit, increase his carbidopa/levodopa 25/100, so that he was taking 1-1/2 tablets 3 times per day.  He admits that he isn't taking his medication at all, including his psychiatry meds (cymbalta).    I encouraged him to use walker at all times.  He hasn't done that.  In the past, he has refused formal therapy.  I have reviewed records since his last visit with me.  He went to the emergency room several times.  He went on September 12.  After mowing the lawn, he felt like he had swallowed something and had a foreign body sensation in the throat.  The workup was negative and he was released.  He  also went on September 25 with low back pain and November 7 with chest pain.  The workup was negative and he was not admitted for these.  He has had more trouble swallowing.  He admits that he eats whatever he wants and doesn't want to follow the speech therapy diet.  He does state that he wants to be DNR.  He had a fall since last visit.  He was outside looking for a kitten and he fell looking for it.    12/23/15 update:  The patient follows up today, accompanied by his wife who supplements the history.  I have not seen the patient in about 9 months.  He no showed his visit with me in March.  When I saw him last, he had discontinued his levodopa, and I had encouraged him to restart that.  He did not do that.  He had refused all physical therapy.  Wife states that he actually left my office last visit and d/c all his medications.  Records were reviewed since last visit.  He went to the emergency room on 05/03/2015 and was diagnosed with a renal stone entering the bladder.  He was admitted to the hospital on September 3 for chest pain.  Neurology saw him during that hospital stay.  Apparently, after he had a Myoview scan, the patient was transferring from a lying to a seated position and had a blank stare for 5-10 seconds.  Per some notes, it appears that the patient had stiffness and shaking, although neurology records did not note this.  Neurology felt likely orthostatic in nature.  Pt doesn't remember this.    The patient has had previous episodes that were similar and have previously been worked up.  He had a 24-hour ambulatory EEG in July, 2016 that was normal.  No falls.  He is having some trouble swallowing and some choking on foods.  Wife did state that when he was recently in the hospital, he was put on carbidopa/levodopa and he had no tremor.  I reviewed records and it appears that he was given carbidopa/levodopa 25/100 cr bid.  Wife states that they were given a RX but drug store told her that unable to  get that version of the medication.  Pt admits to some diplopia.  Pt not driving.    ALLERGIES:   Allergies  Allergen Reactions  . Levaquin [Levofloxacin In D5w] Nausea And Vomiting  . Penicillins Hives    CURRENT MEDICATIONS:  Outpatient Encounter Prescriptions as of 12/23/2015  Medication Sig  . albuterol (PROVENTIL HFA;VENTOLIN HFA)  108 (90 Base) MCG/ACT inhaler Inhale 2 puffs into the lungs every 6 (six) hours as needed for wheezing or shortness of breath.  Marland Kitchen aspirin EC 81 MG EC tablet Take 1 tablet (81 mg total) by mouth daily.  . cyanocobalamin (,VITAMIN B-12,) 1000 MCG/ML injection Once a week for 4 weeks, starting 12/19/15. Patient supplied first dose.  Marland Kitchen Fluticasone-Salmeterol (ADVAIR DISKUS) 250-50 MCG/DOSE AEPB Inhale 1 puff into the lungs every 12 (twelve) hours.  . carbidopa-levodopa (SINEMET IR) 25-100 MG tablet Take 1 tablet by mouth 3 (three) times daily.  Marland Kitchen levETIRAcetam (KEPPRA) 500 MG tablet Take 1 tablet (500 mg total) by mouth 2 (two) times daily.  . metoprolol tartrate (LOPRESSOR) 25 MG tablet Take 0.5 tablets (12.5 mg total) by mouth 2 (two) times daily. (Patient not taking: Reported on 12/23/2015)   No facility-administered encounter medications on file as of 12/23/2015.     PAST MEDICAL HISTORY:   Past Medical History:  Diagnosis Date  . Chest pain   . COPD (chronic obstructive pulmonary disease) ([place])   . Depression   . Diabetes ([place])   . Dysphagia   . Essential tremor   . Heart disease   . Hyperlipidemia   . Hypertension   . Seizure disorder ([place])   . Tardive dyskinesia   . Vitamin D deficiency     PAST SURGICAL HISTORY:   Past Surgical History:  Procedure Laterality Date  . INGUINAL HERNIA REPAIR Left as child    SOCIAL HISTORY:   Social History   Social History  . Marital status: Married    Spouse name: N/A  . Number of children: N/A  . Years of education: N/A   Occupational History  . Not on file.   Social History Main Topics  .  Smoking status: Former Smoker    Quit date: 09/04/2004  . Smokeless tobacco: Never Used  . Alcohol use No  . Drug use: No  . Sexual activity: Not on file   Other Topics Concern  . Not on file   Social History Narrative   Married.   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   Enjoys spending time with family.     FAMILY HISTORY:   Family Status  Relation Status  . Father Deceased   cancer, emphysema  . Mother Deceased   depression  . Brother Alive  . Sister Alive  . Brother Deceased    ROS:  A complete 10 system review of systems was obtained and was unremarkable apart from what is mentioned above.  PHYSICAL EXAMINATION:    VITALS:   Vitals:   12/23/15 1231  Weight: 153 lb (69.4 kg)  Height: 5\' 9"  (1.753 m)    GEN:  The patient appears stated age and is in NAD. HEENT:  Normocephalic, atraumatic.  The mucous membranes are very dry. The superficial temporal arteries are without ropiness or tenderness.  The tongue moves about in the mouth.  It does not come out of the mouth. CV:  RRR Lungs:  CTAB.  He has inspiratory gasps with vocal stridor. Neck/HEME:  There are no carotid bruits bilaterally.  The head/neck is held in a flexed manner with the chin close to the chest.  He has difficulty lifting the head to a neutral position.  Neurological examination:  Orientation: The patient is able to correctly identify the month, year and day of the week.   Cranial nerves: There is good facial symmetry.  There is significant facial hypomimia.   His speech is  fluent and clear.  He grunts throughout the entire examination.  He has downgaze paresis.  Soft palate rises symmetrically and there is no tongue deviation. Hearing is intact to conversational tone. Sensation: Sensation is intact to light touch throughout. Motor: Strength is at least antigravity 4.   Movement examination: Tone: There is mod increased tone in the bilateral UE (also has trouble relaxing for proper  examination) Abnormal movements: There is a near constant bilateral upper extremity resting tremor, moderate in nature.  It goes away when the patient uses his hands. Coordination:  There is mild decremation with RAM's, seen with hand opening and closing just slightly and finger taps. Gait and Station: The patient has difficulty getting out of the chair.  He is short stepped but no freezing.    Lab Results  Component Value Date   VITAMINB12 118 (L) 12/14/2015     ASSESSMENT/PLAN:  1.  Multiple system atrophy  -Long discussion with the patient and his wife today.  Much greater than 50% of the 40 minute visit in counseling.  Talked to them again about the fact that I think he has multiple system atrophy.  He has neck flexion, dysphagia, parkinsonism and possible orthostasis.    -He is supposed to be on carbidopa/levodopa 25/100, 1.5 tablets 3 times per day but he has been noncompliant with medication and has been off of that for about a year.  He was on it during his recent hospital stay (only carbidopa/levodopa 25/100 CR bid) and wife states that he was better.  Compliance with medication and certainly with follow-up visits has impeded proper care.  Unable to get carbidopa/levodopa 25/100 CR from their pharmacy and will restart carbidopa/levodopa 25/100 IR tid.  -Talked to him extensively about end-of-life issues.  Patient reported that he would like to be DO NOT RESUSCITATE.   -He does not want PT 2.  Dysphagia  -He had a modified barium swallow done on 09/19/2014; this demonstrated a moderate oral phase and mild cervical phase dysphagia and a dysphagia 3 (mechanical soft) diet with thin liquids was recommended.  He is not following this.  He and I discussed risks of aspiration pneumonia today.  He understands his increased risk of morbidity and mortality.  No real reason to repeat study if patient not going to follow recommendations, knows risk and is DNR 3.  Sialorrhea  -He did not do well  with cogentin.  He decided to hold on myobloc.   4.  Tardive dyskinesia  -The patient does have some of this in the tongue.  I suspect that this is, in fact, from long-standing use of anti-psychotic medication.  While tardive parkinsonism is rare, tardive dyskinesia is not rare.  He is not bothered by this. 5.  Pseudobulbar affect  -Better right now and decided to hold on further meds 6.  History of seizure  -His EEG and ambulatory EEG were negative.  He had an episode in the hospital in September, 2017 that I have reviewed and may have been convulsive syncope, but it may have been seizure as well.  I talked to them about potentially trying something such as Keppra.  He has been on medication in the past but discontinued it.  Compliance has been an issue amongst him regarding most of his medical care.  Pt willing to try the [place].  Risks, benefits, side effects and alternative therapies were discussed.  The opportunity to ask questions was given and they were answered to the best of my ability.  The patient expressed understanding and willingness to follow the outlined treatment protocols. 7.  Lightheadedness  -This certainly may represent orthostasis associated with MSA.  However, he was not orthostatic in the office today but when checking these, his pulse went from 90 in the lying position to 48 in the seated position to 98 standing.  Asked him to f/u with cardiology 8.  Dementia  -The patient does not participate completely with neurocognitive testing today.  9.  B12 deficiency  -just started on injections this week per wife  -no s/s to suggest subacute combined degeneration of cord. 10.  Follow-up with me will be in the next 5 weeks

## 2015-12-23 ENCOUNTER — Ambulatory Visit (INDEPENDENT_AMBULATORY_CARE_PROVIDER_SITE_OTHER): Payer: PPO | Admitting: Neurology

## 2015-12-23 ENCOUNTER — Encounter: Payer: Self-pay | Admitting: Neurology

## 2015-12-23 VITALS — Ht 69.0 in | Wt 153.0 lb

## 2015-12-23 DIAGNOSIS — Z515 Encounter for palliative care: Secondary | ICD-10-CM

## 2015-12-23 DIAGNOSIS — E538 Deficiency of other specified B group vitamins: Secondary | ICD-10-CM

## 2015-12-23 DIAGNOSIS — F458 Other somatoform disorders: Secondary | ICD-10-CM | POA: Diagnosis not present

## 2015-12-23 DIAGNOSIS — R1319 Other dysphagia: Secondary | ICD-10-CM

## 2015-12-23 DIAGNOSIS — G232 Striatonigral degeneration: Secondary | ICD-10-CM

## 2015-12-23 DIAGNOSIS — R569 Unspecified convulsions: Secondary | ICD-10-CM | POA: Diagnosis not present

## 2015-12-23 MED ORDER — CARBIDOPA-LEVODOPA 25-100 MG PO TABS: 1.0000 | ORAL_TABLET | Freq: Three times a day (TID) | ORAL | 2 refills | Status: DC

## 2015-12-23 MED ORDER — LEVETIRACETAM 500 MG PO TABS: 500.0000 mg | ORAL_TABLET | Freq: Two times a day (BID) | ORAL | 2 refills | Status: DC

## 2015-12-23 NOTE — Patient Instructions (Signed)
1. Start Keppra 500 mg 1 tablet twice daily.   2. In two weeks start Carbidopa Levodopa 1 tablet three times daily

## 2015-12-24 ENCOUNTER — Ambulatory Visit: Payer: Self-pay | Admitting: *Deleted

## 2015-12-24 ENCOUNTER — Other Ambulatory Visit: Payer: Self-pay | Admitting: *Deleted

## 2015-12-24 NOTE — Patient Outreach (Signed)
Unsuccessful outreach call to [name] for transition of care  as this RN CM covering for coworker Richarda Osmond (pt's primary community nurse case Freight forwarder).  Pt's recent hospitalization 9/3-12/16/2015 for chest pain, confusion, cough and near syncope.   This RN CM was informed  by coworker Richarda Osmond that 2 attempts were already made, no response.   Today, being the third attempt, this RN CM  unable to leave a voice message as message on phone- person you are trying to reach has a voice message that has not been set up yet.    Plan:  If do not hear back from pt, this RN CM will inform coworker Loss adjuster, chartered CM unable to contact.    Zara Chess.   [facility]  [phone]

## 2015-12-26 ENCOUNTER — Other Ambulatory Visit (INDEPENDENT_AMBULATORY_CARE_PROVIDER_SITE_OTHER): Payer: PPO

## 2015-12-26 ENCOUNTER — Ambulatory Visit (INDEPENDENT_AMBULATORY_CARE_PROVIDER_SITE_OTHER): Payer: PPO | Admitting: Primary Care

## 2015-12-26 DIAGNOSIS — G232 Striatonigral degeneration: Secondary | ICD-10-CM | POA: Diagnosis not present

## 2015-12-26 DIAGNOSIS — R739 Hyperglycemia, unspecified: Secondary | ICD-10-CM

## 2015-12-26 DIAGNOSIS — R0602 Shortness of breath: Secondary | ICD-10-CM

## 2015-12-26 DIAGNOSIS — E538 Deficiency of other specified B group vitamins: Secondary | ICD-10-CM

## 2015-12-26 LAB — BASIC METABOLIC PANEL
BUN: 10 mg/dL (ref 6–23)
CHLORIDE: 102 meq/L (ref 96–112)
CO2: 30 mEq/L (ref 19–32)
Calcium: 8.8 mg/dL (ref 8.4–10.5)
Creatinine, Ser: 0.77 mg/dL (ref 0.40–1.50)
GFR: 105.09 mL/min (ref >60.00)
Glucose, Bld: 93 mg/dL (ref 70–99)
POTASSIUM: 4 meq/L (ref 3.5–5.1)
Sodium: 140 mEq/L (ref 135–145)

## 2015-12-26 MED ORDER — CYANOCOBALAMIN 1000 MCG/ML IJ SOLN
1000.0000 ug | INTRAMUSCULAR | Status: AC
  Administered 2015-12-26 – 2016-01-09 (×3): 1000 ug via INTRAMUSCULAR

## 2015-12-26 NOTE — Progress Notes (Signed)
Weekly b12 inj x 4 weeks ordered by Tor Netters at 12/19/15 ov.

## 2015-12-29 ENCOUNTER — Encounter: Payer: Self-pay | Admitting: *Deleted

## 2016-01-02 ENCOUNTER — Ambulatory Visit (INDEPENDENT_AMBULATORY_CARE_PROVIDER_SITE_OTHER): Payer: PPO | Admitting: *Deleted

## 2016-01-02 DIAGNOSIS — E538 Deficiency of other specified B group vitamins: Secondary | ICD-10-CM | POA: Diagnosis not present

## 2016-01-02 NOTE — Progress Notes (Signed)
Weekly b12 inj x 4 weeks ordered by Tor Netters at 12/19/15 ov. 3rd inj given today.

## 2016-01-05 DIAGNOSIS — D0439 Carcinoma in situ of skin of other parts of face: Secondary | ICD-10-CM | POA: Diagnosis not present

## 2016-01-05 DIAGNOSIS — L821 Other seborrheic keratosis: Secondary | ICD-10-CM | POA: Diagnosis not present

## 2016-01-05 DIAGNOSIS — D485 Neoplasm of uncertain behavior of skin: Secondary | ICD-10-CM | POA: Diagnosis not present

## 2016-01-05 DIAGNOSIS — C44212 Basal cell carcinoma of skin of right ear and external auricular canal: Secondary | ICD-10-CM | POA: Diagnosis not present

## 2016-01-05 DIAGNOSIS — C44619 Basal cell carcinoma of skin of left upper limb, including shoulder: Secondary | ICD-10-CM | POA: Diagnosis not present

## 2016-01-05 DIAGNOSIS — C44219 Basal cell carcinoma of skin of left ear and external auricular canal: Secondary | ICD-10-CM | POA: Diagnosis not present

## 2016-01-09 ENCOUNTER — Ambulatory Visit (INDEPENDENT_AMBULATORY_CARE_PROVIDER_SITE_OTHER): Payer: PPO

## 2016-01-09 DIAGNOSIS — E538 Deficiency of other specified B group vitamins: Secondary | ICD-10-CM | POA: Diagnosis not present

## 2016-01-09 NOTE — Progress Notes (Signed)
B12 injection administered by this writer according to NP's order.  Patient tolerated procedure well and this completes his 4 week ordered regimen.  Patient aware.

## 2016-01-17 ENCOUNTER — Emergency Department (HOSPITAL_COMMUNITY): Payer: PPO

## 2016-01-17 ENCOUNTER — Encounter (HOSPITAL_COMMUNITY): Payer: Self-pay | Admitting: Emergency Medicine

## 2016-01-17 ENCOUNTER — Inpatient Hospital Stay (HOSPITAL_COMMUNITY)
Admission: EM | Admit: 2016-01-17 | Discharge: 2016-01-21 | DRG: 247 | Disposition: A | Discharge status: Home Health | Payer: PPO | Attending: Cardiology | Admitting: Cardiology

## 2016-01-17 DIAGNOSIS — R0789 Other chest pain: Secondary | ICD-10-CM | POA: Diagnosis not present

## 2016-01-17 DIAGNOSIS — J410 Simple chronic bronchitis: Secondary | ICD-10-CM

## 2016-01-17 DIAGNOSIS — I10 Essential (primary) hypertension: Secondary | ICD-10-CM | POA: Diagnosis not present

## 2016-01-17 DIAGNOSIS — Z7982 Long term (current) use of aspirin: Secondary | ICD-10-CM | POA: Diagnosis not present

## 2016-01-17 DIAGNOSIS — G40909 Epilepsy, unspecified, not intractable, without status epilepticus: Secondary | ICD-10-CM | POA: Diagnosis present

## 2016-01-17 DIAGNOSIS — E785 Hyperlipidemia, unspecified: Secondary | ICD-10-CM | POA: Diagnosis present

## 2016-01-17 DIAGNOSIS — R079 Chest pain, unspecified: Secondary | ICD-10-CM | POA: Diagnosis not present

## 2016-01-17 DIAGNOSIS — I472 Ventricular tachycardia: Secondary | ICD-10-CM | POA: Diagnosis present

## 2016-01-17 DIAGNOSIS — E782 Mixed hyperlipidemia: Secondary | ICD-10-CM | POA: Diagnosis not present

## 2016-01-17 DIAGNOSIS — I214 Non-ST elevation (NSTEMI) myocardial infarction: Secondary | ICD-10-CM | POA: Diagnosis not present

## 2016-01-17 DIAGNOSIS — I4729 Other ventricular tachycardia: Secondary | ICD-10-CM

## 2016-01-17 DIAGNOSIS — J449 Chronic obstructive pulmonary disease, unspecified: Secondary | ICD-10-CM | POA: Diagnosis not present

## 2016-01-17 DIAGNOSIS — G250 Essential tremor: Secondary | ICD-10-CM | POA: Diagnosis present

## 2016-01-17 DIAGNOSIS — E119 Type 2 diabetes mellitus without complications: Secondary | ICD-10-CM | POA: Diagnosis not present

## 2016-01-17 DIAGNOSIS — Z79899 Other long term (current) drug therapy: Secondary | ICD-10-CM | POA: Diagnosis not present

## 2016-01-17 DIAGNOSIS — Z7951 Long term (current) use of inhaled steroids: Secondary | ICD-10-CM | POA: Diagnosis not present

## 2016-01-17 DIAGNOSIS — Z8249 Family history of ischemic heart disease and other diseases of the circulatory system: Secondary | ICD-10-CM | POA: Diagnosis not present

## 2016-01-17 DIAGNOSIS — Z955 Presence of coronary angioplasty implant and graft: Secondary | ICD-10-CM

## 2016-01-17 DIAGNOSIS — F329 Major depressive disorder, single episode, unspecified: Secondary | ICD-10-CM | POA: Diagnosis not present

## 2016-01-17 DIAGNOSIS — Z87891 Personal history of nicotine dependence: Secondary | ICD-10-CM

## 2016-01-17 DIAGNOSIS — I2510 Atherosclerotic heart disease of native coronary artery without angina pectoris: Secondary | ICD-10-CM | POA: Diagnosis not present

## 2016-01-17 DIAGNOSIS — I471 Supraventricular tachycardia, unspecified: Secondary | ICD-10-CM

## 2016-01-17 DIAGNOSIS — Z888 Allergy status to other drugs, medicaments and biological substances status: Secondary | ICD-10-CM | POA: Diagnosis not present

## 2016-01-17 LAB — HEPATIC FUNCTION PANEL
ALBUMIN: 3.8 g/dL (ref 3.5–5.0)
ALT: 11 U/L — ABNORMAL LOW (ref 17–63)
AST: 23 U/L (ref 15–41)
Alkaline Phosphatase: 95 U/L (ref 38–126)
Bilirubin, Direct: 0.2 mg/dL (ref 0.1–0.5)
Indirect Bilirubin: 0.9 mg/dL (ref 0.3–0.9)
TOTAL PROTEIN: 6.7 g/dL (ref 6.5–8.1)
Total Bilirubin: 1.1 mg/dL (ref 0.3–1.2)

## 2016-01-17 LAB — BASIC METABOLIC PANEL
Anion gap: 10 (ref 5–15)
BUN: 9 mg/dL (ref 6–20)
CO2: 27 mmol/L (ref 22–32)
Calcium: 9.6 mg/dL (ref 8.9–10.3)
Chloride: 104 mmol/L (ref 101–111)
Creatinine, Ser: 0.89 mg/dL (ref 0.61–1.24)
GFR calc Af Amer: >60 mL/min (ref >60)
GFR calc non Af Amer: >60 mL/min (ref >60)
Glucose, Bld: 130 mg/dL — ABNORMAL HIGH (ref 65–99)
Potassium: 4.8 mmol/L (ref 3.5–5.1)
Sodium: 141 mmol/L (ref 135–145)

## 2016-01-17 LAB — CBC
HCT: 51.2 % (ref 39.0–52.0)
Hemoglobin: 17.5 g/dL — ABNORMAL HIGH (ref 13.0–17.0)
MCH: 29.3 pg (ref 26.0–34.0)
MCHC: 34.2 g/dL (ref 30.0–36.0)
MCV: 85.8 fL (ref 78.0–100.0)
Platelets: 226 10*3/uL (ref 150–400)
RBC: 5.97 MIL/uL — ABNORMAL HIGH (ref 4.22–5.81)
RDW: 13.1 % (ref 11.5–15.5)
WBC: 9.5 10*3/uL (ref 4.0–10.5)

## 2016-01-17 LAB — I-STAT TROPONIN, ED: Troponin i, poc: 0.06 ng/mL (ref 0.00–0.08)

## 2016-01-17 LAB — TROPONIN I
TROPONIN I: 0.14 ng/mL — AB (ref <0.03)
Troponin I: 0.09 ng/mL (ref <0.03)

## 2016-01-17 LAB — TSH: TSH: 1.647 u[IU]/mL (ref 0.350–4.500)

## 2016-01-17 LAB — T4, FREE: FREE T4: 1.19 ng/dL — AB (ref 0.61–1.12)

## 2016-01-17 LAB — MAGNESIUM: MAGNESIUM: 2 mg/dL (ref 1.7–2.4)

## 2016-01-17 MED ORDER — ONDANSETRON HCL 4 MG/2ML IJ SOLN
4.0000 mg | Freq: Four times a day (QID) | INTRAMUSCULAR | Status: DC | PRN
  Administered 2016-01-19: 4 mg via INTRAVENOUS
  Filled 2016-01-17: qty 2

## 2016-01-17 MED ORDER — ASPIRIN 81 MG PO CHEW
324.0000 mg | CHEWABLE_TABLET | Freq: Once | ORAL | Status: AC
  Administered 2016-01-17: 324 mg via ORAL
  Filled 2016-01-17: qty 4

## 2016-01-17 MED ORDER — ATORVASTATIN CALCIUM 40 MG PO TABS
40.0000 mg | ORAL_TABLET | Freq: Every day | ORAL | Status: DC
  Administered 2016-01-17 – 2016-01-20 (×4): 40 mg via ORAL
  Filled 2016-01-17 (×4): qty 1

## 2016-01-17 MED ORDER — METOPROLOL TARTRATE 12.5 MG HALF TABLET
12.5000 mg | ORAL_TABLET | Freq: Two times a day (BID) | ORAL | Status: DC
  Administered 2016-01-17 – 2016-01-19 (×5): 12.5 mg via ORAL
  Filled 2016-01-17 (×6): qty 1

## 2016-01-17 MED ORDER — NITROGLYCERIN 0.4 MG SL SUBL: 0.4000 mg | SUBLINGUAL_TABLET | SUBLINGUAL | Status: DC | PRN

## 2016-01-17 MED ORDER — NITROGLYCERIN IN D5W 200-5 MCG/ML-% IV SOLN
0.0000 ug/min | INTRAVENOUS | Status: DC
  Administered 2016-01-17: 5 ug/min via INTRAVENOUS
  Filled 2016-01-17: qty 250

## 2016-01-17 MED ORDER — MOMETASONE FURO-FORMOTEROL FUM 200-5 MCG/ACT IN AERO
2.0000 | INHALATION_SPRAY | Freq: Two times a day (BID) | RESPIRATORY_TRACT | Status: DC
  Administered 2016-01-17 – 2016-01-21 (×7): 2 via RESPIRATORY_TRACT
  Filled 2016-01-17 (×2): qty 8.8

## 2016-01-17 MED ORDER — HEPARIN BOLUS VIA INFUSION
4000.0000 [IU] | Freq: Once | INTRAVENOUS | Status: AC
  Administered 2016-01-17: 4000 [IU] via INTRAVENOUS
  Filled 2016-01-17: qty 4000

## 2016-01-17 MED ORDER — SODIUM CHLORIDE 0.9% FLUSH: 3.0000 mL | INTRAVENOUS | Status: DC | PRN

## 2016-01-17 MED ORDER — ASPIRIN EC 81 MG PO TBEC
81.0000 mg | DELAYED_RELEASE_TABLET | Freq: Every day | ORAL | Status: DC
  Administered 2016-01-18: 81 mg via ORAL
  Filled 2016-01-17: qty 1

## 2016-01-17 MED ORDER — MORPHINE SULFATE (PF) 4 MG/ML IV SOLN
4.0000 mg | Freq: Once | INTRAVENOUS | Status: AC
Start: 2016-01-17 — End: 2016-01-17
  Administered 2016-01-17: 4 mg via INTRAVENOUS
  Filled 2016-01-17: qty 1

## 2016-01-17 MED ORDER — SODIUM CHLORIDE 0.9 % IV SOLN: 250.0000 mL | INTRAVENOUS | Status: DC | PRN

## 2016-01-17 MED ORDER — SODIUM CHLORIDE 0.9% FLUSH
3.0000 mL | Freq: Two times a day (BID) | INTRAVENOUS | Status: DC
  Administered 2016-01-18 – 2016-01-20 (×2): 3 mL via INTRAVENOUS

## 2016-01-17 MED ORDER — ACETAMINOPHEN 325 MG PO TABS: 650.0000 mg | ORAL_TABLET | ORAL | Status: DC | PRN

## 2016-01-17 MED ORDER — CARBIDOPA-LEVODOPA 25-100 MG PO TABS
1.0000 | ORAL_TABLET | Freq: Three times a day (TID) | ORAL | Status: DC
  Administered 2016-01-17 – 2016-01-21 (×9): 1 via ORAL
  Filled 2016-01-17 (×13): qty 1

## 2016-01-17 MED ORDER — LEVETIRACETAM 500 MG PO TABS
500.0000 mg | ORAL_TABLET | Freq: Two times a day (BID) | ORAL | Status: DC
  Administered 2016-01-17 – 2016-01-18 (×2): 500 mg via ORAL
  Filled 2016-01-17 (×2): qty 1

## 2016-01-17 MED ORDER — ALBUTEROL SULFATE (2.5 MG/3ML) 0.083% IN NEBU: 2.5000 mg | INHALATION_SOLUTION | RESPIRATORY_TRACT | Status: DC | PRN

## 2016-01-17 MED ORDER — MORPHINE SULFATE (PF) 2 MG/ML IV SOLN
2.0000 mg | INTRAVENOUS | Status: DC | PRN
  Administered 2016-01-17 – 2016-01-19 (×2): 2 mg via INTRAVENOUS
  Filled 2016-01-17 (×2): qty 1

## 2016-01-17 MED ORDER — ZOLPIDEM TARTRATE 5 MG PO TABS
5.0000 mg | ORAL_TABLET | Freq: Every evening | ORAL | Status: DC | PRN
  Administered 2016-01-17: 5 mg via ORAL
  Filled 2016-01-17: qty 1

## 2016-01-17 MED ORDER — ALBUTEROL SULFATE HFA 108 (90 BASE) MCG/ACT IN AERS: 2.0000 | INHALATION_SPRAY | Freq: Four times a day (QID) | RESPIRATORY_TRACT | Status: DC | PRN

## 2016-01-17 MED ORDER — HEPARIN (PORCINE) IN NACL 100-0.45 UNIT/ML-% IJ SOLN
1100.0000 [IU]/h | INTRAMUSCULAR | Status: DC
  Administered 2016-01-17: 900 [IU]/h via INTRAVENOUS
  Filled 2016-01-17 (×2): qty 250

## 2016-01-17 NOTE — Progress Notes (Signed)
ANTICOAGULATION CONSULT NOTE - Initial Consult  Pharmacy Consult for Heparin Indication: chest pain/ACS  Allergies  Allergen Reactions  . Levaquin [Levofloxacin In D5w] Nausea And Vomiting  . Penicillins Hives    Patient Measurements: Height: 5\' 9"  (175.3 cm) Weight: 160 lb (72.6 kg) IBW/kg (Calculated) : 70.7  Vital Signs: Temp: 97.8 F (36.6 C) (10/07 1143) Temp Source: Oral (10/07 1143) BP: 156/82 (10/07 1400) Pulse Rate: 73 (10/07 1400)  Labs:  Recent Labs  01/17/16 1148 01/17/16 1431  HGB 17.5*  --   HCT 51.2  --   PLT 226  --   CREATININE 0.89  --   TROPONINI  --  0.09*    Estimated Creatinine Clearance: 73.9 mL/min (by C-G formula based on SCr of 0.89 mg/dL).   Medical History: Past Medical History:  Diagnosis Date  . Chest pain   . COPD (chronic obstructive pulmonary disease) ([place])   . Depression   . Diabetes ([place])   . Dysphagia   . Essential tremor   . Heart disease   . Hyperlipidemia   . Hypertension   . Seizure disorder ([place])   . Tardive dyskinesia   . Vitamin D deficiency     Medications:  No anticoagulants pta  Assessment: 75yom presents to the ED with chest pain and elevated troponin. He will begin heparin for NSTEMI. Baseline labs wnl.  Goal of Therapy:  Heparin level 0.3-0.7 units/ml Monitor platelets by anticoagulation protocol: Yes   Plan:  1) Heparin bolus 4000 units x 1 2) Heparin drip at 900 units/hr 3) Check 6 hour heparin level 4) Daily heparin level and CBC  Deboraha Sprang 01/17/2016,5:56 PM

## 2016-01-17 NOTE — ED Triage Notes (Signed)
Pt. Stated, Im having heart pain since yesterday. Denies any other symptoms

## 2016-01-17 NOTE — ED Notes (Signed)
Attempted report 

## 2016-01-17 NOTE — Consult Note (Signed)
CARDIOLOGY CONSULT NOTE   Patient ID: [name] MRN: [id number], DOB/AGE: Jul 15, 1940   Admit date: 01/17/2016 Date of Consult: 01/17/2016  Primary Physician: Sheral Flow, NP Primary Cardiologist: new patient  Reason for consult:  Chest pain, NSTEMI  Problem List  Past Medical History:  Diagnosis Date  . Chest pain   . COPD (chronic obstructive pulmonary disease) ([place])   . Depression   . Diabetes ([place])   . Dysphagia   . Essential tremor   . Heart disease   . Hyperlipidemia   . Hypertension   . Seizure disorder ([place])   . Tardive dyskinesia   . Vitamin D deficiency     Past Surgical History:  Procedure Laterality Date  . INGUINAL HERNIA REPAIR Left as child     Allergies  Allergies  Allergen Reactions  . Levaquin [Levofloxacin In D5w] Nausea And Vomiting  . Penicillins Hives    HPI   75 y.o.malewith medical history significant for hypertension, hyperlipidemia, COPD since to the ER with chest pain, troponin Elevated 0.27, he underwent stress testing that showed no ischemia but prior infarct. He was discharged home with no follow-up. His echocardiogram showed normal LVEF grade 1 diastolic dysfunction. No significant valvular abnormality.  The patient is presenting back with left-sided chest pain associated with nausea and shortness of breath, it has started yesterday and has been persistent. The severity fluctuates, troponin this admission is 0.09 with normal creatinine and normal hemoglobin. The patient has ongoing chest pain, his EKG shows sinus rhythm and new negative ST depressions in the inferolateral leads.  Inpatient Medications  . aspirin  324 mg Oral Once    Family History Family History  Problem Relation Age of Onset  . Lung cancer Father   . CAD Brother     MI in his 64s      Social History Social History   Social History  . Marital status: Married    Spouse name: N/A  . Number of children: N/A  . Years of  education: N/A   Occupational History  . Not on file.   Social History Main Topics  . Smoking status: Former Smoker    Quit date: 09/04/2004  . Smokeless tobacco: Never Used  . Alcohol use No  . Drug use: No  . Sexual activity: Not on file   Other Topics Concern  . Not on file   Social History Narrative   Married.   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   Enjoys spending time with family.      Review of Systems  General:  No chills, fever, night sweats or weight changes.  Cardiovascular:  No chest pain, dyspnea on exertion, edema, orthopnea, palpitations, paroxysmal nocturnal dyspnea. Dermatological: No rash, lesions/masses Respiratory: No cough, dyspnea Urologic: No hematuria, dysuria Abdominal:   No nausea, vomiting, diarrhea, bright red blood per rectum, melena, or hematemesis Neurologic:  No visual changes, wkns, changes in mental status. All other systems reviewed and are otherwise negative except as noted above.  Physical Exam  Blood pressure 156/82, pulse 73, temperature 97.8 F (36.6 C), temperature source Oral, resp. rate 17, height 5\' 9"  (1.753 m), weight 160 lb (72.6 kg), SpO2 95 %.  General: Pleasant, In mild distress secondary to chest pain Psych: Normal affect. Neuro: Alert and oriented X 3. Moves all extremities spontaneously. HEENT: Normal  Neck: Supple without bruits or JVD. Lungs:  Resp regular and unlabored, mild wheezing.Marland Kitchen Heart: RRR no s3, s4, or murmurs. Abdomen: Soft, non-tender, non-distended, BS +  x 4.  Extremities: No clubbing, cyanosis or edema. DP/PT/Radials 2+ and equal bilaterally.  Labs   Recent Labs  01/17/16 1431  TROPONINI 0.09*   Lab Results  Component Value Date   WBC 9.5 01/17/2016   HGB 17.5 (H) 01/17/2016   HCT 51.2 01/17/2016   MCV 85.8 01/17/2016   PLT 226 01/17/2016    Recent Labs Lab 01/17/16 1148  NA 141  K 4.8  CL 104  CO2 27  BUN 9  CREATININE 0.89  CALCIUM 9.6  GLUCOSE 130*   Lab Results    Component Value Date   CHOL 197 12/14/2015   HDL 32 (L) 12/14/2015   LDLCALC 130 (H) 12/14/2015   TRIG 174 (H) 12/14/2015   Lab Results  Component Value Date   DDIMER 0.34 12/14/2015   Invalid input(s): POCBNP  Radiology/Studies  Dg Chest 2 View  Result Date: 01/17/2016 CLINICAL DATA:  Chest pain for 2 days.  Nausea. EXAM: CHEST  2 VIEW COMPARISON:  December 14, 2015 FINDINGS: There is no edema or consolidation. Heart size and pulmonary vascularity are normal. No adenopathy. There is atherosclerotic calcification in the aorta. No bone lesions are evident. IMPRESSION: Aortic atherosclerosis.  No edema or consolidation. Electronically Signed   By: Lowella Grip III M.D.   On: 01/17/2016 13:40   Echocardiogram - 12/15/2015 Left ventricle: The cavity size was normal. Wall thickness was   increased in a pattern of mild LVH. Systolic function was normal.   The estimated ejection fraction was in the range of 60% to 65%.   Wall motion was normal; there were no regional wall motion   abnormalities. Doppler parameters are consistent with abnormal   left ventricular relaxation (grade 1 diastolic dysfunction). - Aortic valve: Mildly calcified annulus. Trileaflet.  Nuclear lexiscan stress test 12/15/2015  IMPRESSION: 1. No evidence of reversible ischemia. Large fixed defect/ scar in the inferior and septal walls.  2. Severe septal hypokinesis.  3. Left ventricular ejection fraction 50%  4. Non invasive risk stratification*: Intermediate   ECG: Sinus rhythm, significant ST depressions in inferolateral leads that were not previously present.    ASSESSMENT AND PLAN  1. NSTEMI - the patient has active chest pain and new ST depressions in the inferolateral leads, he has elevated troponin at 0.09 we will cycle. We'll start nitroglycerin drip and heparin drip. We will start low-dose of carvedilol 3.125 by mouth twice a day. We'll also start aspirin and atorvastatin 40 mg daily as  his prior history of muscle disease with atrophy.  2. Hypertension - for now we will start nitro drip and low-dose carvedilol as he has history of bradycardia with beta blockers.  3. Hyperlipidemia - atorvastatin 40 mg daily as his prior history of muscle disease.  4. COPD - distant history of smoking. He has some mild wheezes we'll start breathing treatment.  Signed, Ena Dawley, MD, [facility] 01/17/2016, 4:35 PM

## 2016-01-17 NOTE — ED Provider Notes (Signed)
[facility] DEPT Provider Note   CSN: [id number] Arrival date & time: 01/17/16  1140  By signing my name below, I, Evelene Croon, attest that this documentation has been prepared under the direction and in the presence of Virgel Manifold, MD . Electronically Signed: Evelene Croon, Scribe. 01/17/2016. 12:16 PM.    History   Chief Complaint Chief Complaint  Patient presents with  . Chest Pain   The history is provided by the patient. No language interpreter was used.    HPI Comments:  [name] is a 75 y.o. male with a history of HTN, HLD, CAD, and DM, who presents to the Emergency Department complaining of sharp, non-radiating, left sided CP which began yesterday. Pt states his pain has been intermittent since onset with some episodes lasting longer than others. He rates his pain a 8/10 at this AM. Pt reports associated nausea and cough. He denies SOB, fever, and chills. Pt's family states pt was seen in the ED ~ 1 month ago for the same; discharged with instruction to take 81 mg ASA daily. He denies h/o gallbladder issues.  Past Medical History:  Diagnosis Date  . Chest pain   . COPD (chronic obstructive pulmonary disease) ([place])   . Depression   . Diabetes ([place])   . Dysphagia   . Essential tremor   . Heart disease   . Hyperlipidemia   . Hypertension   . Seizure disorder ([place])   . Tardive dyskinesia   . Vitamin D deficiency     Patient Active Problem List   Diagnosis Date Noted  . Parkinson's variant of multiple system atrophy ([place]) 12/19/2015  . Encephalopathy chronic 12/14/2015  . Chest pain at rest   . COPD (chronic obstructive pulmonary disease) ([place])   . Heart disease   . Seizure disorder ([place])   . Depression   . Tardive dyskinesia   . Dysphagia   . Skin lesion 08/14/2015  . Shortness of breath 08/14/2015  . Essential hypertension 08/14/2015  . Hyperlipidemia 08/14/2015  . Essential tremor 08/14/2015    Past Surgical History:  Procedure  Laterality Date  . INGUINAL HERNIA REPAIR Left as child       Home Medications    Prior to Admission medications   Medication Sig Start Date End Date Taking? Authorizing Provider  albuterol (PROVENTIL HFA;VENTOLIN HFA) 108 (90 Base) MCG/ACT inhaler Inhale 2 puffs into the lungs every 6 (six) hours as needed for wheezing or shortness of breath. 12/16/15   Belkys A Regalado, MD  aspirin EC 81 MG EC tablet Take 1 tablet (81 mg total) by mouth daily. 12/16/15   Belkys A Regalado, MD  carbidopa-levodopa (SINEMET IR) 25-100 MG tablet Take 1 tablet by mouth 3 (three) times daily. 12/23/15   Eustace Quail Tat, DO  cyanocobalamin (,VITAMIN B-12,) 1000 MCG/ML injection Once a week for 4 weeks, starting 12/19/15. Patient supplied first dose. 12/19/15   Elby Beck, FNP  Fluticasone-Salmeterol (ADVAIR DISKUS) 250-50 MCG/DOSE AEPB Inhale 1 puff into the lungs every 12 (twelve) hours. 08/26/15   Pleas Koch, NP  levETIRAcetam (KEPPRA) 500 MG tablet Take 1 tablet (500 mg total) by mouth 2 (two) times daily. 12/23/15   Eustace Quail Tat, DO  metoprolol tartrate (LOPRESSOR) 25 MG tablet Take 0.5 tablets (12.5 mg total) by mouth 2 (two) times daily. Patient not taking: Reported on 12/23/2015 12/16/15   Elmarie Shiley, MD    Family History Family History  Problem Relation Age of Onset  . Lung cancer  Father   . CAD Brother     MI in his 42s     Social History Social History  Substance Use Topics  . Smoking status: Former Smoker    Quit date: 09/04/2004  . Smokeless tobacco: Never Used  . Alcohol use No     Allergies   Levaquin [levofloxacin in d5w] and Penicillins   Review of Systems Review of Systems  Constitutional: Negative for chills and fever.  Respiratory: Positive for cough. Negative for shortness of breath.   Cardiovascular: Positive for chest pain.  Gastrointestinal: Positive for nausea. Negative for vomiting.  All other systems reviewed and are negative.    Physical Exam Updated  Vital Signs BP 183/95 (BP Location: Right Arm)   Pulse 91   Temp 97.8 F (36.6 C) (Oral)   Resp 16   Ht 5\' 9"  (1.753 m)   Wt 160 lb (72.6 kg)   SpO2 99%   BMI 23.63 kg/m   Physical Exam  Constitutional: He is oriented to person, place, and time. He appears well-developed and well-nourished.  HENT:  Head: Normocephalic and atraumatic.  Eyes: EOM are normal.  Neck: Normal range of motion.  Cardiovascular: Normal rate, regular rhythm, normal heart sounds and intact distal pulses.   Pes excavatum   Pulmonary/Chest: Effort normal and breath sounds normal. No respiratory distress.  Abdominal: Soft. He exhibits no distension. There is tenderness. There is no rebound and no guarding.   RUQ TTP  Musculoskeletal: Normal range of motion.  Neurological: He is alert and oriented to person, place, and time.  Resting tremor consistent with parkinson's   Skin: Skin is warm and dry.  Psychiatric: He has a normal mood and affect. Judgment normal.  Nursing note and vitals reviewed.    ED Treatments / Results  DIAGNOSTIC STUDIES:  Oxygen Saturation is 99% on RA, normal by my interpretation.    COORDINATION OF CARE:  12:13 PM Discussed treatment plan with pt at bedside and pt agreed to plan.  Labs (all labs ordered are listed, but only abnormal results are displayed) Labs Reviewed  BASIC METABOLIC PANEL - Abnormal; Notable for the following:       Result Value   Glucose, Bld 130 (*)    All other components within normal limits  CBC - Abnormal; Notable for the following:    RBC 5.97 (*)    Hemoglobin 17.5 (*)    All other components within normal limits  TROPONIN I - Abnormal; Notable for the following:    Troponin I 0.09 (*)    All other components within normal limits  I-STAT TROPOININ, ED    EKG  EKG Interpretation  Date/Time:  Saturday January 17 2016 11:41:27 EDT Ventricular Rate:  91 PR Interval:  170 QRS Duration: 78 QT Interval:  364 QTC Calculation: 447 R  Axis:   -9 Text Interpretation:  Normal sinus rhythm Anterior infarct , age undetermined Marked ST abnormality, possible inferior subendocardial injury Abnormal ECG Confirmed by Wilson Singer  MD, Maleyah Evans [phone]) on 01/17/2016 12:24:38 PM       Radiology Dg Chest 2 View  Result Date: 01/17/2016 CLINICAL DATA:  Chest pain for 2 days.  Nausea. EXAM: CHEST  2 VIEW COMPARISON:  December 14, 2015 FINDINGS: There is no edema or consolidation. Heart size and pulmonary vascularity are normal. No adenopathy. There is atherosclerotic calcification in the aorta. No bone lesions are evident. IMPRESSION: Aortic atherosclerosis.  No edema or consolidation. Electronically Signed   By: Lowella Grip III M.D.  On: 01/17/2016 13:40    Procedures Procedures (including critical care time)  Medications Ordered in ED Medications - No data to display   Initial Impression / Assessment and Plan / ED Course  I have reviewed the triage vital signs and the nursing notes.  Pertinent labs & imaging results that were available during my care of the patient were reviewed by me and considered in my medical decision making (see chart for details).  Clinical Course    75 year old male with chest pain. Typical and atypical features. Troponin is mildly elevated. EKG shows nonspecific changes. Discussed with Dr. Meda Coffee, cardiology. To evaluate in the emergency room. Further disposition pending cardiology's recommendations.  Final Clinical Impressions(s) / ED Diagnoses   Final diagnoses:  Chest pain, unspecified type    New Prescriptions New Prescriptions   No medications on file    I personally preformed the services scribed in my presence. The recorded information has been reviewed is accurate. Virgel Manifold, MD.     Virgel Manifold, MD 01/19/16 [phone]

## 2016-01-18 DIAGNOSIS — I214 Non-ST elevation (NSTEMI) myocardial infarction: Secondary | ICD-10-CM | POA: Diagnosis not present

## 2016-01-18 DIAGNOSIS — I472 Ventricular tachycardia: Secondary | ICD-10-CM | POA: Diagnosis not present

## 2016-01-18 DIAGNOSIS — E119 Type 2 diabetes mellitus without complications: Secondary | ICD-10-CM | POA: Diagnosis not present

## 2016-01-18 DIAGNOSIS — I10 Essential (primary) hypertension: Secondary | ICD-10-CM | POA: Diagnosis not present

## 2016-01-18 DIAGNOSIS — I2510 Atherosclerotic heart disease of native coronary artery without angina pectoris: Secondary | ICD-10-CM | POA: Diagnosis not present

## 2016-01-18 DIAGNOSIS — I471 Supraventricular tachycardia: Secondary | ICD-10-CM | POA: Diagnosis not present

## 2016-01-18 DIAGNOSIS — E785 Hyperlipidemia, unspecified: Secondary | ICD-10-CM | POA: Diagnosis not present

## 2016-01-18 DIAGNOSIS — G40909 Epilepsy, unspecified, not intractable, without status epilepticus: Secondary | ICD-10-CM | POA: Diagnosis not present

## 2016-01-18 DIAGNOSIS — G250 Essential tremor: Secondary | ICD-10-CM | POA: Diagnosis not present

## 2016-01-18 DIAGNOSIS — Z87891 Personal history of nicotine dependence: Secondary | ICD-10-CM | POA: Diagnosis not present

## 2016-01-18 DIAGNOSIS — Z79899 Other long term (current) drug therapy: Secondary | ICD-10-CM | POA: Diagnosis not present

## 2016-01-18 DIAGNOSIS — J449 Chronic obstructive pulmonary disease, unspecified: Secondary | ICD-10-CM | POA: Diagnosis not present

## 2016-01-18 LAB — BASIC METABOLIC PANEL
Anion gap: 10 (ref 5–15)
BUN: 12 mg/dL (ref 6–20)
CALCIUM: 8.6 mg/dL — AB (ref 8.9–10.3)
CHLORIDE: 100 mmol/L — AB (ref 101–111)
CO2: 26 mmol/L (ref 22–32)
CREATININE: 0.75 mg/dL (ref 0.61–1.24)
GFR calc Af Amer: >60 mL/min (ref >60)
GFR calc non Af Amer: >60 mL/min (ref >60)
GLUCOSE: 126 mg/dL — AB (ref 65–99)
Potassium: 3.7 mmol/L (ref 3.5–5.1)
Sodium: 136 mmol/L (ref 135–145)

## 2016-01-18 LAB — CBC
HCT: 42.3 % (ref 39.0–52.0)
Hemoglobin: 14.1 g/dL (ref 13.0–17.0)
MCH: 28.4 pg (ref 26.0–34.0)
MCHC: 33.3 g/dL (ref 30.0–36.0)
MCV: 85.1 fL (ref 78.0–100.0)
Platelets: 211 10*3/uL (ref 150–400)
RBC: 4.97 MIL/uL (ref 4.22–5.81)
RDW: 13.2 % (ref 11.5–15.5)
WBC: 8.1 10*3/uL (ref 4.0–10.5)

## 2016-01-18 LAB — HEPARIN LEVEL (UNFRACTIONATED)
Heparin Unfractionated: 0.24 IU/mL — ABNORMAL LOW (ref 0.30–0.70)
Heparin Unfractionated: 0.55 IU/mL (ref 0.30–0.70)

## 2016-01-18 LAB — MRSA PCR SCREENING: MRSA by PCR: NEGATIVE

## 2016-01-18 LAB — TROPONIN I
Troponin I: 0.16 ng/mL (ref <0.03)
Troponin I: 0.17 ng/mL (ref <0.03)

## 2016-01-18 MED ORDER — HEPARIN BOLUS VIA INFUSION
1000.0000 [IU] | Freq: Once | INTRAVENOUS | Status: AC
  Administered 2016-01-18: 1000 [IU] via INTRAVENOUS
  Filled 2016-01-18: qty 1000

## 2016-01-18 MED ORDER — SODIUM CHLORIDE 0.9 % WEIGHT BASED INFUSION: 1.0000 mL/kg/h | INTRAVENOUS | Status: DC

## 2016-01-18 MED ORDER — SODIUM CHLORIDE 0.9% FLUSH: 3.0000 mL | Freq: Two times a day (BID) | INTRAVENOUS | Status: DC

## 2016-01-18 MED ORDER — SODIUM CHLORIDE 0.9 % WEIGHT BASED INFUSION
3.0000 mL/kg/h | INTRAVENOUS | Status: DC
  Administered 2016-01-19: 3 mL/kg/h via INTRAVENOUS

## 2016-01-18 MED ORDER — SODIUM CHLORIDE 0.9% FLUSH: 3.0000 mL | INTRAVENOUS | Status: DC | PRN

## 2016-01-18 MED ORDER — SODIUM CHLORIDE 0.9 % IV SOLN: 250.0000 mL | INTRAVENOUS | Status: DC | PRN

## 2016-01-18 MED ORDER — ASPIRIN 81 MG PO CHEW
81.0000 mg | CHEWABLE_TABLET | ORAL | Status: AC
  Administered 2016-01-19: 81 mg via ORAL
  Filled 2016-01-18: qty 1

## 2016-01-18 NOTE — Progress Notes (Signed)
ANTICOAGULATION CONSULT NOTE - Follow Up Consult  Pharmacy Consult for heparin Indication: NSTEMI  Labs:  Recent Labs  01/17/16 1148 01/17/16 1431 01/17/16 1832 01/18/16 0018 01/18/16 0204  HGB 17.5*  --   --   --   --   HCT 51.2  --   --   --   --   PLT 226  --   --   --   --   HEPARINUNFRC  --   --   --   --  0.24*  CREATININE 0.89  --   --   --   --   TROPONINI  --  0.09* 0.14* 0.16*  --     Assessment: 75yo male subtherapeutic on heparin with initial dosing for NSTEMI.  Goal of Therapy:  Heparin level 0.3-0.7 units/ml   Plan:  Will rebolus with heparin 1000 units and increase gtt by ~2-3 units/kg/hr to 1100 units/hr and check level in 6hr.  Wynona Neat, PharmD, BCPS  01/18/2016,3:01 AM

## 2016-01-18 NOTE — Progress Notes (Signed)
Subjective:  Patient is pain-free today.  He has mild elevation of troponin and new EKG changes with chest pain suggestive of ischemia.  He gives a history of having had a heart cath at [facility] a number of years ago.  Objective:  Vital Signs in the last 24 hours: BP 124/64   Pulse 85   Temp 97.8 F (36.6 C) (Oral)   Resp (!) 21   Ht 5\' 9"  (1.753 m)   Wt 69.9 kg (154 lb 1.6 oz)   SpO2 96%   BMI 22.76 kg/m   Physical Exam: Pleasant male in no acute distress Lungs:  Clear Cardiac:  Regular rhythm, normal S1 and S2, no S3 Abdomen:  Soft, nontender, no masses Extremities:  No edema present, radial pulse present on the right  Intake/Output from previous day: 10/07 0701 - 10/08 0700 In: 365 [P.O.:240; I.V.:125] Out: 150 [Urine:150]  Weight Filed Weights   01/17/16 1143 01/17/16 2029 01/18/16 0505  Weight: 72.6 kg (160 lb) 69.6 kg (153 lb 6.4 oz) 69.9 kg (154 lb 1.6 oz)    Lab Results: Basic Metabolic Panel:  Recent Labs  01/17/16 1148 01/18/16 0535  NA 141 136  K 4.8 3.7  CL 104 100*  CO2 27 26  GLUCOSE 130* 126*  BUN 9 12  CREATININE 0.89 0.75   CBC:  Recent Labs  01/17/16 1148 01/18/16 0535  WBC 9.5 8.1  HGB 17.5* 14.1  HCT 51.2 42.3  MCV 85.8 85.1  PLT 226 211   Cardiac Enzymes: Troponin (Point of Care Test)  Recent Labs  01/17/16 1157  TROPIPOC 0.06   Cardiac Panel (last 3 results)  Recent Labs  01/17/16 1832 01/18/16 0018 01/18/16 0535  TROPONINI 0.14* 0.16* 0.17*    Telemetry: Sinus rhythm  Assessment/Plan:  1.  Non-STEMI-he has been started on heparin and aspirin atorvastatin beta blockers and is on IV nitroglycerin.  With recurrent chest pain recommend cardiac catheterization for further evaluation.  Cardiac catheterization was discussed with the patient fully including risks of myocardial infarction, death, stroke, bleeding, arrhythmia, dye allergy, renal insufficiency or bleeding.  The patient understands and is willing to proceed.   Possibility of intervention at the same time also discussed with patient and they understand and are agreeable to proceed.  Discussed radial approach. 2.  Hypertension controlled 3.  Hyperlipidemia under treatment       W. Doristine Church  MD [facility]  01/18/2016, 9:51 AM

## 2016-01-18 NOTE — Progress Notes (Signed)
ANTICOAGULATION CONSULT NOTE - Follow Up Consult  Pharmacy Consult for heparin Indication: NSTEMI  Labs:  Recent Labs  01/17/16 1148  01/17/16 1832 01/18/16 0018 01/18/16 0204 01/18/16 0535 01/18/16 0834  HGB 17.5*  --   --   --   --  14.1  --   HCT 51.2  --   --   --   --  42.3  --   PLT 226  --   --   --   --  211  --   HEPARINUNFRC  --   --   --   --  0.24*  --  0.55  CREATININE 0.89  --   --   --   --  0.75  --   TROPONINI  --   < > 0.14* 0.16*  --  0.17*  --   < > = values in this interval not displayed.  Assessment: 75yom presents to the ED with chest pain and elevated troponin. On IV heparin awaiting for cath. Heparin level (0.55) terapeutic on 1100 units/hr. CBC wnl.  Goal of Therapy:  Heparin level 0.3-0.7 units/ml   Plan:  Continue heparin infusion 1100 units/hr F/u AM labs.  Maryanna Shape, PharmD, BCPS  Clinical Pharmacist  Pager: [phone]   01/18/2016,9:57 AM

## 2016-01-18 NOTE — Progress Notes (Signed)
Pt and his wife  have concern about the cath tomorrow. Pt has not signed the consent yet due to concern about the feasibility of the radial approach while he has  Parkinson disease and seizures history. MD notified.

## 2016-01-19 ENCOUNTER — Encounter (HOSPITAL_COMMUNITY)
Admission: EM | Disposition: A | Discharge status: Home Health | Payer: Self-pay | Source: Home / Self Care | Attending: Cardiology

## 2016-01-19 ENCOUNTER — Encounter (HOSPITAL_COMMUNITY): Payer: Self-pay | Admitting: Internal Medicine

## 2016-01-19 DIAGNOSIS — E119 Type 2 diabetes mellitus without complications: Secondary | ICD-10-CM | POA: Diagnosis not present

## 2016-01-19 DIAGNOSIS — E785 Hyperlipidemia, unspecified: Secondary | ICD-10-CM | POA: Diagnosis not present

## 2016-01-19 DIAGNOSIS — G40909 Epilepsy, unspecified, not intractable, without status epilepticus: Secondary | ICD-10-CM | POA: Diagnosis not present

## 2016-01-19 DIAGNOSIS — Z79899 Other long term (current) drug therapy: Secondary | ICD-10-CM | POA: Diagnosis not present

## 2016-01-19 DIAGNOSIS — I214 Non-ST elevation (NSTEMI) myocardial infarction: Secondary | ICD-10-CM | POA: Diagnosis not present

## 2016-01-19 DIAGNOSIS — I472 Ventricular tachycardia: Secondary | ICD-10-CM | POA: Diagnosis not present

## 2016-01-19 DIAGNOSIS — I2510 Atherosclerotic heart disease of native coronary artery without angina pectoris: Secondary | ICD-10-CM

## 2016-01-19 DIAGNOSIS — Z87891 Personal history of nicotine dependence: Secondary | ICD-10-CM | POA: Diagnosis not present

## 2016-01-19 DIAGNOSIS — I10 Essential (primary) hypertension: Secondary | ICD-10-CM | POA: Diagnosis not present

## 2016-01-19 DIAGNOSIS — J449 Chronic obstructive pulmonary disease, unspecified: Secondary | ICD-10-CM | POA: Diagnosis not present

## 2016-01-19 DIAGNOSIS — I471 Supraventricular tachycardia: Secondary | ICD-10-CM | POA: Diagnosis not present

## 2016-01-19 DIAGNOSIS — G250 Essential tremor: Secondary | ICD-10-CM | POA: Diagnosis not present

## 2016-01-19 HISTORY — PX: CARDIAC CATHETERIZATION: SHX172

## 2016-01-19 LAB — CBC
HEMATOCRIT: 44 % (ref 39.0–52.0)
Hemoglobin: 14.8 g/dL (ref 13.0–17.0)
MCH: 28.7 pg (ref 26.0–34.0)
MCHC: 33.6 g/dL (ref 30.0–36.0)
MCV: 85.3 fL (ref 78.0–100.0)
PLATELETS: 200 10*3/uL (ref 150–400)
RBC: 5.16 MIL/uL (ref 4.22–5.81)
RDW: 13.2 % (ref 11.5–15.5)
WBC: 8.3 10*3/uL (ref 4.0–10.5)

## 2016-01-19 LAB — PROTIME-INR
INR: 1.07
PROTHROMBIN TIME: 13.9 s (ref 11.4–15.2)

## 2016-01-19 LAB — GLUCOSE, CAPILLARY
Glucose-Capillary: 113 mg/dL — ABNORMAL HIGH (ref 65–99)
Glucose-Capillary: 96 mg/dL (ref 65–99)
Glucose-Capillary: 83 mg/dL (ref 65–99)

## 2016-01-19 LAB — POCT ACTIVATED CLOTTING TIME: Activated Clotting Time: 323 seconds

## 2016-01-19 LAB — HEPARIN LEVEL (UNFRACTIONATED): Heparin Unfractionated: 0.46 IU/mL (ref 0.30–0.70)

## 2016-01-19 LAB — HEMOGLOBIN A1C
HEMOGLOBIN A1C: 5.6 % (ref 4.8–5.6)
Mean Plasma Glucose: 114 mg/dL

## 2016-01-19 SURGERY — LEFT HEART CATH AND CORONARY ANGIOGRAPHY

## 2016-01-19 MED ORDER — SODIUM CHLORIDE 0.9 % IV SOLN
INTRAVENOUS | Status: DC | PRN
  Administered 2016-01-19: 1.75 mg/kg/h via INTRAVENOUS

## 2016-01-19 MED ORDER — FENTANYL CITRATE (PF) 100 MCG/2ML IJ SOLN
INTRAMUSCULAR | Status: DC | PRN
  Administered 2016-01-19 (×3): 25 ug via INTRAVENOUS

## 2016-01-19 MED ORDER — TICAGRELOR 90 MG PO TABS
90.0000 mg | ORAL_TABLET | Freq: Two times a day (BID) | ORAL | Status: DC
  Administered 2016-01-19 – 2016-01-21 (×4): 90 mg via ORAL
  Filled 2016-01-19 (×4): qty 1

## 2016-01-19 MED ORDER — TICAGRELOR 90 MG PO TABS
ORAL_TABLET | ORAL | Status: DC | PRN
Start: 2016-01-19 — End: 2016-01-19
  Administered 2016-01-19: 180 mg via ORAL

## 2016-01-19 MED ORDER — NITROGLYCERIN 1 MG/10 ML FOR IR/CATH LAB
INTRA_ARTERIAL | Status: AC
  Filled 2016-01-19: qty 10

## 2016-01-19 MED ORDER — SODIUM CHLORIDE 0.9% FLUSH
3.0000 mL | Freq: Two times a day (BID) | INTRAVENOUS | Status: DC
  Administered 2016-01-19 – 2016-01-20 (×2): 3 mL via INTRAVENOUS

## 2016-01-19 MED ORDER — SODIUM CHLORIDE 0.9 % IV SOLN
1.7500 mg/kg/h | INTRAVENOUS | Status: AC
  Filled 2016-01-19: qty 250

## 2016-01-19 MED ORDER — SODIUM CHLORIDE 0.9 % WEIGHT BASED INFUSION: 1.0000 mL/kg/h | INTRAVENOUS | Status: AC

## 2016-01-19 MED ORDER — HEART ATTACK BOUNCING BOOK
Freq: Once | Status: AC
  Administered 2016-01-19: 20:00:00
  Filled 2016-01-19: qty 1

## 2016-01-19 MED ORDER — BIVALIRUDIN 250 MG IV SOLR
INTRAVENOUS | Status: AC
  Filled 2016-01-19: qty 250

## 2016-01-19 MED ORDER — SODIUM CHLORIDE 0.9% FLUSH: 3.0000 mL | INTRAVENOUS | Status: DC | PRN

## 2016-01-19 MED ORDER — NITROGLYCERIN 1 MG/10 ML FOR IR/CATH LAB
INTRA_ARTERIAL | Status: DC | PRN
  Administered 2016-01-19 (×2): 200 ug via INTRACORONARY

## 2016-01-19 MED ORDER — ONDANSETRON HCL 4 MG/2ML IJ SOLN: 4.0000 mg | Freq: Four times a day (QID) | INTRAMUSCULAR | Status: DC | PRN

## 2016-01-19 MED ORDER — ANGIOPLASTY BOOK
Freq: Once | Status: AC
  Administered 2016-01-19: 20:00:00
  Filled 2016-01-19: qty 1

## 2016-01-19 MED ORDER — IOPAMIDOL (ISOVUE-370) INJECTION 76%
INTRAVENOUS | Status: DC | PRN
  Administered 2016-01-19: 110 mL via INTRA_ARTERIAL

## 2016-01-19 MED ORDER — ASPIRIN 81 MG PO CHEW
81.0000 mg | CHEWABLE_TABLET | Freq: Every day | ORAL | Status: DC
  Administered 2016-01-20 – 2016-01-21 (×2): 81 mg via ORAL
  Filled 2016-01-19 (×2): qty 1

## 2016-01-19 MED ORDER — IOPAMIDOL (ISOVUE-370) INJECTION 76%
INTRAVENOUS | Status: AC
  Filled 2016-01-19: qty 100

## 2016-01-19 MED ORDER — HEPARIN (PORCINE) IN NACL 2-0.9 UNIT/ML-% IJ SOLN
INTRAMUSCULAR | Status: DC | PRN
  Administered 2016-01-19: 1000 mL

## 2016-01-19 MED ORDER — ACETAMINOPHEN 325 MG PO TABS
650.0000 mg | ORAL_TABLET | ORAL | Status: DC | PRN
  Administered 2016-01-19: 650 mg via ORAL
  Filled 2016-01-19: qty 2

## 2016-01-19 MED ORDER — LIDOCAINE HCL (PF) 1 % IJ SOLN
INTRAMUSCULAR | Status: AC
  Filled 2016-01-19: qty 30

## 2016-01-19 MED ORDER — HEPARIN (PORCINE) IN NACL 2-0.9 UNIT/ML-% IJ SOLN
INTRAMUSCULAR | Status: AC
  Filled 2016-01-19: qty 1000

## 2016-01-19 MED ORDER — SODIUM CHLORIDE 0.9 % IV SOLN: 250.0000 mL | INTRAVENOUS | Status: DC | PRN

## 2016-01-19 MED ORDER — TICAGRELOR 90 MG PO TABS
ORAL_TABLET | ORAL | Status: AC
  Filled 2016-01-19: qty 2

## 2016-01-19 MED ORDER — FENTANYL CITRATE (PF) 100 MCG/2ML IJ SOLN
INTRAMUSCULAR | Status: AC
  Filled 2016-01-19: qty 2

## 2016-01-19 MED ORDER — LIDOCAINE HCL (PF) 1 % IJ SOLN
INTRAMUSCULAR | Status: DC | PRN
  Administered 2016-01-19: 12 mL via INTRADERMAL

## 2016-01-19 MED ORDER — MIDAZOLAM HCL 2 MG/2ML IJ SOLN
INTRAMUSCULAR | Status: AC
  Filled 2016-01-19: qty 2

## 2016-01-19 MED ORDER — BIVALIRUDIN BOLUS VIA INFUSION - CUPID
INTRAVENOUS | Status: DC | PRN
  Administered 2016-01-19: 51.45 mg via INTRAVENOUS

## 2016-01-19 MED ORDER — MIDAZOLAM HCL 2 MG/2ML IJ SOLN
INTRAMUSCULAR | Status: DC | PRN
  Administered 2016-01-19 (×2): 1 mg via INTRAVENOUS

## 2016-01-19 MED ORDER — IOPAMIDOL (ISOVUE-370) INJECTION 76%
INTRAVENOUS | Status: AC
  Filled 2016-01-19: qty 50

## 2016-01-19 MED ORDER — HYDRALAZINE HCL 20 MG/ML IJ SOLN
10.0000 mg | Freq: Once | INTRAMUSCULAR | Status: AC
  Administered 2016-01-19: 15:00:00 10 mg via INTRAVENOUS
  Filled 2016-01-19: qty 1

## 2016-01-19 SURGICAL SUPPLY — 17 items
BALLN EMERGE MR 2.5X15 (BALLOONS) ×3
BALLN ~~LOC~~ TREK RX 3.0X15 (BALLOONS) ×3
BALLOON EMERGE MR 2.5X15 (BALLOONS) ×2 IMPLANT
BALLOON ~~LOC~~ TREK RX 3.0X15 (BALLOONS) ×2 IMPLANT
CATH INFINITI 5FR MULTPACK ANG (CATHETERS) ×3 IMPLANT
GUIDE CATH RUNWAY 6FR CLS3.5 (CATHETERS) ×3 IMPLANT
KIT ENCORE 26 ADVANTAGE (KITS) ×3 IMPLANT
KIT HEART LEFT (KITS) ×3 IMPLANT
PACK CARDIAC CATHETERIZATION (CUSTOM PROCEDURE TRAY) ×3 IMPLANT
SHEATH PINNACLE 5F 10CM (SHEATH) ×3 IMPLANT
SHEATH PINNACLE 6F 10CM (SHEATH) ×3 IMPLANT
STENT SYNERGY DES 2.5X28 (Permanent Stent) ×3 IMPLANT
TRANSDUCER W/STOPCOCK (MISCELLANEOUS) ×3 IMPLANT
TUBING CIL FLEX 10 FLL-RA (TUBING) ×3 IMPLANT
VALVE GUARDIAN II ~~LOC~~ HEMO (MISCELLANEOUS) ×3 IMPLANT
WIRE EMERALD 3MM-J .035X150CM (WIRE) ×3 IMPLANT
WIRE MARVEL STR TIP 190CM (WIRE) ×3 IMPLANT

## 2016-01-19 NOTE — Progress Notes (Signed)
Pt copay will be $45- prior auth not required for Brilinta

## 2016-01-19 NOTE — Interval H&P Note (Signed)
History and Physical Interval Note:  01/19/2016 8:26 AM  [name]  has presented today for surgery, with the diagnosis of NSTEMI   The various methods of treatment have been discussed with the patient and family. After consideration of risks, benefits and other options for treatment, the patient has consented to  Procedure(s): Left Heart Cath and Coronary Angiography (N/A) and possible coronary angioplasty as a surgical intervention .  The patient's history has been reviewed, patient examined, no change in status, stable for surgery.  I have reviewed the patient's chart and labs.  Questions were answered to the patient's satisfaction.     [name]  Cath Lab Visit (complete for each Cath Lab visit)  Clinical Evaluation Leading to the Procedure:   ACS: Yes.    Non-ACS:    Anginal Classification: CCS IV  Anti-ischemic medical therapy: Minimal Therapy (1 class of medications)  Non-Invasive Test Results: No non-invasive testing performed  Prior CABG: No previous CABG

## 2016-01-19 NOTE — H&P (View-Only) (Signed)
Subjective:  Patient is pain-free today.  He has mild elevation of troponin and new EKG changes with chest pain suggestive of ischemia.  He gives a history of having had a heart cath at [facility] a number of years ago.  Objective:  Vital Signs in the last 24 hours: BP 124/64   Pulse 85   Temp 97.8 F (36.6 C) (Oral)   Resp (!) 21   Ht 5\' 9"  (1.753 m)   Wt 69.9 kg (154 lb 1.6 oz)   SpO2 96%   BMI 22.76 kg/m   Physical Exam: Pleasant male in no acute distress Lungs:  Clear Cardiac:  Regular rhythm, normal S1 and S2, no S3 Abdomen:  Soft, nontender, no masses Extremities:  No edema present, radial pulse present on the right  Intake/Output from previous day: 10/07 0701 - 10/08 0700 In: 365 [P.O.:240; I.V.:125] Out: 150 [Urine:150]  Weight Filed Weights   01/17/16 1143 01/17/16 2029 01/18/16 0505  Weight: 72.6 kg (160 lb) 69.6 kg (153 lb 6.4 oz) 69.9 kg (154 lb 1.6 oz)    Lab Results: Basic Metabolic Panel:  Recent Labs  01/17/16 1148 01/18/16 0535  NA 141 136  K 4.8 3.7  CL 104 100*  CO2 27 26  GLUCOSE 130* 126*  BUN 9 12  CREATININE 0.89 0.75   CBC:  Recent Labs  01/17/16 1148 01/18/16 0535  WBC 9.5 8.1  HGB 17.5* 14.1  HCT 51.2 42.3  MCV 85.8 85.1  PLT 226 211   Cardiac Enzymes: Troponin (Point of Care Test)  Recent Labs  01/17/16 1157  TROPIPOC 0.06   Cardiac Panel (last 3 results)  Recent Labs  01/17/16 1832 01/18/16 0018 01/18/16 0535  TROPONINI 0.14* 0.16* 0.17*    Telemetry: Sinus rhythm  Assessment/Plan:  1.  Non-STEMI-he has been started on heparin and aspirin atorvastatin beta blockers and is on IV nitroglycerin.  With recurrent chest pain recommend cardiac catheterization for further evaluation.  Cardiac catheterization was discussed with the patient fully including risks of myocardial infarction, death, stroke, bleeding, arrhythmia, dye allergy, renal insufficiency or bleeding.  The patient understands and is willing to proceed.   Possibility of intervention at the same time also discussed with patient and they understand and are agreeable to proceed.  Discussed radial approach. 2.  Hypertension controlled 3.  Hyperlipidemia under treatment       W. Doristine Church  MD [facility] Card[facility]  01/18/2016, 9:51 AM

## 2016-01-19 NOTE — Progress Notes (Signed)
Site area: right groin  Site Prior to Removal:  Level 0  Pressure Applied For 20 MINUTES    Minutes Beginning at 1530  Manual:   Yes.    Patient Status During Pull:  AAO X 4  Post Pull Groin Site:  Level 0  Post Pull Instructions Given:  Yes.    Post Pull Pulses Present:  Yes.    Dressing Applied:  Yes.    Comments:  TOLERATED PROCEDURE WELL

## 2016-01-19 NOTE — Interval H&P Note (Signed)
History and Physical Interval Note:  01/19/2016 8:25 AM  [name]  has presented today for surgery, with the diagnosis of NSTEMI  The various methods of treatment have been discussed with the patient and family. After consideration of risks, benefits and other options for treatment, the patient has consented to  Procedure(s): Left Heart Cath and Coronary Angiography (N/A) as a surgical intervention .  The patient's history has been reviewed, patient examined, no change in status, stable for surgery.  I have reviewed the patient's chart and labs.  Questions were answered to the patient's satisfaction.   Cath Lab Visit (complete for each Cath Lab visit)  Clinical Evaluation Leading to the Procedure:   ACS: Yes.    Non-ACS:    Anginal Classification: CCS IV  Anti-ischemic medical therapy: Minimal Therapy (1 class of medications)  Non-Invasive Test Results: No non-invasive testing performed  Prior CABG: No previous CABG        Bensimhon, Quillian Quince

## 2016-01-19 NOTE — Care Management Note (Addendum)
Case Management Note  Patient Details  Name: Kimmy Pipitone MRN: [id number] Date of Birth: Nov 07, 1940  Subjective/Objective:    S/p coronary stent intervention, future med is brilinta, NCM awaiting benefit check.  Patient uses a single point cane at home, he also has a rolling waliker and a w/chair.  Patient is indep pta, lives with wife, NCM gave him 30 day free card, and benefit amount for co pay is s$45.00.  Goes to [facility], they do not have in [place], patient also goes to [facility] in [place] on [address] and they do have [facility] in [place].   NCM will cont to follow for dc needs                 Action/Plan:   Expected Discharge Date:                  Expected Discharge Plan:  [place]  In-House Referral:     Discharge planning Services  CM Consult  Post Acute Care Choice:    Choice offered to:     DME Arranged:    DME Agency:     HH Arranged:    HH Agency:     Status of Service:  In process, will continue to follow  If discussed at Long Length of Stay Meetings, dates discussed:    Additional Comments:  Zenon Mayo, RN 01/19/2016, 2:31 PM

## 2016-01-19 NOTE — Progress Notes (Signed)
   01/19/16 1245  Clinical Encounter Type  Visited With Patient and family together  Visit Type Other (Comment) (consult)  Referral From Family  Consult/Referral To Chaplain  Spiritual Encounters  Spiritual Needs Prayer;Other (Comment) (AD)  Stress Factors  Patient Stress Factors Major life changes  Family Stress Factors Family relationships  AD delivered to family and Pt. Wife will come back tomorrow to complete form. Offered prayer.

## 2016-01-20 ENCOUNTER — Telehealth: Payer: Self-pay | Admitting: Cardiology

## 2016-01-20 DIAGNOSIS — Z87891 Personal history of nicotine dependence: Secondary | ICD-10-CM | POA: Diagnosis not present

## 2016-01-20 DIAGNOSIS — I471 Supraventricular tachycardia: Secondary | ICD-10-CM | POA: Diagnosis not present

## 2016-01-20 DIAGNOSIS — I2510 Atherosclerotic heart disease of native coronary artery without angina pectoris: Secondary | ICD-10-CM | POA: Diagnosis not present

## 2016-01-20 DIAGNOSIS — G250 Essential tremor: Secondary | ICD-10-CM | POA: Diagnosis not present

## 2016-01-20 DIAGNOSIS — E119 Type 2 diabetes mellitus without complications: Secondary | ICD-10-CM | POA: Diagnosis not present

## 2016-01-20 DIAGNOSIS — R079 Chest pain, unspecified: Secondary | ICD-10-CM | POA: Diagnosis not present

## 2016-01-20 DIAGNOSIS — Z955 Presence of coronary angioplasty implant and graft: Secondary | ICD-10-CM | POA: Diagnosis not present

## 2016-01-20 DIAGNOSIS — E785 Hyperlipidemia, unspecified: Secondary | ICD-10-CM | POA: Diagnosis not present

## 2016-01-20 DIAGNOSIS — I214 Non-ST elevation (NSTEMI) myocardial infarction: Secondary | ICD-10-CM | POA: Diagnosis not present

## 2016-01-20 DIAGNOSIS — Z79899 Other long term (current) drug therapy: Secondary | ICD-10-CM | POA: Diagnosis not present

## 2016-01-20 DIAGNOSIS — I10 Essential (primary) hypertension: Secondary | ICD-10-CM | POA: Diagnosis not present

## 2016-01-20 DIAGNOSIS — I4729 Other ventricular tachycardia: Secondary | ICD-10-CM

## 2016-01-20 DIAGNOSIS — J449 Chronic obstructive pulmonary disease, unspecified: Secondary | ICD-10-CM | POA: Diagnosis not present

## 2016-01-20 DIAGNOSIS — G40909 Epilepsy, unspecified, not intractable, without status epilepticus: Secondary | ICD-10-CM | POA: Diagnosis not present

## 2016-01-20 DIAGNOSIS — I472 Ventricular tachycardia: Secondary | ICD-10-CM

## 2016-01-20 LAB — CBC
HCT: 46.3 % (ref 39.0–52.0)
HEMOGLOBIN: 15.4 g/dL (ref 13.0–17.0)
MCH: 28.6 pg (ref 26.0–34.0)
MCHC: 33.3 g/dL (ref 30.0–36.0)
MCV: 86.1 fL (ref 78.0–100.0)
PLATELETS: 211 10*3/uL (ref 150–400)
RBC: 5.38 MIL/uL (ref 4.22–5.81)
RDW: 13.6 % (ref 11.5–15.5)
WBC: 9.3 10*3/uL (ref 4.0–10.5)

## 2016-01-20 LAB — BASIC METABOLIC PANEL
ANION GAP: 11 (ref 5–15)
BUN: 8 mg/dL (ref 6–20)
CHLORIDE: 103 mmol/L (ref 101–111)
CO2: 23 mmol/L (ref 22–32)
Calcium: 8.9 mg/dL (ref 8.9–10.3)
Creatinine, Ser: 0.84 mg/dL (ref 0.61–1.24)
GFR calc Af Amer: >60 mL/min (ref >60)
Glucose, Bld: 172 mg/dL — ABNORMAL HIGH (ref 65–99)
POTASSIUM: 3.7 mmol/L (ref 3.5–5.1)
SODIUM: 137 mmol/L (ref 135–145)

## 2016-01-20 LAB — GLUCOSE, CAPILLARY
Glucose-Capillary: 162 mg/dL — ABNORMAL HIGH (ref 65–99)
Glucose-Capillary: 124 mg/dL — ABNORMAL HIGH (ref 65–99)
Glucose-Capillary: 126 mg/dL — ABNORMAL HIGH (ref 65–99)
Glucose-Capillary: 146 mg/dL — ABNORMAL HIGH (ref 65–99)

## 2016-01-20 MED ORDER — METOPROLOL SUCCINATE ER 50 MG PO TB24
50.0000 mg | ORAL_TABLET | Freq: Every day | ORAL | Status: DC
  Administered 2016-01-20 – 2016-01-21 (×2): 50 mg via ORAL
  Filled 2016-01-20 (×2): qty 1

## 2016-01-20 MED ORDER — LOSARTAN POTASSIUM 50 MG PO TABS
25.0000 mg | ORAL_TABLET | Freq: Every day | ORAL | Status: DC
  Administered 2016-01-20 – 2016-01-21 (×2): 25 mg via ORAL
  Filled 2016-01-20 (×2): qty 1

## 2016-01-20 NOTE — Telephone Encounter (Signed)
New Message  [name] appt with Ronal Fear on Wednesday, 01/28/2016, 8:00 am [name] from hospital.

## 2016-01-20 NOTE — Progress Notes (Signed)
CARDIAC REHAB PHASE I   PRE:  Rate/Rhythm: 12 SR  BP:  Sitting: 156/79        SaO2: 94 RA  MODE:  Ambulation: 40 ft   POST:  Rate/Rhythm: 98 SR  BP:  Sitting: 176/75         SaO2: 96 RA  Pt ambulated 40 ft on RA, cane, gait belt, assist x1, mildly unsteady gait at baseline, tolerated fairly well, denies any complaints. Pt at baseline for activity, limited by severe Parkinson's, pt and spouse decline PT services at this time. Completed MI/stent education with pt and wife at bedside.  Reviewed risk factors, MI book, anti-platelet therapy, stent card, activity restrictions, ntg, exercise, heart healthy diet, and phase 2 cardiac rehab. Pt and wife verbalized understanding, receptive to education. Pt agrees to phase 2 cardiac rehab referral, will send to [facility] per pt request, however, pt states that he will likely be unable to participate due to his activity limitations. Pt to edge of bed per pt request after walk, call bell within reach. Will follow.   [place], RN, BSN 01/20/2016 9:27 AM

## 2016-01-20 NOTE — Progress Notes (Signed)
Patient Name: [name] Date of Encounter: 01/20/2016  Hospital Problem List     Active Problems:   Chest pain at rest   NSTEMI (non-ST elevated myocardial infarction) [facility])    Subjective   Feeling well this morning. No anginal symptoms.   Inpatient Medications    . aspirin  81 mg Oral Daily  . atorvastatin  40 mg Oral q1800  . carbidopa-levodopa  1 tablet Oral TID  . metoprolol tartrate  12.5 mg Oral BID  . mometasone-formoterol  2 puff Inhalation BID  . sodium chloride flush  3 mL Intravenous Q12H  . sodium chloride flush  3 mL Intravenous Q12H  . ticagrelor  90 mg Oral BID    Vital Signs    Vitals:   01/19/16 1700 01/19/16 2000 01/19/16 2043 01/20/16 0629  BP: (!) 159/71  (!) 157/66 (!) 161/71  Pulse: 88 98 93 86  Resp: (!) 24 20 (!) 29 (!) 27  Temp: 98.2 F (36.8 C)  98.1 F (36.7 C) 98.5 F (36.9 C)  TempSrc: Oral  Oral Oral  SpO2: 100% 97% 96% 96%  Weight:    152 lb 1.9 oz (69 kg)  Height:        Intake/Output Summary (Last 24 hours) at 01/20/16 0700 Last data filed at 01/20/16 0636  Gross per 24 hour  Intake                0 ml  Output             1200 ml  Net            -1200 ml   Filed Weights   01/18/16 0505 01/19/16 0400 01/20/16 0629  Weight: 154 lb 1.6 oz (69.9 kg) 151 lb 4.8 oz (68.6 kg) 152 lb 1.9 oz (69 kg)    Physical Exam    General: Pleasant older thin male, NAD. Neuro: Alert and oriented X 3. Moves all extremities spontaneously. Psych: Normal affect. HEENT:  Normal  Neck: Supple without bruits or JVD. Lungs:  Resp regular and unlabored, Expiratory wheezing bilaterally. Heart: RRR no s3, s4, or murmurs. Abdomen: Soft, non-tender, non-distended, BS + x 4.  Extremities: No clubbing, cyanosis or edema. DP/PT/Radials 2+ and equal bilaterally. Right fem site stable without bruising or hematoma.  Labs    CBC  Recent Labs  01/18/16 0535 01/19/16 0500  WBC 8.1 8.3  HGB 14.1 14.8  HCT 42.3 44.0  MCV 85.1 85.3    PLT 211 [id number]   Basic Metabolic Panel  Recent Labs  01/17/16 1148 01/17/16 1832 01/18/16 0535  NA 141  --  136  K 4.8  --  3.7  CL 104  --  100*  CO2 27  --  26  GLUCOSE 130*  --  126*  BUN 9  --  12  CREATININE 0.89  --  0.75  CALCIUM 9.6  --  8.6*  MG  --  2.0  --    Liver Function Tests  Recent Labs  01/17/16 1832  AST 23  ALT 11*  ALKPHOS 95  BILITOT 1.1  PROT 6.7  ALBUMIN 3.8   No results for input(s): LIPASE, AMYLASE in the last 72 hours. Cardiac Enzymes  Recent Labs  01/17/16 1832 01/18/16 0018 01/18/16 0535  TROPONINI 0.14* 0.16* 0.17*   BNP Invalid input(s): POCBNP D-Dimer No results for input(s): DDIMER in the last 72 hours. Hemoglobin A1C  Recent Labs  01/18/16 0018  HGBA1C 5.6   Fasting  Lipid Panel No results for input(s): CHOL, HDL, LDLCALC, TRIG, CHOLHDL, LDLDIRECT in the last 72 hours. Thyroid Function Tests  Recent Labs  01/17/16 1832  TSH 1.647    Telemetry    SR, Run of SVT? this morning.   ECG    SR, no new findings  Radiology    Dg Chest 2 View  Result Date: 01/17/2016 CLINICAL DATA:  Chest pain for 2 days.  Nausea. EXAM: CHEST  2 VIEW COMPARISON:  December 14, 2015 FINDINGS: There is no edema or consolidation. Heart size and pulmonary vascularity are normal. No adenopathy. There is atherosclerotic calcification in the aorta. No bone lesions are evident. IMPRESSION: Aortic atherosclerosis.  No edema or consolidation. Electronically Signed   By: Lowella Grip III M.D.   On: 01/17/2016 13:40     Assessment & Plan    75 y.o.malewith medical history significant for hypertension, hyperlipidemia, COPD who presented to the ER with chest pain.  1. NSTEMI: Previously presented to the ED and was admitting undergoing nuc that showed no ischemia with prior infarct. His echocardiogram showed normal LVEF grade 1 diastolic dysfunction. No significant valvular abnormality. Appears he was discharged home without follow up.  Presented back to the ED on 10/7 with reports of left sided chest pain with trop 0.09 that peaked at 0.17. EKG in the ED showed new neg ST depression in the inferolateral leads. Underwent LHC yesterday with Dr. Haroldine Laws with 99% stenosed OM-1 treated with DES x1. Placed on ASA and Brilinta post cath. Morning labs pending.   2. HTN: Hypertensive over last couple of readings. Could increase BB as he had some episodes of tachycardia on telemetry. No significant bradycardia noted.   3. HLD: On statin  4. Tachycardia, SVT?: Reports he is symptomatic with these episodes. Has been started on BB, room to possibly increase.   Signed, Reino Bellis NP-C Pager [phone]

## 2016-01-21 ENCOUNTER — Other Ambulatory Visit: Payer: Self-pay | Admitting: Cardiology

## 2016-01-21 DIAGNOSIS — I214 Non-ST elevation (NSTEMI) myocardial infarction: Secondary | ICD-10-CM | POA: Diagnosis not present

## 2016-01-21 DIAGNOSIS — I10 Essential (primary) hypertension: Secondary | ICD-10-CM | POA: Diagnosis not present

## 2016-01-21 DIAGNOSIS — I472 Ventricular tachycardia: Secondary | ICD-10-CM | POA: Diagnosis not present

## 2016-01-21 DIAGNOSIS — R079 Chest pain, unspecified: Secondary | ICD-10-CM | POA: Diagnosis not present

## 2016-01-21 DIAGNOSIS — I471 Supraventricular tachycardia: Secondary | ICD-10-CM

## 2016-01-21 DIAGNOSIS — Z955 Presence of coronary angioplasty implant and graft: Secondary | ICD-10-CM | POA: Diagnosis not present

## 2016-01-21 LAB — CBC
HCT: 46.2 % (ref 39.0–52.0)
HEMOGLOBIN: 15.8 g/dL (ref 13.0–17.0)
MCH: 28.7 pg (ref 26.0–34.0)
MCHC: 34.2 g/dL (ref 30.0–36.0)
MCV: 84 fL (ref 78.0–100.0)
Platelets: 260 10*3/uL (ref 150–400)
RBC: 5.5 MIL/uL (ref 4.22–5.81)
RDW: 13.7 % (ref 11.5–15.5)
WBC: 12.7 10*3/uL — ABNORMAL HIGH (ref 4.0–10.5)

## 2016-01-21 LAB — GLUCOSE, CAPILLARY: Glucose-Capillary: 128 mg/dL — ABNORMAL HIGH (ref 65–99)

## 2016-01-21 MED ORDER — NITROGLYCERIN 0.4 MG SL SUBL: 0.4000 mg | SUBLINGUAL_TABLET | SUBLINGUAL | 3 refills | Status: AC | PRN

## 2016-01-21 MED ORDER — ATORVASTATIN CALCIUM 40 MG PO TABS: 40.0000 mg | ORAL_TABLET | Freq: Every day | ORAL | 6 refills | Status: DC

## 2016-01-21 MED ORDER — TICAGRELOR 90 MG PO TABS: 90.0000 mg | ORAL_TABLET | Freq: Two times a day (BID) | ORAL | 10 refills | Status: DC

## 2016-01-21 MED ORDER — METOPROLOL SUCCINATE ER 50 MG PO TB24: 50.0000 mg | ORAL_TABLET | Freq: Every day | ORAL | 6 refills | Status: DC

## 2016-01-21 MED ORDER — LOSARTAN POTASSIUM 25 MG PO TABS: 25.0000 mg | ORAL_TABLET | Freq: Every day | ORAL | 6 refills | Status: DC

## 2016-01-21 MED ORDER — TICAGRELOR 90 MG PO TABS: 90.0000 mg | ORAL_TABLET | Freq: Two times a day (BID) | ORAL | 0 refills | Status: DC

## 2016-01-21 NOTE — Care Management Note (Signed)
Case Management Note  Patient Details  Name: Montavious Debates MRN: [id number] Date of Birth: 1940/04/19  Subjective/Objective:     S/p coronary stent intervention, future med is brilinta,   Patient uses a single point cane at home, he also has a rolling waliker and a w/chair.  Patient is indep pta per patient, lives with wife, NCM gave him 30 day free card, and benefit amount for co pay is s$45.00.  Goes to [facility], they do not have in [place], patient also goes to [facility] in [place] on [address] and they do have [facility] in [place].   Per wife they do not want HHPT.  Patient is for dc today.                             Action/Plan:   Expected Discharge Date:                  Expected Discharge Plan:  [place]  In-House Referral:     Discharge planning Services  CM Consult  Post Acute Care Choice:    Choice offered to:     DME Arranged:    DME Agency:     HH Arranged:    [place] Agency:     Status of Service:  Completed, signed off  If discussed at [employer] of Stay Meetings, dates discussed:    Additional Comments:  Zenon Mayo, RN 01/21/2016, 10:54 AM

## 2016-01-21 NOTE — Discharge Summary (Signed)
Discharge Summary    Patient ID: [name],  MRN: [id number], DOB/AGE: 12/20/1940 75 y.o.  Admit date: 01/17/2016 Discharge date: 01/21/2016  Primary Care Provider: Sheral Flow Primary Cardiologist: Kathrin Ruddy)  Discharge Diagnoses    Active Problems:   Chest pain   NSTEMI (non-ST elevated myocardial infarction) [facility])   NSVT (nonsustained ventricular tachycardia) (HCC)   Status post coronary artery stent placement   SVT (supraventricular tachycardia) (HCC)   Allergies Allergies  Allergen Reactions  . Levaquin [Levofloxacin In D5w] Nausea And Vomiting  . Penicillins Hives    Childhood allergic reaction per spouse - no other information available 01/18/16    Diagnostic Studies/Procedures    LHC: 01/19/16  Conclusion     Ost Cx lesion, 30 %stenosed.  1st Mrg lesion, 99 %stenosed.  Dist LAD-2 lesion, 30 %stenosed.  Dist LAD-1 lesion, 60 %stenosed.  Mid LAD lesion, 30 %stenosed.  Prox RCA to Mid RCA lesion, 40 %stenosed.  Mid RCA to Dist RCA lesion, 20 %stenosed.  The left ventricular ejection fraction is 50-55% by visual estimate.   Assessment:  1. CAD with high-grade lesion in OM-1 with non-obstructive disease in LAD and RCA 2. LVEF 50-55% with hypokinesis in the mid-inferior wall 3. LVEDP = 15  Plan/Discussion:  Initially the OM-1 was completely occluded but on follow-up angiograms it re-perfused and showed a high-grade 99% lesion in the midsection. Plan PCI of OM-1 with Dr. Irish Lack. Will need aggressive medical management of CAD in LAD and RCA.   Bensimhon, Daniel,MD 9:08 AM    _____________   History of Present Illness     75 y.o.malewith medical history significant for hypertension, hyperlipidemia, COPD who presented to the ER with chest pain, troponin elevated to 0.27, he underwent stress testing that showed no ischemia but prior infarct. He was discharged home with no follow-up. His echocardiogram showed  normal LVEF grade 1 diastolic dysfunction. No significant valvular abnormality.  The patient presented back with left-sided chest pain associated with nausea and shortness of breath, that started the day before and was persistent. The severity fluctuates, troponin was 0.09 in the ED with normal creatinine and normal hemoglobin. The patient has ongoing chest pain, his EKG shows sinus rhythm and new negative ST depressions in the inferolateral leads.  Hospital Course     Consultants: None  He was admitted as a NSTEMI, started on a nitro/heparin drip along with low dose coreg 3.125mg  BID. Also placed on asa and atorvastatin 40mg . He was observed over the weekend and prepped for [facility] on Monday. Underwent LHC with Dr. Haroldine Laws showing 99% stenosed OM-1 treated with DES x1 by Dr. Irish Lack. EF was noted at 50%-55%. He was continued on ASA and Brilinta was added post cath. Appears that he was taking home dose of metoprolol and this was restarted prior to cath. Post cath he did have episodes of what appeared to be both SVT and NSVT. He did report being symptomatic with this. His metoprolol was changed to Toprol XL, and losartan added for better blood pressure control. He was observed an additional night given telemetry findings and medication changes.   The following day telemetry showed another brief episode of SVT and 6 beat NSVT, though he was not symptomatic this time. His Toprol XL was increased to 50mg . Given findings on telemetry, will discharge home with plans for outpatient event monitor. Dr. Debara Pickett discussed with Dr. Baird Kay who felt this was appropriate.   He was seen and assessed by Dr. Debara Pickett on  10/11 and determined stable for discharge home. I have arranged for follow up in the office with an APP and for him to have cardiac event monitor placed.    _____________  Discharge Vitals Blood pressure 130/64, pulse 93, temperature 98.2 F (36.8 C), temperature source Oral, resp. rate (!) 27, height  5\' 9"  (1.753 m), weight 148 lb 2.4 oz (67.2 kg), SpO2 97 %.  Filed Weights   01/19/16 0400 01/20/16 0629 01/21/16 0654  Weight: 151 lb 4.8 oz (68.6 kg) 152 lb 1.9 oz (69 kg) 148 lb 2.4 oz (67.2 kg)    Labs & Radiologic Studies    CBC  Recent Labs  01/20/16 0541 01/21/16 0211  WBC 9.3 12.7*  HGB 15.4 15.8  HCT 46.3 46.2  MCV 86.1 84.0  PLT 211 123456   Basic Metabolic Panel  Recent Labs  01/20/16 0541  NA 137  K 3.7  CL 103  CO2 23  GLUCOSE 172*  BUN 8  CREATININE 0.84  CALCIUM 8.9   Liver Function Tests No results for input(s): AST, ALT, ALKPHOS, BILITOT, PROT, ALBUMIN in the last 72 hours. No results for input(s): LIPASE, AMYLASE in the last 72 hours. Cardiac Enzymes No results for input(s): CKTOTAL, CKMB, CKMBINDEX, TROPONINI in the last 72 hours. BNP Invalid input(s): POCBNP D-Dimer No results for input(s): DDIMER in the last 72 hours. Hemoglobin A1C No results for input(s): HGBA1C in the last 72 hours. Fasting Lipid Panel No results for input(s): CHOL, HDL, LDLCALC, TRIG, CHOLHDL, LDLDIRECT in the last 72 hours. Thyroid Function Tests No results for input(s): TSH, T4TOTAL, T3FREE, THYROIDAB in the last 72 hours.  Invalid input(s): FREET3 _____________  Dg Chest 2 View  Result Date: 01/17/2016 CLINICAL DATA:  Chest pain for 2 days.  Nausea. EXAM: CHEST  2 VIEW COMPARISON:  December 14, 2015 FINDINGS: There is no edema or consolidation. Heart size and pulmonary vascularity are normal. No adenopathy. There is atherosclerotic calcification in the aorta. No bone lesions are evident. IMPRESSION: Aortic atherosclerosis.  No edema or consolidation. Electronically Signed   By: Lowella Grip III M.D.   On: 01/17/2016 13:40   Disposition   Pt is being discharged home today in good condition.  Follow-up Plans & Appointments    Follow-up Information    Lyda Jester, PA-C Follow up on 01/28/2016.   Specialties:  Cardiology, Radiology Why:  at 8am for you  hospital follow up. Contact information: [address] [place] [address] [phone]        [place] Office Follow up on 02/02/2016.   Specialty:  Cardiology Why:  at 12:30 to pick up your heart monitor.  Contact information: [address], [place] [phone]         Discharge Instructions    Amb Referral to Cardiac Rehabilitation    Complete by:  As directed    Diagnosis:   NSTEMI Coronary Stents     Diet - low sodium heart healthy    Complete by:  As directed    Discharge instructions    Complete by:  As directed    Groin Site Care Refer to this sheet in the next few weeks. These instructions provide you with information on caring for yourself after your procedure. Your caregiver may also give you more specific instructions. Your treatment has been planned according to current medical practices, but problems sometimes occur. Call your caregiver if you have any problems or questions after  your procedure. HOME CARE INSTRUCTIONS You may shower 24 hours after the procedure. Remove the bandage (dressing) and gently wash the site with plain soap and water. Gently pat the site dry.  Do not apply powder or lotion to the site.  Do not sit in a bathtub, swimming pool, or whirlpool for 5 to 7 days.  No bending, squatting, or lifting anything over 10 pounds (4.5 kg) as directed by your caregiver.  Inspect the site at least twice daily.  Do not drive home if you are discharged the same day of the procedure. Have someone else drive you.  You may drive 24 hours after the procedure unless otherwise instructed by your caregiver.  What to expect: Any bruising will usually fade within 1 to 2 weeks.  Blood that collects in the tissue (hematoma) may be painful to the touch. It should usually decrease in size and tenderness within 1 to 2 weeks.  SEEK IMMEDIATE MEDICAL CARE IF: You have unusual pain at the groin site or  down the affected leg.  You have redness, warmth, swelling, or pain at the groin site.  You have drainage (other than a small amount of blood on the dressing).  You have chills.  You have a fever or persistent symptoms for more than 72 hours.  You have a fever and your symptoms suddenly get worse.  Your leg becomes pale, cool, tingly, or numb.  You have heavy bleeding from the site. Hold pressure on the site. .   Increase activity slowly    Complete by:  As directed       Discharge Medications   Current Discharge Medication List    START taking these medications   Details  atorvastatin (LIPITOR) 40 MG tablet Take 1 tablet (40 mg total) by mouth daily at 6 PM. Qty: 30 tablet, Refills: 6    losartan (COZAAR) 25 MG tablet Take 1 tablet (25 mg total) by mouth daily. Qty: 30 tablet, Refills: 6    metoprolol succinate (TOPROL-XL) 50 MG 24 hr tablet Take 1 tablet (50 mg total) by mouth daily. Take with or immediately following a meal. Qty: 30 tablet, Refills: 6    nitroGLYCERIN (NITROSTAT) 0.4 MG SL tablet Place 1 tablet (0.4 mg total) under the tongue every 5 (five) minutes x 3 doses as needed for chest pain. Qty: 25 tablet, Refills: 3    ticagrelor (BRILINTA) 90 MG TABS tablet Take 1 tablet (90 mg total) by mouth 2 (two) times daily. Qty: 60 tablet, Refills: 0      CONTINUE these medications which have NOT CHANGED   Details  acetaminophen (TYLENOL) 500 MG tablet Take 500 mg by mouth every 6 (six) hours as needed for headache (pain).    albuterol (PROVENTIL HFA;VENTOLIN HFA) 108 (90 Base) MCG/ACT inhaler Inhale 2 puffs into the lungs every 6 (six) hours as needed for wheezing or shortness of breath. Qty: 1 Inhaler, Refills: 2   Associated Diagnoses: Shortness of breath    aspirin EC 81 MG EC tablet Take 1 tablet (81 mg total) by mouth daily. Qty: 30 tablet, Refills: 0    carbidopa-levodopa (SINEMET IR) 25-100 MG tablet Take 1 tablet by mouth 3 (three) times daily. Qty: 90  tablet, Refills: 2    Fluticasone-Salmeterol (ADVAIR DISKUS) 250-50 MCG/DOSE AEPB Inhale 1 puff into the lungs every 12 (twelve) hours. Qty: 60 each, Refills: 5   Associated Diagnoses: Chronic obstructive pulmonary disease, unspecified COPD type (HCC)    imiquimod (ALDARA) 5 % cream Apply  1 application topically 2 (two) times daily.    levETIRAcetam (KEPPRA) 500 MG tablet Take 1 tablet (500 mg total) by mouth 2 (two) times daily. Qty: 60 tablet, Refills: 2      STOP taking these medications     metoprolol tartrate (LOPRESSOR) 25 MG tablet          Aspirin prescribed at discharge?  Yes High Intensity Statin Prescribed? (Lipitor 40-80mg  or Crestor 20-40mg ): Yes Beta Blocker Prescribed? Yes For EF <40%, was ACEI/ARB Prescribed? Yes ADP Receptor Inhibitor Prescribed? (i.e. Plavix etc.-Includes Medically Managed Patients): Yes For EF <40%, Aldosterone Inhibitor Prescribed? No: EF ok Was EF assessed during THIS hospitalization? Yes Was Cardiac Rehab II ordered? (Included Medically managed Patients): Yes   Outstanding Labs/Studies   LFTs and FLP  Duration of Discharge Encounter   Greater than 30 minutes including physician time.  Signed, Reino Bellis NP-C 01/21/2016, 11:44 AM

## 2016-01-21 NOTE — Care Management Important Message (Signed)
Important Message  Patient Details  Name: [name] MRN: [id number] Date of Birth: 03-10-1941   Medicare Important Message Given:  Yes    Orbie Pyo 01/21/2016, 10:53 AM

## 2016-01-21 NOTE — Progress Notes (Signed)
CARDIAC REHAB PHASE I   PRE:  Rate/Rhythm: 95 SR  BP:  Sitting: 130/64        SaO2: 95 RA  MODE:  Ambulation: 56 ft   POST:  Rate/Rhythm: 92 SR  BP:  Sitting: 137/63         SaO2: 97 RA  Pt ambulated 56 ft on RA, cane, gait belt, assist x1, mildly unsteady gait at baseline, tolerated well with no complaints. Pt more steady today, less fatigued, very eager to go home to his dachshund. Pt and wife state they have no questions regarding education at this time. Pt to edge of bed per pt request after walk, call bell within reach.  [place], RN, BSN 01/21/2016 8:40 AM

## 2016-01-21 NOTE — Progress Notes (Signed)
Patient Name: [name] Date of Encounter: 01/21/2016  Hospital Problem List     Active Problems:   Chest pain   NSTEMI (non-ST elevated myocardial infarction) [facility])   NSVT (nonsustained ventricular tachycardia) (HCC)    Subjective   No chest pain or dyspnea. Arrhthymias noted on telemetry, but he reports being asleep and doesn't recall feeling anything.   Inpatient Medications    . aspirin  81 mg Oral Daily  . atorvastatin  40 mg Oral q1800  . carbidopa-levodopa  1 tablet Oral TID  . losartan  25 mg Oral Daily  . metoprolol succinate  50 mg Oral Daily  . mometasone-formoterol  2 puff Inhalation BID  . sodium chloride flush  3 mL Intravenous Q12H  . sodium chloride flush  3 mL Intravenous Q12H  . ticagrelor  90 mg Oral BID    Vital Signs    Vitals:   01/20/16 1949 01/20/16 2000 01/20/16 2057 01/21/16 0654  BP: (!) 169/82   138/68  Pulse: 80  87 83  Resp: (!) 26 (!) 22 18 (!) 32  Temp: 98.1 F (36.7 C)   98.6 F (37 C)  TempSrc: Oral   Oral  SpO2: 96% 96% 97% 96%  Weight:    148 lb 2.4 oz (67.2 kg)  Height:        Intake/Output Summary (Last 24 hours) at 01/21/16 0759 Last data filed at 01/21/16 0655  Gross per 24 hour  Intake              360 ml  Output              525 ml  Net             -165 ml   Filed Weights   01/19/16 0400 01/20/16 0629 01/21/16 0654  Weight: 151 lb 4.8 oz (68.6 kg) 152 lb 1.9 oz (69 kg) 148 lb 2.4 oz (67.2 kg)    Physical Exam    General: Pleasant older thin male, NAD. Neuro: Alert and oriented X 3. Moves all extremities spontaneously. Psych: Normal affect. HEENT:  Normal  Neck: Supple without bruits or JVD. Lungs:  Resp regular and unlabored, Expiratory wheezing bilaterally. Heart: RRR no s3, s4, or murmurs. Abdomen: Soft, non-tender, non-distended, BS + x 4.  Extremities: No clubbing, cyanosis or edema. DP/PT/Radials 2+ and equal bilaterally. Right fem site stable without bruising or hematoma.  Labs     CBC  Recent Labs  01/20/16 0541 01/21/16 0211  WBC 9.3 12.7*  HGB 15.4 15.8  HCT 46.3 46.2  MCV 86.1 84.0  PLT 211 123456   Basic Metabolic Panel  Recent Labs  01/20/16 0541  NA 137  K 3.7  CL 103  CO2 23  GLUCOSE 172*  BUN 8  CREATININE 0.84  CALCIUM 8.9   Liver Function Tests No results for input(s): AST, ALT, ALKPHOS, BILITOT, PROT, ALBUMIN in the last 72 hours. No results for input(s): LIPASE, AMYLASE in the last 72 hours. Cardiac Enzymes No results for input(s): CKTOTAL, CKMB, CKMBINDEX, TROPONINI in the last 72 hours. BNP Invalid input(s): POCBNP D-Dimer No results for input(s): DDIMER in the last 72 hours. Hemoglobin A1C No results for input(s): HGBA1C in the last 72 hours. Fasting Lipid Panel No results for input(s): CHOL, HDL, LDLCALC, TRIG, CHOLHDL, LDLDIRECT in the last 72 hours. Thyroid Function Tests No results for input(s): TSH, T4TOTAL, T3FREE, THYROIDAB in the last 72 hours.  Invalid input(s): [employer]  Telemetry  SR, run of SVT this morning  ECG    SR, no new findings  Radiology    Dg Chest 2 View  Result Date: 01/17/2016 CLINICAL DATA:  Chest pain for 2 days.  Nausea. EXAM: CHEST  2 VIEW COMPARISON:  December 14, 2015 FINDINGS: There is no edema or consolidation. Heart size and pulmonary vascularity are normal. No adenopathy. There is atherosclerotic calcification in the aorta. No bone lesions are evident. IMPRESSION: Aortic atherosclerosis.  No edema or consolidation. Electronically Signed   By: Lowella Grip III M.D.   On: 01/17/2016 13:40    Assessment & Plan    75 y.o.malewith medical history significant for hypertension, hyperlipidemia, COPD who presented to the ER with chest pain.  1. NSTEMI: Previously presented to the ED and was admitting undergoing nuc that showed no ischemia with prior infarct. His echocardiogram showed normal LVEF grade 1 diastolic dysfunction. No significant valvular abnormality. Appears he was  discharged home without follow up. Presented back to the ED on 10/7 with reports of left sided chest pain with trop 0.09 that peaked at 0.17. EKG in the ED showed new neg ST depression in the inferolateral leads. Underwent LHC yesterday with Dr. Haroldine Laws with 99% stenosed OM-1 treated with DES x1. Placed on ASA and Brilinta post cath.   2. HTN: changed to Toprol XL yesterday, and added losartan 25mg . Blood pressure improved today.   3. HLD: On statin  4. Tachycardia, NSVT vs SVT with aberrancy: BB changed to Toprol XL yesterday. Did have 2 other episodes of what looks like SVT. No bradycardia therefore room to titrate up BB. Will likely need a monitor at dc.   Signed, Reino Bellis NP-C Pager [phone]

## 2016-01-22 ENCOUNTER — Telehealth: Payer: Self-pay | Admitting: Cardiology

## 2016-01-22 NOTE — Telephone Encounter (Signed)
Patient's wife [name]) contacted regarding discharge from [facility] on 01/21/2016.  Patient's wife understands to follow up with provider Ellen Henri, PA-C on 01/28/2016 at 0800 at [facility] office.  Patient's wife understands discharge instructions? Yes  Patient's wife understands medications and regiment? Yes: I reviewed discharge medication list in detail with patient's wife. She gave verbal understanding.  Patient's wifet understands to bring all medications to this visit? Yes  Patient's wife voiced thanks for call and medication review.

## 2016-01-22 NOTE — Telephone Encounter (Signed)
Pts wife is calling stating that the pt has been complaining of a stomach ache since started on his new cardiac medications, for yesterday's discharge from the hospital.  Wife states that the pt is having normal bowel movements daily, and has had no episodes of vomiting at all.  Wife reports the pt has no other complaints.  Wife was wondering if his cardiac meds will cause him to have a stomach ache, and if so, then may she give him dramamine for this.  Advised the pts wife that she should NOT give him any dramamine for this issue, or ever, unless advised by a Provider.  Asked the pts wife if he is eating while taking his cardiac meds.  Per the pts wife, she states she fed him a half of a piece of toast and a glass of milk for breakfast, then he took his cardiac meds.  Educated the pts wife that the pt should be taking a full, heart healthy breakfast in and then administer his meds to him thereafter.  Educated the pts wife that the pt should take Toprol XL with food or immediately after his meal consumption.  Informed the pts wife that he should avoid dairy (milk) for the time being, for this may upset his stomach more and is not a sufficient meal to cover his meds. Advised the pts wife that she must follow the directions on how to administer each medication to the pt, for it specifically says how and what time the pt should take each med. Advised the pts wife to make sure she is giving him his atorvastatin 40 mg po daily at 6 pm.  Not in the morning.  Pts wife states that she gave this to him in the morning and will start giving him this med at 6 pm, as prescribed and written on his bottle. Advised the pts wife to make sure his fluid intake is sufficient, to avoid dehydration.  Advised the pts wife that she may give him  ginger-ale with his meals, to sooth his stomach.  Advised the pts wife that if he becomes constipated, starts vomiting, has a distended stomach, runs a fever or has any other acute  GI or cardiac issues, then she should seek immediate help for the pt or call our office back.  Advised the pts wife that if his stomach is continuously hurting she should call us back, or contact the pts PCP.  Wife states that the pt received morphine while he was in the hospital, and could this be a contributing factor to his upset stomach.  Informed the pts wife that morphine can absolutely cause an upset stomach, for as we age, our livers do not metabolize this med properly, therefore causing prolonged side effects like "upset stomach." Advised the pts wife to continue monitoring the pts bowel movements, and report any abnormal findings.  Advised the pts wife to call Dr Doristine Devoid office to have them advise if his Sinemet is causing this issue as well.  Advised the pts wife to make sure to get him here for his follow-up appt for next Wednesday 10/18, and she may discuss this in more depth with Ellen Henri PA-C at that time.  Wife verbalized understanding, agrees with this plan, and gracious for all the assistance provided.  Will route this message to both Dr Meda Coffee and Ellen Henri PA-C, as a general FYI.

## 2016-01-22 NOTE — Telephone Encounter (Signed)
New Message  Pt c/o medication issue:  1. Name of Medication: Metoprolol.... Atorvastatin.... Losartan..... Carbidopa.... Brilinta  2. How are you currently taking this medication (dosage and times per day)? 50mg .....40mg ......25mg ....100mg ...... 90mg    3. Are you having a reaction (difficulty breathing--STAT)? Per Pt wife pt sick to his stomsch  4. What is your medication issue? Pt wife call requesting to speak with RN. Pt wife states pt has been sick to his stomach and does not know if its the medication. Pt would like to speka with RN to discuss if it could be any of the medication. Please call back to discuss

## 2016-01-27 ENCOUNTER — Encounter: Payer: Self-pay | Admitting: Cardiology

## 2016-01-28 ENCOUNTER — Encounter (INDEPENDENT_AMBULATORY_CARE_PROVIDER_SITE_OTHER): Payer: Self-pay

## 2016-01-28 ENCOUNTER — Ambulatory Visit (INDEPENDENT_AMBULATORY_CARE_PROVIDER_SITE_OTHER): Payer: PPO | Admitting: Cardiology

## 2016-01-28 ENCOUNTER — Encounter: Payer: Self-pay | Admitting: Cardiology

## 2016-01-28 VITALS — BP 130/72 | HR 84 | Ht 69.0 in | Wt 150.1 lb

## 2016-01-28 DIAGNOSIS — I472 Ventricular tachycardia: Secondary | ICD-10-CM

## 2016-01-28 DIAGNOSIS — I214 Non-ST elevation (NSTEMI) myocardial infarction: Secondary | ICD-10-CM | POA: Diagnosis not present

## 2016-01-28 DIAGNOSIS — I4729 Other ventricular tachycardia: Secondary | ICD-10-CM

## 2016-01-28 NOTE — Progress Notes (Signed)
01/28/2016 [name]   25-Mar-1941  XN:[id number]  Primary Physician Sheral Flow, NP Primary Cardiologist: Dr. Meda Coffee   Reason for Visit/CC: [name] F/u for CAD/ s/p NSTEMI  HPI:  75 y/o male with h/o HTN, HLD , COPD and newly diagnosed CAD, who presents to clinic today for post [name] f/u. He has had 2 admissions for CP within the last 6 weeks. He was first admitted in September with chest pain.  Troponin was elevated to 0.27.  He underwent stress testing that showed no ischemia but prior infarct. He was discharged home with no follow-up. His echocardiogram showed normal LVEF grade 1 diastolic dysfunction. No significant valvular abnormality.  He presented [facility] to [name] on October 5 with recurrent CP with associated  nausea and shortness of breath, that started the day before and was persistent. Troponin was 0.09 in the ED with normal creatinine and normal hemoglobin. His EKG showed sinus rhythm and new negative ST depressions in the inferolateral leads. He was admitted for NSTEMI and started on a nitro/heparin drip. Also placed on a BB and statin. He underwent LHC with Dr. Haroldine Laws showing 99% stenosed OM-1 treated with DES x1 by Dr. Irish Lack. EF was noted at 50%-55%. He was continued on ASA and Brilinta was added post cath. He was also noted to have runs of SVT/NSVT on telemetry and his metoprolol was increased. Per Discharge summary, it was recommended by Dr. Debara Pickett and Dr. Curt Bears that he be further evaluated with an outpatient event monitor.  He is here today with his wife and grandson. From a cardiac standpoint, he has done well. No recurrent CP. Ambulating w/o difficulty. No dyspnea. EKG shows NSR with PVCs. He denies palpitations.  BP is well controlled at 130/74. He has been fully compliant with all of his meds. He has noticed frequent bruising but no other abnormal bleeding. He has not required use of SL NTG. He was recently diagnosed with Parkinson  Disease. He has a f/u with Dr. Carles Collet tomorrow.    Current Meds  Medication Sig  . acetaminophen (TYLENOL) 500 MG tablet Take 500 mg by mouth every 6 (six) hours as needed for headache (pain).  Marland Kitchen albuterol (PROVENTIL HFA;VENTOLIN HFA) 108 (90 Base) MCG/ACT inhaler Inhale 2 puffs into the lungs every 6 (six) hours as needed for wheezing or shortness of breath. (Patient taking differently: Inhale 2 puffs into the lungs every 4 (four) hours as needed for wheezing or shortness of breath. )  . aspirin EC 81 MG EC tablet Take 1 tablet (81 mg total) by mouth daily.  Marland Kitchen atorvastatin (LIPITOR) 40 MG tablet Take 1 tablet (40 mg total) by mouth daily at 6 PM.  . carbidopa-levodopa (SINEMET IR) 25-100 MG tablet Take 1 tablet by mouth 3 (three) times daily.  . Fluticasone-Salmeterol (ADVAIR DISKUS) 250-50 MCG/DOSE AEPB Inhale 1 puff into the lungs every 12 (twelve) hours. (Patient taking differently: Inhale 1 puff into the lungs 2 (two) times daily as needed (for shortness of breath). )  . imiquimod (ALDARA) 5 % cream Apply 1 application topically 2 (two) times daily.  Marland Kitchen losartan (COZAAR) 25 MG tablet Take 1 tablet (25 mg total) by mouth daily.  . metoprolol succinate (TOPROL-XL) 50 MG 24 hr tablet Take 1 tablet (50 mg total) by mouth daily. Take with or immediately following a meal.  . nitroGLYCERIN (NITROSTAT) 0.4 MG SL tablet Place 1 tablet (0.4 mg total) under the tongue every 5 (five) minutes x 3 doses as needed for  chest pain.  . ticagrelor (BRILINTA) 90 MG TABS tablet Take 1 tablet (90 mg total) by mouth 2 (two) times daily.   Allergies  Allergen Reactions  . Levaquin [Levofloxacin In D5w] Nausea And Vomiting  . Penicillins Hives    Childhood allergic reaction per spouse - no other information available 01/18/16   Past Medical History:  Diagnosis Date  . Chest pain   . COPD (chronic obstructive pulmonary disease) ([place])   . Depression   . Diabetes ([place])   . Dysphagia   . Essential tremor   . Heart  disease   . Hyperlipidemia   . Hypertension   . Seizure disorder ([place])   . Tardive dyskinesia   . Vitamin D deficiency    Family History  Problem Relation Age of Onset  . Lung cancer Father   . CAD Brother     MI in his 71s    Past Surgical History:  Procedure Laterality Date  . CARDIAC CATHETERIZATION N/A 01/19/2016   Procedure: Left Heart Cath and Coronary Angiography;  Surgeon: Jolaine Artist, MD;  Location: [facility];  Service: Cardiovascular;  Laterality: N/A;  . CARDIAC CATHETERIZATION  01/19/2016   Procedure: Coronary Stent Intervention;  Surgeon: Jettie Booze, MD;  Location: [facility];  Service: Cardiovascular;;  . INGUINAL HERNIA REPAIR Left as child   Social History   Social History  . Marital status: Married    Spouse name: N/A  . Number of children: N/A  . Years of education: N/A   Occupational History  . Not on file.   Social History Main Topics  . Smoking status: Former Smoker    Quit date: 09/04/2004  . Smokeless tobacco: Never Used  . Alcohol use No  . Drug use: No  . Sexual activity: Not on file   Other Topics Concern  . Not on file   Social History Narrative   Married.   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   Enjoys spending time with family.      Review of Systems: General: negative for chills, fever, night sweats or weight changes.  Cardiovascular: negative for chest pain, dyspnea on exertion, edema, orthopnea, palpitations, paroxysmal nocturnal dyspnea or shortness of breath Dermatological: negative for rash Respiratory: negative for cough or wheezing Urologic: negative for hematuria Abdominal: negative for nausea, vomiting, diarrhea, bright red blood per rectum, melena, or hematemesis Neurologic: negative for visual changes, syncope, or dizziness All other systems reviewed and are otherwise negative except as noted above.   Physical Exam:  Blood pressure 130/72, pulse 84, height 5\' 9"  (1.753 m), weight  150 lb 1.9 oz (68.1 kg).  General appearance: alert, cooperative and no distress, thin, resting tremor Neck: no carotid bruit and no JVD Lungs: clear to auscultation bilaterally Heart: regular rate and rhythm, S1, S2 normal, no murmur, click, rub or gallop Extremities: no LEE Pulses: 2+ and symmetric Skin: warm and dry Neurologic: Grossly normal  EKG NSR with PVC  ASSESSMENT AND PLAN:   1. CAD: s/p recent NSTEMI secondary to 99% stenosed OM-1 treated with DES x1 by Dr. Irish Lack. EF was noted at 50%-55%. Stable w/o recurrent CP. Continue DAPT with ASA + Brilinta, statin, BB and ARB.  2. NSVT: frequent ectopy was noted during hospitalization. EKG today shows a PVC. Dr. Debara Pickett and Dr. Curt Bears recommended outpatient monitor to further assess. We will arrange today. Continue metoprolol.   3. HTN: well controlled  4. HLD: on statin therapy with Lipitor.  5. Parkinson  Disease: newly diagnosed. Followed by Dr. Carles Collet.    PLAN  F/u with Dr. Meda Coffee in 6 weeks.   Jay Jester PA-C 01/28/2016 8:27 AM

## 2016-01-28 NOTE — Patient Instructions (Addendum)
Your physician recommends that you continue on your current medications as directed. Please refer to the Current Medication list given to you today.  Your physician has recommended that you wear an event monitor. Event monitors are medical devices that record the heart's electrical activity. Doctors most often [place] these monitors to diagnose arrhythmias. Arrhythmias are problems with the speed or rhythm of the heartbeat. The monitor is a small, portable device. You can wear one while you do your normal daily activities. This is usually used to diagnose what is causing palpitations/syncope (passing out). [facility] recommends that you schedule a follow-up appointment in:6 [place]

## 2016-01-28 NOTE — Progress Notes (Signed)
[name] was seen today in the movement disorders clinic for neurologic consultation at the request of  Dr. Melrose Nakayama for a second opinion.  His primary care physician is Dr. Sheral Flow, NP.  The consultation is for the evaluation of tremor.  Notes from Dr. Melrose Nakayama were reviewed.  The patient's wife of 5 years also supplements the history.  Dr. Melrose Nakayama indicates that he felt that the patient's tremor is likely from chronic neuroleptic use in the past.  Notes from Dr. Melrose Nakayama indicates that the patient was on Seroquel, which appears that this was discontinued in early part of 2016.  His wife indicates that he was also on abilify and that was d/c in Jan 2016.  His wife doesn't remember the seroquel but that was what records indicate.  Wife states that it was abilify and he was on it x 1.5 years.  He was also on Depakote when Dr. Melrose Nakayama first began to see the patient, but is off of that now.  He is on Cogentin, 1 mg 3 times per day.  Pt states that it was started by Dr. Melrose Nakayama on 06/18/14 and pts wife states that no benefit.  Things may have gotten worse with it.  Wife states that tremor has been going on for 1.5 years.  It started with tremor in both hands.  Tremor has significantly picked up in both hands.  His wife states that he is now grunting and it happens all day long.  That has been going on for 3 months as has the head "bobbing."   They are unaware if neuroimaging has been performed.  He did have an EEG in January, 2015 that was reported to be normal.  His wife does mention a history of seizure.  She states that it started over 20 years ago and she really does not know the details of it.  She does note that he was in the hospital for a long time and when he came out he had to relearn to do everything.  She states that he had been seizure-free for well over a year until a few weeks ago when he was sitting in the chair and his eyes appeared to roll back and he slumped over for a  maximum of 3 seconds and then was completely normal again.  12/11/14 update:  The patient is following up today, accompanied by his wife who supplements the history.  Last visit, I took the patient off of Cogentin because of memory issues, gait changes and dry mouth.  We added levodopa.  This was done on 09/05/2014.  They called me on July 7 to state that the patient was more dizzy so we changed this from one tablet 3 times per day to half tablet 6 times per day.  His wife states that he ended up going to 1 tablet tid and they realized that the initial dizziness was from a bought of bronchitis.  His wife states that he has actually done much better with the medication.  Much less tremor.  No falls.  Much less agitation.  Asks if we can try and increase it again. He did have a routine EEG and an ambulatory EEG since our last visit as his wife had believed he had a reemergence of seizure.  He had a modified barium swallow done on 09/19/2014; this demonstrated a moderate oral phase and mild cervical phase dysphagia and a dysphagia 3 (mechanical soft) diet with thin liquids was recommended.  They have  been doing that.  He was supposed to have myobloc done today for sialorrhea but decided not to have that done today.    03/13/15 update:  The patient is following up today, accompanied by his wife who supplements the history.  Last visit, increase his carbidopa/levodopa 25/100, so that he was taking 1-1/2 tablets 3 times per day.  He admits that he isn't taking his medication at all, including his psychiatry meds (cymbalta).    I encouraged him to use walker at all times.  He hasn't done that.  In the past, he has refused formal therapy.  I have reviewed records since his last visit with me.  He went to the emergency room several times.  He went on September 12.  After mowing the lawn, he felt like he had swallowed something and had a foreign body sensation in the throat.  The workup was negative and he was released.  He  also went on September 25 with low back pain and November 7 with chest pain.  The workup was negative and he was not admitted for these.  He has had more trouble swallowing.  He admits that he eats whatever he wants and doesn't want to follow the speech therapy diet.  He does state that he wants to be DNR.  He had a fall since last visit.  He was outside looking for a kitten and he fell looking for it.    12/23/15 update:  The patient follows up today, accompanied by his wife who supplements the history.  I have not seen the patient in about 9 months.  He no showed his visit with me in March.  When I saw him last, he had discontinued his levodopa, and I had encouraged him to restart that.  He did not do that.  He had refused all physical therapy.  Wife states that he actually left my office last visit and d/c all his medications.  Records were reviewed since last visit.  He went to the emergency room on 05/03/2015 and was diagnosed with a renal stone entering the bladder.  He was admitted to the hospital on September 3 for chest pain.  Neurology saw him during that hospital stay.  Apparently, after he had a Myoview scan, the patient was transferring from a lying to a seated position and had a blank stare for 5-10 seconds.  Per some notes, it appears that the patient had stiffness and shaking, although neurology records did not note this.  Neurology felt likely orthostatic in nature.  Pt doesn't remember this.    The patient has had previous episodes that were similar and have previously been worked up.  He had a 24-hour ambulatory EEG in July, 2016 that was normal.  No falls.  He is having some trouble swallowing and some choking on foods.  Wife did state that when he was recently in the hospital, he was put on carbidopa/levodopa and he had no tremor.  I reviewed records and it appears that he was given carbidopa/levodopa 25/100 cr bid.  Wife states that they were given a RX but drug store told her that unable to  get that version of the medication.  Pt admits to some diplopia.  Pt not driving.  01/29/16 update:  The patient follows up today.  He is accompanied by his wife who supplements the history.  Last visit, I had him restart his carbidopa/levodopa 25/100 IR and told him to take it 3 times a day.  There was  a Producer, television/film/video of the CR version (the version he was previously on), so we switched to the immediate release version.  Compliance with medication and follow-up visits has been an impedance to good medical care in his case.  Wife reports that he is taking the carbidopa/levodopa 25/100 tid faithfully.  He was started on Keppra last visit for possible seizure.  He states that he didn't take it.  Wife states that "I didn't know he was supposed to take it."    No passing out spells.  He is on B12 injections for B12 deficiency.  Denies falls since last visit.  Denies hallucinations.  Denies syncope.  The records that were made available to me were reviewed since last visit.  He was hospitalized from October 7 to 01/21/2016.  He was admitted with NSTEMI and had a diagnostic cath with drug eluting stent placed.    ALLERGIES:   Allergies  Allergen Reactions  . Levaquin [Levofloxacin In D5w] Nausea And Vomiting  . Penicillins Hives    Childhood allergic reaction per spouse - no other information available 01/18/16    CURRENT MEDICATIONS:  Outpatient Encounter Prescriptions as of 01/29/2016  Medication Sig  . acetaminophen (TYLENOL) 500 MG tablet Take 500 mg by mouth every 6 (six) hours as needed for headache (pain).  Marland Kitchen albuterol (PROVENTIL HFA;VENTOLIN HFA) 108 (90 Base) MCG/ACT inhaler Inhale 2 puffs into the lungs every 6 (six) hours as needed for wheezing or shortness of breath. (Patient taking differently: Inhale 2 puffs into the lungs every 4 (four) hours as needed for wheezing or shortness of breath. )  . aspirin EC 81 MG EC tablet Take 1 tablet (81 mg total) by mouth daily.  Marland Kitchen atorvastatin  (LIPITOR) 40 MG tablet Take 1 tablet (40 mg total) by mouth daily at 6 PM.  . carbidopa-levodopa (SINEMET IR) 25-100 MG tablet Take 1 tablet by mouth 3 (three) times daily.  . Fluticasone-Salmeterol (ADVAIR DISKUS) 250-50 MCG/DOSE AEPB Inhale 1 puff into the lungs every 12 (twelve) hours. (Patient taking differently: Inhale 1 puff into the lungs 2 (two) times daily as needed (for shortness of breath). )  . imiquimod (ALDARA) 5 % cream Apply 1 application topically 2 (two) times daily.  Marland Kitchen levETIRAcetam (KEPPRA) 500 MG tablet Take 1 tablet (500 mg total) by mouth 2 (two) times daily. (Patient not taking: Reported on 01/28/2016)  . losartan (COZAAR) 25 MG tablet Take 1 tablet (25 mg total) by mouth daily.  . metoprolol succinate (TOPROL-XL) 50 MG 24 hr tablet Take 1 tablet (50 mg total) by mouth daily. Take with or immediately following a meal.  . nitroGLYCERIN (NITROSTAT) 0.4 MG SL tablet Place 1 tablet (0.4 mg total) under the tongue every 5 (five) minutes x 3 doses as needed for chest pain.  . ticagrelor (BRILINTA) 90 MG TABS tablet Take 1 tablet (90 mg total) by mouth 2 (two) times daily.   No facility-administered encounter medications on file as of 01/29/2016.     PAST MEDICAL HISTORY:   Past Medical History:  Diagnosis Date  . Chest pain   . COPD (chronic obstructive pulmonary disease) ([place])   . Depression   . Diabetes ([place])   . Dysphagia   . Essential tremor   . Heart disease   . Hyperlipidemia   . Hypertension   . Seizure disorder ([place])   . Tardive dyskinesia   . Vitamin D deficiency     PAST SURGICAL HISTORY:   Past Surgical History:  Procedure Laterality Date  .  CARDIAC CATHETERIZATION N/A 01/19/2016   Procedure: Left Heart Cath and Coronary Angiography;  Surgeon: Jolaine Artist, MD;  Location: [facility];  Service: Cardiovascular;  Laterality: N/A;  . CARDIAC CATHETERIZATION  01/19/2016   Procedure: Coronary Stent Intervention;  Surgeon: Jettie Booze, MD;   Location: [facility];  Service: Cardiovascular;;  . INGUINAL HERNIA REPAIR Left as child    SOCIAL HISTORY:   Social History   Social History  . Marital status: Married    Spouse name: N/A  . Number of children: N/A  . Years of education: N/A   Occupational History  . Not on file.   Social History Main Topics  . Smoking status: Former Smoker    Quit date: 09/04/2004  . Smokeless tobacco: Never Used  . Alcohol use No  . Drug use: No  . Sexual activity: Not on file   Other Topics Concern  . Not on file   Social History Narrative   Married.   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   Enjoys spending time with family.     FAMILY HISTORY:   Family Status  Relation Status  . Father Deceased   cancer, emphysema  . Mother Deceased   depression  . Brother Alive  . Sister Alive  . Brother Deceased    ROS:  A complete 10 system review of systems was obtained and was unremarkable apart from what is mentioned above.  PHYSICAL EXAMINATION:    VITALS:   There were no vitals filed for this visit.  GEN:  The patient appears stated age and is in NAD. HEENT:  Normocephalic, atraumatic.  The mucous membranes are very dry. The superficial temporal arteries are without ropiness or tenderness.  The tongue moves about in the mouth.  It does not come out of the mouth. CV:  RRR Lungs:  CTAB.  He has inspiratory gasps with vocal stridor. Neck/HEME:  There are no carotid bruits bilaterally.  The head/neck is held in a flexed manner with the chin close to the chest.  He has difficulty lifting the head to a neutral position.  Neurological examination:  Orientation: The patient is able to correctly identify the month, year and day of the week.   Cranial nerves: There is good facial symmetry.  There is significant facial hypomimia.   His speech is fluent and clear.  He grunts throughout the entire examination.  He has downgaze paresis.  Soft palate rises symmetrically and there  is no tongue deviation. Hearing is intact to conversational tone. Sensation: Sensation is intact to light touch throughout. Motor: Strength is at least antigravity 4.   Movement examination: Tone: There is mod increased tone in the LUE.  There is mild increased tone in the RUE Abnormal movements: There is a near constant L more than RUE resting tremor Coordination:  There is mild decremation with RAM's, seen with hand opening and closing just slightly and finger taps. Gait and Station: The patient has difficulty getting out of the chair.  He is short stepped but no freezing.  He is wide based  Lab Results  Component Value Date   VITAMINB12 118 (L) 12/14/2015     ASSESSMENT/PLAN:  1.  Multiple system atrophy  -Long discussion with the patient and his wife today.  Much greater than 50% of the 40 minute visit in counseling.  Talked to them again about the fact that I think he has multiple system atrophy.  He has neck flexion, dysphagia, parkinsonism  and possible orthostasis.    -on carbidopa/levodopa 25/100 tid.  Will do on/off test to see if higher dose would be of benefit  -Talked to him extensively about end-of-life issues.  Patient reported that he would like to be DO NOT RESUSCITATE.   -He does not want PT 2.  Dysphagia  -He had a modified barium swallow done on 09/19/2014; this demonstrated a moderate oral phase and mild cervical phase dysphagia and a dysphagia 3 (mechanical soft) diet with thin liquids was recommended.  He is not following this.  He and I discussed risks of aspiration pneumonia today.  He understands his increased risk of morbidity and mortality.  No real reason to repeat study if patient not going to follow recommendations, knows risk and is DNR 3.  Sialorrhea  -He did not do well with cogentin.  He decided to hold on myobloc.   4.  Tardive dyskinesia  -The patient does have some of this in the tongue.  I suspect that this is, in fact, from long-standing use of  anti-psychotic medication.  While tardive parkinsonism is rare, tardive dyskinesia is not rare.  He is not bothered by this. 5.  Pseudobulbar affect  -Better right now and decided to hold on further meds 6.  History of seizure  -His EEG and ambulatory EEG were negative.  He had an episode in the hospital in September, 2017 that I have reviewed and may have been convulsive syncope, but it may have been seizure as well.  I talked to them about potentially trying something such as Keppra.  He has been on medication in the past but discontinued it.  Compliance has been an issue amongst him regarding most of his medical care.  Pt willing to try the [place] now (didn't take when given last vist).  Risks, benefits, side effects and alternative therapies were discussed.  The opportunity to ask questions was given and they were answered to the best of my ability.  The patient expressed understanding and willingness to follow the outlined treatment protocols. 7.  B12 deficiency  -just started on injections

## 2016-01-29 ENCOUNTER — Encounter: Payer: Self-pay | Admitting: Neurology

## 2016-01-29 ENCOUNTER — Ambulatory Visit (INDEPENDENT_AMBULATORY_CARE_PROVIDER_SITE_OTHER): Payer: PPO | Admitting: Neurology

## 2016-01-29 VITALS — BP 142/72 | HR 95 | Ht 69.0 in | Wt 149.0 lb

## 2016-01-29 DIAGNOSIS — G232 Striatonigral degeneration: Secondary | ICD-10-CM

## 2016-01-29 DIAGNOSIS — K117 Disturbances of salivary secretion: Secondary | ICD-10-CM

## 2016-01-29 DIAGNOSIS — R569 Unspecified convulsions: Secondary | ICD-10-CM

## 2016-01-29 NOTE — Patient Instructions (Signed)
1. We have you scheduled for your on/off test on 03/23/16 at 2:30 pm. Please do not take your Parkinson's medications on this date. Take your last dose on 03/22/16 (the evening dose).

## 2016-02-02 ENCOUNTER — Ambulatory Visit (INDEPENDENT_AMBULATORY_CARE_PROVIDER_SITE_OTHER): Payer: PPO

## 2016-02-02 DIAGNOSIS — I472 Ventricular tachycardia: Secondary | ICD-10-CM | POA: Diagnosis not present

## 2016-02-04 ENCOUNTER — Encounter: Payer: Self-pay | Admitting: Primary Care

## 2016-02-04 ENCOUNTER — Ambulatory Visit (INDEPENDENT_AMBULATORY_CARE_PROVIDER_SITE_OTHER): Payer: PPO | Admitting: Primary Care

## 2016-02-04 ENCOUNTER — Other Ambulatory Visit: Payer: Self-pay

## 2016-02-04 ENCOUNTER — Ambulatory Visit (INDEPENDENT_AMBULATORY_CARE_PROVIDER_SITE_OTHER)
Admission: RE | Admit: 2016-02-04 | Discharge: 2016-02-04 | Disposition: A | Discharge status: Home / Self Care | Payer: PPO | Source: Ambulatory Visit | Attending: Primary Care | Admitting: Primary Care

## 2016-02-04 VITALS — BP 142/90 | HR 82 | Temp 96.5°F | Wt 151.2 lb

## 2016-02-04 DIAGNOSIS — R059 Cough, unspecified: Secondary | ICD-10-CM

## 2016-02-04 DIAGNOSIS — R1084 Generalized abdominal pain: Secondary | ICD-10-CM

## 2016-02-04 DIAGNOSIS — R110 Nausea: Secondary | ICD-10-CM

## 2016-02-04 DIAGNOSIS — R05 Cough: Secondary | ICD-10-CM

## 2016-02-04 DIAGNOSIS — R0602 Shortness of breath: Secondary | ICD-10-CM | POA: Diagnosis not present

## 2016-02-04 LAB — CBC WITH DIFFERENTIAL/PLATELET
BASOS ABS: 0 10*3/uL (ref 0.0–0.1)
BASOS PCT: 0.5 % (ref 0.0–3.0)
EOS PCT: 2.7 % (ref 0.0–5.0)
Eosinophils Absolute: 0.3 10*3/uL (ref 0.0–0.7)
HCT: 48.6 % (ref 39.0–52.0)
Hemoglobin: 16.3 g/dL (ref 13.0–17.0)
LYMPHS ABS: 2.2 10*3/uL (ref 0.7–4.0)
Lymphocytes Relative: 21.4 % (ref 12.0–46.0)
MCHC: 33.6 g/dL (ref 30.0–36.0)
MCV: 85.6 fl (ref 78.0–100.0)
MONOS PCT: 8 % (ref 3.0–12.0)
Monocytes Absolute: 0.8 10*3/uL (ref 0.1–1.0)
NEUTROS ABS: 7 10*3/uL (ref 1.4–7.7)
NEUTROS PCT: 67.4 % (ref 43.0–77.0)
PLATELETS: 287 10*3/uL (ref 150.0–400.0)
RBC: 5.67 Mil/uL (ref 4.22–5.81)
RDW: 14.5 % (ref 11.5–15.5)
WBC: 10.4 10*3/uL (ref 4.0–10.5)

## 2016-02-04 LAB — COMPREHENSIVE METABOLIC PANEL
ALT: 6 U/L (ref 0–53)
AST: 15 U/L (ref 0–37)
Albumin: 4.4 g/dL (ref 3.5–5.2)
Alkaline Phosphatase: 107 U/L (ref 39–117)
BILIRUBIN TOTAL: 1.1 mg/dL (ref 0.2–1.2)
BUN: 15 mg/dL (ref 6–23)
CO2: 29 meq/L (ref 19–32)
Calcium: 9.7 mg/dL (ref 8.4–10.5)
Chloride: 101 mEq/L (ref 96–112)
Creatinine, Ser: 0.73 mg/dL (ref 0.40–1.50)
GFR: 111.73 mL/min (ref >60.00)
GLUCOSE: 125 mg/dL — AB (ref 70–99)
Potassium: 3.5 mEq/L (ref 3.5–5.1)
SODIUM: 140 meq/L (ref 135–145)
Total Protein: 7.8 g/dL (ref 6.0–8.3)

## 2016-02-04 MED ORDER — ONDANSETRON 4 MG PO TBDP: 4.0000 mg | ORAL_TABLET | Freq: Three times a day (TID) | ORAL | 0 refills | Status: DC | PRN

## 2016-02-04 NOTE — Patient Instructions (Signed)
Complete lab work and chest xray prior to leaving today.   You may take Zofran (ondansetron) tablets every 8 hours as needed for nausea/vomiting. Melt 1 tablet in your mouth every 8 hours.  You MUST start using your Advair Inhaler (purple, round inhaler) as prescribed. Inhale 1 puff into the lungs every 12 hours.  The albuterol inhaler (red and white inhaler) is only to be used as needed for increased shortness of breath or wheezing. Inhale 2 puffs into the lungs every 6 hours as needed.  Please notify me if you develop fevers, increased abdominal pain, vomiting, start coughing up green mucous.  I'll be in touch soon! It was a pleasure to see you today!

## 2016-02-04 NOTE — Patient Outreach (Signed)
[facility]) Care Management  02/04/2016  [name] [birth year] [id number]     Telephone Screen  Referral Date: 02/03/16 Referral Source: [facility] Referral Reason: "ongoing education, disease and symptom management"    Outreach attempt # 1 to patient. Spoke with patient who requested call be completed with spouse who handles his medical affairs. Screening completed with spouse.   Social: Patient resides in his home along with spouse. Per spouse he requires assistance with ADLs/IADLs. She drives patient to MD appts. She voices that patient has not had any recent falls. DME in the home include: walker, rollator, wheelchair and cane.   Conditions: Patient has PMH of COPD, depression, heart disease, HTN, HLD, seizures and tremors. Spouse reports that patient currently wearing a 30 day heart monitor and started it on 02/02/16.  Per records his last seizure was a few weeks ago. Patient has been having worsening tremors and "head bobbing." Spouse reports he has seen a neurologist for further eval and work up but so far no definitive diagnosis. She is monitoring patient's BP in the home. She reports BP running in the 130s/70s. Spouse reports that patient has not been feeling well over the past few days. She thinks he has a cold and possible stomach bug. She took patient to see PCP today and was given some meds to help manage symptoms. Patient was hospitalized from 01/17/16 to 01/21/16 for chest pain and NSTEMI. Spouse reports he had stents placed during hospitalization and has been doing better.    Medications: Spouse manages patient's meds. She reports that they will be unable to afford Brilinta. He was sent home on new at discharge. Spouse reports he was given a coupon for a month supply but she does not know how they will be able to afford it once refill on med needed.   Appointments: Patient had PCP appt earlier today. He saw neurologist(Dr. Wells Guiles Tat)  01/29/16 and saw cardiologist(Dr. Meda Coffee) on 02/02/16.   Advance Directives: None. Declined further info at this time.   Consent: [facility] services reviewed and discussed. Verbal consent received from spouse.   Plan: RN CM will notify [facility] administrative assistant of case status. RN CM will send [facility] community referral for TOC case. RN CM will send [facility] pharmacy referral for possible med assistance. RN CM provided spouse with [facility] 24/7 Nurse Line contact info.  Jay Montgomery, RN,BSN,CCM [place] Management Telephonic Care Management Coordinator Direct Phone: [phone] Toll Free: [phone] Fax: [phone]

## 2016-02-04 NOTE — Progress Notes (Signed)
Pre visit review using our clinic review tool, if applicable. No additional management support is needed unless otherwise documented below in the visit note. 

## 2016-02-04 NOTE — Progress Notes (Signed)
Subjective:    Patient ID: [name], male    DOB: May 09, 1940, 75 y.o.   MRN: [id number]  HPI  [name] is a 75 year old male who presents today with a chief complaint of cough. [name] also reports nausea, fatigue, shortness of breath, abdominal pain. Denies vomiting, productive cough, fevers, diarrhea. His symptoms began 2 days ago.[name]'s not taken anything OTC for his symptoms. [name]'s not using his Advair or albuterol inhaler as prescribed.  [name] was recently treated and discharged from the hospital for NSEMI, NSVT, Status post coronary artery stent placement. [name] recently followed with Cardiology post hospitalization and was doing well at that point. [name]'s also recently established with neurology for Parkinson's Disease and was placed on carbidopa/levodopa 25/100 IR.   Review of Systems  Constitutional: Positive for fatigue. Negative for chills and fever.  HENT: Negative for congestion and sore throat.   Respiratory: Positive for cough and shortness of breath. Negative for wheezing.   Cardiovascular: Negative for chest pain.  Gastrointestinal: Positive for abdominal pain and nausea.       Past Medical History:  Diagnosis Date  . Chest pain   . COPD (chronic obstructive pulmonary disease) ([place])   . Depression   . Diabetes ([place])   . Dysphagia   . Essential tremor   . Heart disease   . Hyperlipidemia   . Hypertension   . Seizure disorder ([place])   . Tardive dyskinesia   . Vitamin D deficiency      Social History   Social History  . Marital status: Married    Spouse name: N/A  . Number of children: N/A  . Years of education: N/A   Occupational History  . Not on file.   Social History Main Topics  . Smoking status: Former Smoker    Quit date: 09/04/2004  . Smokeless tobacco: Never Used  . Alcohol use No  . Drug use: No  . Sexual activity: Not on file   Other Topics Concern  . Not on file   Social History Narrative   Married.   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   Enjoys spending time with family.     Past Surgical History:  Procedure Laterality Date  . CARDIAC CATHETERIZATION N/A 01/19/2016   Procedure: Left Heart Cath and Coronary Angiography;  Surgeon: Jolaine Artist, MD;  Location: [place] [facility];  Service: Cardiovascular;  Laterality: N/A;  . CARDIAC CATHETERIZATION  01/19/2016   Procedure: Coronary Stent Intervention;  Surgeon: Jettie Booze, MD;  Location: [place] [facility];  Service: Cardiovascular;;  . INGUINAL HERNIA REPAIR Left as child    Family History  Problem Relation Age of Onset  . Lung cancer Father   . CAD Brother     MI in his 3s     Allergies  Allergen Reactions  . Levaquin [Levofloxacin In D5w] Nausea And Vomiting  . Penicillins Hives    Childhood allergic reaction per spouse - no other information available 01/18/16    Current Outpatient Prescriptions on File Prior to Visit  Medication Sig Dispense Refill  . acetaminophen (TYLENOL) 500 MG tablet Take 500 mg by mouth every 6 (six) hours as needed for headache (pain).    Marland Kitchen albuterol (PROVENTIL HFA;VENTOLIN HFA) 108 (90 Base) MCG/ACT inhaler Inhale 2 puffs into the lungs every 6 (six) hours as needed for wheezing or shortness of breath. (Patient taking differently: Inhale 2 puffs into the lungs every 4 (four) hours as needed for wheezing  or shortness of breath. ) 1 Inhaler 2  . aspirin EC 81 MG EC tablet Take 1 tablet (81 mg total) by mouth daily. 30 tablet 0  . atorvastatin (LIPITOR) 40 MG tablet Take 1 tablet (40 mg total) by mouth daily at 6 PM. 30 tablet 6  . carbidopa-levodopa (SINEMET IR) 25-100 MG tablet Take 1 tablet by mouth 3 (three) times daily. 90 tablet 2  . Fluticasone-Salmeterol (ADVAIR DISKUS) 250-50 MCG/DOSE AEPB Inhale 1 puff into the lungs every 12 (twelve) hours. (Patient taking differently: Inhale 1 puff into the lungs 2 (two) times daily as needed (for shortness of breath). ) 60 each 5  . imiquimod (ALDARA) 5 % cream Apply  1 application topically 2 (two) times daily.    Marland Kitchen losartan (COZAAR) 25 MG tablet Take 1 tablet (25 mg total) by mouth daily. 30 tablet 6  . metoprolol succinate (TOPROL-XL) 50 MG 24 hr tablet Take 1 tablet (50 mg total) by mouth daily. Take with or immediately following a meal. 30 tablet 6  . nitroGLYCERIN (NITROSTAT) 0.4 MG SL tablet Place 1 tablet (0.4 mg total) under the tongue every 5 (five) minutes x 3 doses as needed for chest pain. 25 tablet 3  . ticagrelor (BRILINTA) 90 MG TABS tablet Take 1 tablet (90 mg total) by mouth 2 (two) times daily. 60 tablet 0   No current facility-administered medications on file prior to visit.     BP (!) 142/90 (BP Location: Right Arm, Patient Position: Sitting, Cuff Size: Normal)   Pulse 82   Temp (!) 96.5 F (35.8 C) (Tympanic)   Wt 151 lb 4 oz (68.6 kg)   SpO2 99%   BMI 22.34 kg/m    Objective:   Physical Exam  Constitutional: [name] appears well-nourished. [name] does not have a sickly appearance.  HENT:  Right Ear: Tympanic membrane and ear canal normal.  Left Ear: Tympanic membrane and ear canal normal.  Nose: No mucosal edema. Right sinus exhibits no maxillary sinus tenderness and no frontal sinus tenderness. Left sinus exhibits no maxillary sinus tenderness and no frontal sinus tenderness.  Mouth/Throat: Oropharynx is clear and moist.  Eyes: Conjunctivae are normal.  Neck: Neck supple.  Cardiovascular: Normal rate and regular rhythm.   Pulmonary/Chest: Effort normal. [name] has decreased breath sounds in the right lower field. [name] has no wheezes.  Abdominal: Soft. Normal appearance and bowel sounds are normal. There is generalized tenderness.  Skin: Skin is warm and dry.          Assessment & Plan:  Cough:  Cough, fatigue x 2 days. Not using inhalers as prescribed. Monitored patient today while [name] attempted to use Advair and does not have enough strength to use. Will switch to another inhaler or consider nebulized treatments. Suspect  untreated COPD to be cause of cough, also could be viral URI. Given recent hospitalization and questionable lung sounds, will obtain chest xray to rule out CAP.  Does not appear sickly, vitals stable.   Abdominal Pain:  Generalized pain with nausea.  No vomiting, diarrhea, constipation, fevers. Exam today with generalized pain. Incision site appears well. Will check CMP and CBC today given examination. Could be viral, esp given cough symptoms,or initiation of numerous medications during hospitalization.  Rx for Zofran ODT sent to pharmacy.  Sheral Flow, NP

## 2016-02-05 ENCOUNTER — Other Ambulatory Visit: Payer: Self-pay | Admitting: *Deleted

## 2016-02-05 NOTE — Patient Outreach (Addendum)
Transition of care call successful as this RN CM covering for coworker [employer] RN CM.  View in pt's chart shows pt  received a phone call  from [facility] telephonic RN CM yesterday following up on [facility] referral- call revealed recent hospitalization 10/7-10/11 for chest pain, NSTEMI, stent placed.  Spoke with pt, HIPAA verified,permission given to speak to spouse Katharine Look related to hospital discharge/ pt in background relaying answers to spouse as needed. RN CM discussed purpose of call- transition of care plus covering for coworker Richarda Osmond RN CM.  Spouse reports pt followed up with Primary Care MD, Heart MD, Neurologist post discharge.  Spouse reports pt saw Alma Friendly NP yesterday - sick, cough, was given Zofran but needs something for his cough, has Advair, pt too weak to use.  Spouse reports chest xray, labs were done yesterday, results good on both.  Spouse reports Heart MD was suppose to be called to see what inhaler pt can take,pt can only take certain cough medications, have not heard back yet.  Spouse reports concerned with pt's hx of COPD.  Spouse reports pt is sob going to the car, okay around the house, pt relayed to spouse currently no chest pain.  Spouse reports pt did use his rescue inhaler yesterday, helped, did have a nebulizer machine in the past but it is broke.   Spouse reports pt declined cardiac rehab.   Patient was recently discharged from hospital and all medications have been reviewed.  Plan:  As discussed, spouse to call Alma Friendly NP to see what pt can take for his cough.             As discussed with spouse, this RN CM will provide update to [place] upon her return,                Richarda Osmond RN CM  to follow up with pt next week as part of ongoing transition of care.            RN CM provided spouse with contact name and number to call if needed while covering for                [employer] RN CM.    Zara Chess.   [facility] Care Management   [phone]

## 2016-02-12 ENCOUNTER — Other Ambulatory Visit: Payer: Self-pay | Admitting: *Deleted

## 2016-02-12 ENCOUNTER — Encounter: Payer: Self-pay | Admitting: *Deleted

## 2016-02-12 NOTE — Patient Outreach (Signed)
[facility]) Care Management [place] Telephone Outreach, Transition of Care day 8 02/12/2016  Add Jezewski 1941-04-11 [id number]  Unsuccessful telephone outreach attempt to [name], 75 y/o male referred to [facility] for transition of care after 2 recent hospitalizations September 3-5, 2017 for chest pain, confusion, cough and near syncope. Patient was re-admitted October 7-11, 2017 for NTEMI and had stent placement on January 19, 2016.  Patient has a history including but not limited to, HTN, hyperlipedemia, COPD, seizure disorder, cardiac arrhythmias, and Parkinson's disease.  Patient was discharged home from most recent hospitalization with [facility]) services.  Today, I was unable to leave a voicemail message for patient, as automated outgoing voice messaging said that "the voicemail has not been set up yet."  Plan:  Will re-attempt telephone outreach for ongoing transition of care tomorrow.   Oneta Rack, RN, BSN, [employer] [facility] Care Management  [phone]

## 2016-02-13 ENCOUNTER — Encounter: Payer: Self-pay | Admitting: *Deleted

## 2016-02-13 ENCOUNTER — Other Ambulatory Visit: Payer: Self-pay | Admitting: *Deleted

## 2016-02-13 NOTE — Patient Outreach (Signed)
[facility]) [name] Management [place] Telephone [name], [name] day 9 02/13/2016  [name] 08/28/1940 [id number]  Successful telephone [name] to Vanessa Barbara, wife of Jeremai Russin, 75 y/o male referred to [place] for [name] after 2 recent hospitalizations September 3-5, 2017 for chest pain, confusion, cough and near syncope. Patient was re-admitted October 7-11, 2017 for NTEMI and had stent placement on January 19, 2016.  Patient has a history including but not limited to, HTN, hyperlipedemia, COPD, seizure disorder, cardiac arrhythmias, and Parkinson's disease.  Patient was discharged home from most recent hospitalization with [facility]) services recommended, which patient declined.  HIPAA/ identity verified through patient's wife/ caregiver, Ermias Sandiego, on [facility] written consent.  Today, pt.'s wife reports that patient is "doing better."  -- cough reported last week, "better now." -- patient has no complaints of chest pain/ nor other pain. -- pt. has all medications and is taking as prescribed. -- planning to attend upcoming PCP appointment on February 23, 2016; wife reports she will transport patient to appointment. -- decreased appetite; wife reports she is trying to have patient eat small frequent meals. -- patient continuing to wear cardiac event monitor.  Patient's wife/ caregiver denies further concerns, issues, problems or questions.  I confirmed that she and patient had phone numbers for main [facility] CM office, 24-hour nurse advice line, and provided her with my direct phone number.  We discussed difference between [facility] CM and HH services, and were able to make an appointment today for initial home visit next week.  Plan:  Patient will take his medications as they are prescribed and will attend all provider appointments.   Patient and/ or wife will contact his providers for any concerns, needs, issues, or  problems that arise.   [place] [name] for [name] to continue with scheduled initial home visit next week.   I will make patient's PCP aware of [facility] CM involvement in patient's [name].   Oneta Rack, RN, BSN, [employer] [facility] [name] Management  [phone]

## 2016-02-17 ENCOUNTER — Encounter (HOSPITAL_COMMUNITY): Payer: Self-pay | Admitting: Emergency Medicine

## 2016-02-17 ENCOUNTER — Emergency Department (HOSPITAL_COMMUNITY)
Admission: EM | Admit: 2016-02-17 | Discharge: 2016-02-17 | Disposition: A | Discharge status: Home / Self Care | Payer: PPO | Attending: Emergency Medicine | Admitting: Emergency Medicine

## 2016-02-17 DIAGNOSIS — Z955 Presence of coronary angioplasty implant and graft: Secondary | ICD-10-CM | POA: Insufficient documentation

## 2016-02-17 DIAGNOSIS — J449 Chronic obstructive pulmonary disease, unspecified: Secondary | ICD-10-CM | POA: Insufficient documentation

## 2016-02-17 DIAGNOSIS — Z87891 Personal history of nicotine dependence: Secondary | ICD-10-CM | POA: Diagnosis not present

## 2016-02-17 DIAGNOSIS — Z7982 Long term (current) use of aspirin: Secondary | ICD-10-CM | POA: Insufficient documentation

## 2016-02-17 DIAGNOSIS — I10 Essential (primary) hypertension: Secondary | ICD-10-CM | POA: Diagnosis not present

## 2016-02-17 DIAGNOSIS — I252 Old myocardial infarction: Secondary | ICD-10-CM | POA: Insufficient documentation

## 2016-02-17 DIAGNOSIS — E119 Type 2 diabetes mellitus without complications: Secondary | ICD-10-CM | POA: Diagnosis not present

## 2016-02-17 DIAGNOSIS — H9221 Otorrhagia, right ear: Secondary | ICD-10-CM | POA: Insufficient documentation

## 2016-02-17 NOTE — ED Triage Notes (Signed)
Pt stastes "ive got skin cancer on my ear, I was picking at it and now its bleeding, im on blood thinners and I cant get it to quit bleeding." Pt states he was scratching at a scab and he cant get the bleeding to stop. Pt in NAD> denies pain. Bandaid is applied to ear and bleeding is controlled. Blood clots noted on patients ear.

## 2016-02-17 NOTE — Discharge Instructions (Signed)
Please refrain from picking at scabs. You should trim back finger nails.

## 2016-02-17 NOTE — ED Provider Notes (Signed)
[place] DEPT Provider Note   CSN: [id number] Arrival date & time: 02/17/16  2227     History   Chief Complaint Chief Complaint  Patient presents with  . Coagulation Disorder    HPI [name] is a 75 y.o. male.  HPI 75 year old male presents with bleeding from the right earlobe after taken at his years with his fingernails. The bleeding started approximately an hour prior to arrival. Wife is unable to control the bleeding. Patient is on Brilinta for coronary stents. No other anticoagulation. No other physical complaints.   Past Medical History:  Diagnosis Date  . Chest pain   . COPD (chronic obstructive pulmonary disease) ([place])   . Depression   . Diabetes ([place])   . Dysphagia   . Essential tremor   . Heart disease   . Hyperlipidemia   . Hypertension   . Seizure disorder ([place])   . Tardive dyskinesia   . Vitamin D deficiency     Patient Active Problem List   Diagnosis Date Noted  . Status post coronary artery stent placement   . SVT (supraventricular tachycardia) ([place])   . NSVT (nonsustained ventricular tachycardia) ([place])   . NSTEMI (non-ST elevated myocardial infarction) ([place])   . Parkinson's variant of multiple system atrophy ([place]) 12/19/2015  . Encephalopathy chronic 12/14/2015  . Chest pain   . COPD (chronic obstructive pulmonary disease) ([place])   . Heart disease   . Seizure disorder ([place])   . Depression   . Tardive dyskinesia   . Dysphagia   . Skin lesion 08/14/2015  . Shortness of breath 08/14/2015  . Essential hypertension 08/14/2015  . Hyperlipidemia 08/14/2015  . Essential tremor 08/14/2015    Past Surgical History:  Procedure Laterality Date  . CARDIAC CATHETERIZATION N/A 01/19/2016   Procedure: Left Heart Cath and Coronary Angiography;  Surgeon: Jolaine Artist, MD;  Location: [place] [facility];  Service: Cardiovascular;  Laterality: N/A;  . CARDIAC CATHETERIZATION  01/19/2016   Procedure: Coronary Stent Intervention;   Surgeon: Jettie Booze, MD;  Location: [place] [facility];  Service: Cardiovascular;;  . INGUINAL HERNIA REPAIR Left as child       Home Medications    Prior to Admission medications   Medication Sig Start Date End Date Taking? Authorizing Provider  acetaminophen (TYLENOL) 500 MG tablet Take 500 mg by mouth every 6 (six) hours as needed for headache (pain).    Historical Provider, MD  albuterol (PROVENTIL HFA;VENTOLIN HFA) 108 (90 Base) MCG/ACT inhaler Inhale 2 puffs into the lungs every 6 (six) hours as needed for wheezing or shortness of breath. Patient taking differently: Inhale 2 puffs into the lungs every 4 (four) hours as needed for wheezing or shortness of breath.  12/16/15   Belkys A Regalado, MD  aspirin EC 81 MG EC tablet Take 1 tablet (81 mg total) by mouth daily. 12/16/15   Belkys A Regalado, MD  atorvastatin (LIPITOR) 40 MG tablet Take 1 tablet (40 mg total) by mouth daily at 6 PM. 01/21/16   Cheryln Manly, NP  carbidopa-levodopa (SINEMET IR) 25-100 MG tablet Take 1 tablet by mouth 3 (three) times daily. 12/23/15   Eustace Quail Tat, DO  Fluticasone-Salmeterol (ADVAIR DISKUS) 250-50 MCG/DOSE AEPB Inhale 1 puff into the lungs every 12 (twelve) hours. Patient taking differently: Inhale 1 puff into the lungs 2 (two) times daily as needed (for shortness of breath).  08/26/15   Pleas Koch, NP  imiquimod [place]) 5 % cream Apply 1 application  topically 2 (two) times daily. 01/12/16   Historical Provider, MD  levETIRAcetam (KEPPRA) 500 MG tablet Take 500 mg by mouth 2 (two) times daily.    Historical Provider, MD  losartan (COZAAR) 25 MG tablet Take 1 tablet (25 mg total) by mouth daily. 01/21/16   Cheryln Manly, NP  metoprolol succinate (TOPROL-XL) 50 MG 24 hr tablet Take 1 tablet (50 mg total) by mouth daily. Take with or immediately following a meal. 01/21/16   Cheryln Manly, NP  nitroGLYCERIN (NITROSTAT) 0.4 MG SL tablet Place 1 tablet (0.4 mg total) under the tongue  every 5 (five) minutes x 3 doses as needed for chest pain. Patient not taking: Reported on 02/05/2016 01/21/16   Cheryln Manly, NP  ondansetron (ZOFRAN ODT) 4 MG disintegrating tablet Take 1 tablet (4 mg total) by mouth every 8 (eight) hours as needed for nausea or vomiting. Patient not taking: Reported on 02/05/2016 02/04/16   Pleas Koch, NP  ticagrelor (BRILINTA) 90 MG TABS tablet Take 1 tablet (90 mg total) by mouth 2 (two) times daily. 01/21/16   Cheryln Manly, NP    Family History Family History  Problem Relation Age of Onset  . Lung cancer Father   . CAD Brother     MI in his 58s     Social History Social History  Substance Use Topics  . Smoking status: Former Smoker    Quit date: 09/04/2004  . Smokeless tobacco: Never Used  . Alcohol use No     Allergies   Levaquin [levofloxacin in d5w] and Penicillins   Review of Systems Review of Systems  Skin: Positive for wound.     Physical Exam Updated Vital Signs BP 151/71 (BP Location: Left Arm)   Pulse 89   Temp (!) 96.9 F (36.1 C) (Axillary)   Resp 20   SpO2 100%   Physical Exam  Constitutional: He is oriented to person, place, and time. He appears well-developed and well-nourished. No distress.  HENT:  Head: Normocephalic and atraumatic.    Right Ear: External ear normal.  Left Ear: External ear normal.  Nose: Nose normal.  Mouth/Throat: Mucous membranes are normal. No trismus in the jaw.  Eyes: Conjunctivae and EOM are normal. No scleral icterus.  Neck: Normal range of motion and phonation normal.  Cardiovascular: Normal rate and regular rhythm.   Pulmonary/Chest: Effort normal. No stridor. No respiratory distress.  Abdominal: He exhibits no distension.  Musculoskeletal: Normal range of motion. He exhibits no edema.  Neurological: He is alert and oriented to person, place, and time.  Skin: He is not diaphoretic.  Psychiatric: He has a normal mood and affect. His behavior is normal.    Vitals reviewed.    ED Treatments / Results  Labs (all labs ordered are listed, but only abnormal results are displayed) Labs Reviewed - No data to display  EKG  EKG Interpretation None       Radiology No results found.  Procedures Wound repair Date/Time: 02/17/2016 11:03 PM Performed by: Fatima Blank Authorized by: Fatima Blank  Consent: Verbal consent obtained. Consent given by: patient Patient understanding: patient states understanding of the procedure being performed Patient identity confirmed: verbally with patient Local anesthesia used: no  Anesthesia: Local anesthesia used: no Comments: Silver nitrate utilized to control bleeding.    (including critical care time)  Medications Ordered in ED Medications - No data to display   Initial Impression / Assessment and Plan / ED Course  I  have reviewed the triage vital signs and the nursing notes.  Pertinent labs & imaging results that were available during my care of the patient were reviewed by me and considered in my medical decision making (see chart for details).  Clinical Course     Bleeding controlled with silver nitrate.  The patient is safe for discharge with strict return precautions.   Final Clinical Impressions(s) / ED Diagnoses   Final diagnoses:  Otorrhagia of right ear   Disposition: Discharge  Condition: Good  I have discussed the results, Dx and Tx plan with the patient who expressed understanding and agree(s) with the plan. Discharge instructions discussed at great length. The patient was given strict return precautions who verbalized understanding of the instructions. No further questions at time of discharge.    Current Discharge Medication List      Follow Up: Pleas Koch, NP [place] [id number] [phone]  Schedule an appointment as soon as possible for a visit  As needed      Fatima Blank, MD 02/17/16  2304

## 2016-02-18 ENCOUNTER — Encounter: Payer: Self-pay | Admitting: *Deleted

## 2016-02-18 ENCOUNTER — Other Ambulatory Visit: Payer: Self-pay | Admitting: *Deleted

## 2016-02-18 NOTE — Patient Outreach (Signed)
[facility]) Care Management  [place] Visit, Transition of Care day 14 02/18/2016  [name] 1940/04/19 [id number]  [name] is an 75 y.o. male referred to [place] for transition of care after 2 recent hospitalizationsSeptember 3-5, 2017 for chest pain, confusion, cough and near syncope. Patient was re-admitted October 7-11, 2017 for NSTEMI and had stent placement on January 19, 2016. Patienthas a history including but not limited to, HTN, hyperlipedemia, COPD, seizure disorder, cardiac arrhythmias, and Parkinson's disease. Patient was discharged home from most recent hospitalization with [place]) services recommended, which patient declined.  HIPAA/ identity verified in person with patient today.   Today, [name] reports that he "guesses" he is "doing okay" after his recent hospitalization:  Depression:  Patient is obviously significantly depressed today, and cries intermittently throughout in-home visit.  Patient verbally expresses that he "is ready to die," and "is tired of living."  Patient adamantly denies suicidal ideation however, stating that he would not harm himself intentionally; he verbalizes that he just "wishes" he would "go on and die."  Patient reports that he has had these feelings of depression "since September" when he first went to the hospital.  Prior to then, he states that he was able to get outside and mow the lawn or walk his dogs.  Now, patient reports that he "is useless, and can't do anything."  Patient reports insomnia, stating, "I can't sleep at all."  Patient estimates that he has not had any "real sleep" since September.  Patient also reports ongoing family dynamic issues with his ex-brother-in-law, who lives within walking distance of he and his wife, [name], as well as family dynamic issues with his daughter and granddaughter, all of which he says has contributed to his depression.  Patient  reports that he has not discussed his feelings of hopelessness/ depression with his PCP or with any of his medical providers.  I encouraged patient and his wife to report these feelings to Allie Bossier, NP, PCP during scheduled office visit on Monday February 23, 2016, and both agreed to do so.  I also informed both that I would also be reporting patient's depression/ feelings of hopelessness to Allie Bossier, NP PCP as well, and patient/ wife expressed understanding and agreement.  Treatment options for depression were discussed with patient and his wife today.  Patient stated that he "might" consider seeing psychiatrist, but stated "I don't think it would do any good."  Despite patient's obvious sadness during today's in-home visit, he was noted to be able to laugh several times at his dogs activities, at jokes made by himself, his wife, or in interaction with me.  Medications:  Medication reconciliation was performed with patient and wife today during in-home visit.  Wife prepares patient's medications from bottle and administers to patient.  -- Patient continues to be unable to use inhalers as prescribed, (Advair, Albuterol) stating that he "does not have enough wind" to use inhalers appropriately.  Patient reports that PCP is aware and is "going to change my inhalers to something else that is easier to use."  Patient is unable to demonstrate use of inhalers today during in-home visit.  Patient reports that when he gets short of breath, or feels like he "can't get any air," he sits down and stays still "until it passes."  Patient is not in any respiratory distress today.  I encouraged patient/ wife to re-discuss with PCP at upcoming appointment February 23, 2016  --  Not currently taking levetiracetam (Keppra); patient and wife report that patient's neurologist "never ordered it."  I noted from EMR review that it appears to be an active Rx, and together, we contacted patient's pharmacy to inquire.  Pharmacist  "Sherren Mocha" at [place] [phone]) reports that Rx is active and ready to be filled.  Pharmacist reports that medication will be filled and ready for pickup within hour of call being placed.  Wife states she will obtain medication and begin administering to patient as ordered.  Patient was recently discharged from hospital and all medications were thoroughly reviewed with patient and his wife today.  Advanced Directives:  Patient and wife report patient does not currently have Advanced Directives; education provided and questions answered.  Patient reports that he would like to be a "Do Not resuscitate" but currently is not.  Encouraged patient to discuss with PCP at scheduled office visit February 23, 2016, and patient/ wife agreed to do so.  Safety:  Patient has significant tremors from Parkinson's disease and walks with a cane.  No obvious safety issues identified during in-home visit and patient denies falls.  Community Resource needs/ support systems:  Patient and his wife deny community resource needs at present.  Wife provides transportation and assistance for all patient care needs.  Patient and his wife state that they have no close family members involved in their lives, due to ongoing family dynamic issues that have been "going on for years."  Various community resources for assistance were discussed with patient/ wife, and each was declined, as wife is presently able to meet patient care needs.   Self-health management of HTN/ cardiac arrythmias/ cardiac disease:   -- Patient's wife checks BP "every 2-3 days," but does not record.  Value of keeping written record discussed with patient/ wife, BP log provided to patient/ wife -- Low sodium/ heart healthy diet discussed with patient and wife, who state they have "started" watching diet choices more closely.  Education provided today on same. -- Importance of prompt notification of providers for new concerns stressed with patient and  wife. -- Patient currently wearing holter cardiac monitor; due to be completed on March 04, 2016.  Patient/ wife deny further issues, problems or concerns today.  [place] services discussed with both, and both are agreeable for [facility] CM assistance.  I made sure patient/ wife had my direct phone number, the main office number for [facility] CM, and the [facility] 24-hour nurse line.  We scheduled next [place] in-home visit for next month.  Subjective: "I'm tired of living.... I am just ready to go.... I can't do anything I used to... I am just useless."  Objective:    BP 118/64   Pulse 86   Resp 16   SpO2 96%    Review of Systems  Constitutional: Positive for malaise/fatigue. Negative for fever and weight loss.  HENT: Negative.   Eyes: Negative.   Respiratory: Positive for shortness of breath. Negative for cough.        Patient has shortness of breath at baseline; today is slightly short of breath with activity, otherwise patient does not appear to be in distress when at rest, sitting in chair  Cardiovascular: Negative for chest pain, palpitations and leg swelling.  Gastrointestinal: Positive for nausea. Negative for abdominal pain and vomiting.       Occasional nausea reported by patient; none today per patient report  Genitourinary: Negative.   Musculoskeletal: Negative.  Negative for falls.  Skin: Positive  for rash.       Patient has scabs noted over bilateral ears and head/ face.  Wife reports had to take to ED yesterday for unstoppable bleeding of ear scab that came off due to patient picking at scab  Neurological: Positive for tremors, seizures and weakness. Negative for dizziness.       Reports hx seizures; pt. Has significant tremors of bilateral UE secondary to Parkinson's disease   Endo/Heme/Allergies: Bruises/bleeds easily.       Patient on chronic anticoagulation  Psychiatric/Behavioral: Positive for depression. Negative for suicidal ideas. The patient is  nervous/anxious and has insomnia.     Physical Exam  Constitutional: He is oriented to person, place, and time. No distress.  Cardiovascular: Normal rate, regular rhythm, normal heart sounds and intact distal pulses.   Pulses:      Radial pulses are 2+ on the right side, and 2+ on the left side.  Wearing holter monitor, due to return monitor on 03/04/16  Respiratory: Effort normal. No respiratory distress. He has no wheezes. He has no rales.  Diminished bilateral breath sounds throughout A/L/P lung fields.  Although in no distress during visit, patient is short of breath with activity and recovers quickly with return to rest/ sitting in chair  GI: Soft. Bowel sounds are normal.  Musculoskeletal: He exhibits no edema.  Neurological: He is alert and oriented to person, place, and time.  Skin: Skin is warm and dry. Rash noted. No erythema.  See ROS  Psychiatric: His behavior is normal. Thought content normal.    Encounter Medications:   Outpatient Encounter Prescriptions as of 02/18/2016  Medication Sig Note  . acetaminophen (TYLENOL) 500 MG tablet Take 500 mg by mouth every 6 (six) hours as needed for headache (pain). 02/05/2016: As needed.   Marland Kitchen albuterol (PROVENTIL HFA;VENTOLIN HFA) 108 (90 Base) MCG/ACT inhaler Inhale 2 puffs into the lungs every 6 (six) hours as needed for wheezing or shortness of breath. (Patient taking differently: Inhale 2 puffs into the lungs every 4 (four) hours as needed for wheezing or shortness of breath. ) 02/05/2016: As needed.   Marland Kitchen aspirin EC 81 MG EC tablet Take 1 tablet (81 mg total) by mouth daily.   Marland Kitchen atorvastatin (LIPITOR) 40 MG tablet Take 1 tablet (40 mg total) by mouth daily at 6 PM.   . carbidopa-levodopa (SINEMET IR) 25-100 MG tablet Take 1 tablet by mouth 3 (three) times daily. 01/18/2016: Per spouse they ran out of this medication Friday and pt would not let her call pharmacy for refills   . Fluticasone-Salmeterol (ADVAIR DISKUS) 250-50 MCG/DOSE AEPB  Inhale 1 puff into the lungs every 12 (twelve) hours. (Patient taking differently: Inhale 1 puff into the lungs 2 (two) times daily as needed (for shortness of breath). )   . imiquimod (ALDARA) 5 % cream Apply 1 application topically 2 (two) times daily. 02/05/2016: Per spouse, pt start cream.   . levETIRAcetam (KEPPRA) 500 MG tablet Take 500 mg by mouth 2 (two) times daily.   Marland Kitchen losartan (COZAAR) 25 MG tablet Take 1 tablet (25 mg total) by mouth daily.   . metoprolol succinate (TOPROL-XL) 50 MG 24 hr tablet Take 1 tablet (50 mg total) by mouth daily. Take with or immediately following a meal.   . nitroGLYCERIN (NITROSTAT) 0.4 MG SL tablet Place 1 tablet (0.4 mg total) under the tongue every 5 (five) minutes x 3 doses as needed for chest pain. (Patient not taking: Reported on 02/05/2016) 02/05/2016: Available if needed.   Marland Kitchen  ondansetron (ZOFRAN ODT) 4 MG disintegrating tablet Take 1 tablet (4 mg total) by mouth every 8 (eight) hours as needed for nausea or vomiting. (Patient not taking: Reported on 02/05/2016) 02/05/2016: Available  If needed.   . ticagrelor (BRILINTA) 90 MG TABS tablet Take 1 tablet (90 mg total) by mouth 2 (two) times daily.    No facility-administered encounter medications on file as of 02/18/2016.     Functional Status:   In your present state of health, do you have any difficulty performing the following activities: 01/17/2016 12/14/2015  Hearing? Y N  Vision? Y Y  Difficulty concentrating or making decisions? Tempie Donning  Walking or climbing stairs? Y Y  Dressing or bathing? Y Y  Doing errands, shopping? Tempie Donning  Some recent data might be hidden    Fall/Depression Screening:    PHQ 2/9 Scores 02/04/2016  Exception Documentation Other- indicate reason in comment box  Not completed call completed with spouse    Assessment:  Although [name] appears to be physically recuperating from his recent NSTEMI, is significantly depressed with feelings of hopelessness after his recent  hospitalizations.  [name] appears to have adequate support through his wife.  [name] has not been taking his prescribed Keppra, which was ordered today through his pharmacy, and he needs re-assessment of his use of his inhalers.  [name] and his wife need further education on self-health management of HTN and cardiac disease.   Plan:   [name] will take his medications as they are prescribed and will attend all provider appointments.  [name] will discuss his depression and feelings of hopelessness with his PCP during next office visit scheduled for February 23, 2016.  [name] and/ or wife will contact his providers for any concerns, needs, issues, or problems that arise.  Kashad and his wife will begin recording his BP readings 2-3 times per week.  Kalonji and his wife will read over Advanced Directive information and low-sodium diet information provided to them today.  [place] outreach for transition of care to continue with scheduled phone call next week.  I will share notes from [facility] CM initial home visit with patient's PCP via EMR.   I appreciate the opportunity to participate in [name]'s care,  Oneta Rack, RN, BSN, [place] Coordinator [facility] Care Management  [phone]

## 2016-02-23 ENCOUNTER — Encounter: Payer: Self-pay | Admitting: Primary Care

## 2016-02-23 ENCOUNTER — Ambulatory Visit (INDEPENDENT_AMBULATORY_CARE_PROVIDER_SITE_OTHER): Payer: PPO | Admitting: Primary Care

## 2016-02-23 VITALS — BP 130/84 | HR 84 | Temp 98.3°F | Ht 69.0 in | Wt 148.8 lb

## 2016-02-23 DIAGNOSIS — F419 Anxiety disorder, unspecified: Secondary | ICD-10-CM

## 2016-02-23 DIAGNOSIS — F332 Major depressive disorder, recurrent severe without psychotic features: Secondary | ICD-10-CM | POA: Diagnosis not present

## 2016-02-23 DIAGNOSIS — J438 Other emphysema: Secondary | ICD-10-CM | POA: Diagnosis not present

## 2016-02-23 DIAGNOSIS — F418 Other specified anxiety disorders: Secondary | ICD-10-CM

## 2016-02-23 DIAGNOSIS — F329 Major depressive disorder, single episode, unspecified: Secondary | ICD-10-CM

## 2016-02-23 MED ORDER — ALBUTEROL SULFATE (2.5 MG/3ML) 0.083% IN NEBU: 2.5000 mg | INHALATION_SOLUTION | Freq: Four times a day (QID) | RESPIRATORY_TRACT | 1 refills | Status: DC | PRN

## 2016-02-23 MED ORDER — SERTRALINE HCL 25 MG PO TABS: 25.0000 mg | ORAL_TABLET | Freq: Every day | ORAL | 1 refills | Status: DC

## 2016-02-23 MED ORDER — BUDESONIDE 0.25 MG/2ML IN SUSP: 0.2500 mg | Freq: Two times a day (BID) | RESPIRATORY_TRACT | 2 refills | Status: DC

## 2016-02-23 NOTE — Assessment & Plan Note (Signed)
Cannot physically inhale Advair and albuterol inhalers. Switch to nebulized treatments today. Rx for Nebulizer machine, Pulmicort, and albuterol sent to pharmacy. Discussed indications for use of both pulmicort and albuterol.  Patient and his wife verbalized understanding.  Will see him back in 4 weeks for re-evaluation.

## 2016-02-23 NOTE — Assessment & Plan Note (Signed)
Recently evaluated and discovered by [facility] nurse. PHQ 9 score of 18 and GAD 7 score of 17 today.  Rx for Zoloft 25 mg sent to pharmacy. Patient is to take 1/2 tablet daily for 8 days, then advance to 1 full tablet thereafter. We discussed possible side effects of headache, GI upset, drowsiness, and SI/HI. If thoughts of SI/HI develop, we discussed to present to the emergency immediately. Patient verbalized understanding.   Follow up in 4 weeks for re-evaluation.

## 2016-02-23 NOTE — Patient Instructions (Addendum)
Take the prescription for the nebulizer machine to the pharmacy or a home supply store.  Use the budesonide (Pulmicort) nebulized solution twice daily, every day.  Use the albuterol nebulized solution every 6 hours as needed for wheezing or shortness of breath.   Start sertraline (Zoloft) 25 mg tablets for anxiety and depression. Take 1/2 tablet daily for 8 days then advance to 1 full tablet thereafter.  Please follow up in 4 weeks for re-evaluation.  It was a pleasure to see you today!

## 2016-02-23 NOTE — Progress Notes (Signed)
Subjective:    Patient ID: [name], male    DOB: [birth year], 75 y.o.   MRN: [id number]  HPI  [name] is a 75 year old male who presents today for follow up. We received a message from patient outreach nurse with recommendations for evaluation of depression and emphysema after a recent home visit. His wife is here today helping to contribute to the HPI.  1) Shortness of Breath/Emphysema: Currently managed on Advair and albuterol for emphysema. He cannot physically inhale his medications and therefore as not been able to take in medication. He does experience exertional shortness of breath and a chronic cough. Denies excess mucous production, wheezing. He has evidence of emphysema on xray from 2016. Former smoker, quit in 2006.  2) Anxiety and Depression: History of parkinson's disease, NSTEMI, COPD/Emysema, Seizure disorder. He recently underwent evaluation from [place] who notied symptoms of depression. Patient endorsed feeling depressed, tearfulness, stating that he "is tired of living". He had expressed noticing these symptoms since September 2017 after his hospitalization. He has had a lot of personal stress with family.   He was previously treated in the past for anxiety and depression and his wife believe he's been on Cymbalta in the past and recalls it was too expensive. GAD 7 score of 17 and PHQ 9 score of 18. He denies SI/HI today but has thought about this in the past.   Review of Systems  Constitutional: Negative for fever.  Respiratory: Positive for shortness of breath. Negative for wheezing.   Cardiovascular: Negative for chest pain and leg swelling.  Psychiatric/Behavioral: Positive for sleep disturbance. Negative for suicidal ideas. The patient is nervous/anxious.        Depression       Past Medical History:  Diagnosis Date  . Chest pain   . COPD (chronic obstructive pulmonary disease) ([place])   . Depression   . Diabetes ([place])   . Dysphagia   .  Essential tremor   . Heart disease   . Hyperlipidemia   . Hypertension   . Seizure disorder ([place])   . Tardive dyskinesia   . Vitamin D deficiency      Social History   Social History  . Marital status: Married    Spouse name: N/A  . Number of children: N/A  . Years of education: N/A   Occupational History  . Not on file.   Social History Main Topics  . Smoking status: Former Smoker    Quit date: 09/04/2004  . Smokeless tobacco: Never Used  . Alcohol use No  . Drug use: No  . Sexual activity: Not on file   Other Topics Concern  . Not on file   Social History Narrative   Married.   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   Enjoys spending time with family.     Past Surgical History:  Procedure Laterality Date  . CARDIAC CATHETERIZATION N/A 01/19/2016   Procedure: Left Heart Cath and Coronary Angiography;  Surgeon: Jolaine Artist, MD;  Location: [place] [facility];  Service: Cardiovascular;  Laterality: N/A;  . CARDIAC CATHETERIZATION  01/19/2016   Procedure: Coronary Stent Intervention;  Surgeon: Jettie Booze, MD;  Location: [place] [facility];  Service: Cardiovascular;;  . INGUINAL HERNIA REPAIR Left as child    Family History  Problem Relation Age of Onset  . Lung cancer Father   . CAD Brother     MI in his 69s     Allergies  Allergen  Reactions  . Levaquin [Levofloxacin In D5w] Nausea And Vomiting  . Penicillins Hives    Childhood allergic reaction per spouse - no other information available 01/18/16    Current Outpatient Prescriptions on File Prior to Visit  Medication Sig Dispense Refill  . acetaminophen (TYLENOL) 500 MG tablet Take 500 mg by mouth every 6 (six) hours as needed for headache (pain).    Marland Kitchen aspirin EC 81 MG EC tablet Take 1 tablet (81 mg total) by mouth daily. 30 tablet 0  . atorvastatin (LIPITOR) 40 MG tablet Take 1 tablet (40 mg total) by mouth daily at 6 PM. 30 tablet 6  . carbidopa-levodopa (SINEMET IR) 25-100 MG tablet Take  1 tablet by mouth 3 (three) times daily. 90 tablet 2  . imiquimod (ALDARA) 5 % cream Apply 1 application topically 2 (two) times daily.    Marland Kitchen levETIRAcetam (KEPPRA) 500 MG tablet Take 500 mg by mouth 2 (two) times daily.    Marland Kitchen losartan (COZAAR) 25 MG tablet Take 1 tablet (25 mg total) by mouth daily. 30 tablet 6  . metoprolol succinate (TOPROL-XL) 50 MG 24 hr tablet Take 1 tablet (50 mg total) by mouth daily. Take with or immediately following a meal. 30 tablet 6  . ticagrelor (BRILINTA) 90 MG TABS tablet Take 1 tablet (90 mg total) by mouth 2 (two) times daily. 60 tablet 0  . nitroGLYCERIN (NITROSTAT) 0.4 MG SL tablet Place 1 tablet (0.4 mg total) under the tongue every 5 (five) minutes x 3 doses as needed for chest pain. (Patient not taking: Reported on 02/23/2016) 25 tablet 3  . ondansetron (ZOFRAN ODT) 4 MG disintegrating tablet Take 1 tablet (4 mg total) by mouth every 8 (eight) hours as needed for nausea or vomiting. (Patient not taking: Reported on 02/23/2016) 20 tablet 0   No current facility-administered medications on file prior to visit.     BP 130/84   Pulse 84   Temp 98.3 F (36.8 C) (Oral)   Ht 5\' 9"  (1.753 m)   Wt 148 lb 12.8 oz (67.5 kg)   SpO2 94%   BMI 21.97 kg/m    Objective:   Physical Exam  Constitutional: He appears well-nourished. He does not appear ill.  Neck: Neck supple.  Cardiovascular: Normal rate and regular rhythm.   Pulmonary/Chest: Effort normal. He has no decreased breath sounds. He has wheezes in the right upper field. He has no rhonchi.  Skin: Skin is warm and dry.          Assessment & Plan:

## 2016-02-23 NOTE — Progress Notes (Signed)
Pre visit review using our clinic review tool, if applicable. No additional management support is needed unless otherwise documented below in the visit note. 

## 2016-02-26 ENCOUNTER — Encounter: Payer: Self-pay | Admitting: *Deleted

## 2016-02-26 ENCOUNTER — Other Ambulatory Visit: Payer: Self-pay | Admitting: *Deleted

## 2016-02-26 NOTE — Patient Outreach (Signed)
[facility] [facility]) Care Management [place] Telephone Outreach, Transition of Care day 22 02/26/2016  Aaronjames Enciso 05/13/1940 [id number]  Unsuccessful telephone outreach to [name], 75 y.o. male referred to [place] for transition of care after 2 recent hospitalizationsSeptember 3-5, 2017 for chest pain, confusion, cough and near syncope. Patient was re-admitted October 7-11, 2017 for NSTEMI and had stent placement on January 19, 2016. Patienthas a history including but not limited to, HTN, hyperlipedemia, COPD, seizure disorder, cardiac arrhythmias, and Parkinson's disease. Patient was discharged home from most recent hospitalization with [facility]) services recommended, which patient declined.   Automated outgoing VM msg stated that voice mail box has not been set up yet-- unable to leave message for patient.  Plan:  [place] telephone outreach for transition of care re-scheduled for tomorrow.  Oneta Rack, RN, BSN, [employer] [facility] Care Management  [phone]

## 2016-02-27 ENCOUNTER — Encounter: Payer: Self-pay | Admitting: *Deleted

## 2016-02-27 ENCOUNTER — Other Ambulatory Visit: Payer: Self-pay | Admitting: *Deleted

## 2016-02-27 NOTE — Patient Outreach (Signed)
[place] [facility]) Care Management [place] Telephone Outreach, Transition of care day 23 02/27/2016  Abdoulaye Maclellan 1940/06/16 [id number]  Successful telephone outreach to Vanessa Barbara, wife and caregiver, on [facility] written consent, for Junayd Salazar, 75 y.o.malereferred to [place] for transition of care after 2 recent hospitalizationsSeptember 3-5, 2048for chest pain, confusion, cough and near syncope. Patient was re-admitted October 7-11, 2017 for NSTEMI and had stent placement on January 19, 2016. Patienthas a history including but not limited to, HTN, hyperlipedemia, COPD, seizure disorder, cardiac arrhythmias, and Parkinson's disease. Patient was discharged home from most recent hospitalization with [facility]) services recommended, which patient declined. HIPAA/ identity verified with patient's spouse, Yaseen Reifel today.  Today, Katharine Look reports that Deontavious "is doing much better."  -- Attended PCP office visit on Monday 02/23/16, was put on anti-depressant; Katharine Look reports that patient has all meds, including (new) antidepressant and Keppra, and is taking all medications as prescribed. -- Monitoring and recording blood pressure at home 2-3 times/ week; Katharine Look states BP's running 130-140/70-80 consistently -- Still wearing heart monitor; due to be completed on 03/04/16 -- Adhering to heart healthy diet  Katharine Look denies further needs, problems, or concerns today, and confirms that she and patient have my direct telephone number, [facility] CM main office number, and [facility] 24-hour nurse advice line phone number.  We confirmed upcoming scheduled [place] in-home visit for next month.  Plan:   Davidwill take his medications as they are prescribed and will attend all provider appointments.  Colwyn and/ or wife will contact his providers for any concerns, needs, issues, or problems that arise.  Rohit and his wife will continue recording  his BP readings 2-3 times per week.  [place] outreach for transition of care to continue with scheduled phone call next week, in-home visit next month.  Oneta Rack, RN, BSN, [employer] [facility] Care Management  [phone]

## 2016-03-02 ENCOUNTER — Ambulatory Visit (INDEPENDENT_AMBULATORY_CARE_PROVIDER_SITE_OTHER): Payer: PPO | Admitting: Family Medicine

## 2016-03-02 ENCOUNTER — Encounter: Payer: Self-pay | Admitting: *Deleted

## 2016-03-02 ENCOUNTER — Other Ambulatory Visit: Payer: Self-pay | Admitting: *Deleted

## 2016-03-02 ENCOUNTER — Encounter: Payer: Self-pay | Admitting: Family Medicine

## 2016-03-02 VITALS — BP 124/70 | HR 73 | Temp 97.3°F | Wt 148.0 lb

## 2016-03-02 DIAGNOSIS — J209 Acute bronchitis, unspecified: Secondary | ICD-10-CM

## 2016-03-02 MED ORDER — DOXYCYCLINE HYCLATE 100 MG PO TABS: 100.0000 mg | ORAL_TABLET | Freq: Two times a day (BID) | ORAL | 0 refills | Status: DC

## 2016-03-02 MED ORDER — IPRATROPIUM-ALBUTEROL 0.5-2.5 (3) MG/3ML IN SOLN
3.0000 mL | Freq: Once | RESPIRATORY_TRACT | Status: AC
  Administered 2016-03-02: 3 mL via RESPIRATORY_TRACT

## 2016-03-02 MED ORDER — ALBUTEROL SULFATE (2.5 MG/3ML) 0.083% IN NEBU
2.5000 mg | INHALATION_SOLUTION | Freq: Once | RESPIRATORY_TRACT | Status: AC
  Administered 2016-03-02: 2.5 mg via RESPIRATORY_TRACT

## 2016-03-02 NOTE — Addendum Note (Signed)
Addended by: Mod[place] Nunnery on: 03/02/2016 09:51 AM   Modules accepted: Orders

## 2016-03-02 NOTE — Progress Notes (Signed)
SUBJECTIVE:  [name] is a 75 y.o. male pt of Allie Bossier, new to me, who complains of coryza, congestion and productive cough for 6 days. He denies a history of anorexia and chest pain and admits to a history of asthma. Patient denies smoke cigarettes.   Of note, he is on Pulmicort with as needed pro air for emphysema but he cannot afford the nebulizer so he has not been using them.  Current Outpatient Prescriptions on File Prior to Visit  Medication Sig Dispense Refill  . acetaminophen (TYLENOL) 500 MG tablet Take 500 mg by mouth every 6 (six) hours as needed for headache (pain).    Marland Kitchen albuterol (PROVENTIL) (2.5 MG/3ML) 0.083% nebulizer solution Take 3 mLs (2.5 mg total) by nebulization every 6 (six) hours as needed for wheezing or shortness of breath. 150 mL 1  . aspirin EC 81 MG EC tablet Take 1 tablet (81 mg total) by mouth daily. 30 tablet 0  . atorvastatin (LIPITOR) 40 MG tablet Take 1 tablet (40 mg total) by mouth daily at 6 PM. 30 tablet 6  . budesonide (PULMICORT) 0.25 MG/2ML nebulizer solution Take 2 mLs (0.25 mg total) by nebulization 2 (two) times daily. 60 mL 2  . carbidopa-levodopa (SINEMET IR) 25-100 MG tablet Take 1 tablet by mouth 3 (three) times daily. 90 tablet 2  . imiquimod (ALDARA) 5 % cream Apply 1 application topically 2 (two) times daily.    Marland Kitchen levETIRAcetam (KEPPRA) 500 MG tablet Take 500 mg by mouth 2 (two) times daily.    Marland Kitchen losartan (COZAAR) 25 MG tablet Take 1 tablet (25 mg total) by mouth daily. 30 tablet 6  . metoprolol succinate (TOPROL-XL) 50 MG 24 hr tablet Take 1 tablet (50 mg total) by mouth daily. Take with or immediately following a meal. 30 tablet 6  . nitroGLYCERIN (NITROSTAT) 0.4 MG SL tablet Place 1 tablet (0.4 mg total) under the tongue every 5 (five) minutes x 3 doses as needed for chest pain. 25 tablet 3  . ondansetron (ZOFRAN ODT) 4 MG disintegrating tablet Take 1 tablet (4 mg total) by mouth every 8 (eight) hours as needed for nausea or  vomiting. 20 tablet 0  . sertraline (ZOLOFT) 25 MG tablet Take 1 tablet (25 mg total) by mouth daily. 30 tablet 1  . ticagrelor (BRILINTA) 90 MG TABS tablet Take 1 tablet (90 mg total) by mouth 2 (two) times daily. 60 tablet 0   No current facility-administered medications on file prior to visit.     Allergies  Allergen Reactions  . Levaquin [Levofloxacin In D5w] Nausea And Vomiting  . Penicillins Hives    Childhood allergic reaction per spouse - no other information available 01/18/16    Past Medical History:  Diagnosis Date  . Chest pain   . COPD (chronic obstructive pulmonary disease) ([place])   . Depression   . Diabetes ([place])   . Dysphagia   . Essential tremor   . Heart disease   . Hyperlipidemia   . Hypertension   . Seizure disorder ([place])   . Tardive dyskinesia   . Vitamin D deficiency     Past Surgical History:  Procedure Laterality Date  . CARDIAC CATHETERIZATION N/A 01/19/2016   Procedure: Left Heart Cath and Coronary Angiography;  Surgeon: Jolaine Artist, MD;  Location: [facility];  Service: Cardiovascular;  Laterality: N/A;  . CARDIAC CATHETERIZATION  01/19/2016   Procedure: Coronary Stent Intervention;  Surgeon: Jettie Booze, MD;  Location: [facility];  Service: Cardiovascular;;  . INGUINAL HERNIA REPAIR Left as child    Family History  Problem Relation Age of Onset  . Lung cancer Father   . CAD Brother     MI in his 42s     Social History   Social History  . Marital status: Married    Spouse name: N/A  . Number of children: N/A  . Years of education: N/A   Occupational History  . Not on file.   Social History Main Topics  . Smoking status: Former Smoker    Quit date: 09/04/2004  . Smokeless tobacco: Never Used  . Alcohol use No  . Drug use: No  . Sexual activity: Not on file   Other Topics Concern  . Not on file   Social History Narrative   Married.   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   Enjoys spending time  with family.    The PMH, PSH, Social History, Family History, Medications, and allergies have been reviewed in [facility], and have been updated if relevant.  OBJECTIVE: BP 124/70   Pulse 73   Temp 97.3 F (36.3 C) (Oral)   Wt 148 lb (67.1 kg)   SpO2 90%   BMI 21.86 kg/m   He appears well, vital signs are as noted. Ears normal.  Throat and pharynx normal.  Neck supple. No adenopathy in the neck. Nose is congested. Sinuses non tender. He has wheezes throughout with scattered rhonchi  ASSESSMENT:  bronchitis  PLAN: In a high risk patient- will treat with doxycyline 100 mg twice daily x 10 days, given duonebs in the office.  Symptomatic therapy suggested: push fluids, rest and return office visit prn if symptoms persist or worsen.  Call or return to clinic prn if these symptoms worsen or fail to improve as anticipated.

## 2016-03-02 NOTE — Progress Notes (Signed)
Pre visit review using our clinic review tool, if applicable. No additional management support is needed unless otherwise documented below in the visit note. 

## 2016-03-02 NOTE — Patient Instructions (Signed)
Nice to meet you. Happy Holidays.  Please take doxycyline as directed- 1 tablet twice daily for 10 days.  Follow up with Allie Bossier  Next week.

## 2016-03-02 NOTE — Patient Outreach (Signed)
[facility]) Care Management [place] Telephone Outreach, Transition of Care day 27 03/02/2016  Christoper Nogueras 05/12/1940 [id number]  Successful telephone outreach to Vanessa Barbara, wife and caregiver, on [facility] written consent, for Trayce Kees Barbar,75 y.o.malereferred to [place] for transition of care after 2 recent hospitalizationsSeptember 3-5, 2032for chest pain, confusion, cough and near syncope. Patient was re-admitted October 7-11, 2017 for NSTEMI and had stent placement on January 19, 2016. Patienthas a history including but not limited to, HTN, hyperlipedemia, COPD, seizure disorder, cardiac arrhythmias, and Parkinson's disease. Patient was discharged home from most recent hospitalization with [facility]) services recommended, which patient declined. HIPAA/ identity verified with patient's spouse, Jayni Ellwanger today.  Today, Katharine Look reports that Damir "is not doing very good, he has a bad cold and I had to take him to the doctor this morning."  -- Attended urgent PCP office visit this morning, was diagnosed with bronchitis, Doxycycline started BID x 10 days.  Wife reports that they have started antibiotics as instructed.  Wife stated that patient has been sick "since Friday," but would not allow her to contact the doctor sooner than this morning.  Encouraged wife to relay to patient to contact providers promptly for any new concerns.    Today, Katharine Look stated that patient has not been taking his nebulizer medications because "we can't afford to buy them... Our insurance won't cover that medication."  During previous [place] initial home visit (02/18/16), patient/ wife had reported that patient could not use his nebulizer because he "didn't have enough wind" to properly use device; there was no mention of concern with the cost of the medication at that time.  I made patient's PCP aware of his reports of not being able to use HHN on  02/18/16.  Today, I offered to make [place] referral for medication cost assistance evaluation, and Katharine Look agreed to this.  -- Anti-depressant started during last scheduled PCP office visit "still helping a lot," per report of wife.  Katharine Look states that she can "tell there is a big difference, he is not as sad and upset as he was."  Katharine Look reports that patient has all meds, including new antibiotic, new antidepressant, and Keppra, and is taking all medications as prescribed.     -- Monitoring and recording blood pressure at home 2-3 times/ week; Katharine Look states BP today was "130/72."  -- Still wearing heart monitor; due to be completed on 03/04/16  -- Adhering to heart healthy diet, "as best as we can."  Katharine Look denies further needs, problems, or concerns today, and confirms that she and patient have my direct telephone number, [facility] CM main office number, and [facility] 24-hour nurse advice line phone number.    Plan:   Davidwill take his medications as they are prescribed and will attend all provider appointments.  Davidand/ or wife will contact his providers for any concerns, needs, issues, or problems that arise.  Jeryn and his wife will continue recording his BP readings 2-3 times per week.  I will make [place] referral for medication cost assistance evaluation.  [place] outreach for transition of care to continue with scheduled phone callnext week, in-home visit next month.  Oneta Rack, RN, BSN, [employer] [facility] Care Management  [phone]

## 2016-03-08 ENCOUNTER — Encounter: Payer: Self-pay | Admitting: *Deleted

## 2016-03-08 ENCOUNTER — Other Ambulatory Visit: Payer: Self-pay | Admitting: *Deleted

## 2016-03-08 NOTE — Patient Outreach (Signed)
[place] [facility] [facility]) Care Management [place] Telephone Outreach, Transition of Care day 33 03/08/2016  Jamaal Stager January 14, 1941 [id number]  Successful telephone outreach to Vanessa Barbara, wife and caregiver, on [facility] written consent, for Lovis Calisto Dralle,75 y.o.malereferred to [place] for transition of care after 2 recent hospitalizationsSeptember 3-5, 207for chest pain, confusion, cough and near syncope. Patient was re-admitted October 7-11, 2017 for NSTEMI and had stent placement on January 19, 2016. Patienthas a history including but not limited to, HTN, hyperlipedemia, COPD, seizure disorder, cardiac arrhythmias, and Parkinson's disease. Patient was discharged home from most recent hospitalization with [facility]) services recommended, which patient declined. HIPAA/ identity verified with patient's spouse, Kameel Taitano today.  Today, Katharine Look reports that Crit "is doing better, his cold and bronchitis is slowly improving."  -- has been taking Doxycycline started BID x 10 days as prescribed.  Encouraged wife/ patient to contact providers promptly for any new concerns or issues.    -- Anti-depressant started during last scheduled PCP office visit "still helping a lot," per report of wife.  Katharine Look reports that patient has all meds, including newly prescribed antibiotic for bronchitis, new antidepressant, and Keppra, and is taking all medications as prescribed.   Denies questions or issues around medications today; shared with sandra that [place] referral had been placed, and that she would be hearing from [place] staff.  -- Monitoring and recording blood pressure at home 2-3 times/ week; Katharine Look states BP today was "138/70."  -- home heart monitor completed, patient no longer wearing  -- Adhering to heart healthy diet, "as best as we can."  Katharine Look denies further needs, problems, or concerns today, and confirms that  she and patient have my direct telephone number, [facility] CM main office number, and [facility] 24-hour nurse advice line phone number.   Plan:   Davidwill take his medications as they are prescribed and will attend all provider appointments.  Davidand/ or wife will contact his providers for any concerns, needs, issues, or problems that arise.  Taurin and his wife will continuerecording his BP readings 2-3 times per week.  [place] outreach to continue with in-home visit next week.  Oneta Rack, RN, BSN, [employer] [facility] Care Management  [phone]

## 2016-03-16 ENCOUNTER — Encounter: Payer: Self-pay | Admitting: *Deleted

## 2016-03-16 ENCOUNTER — Other Ambulatory Visit: Payer: Self-pay | Admitting: *Deleted

## 2016-03-16 NOTE — Patient Outreach (Signed)
[facility]) Care Management  [place] routine Home Visit 03/16/2016  [name] 09/27/1940 [id number]  [name] is an 75 y.o. male referred to [place] for transition of care after 2 recent hospitalizationsSeptember 3-5, 2025for chest pain, confusion, cough and near syncope. Patient was re-admitted October 7-11, 2017 for NSTEMI and had stent placement on January 19, 2016. Patienthas a history including but not limited to, HTN, hyperlipedemia, COPD, seizure disorder, cardiac arrhythmias, and Parkinson's disease. Patient was discharged home from most recent hospitalization with [facility]) services recommended, which patient declined. Patient completed most recent transition of care without hospital re-admission.  Today, [name] reports that he "is doing some better, but not as good as I would like."  -- completed Doxycycline started BID x 10 days as prescribed during PCP office visit 03/02/16. [name] report that patient continues having slight "sinus" issues, question if patient needs "another round" of antibiotics.  Patient has not made/ attended follow up provider appointment, as directed during last office visit when Doxy was prescribed.  Encouraged [name]/ patient to make/ attend follow up appointment with PCP and to contact providers promptly for any new concerns or issues. [name] state that patient "becomes very short of breath just walking to car," which patient and [name] attribute to recent bronchitis.  Both state, "it is worse than it used to be before the bronchitis."  Patient is not in respiratory distress during today's in-home visit.  -- Anti-depressant started during PCP office visit 02/23/16 "still helping a lot," per report of [name], although patient states, "I don't really think it's doing much good," and reports that he still feels depressed.  Patient is not teary and did not cry at all during today's  in-home visit.  Again encouraged patient to make/ attend follow up PCP visit to monitor/ assess progress since placed on antidepressants.  -- reports that patient has all meds, and is taking all medications as prescribed "except for [facility]" which patient reports he cannot afford. [name] continues administering and managing patient's daily oral medications, without use of pill box.  Denies questions or issues around medications today; confirmed that [name] referral had been placed during last [place] in-home visit, and that she would be hearing from [name] staff for evaluation of medications assistance needs.  [name] states that she is confused about whether or not patient should be on 90-day supply of Jay medications to help save money.  -- Monitoring and recording blood pressure at home2-3 times/ week; [name] reports BP ranges "125-138/70-80."  -- home heart monitor completed, patient no longer wearing; cardiology provider appointment follow up visit scheduled for 03/27/16  -- Adhering to heart healthy diet, "as best as we can."  [name] reports that they "should could use assistance with the cost of food."  States that they get $16.00 per week in food stamps.  Food pantry brochure provided to patient and [name]; [name] reports that their car is currently "in the shop," and hopes that it will be fixed soon, as this is their only means of transportation; will place [facility] CSW referral for further evaluation of needs.  Patient and [name] deny further needs, problems, or concerns today, and confirms that she and patient have my direct telephone number, [name] main office number, and [name] phone number.   Subjective: "I'm doing some better, but I am still not as good as I'd like to be."  Objective:  BP 118/64   Pulse 68   Resp 16   SpO2 98%    Review of Systems  Constitutional: Negative.   HENT: Positive for congestion and sinus pain.         Occasional sinus pain and congestion reported, "ever since I got bronchitis last month."  Respiratory: Positive for cough and shortness of breath. Negative for sputum production and wheezing.        Reports occasional cough (none demonstrated today during visit); reports SOB with activity, none noted during in-home visit today.  Cardiovascular: Positive for chest pain. Negative for palpitations and leg swelling.       Patient reports lateral chest soreness (bilaterally) that radiates occasionally into lower back, states, "ever since I have had this bronchitis."  Gastrointestinal: Positive for nausea.       Occasional nausea; none reported today  Musculoskeletal: Negative for falls.  Neurological: Positive for tremors.       Hx Parkinson's Disease  Psychiatric/Behavioral: Positive for depression. The patient is not nervous/anxious.        "Depression some better" since medication added; patient is not teary or crying during today's home visit    Physical Exam  Constitutional: He is oriented to person, place, and time. He appears well-developed and well-nourished. No distress.  Cardiovascular: Normal rate, regular rhythm, normal heart sounds and intact distal pulses.   Respiratory: Effort normal and breath sounds normal. No respiratory distress. He has no wheezes. He has no rales.  Respirations slightly shallow; patient reports "i just can't get wind the way I should."  GI: Soft. Bowel sounds are normal.  Musculoskeletal: He exhibits no edema.  Neurological: He is alert and oriented to person, place, and time.  Skin: Skin is warm and dry.  Skin lesions on head, ears, and face appear to be healing, and are decreased in number since last [name] in-home visit  Psychiatric: He has a normal mood and affect. Jay behavior is normal. Judgment and thought content normal.    Encounter Medications:   Outpatient Encounter Prescriptions as of 03/16/2016  Medication Sig Note  . acetaminophen  (TYLENOL) 500 MG tablet Take 500 mg by mouth every 6 (six) hours as needed for headache (pain). 02/05/2016: As needed.   Marland Kitchen aspirin EC 81 MG EC tablet Take 1 tablet (81 mg total) by mouth daily.   Marland Kitchen atorvastatin (LIPITOR) 40 MG tablet Take 1 tablet (40 mg total) by mouth daily at 6 PM.   . carbidopa-levodopa (SINEMET IR) 25-100 MG tablet Take 1 tablet by mouth 3 (three) times daily. 01/18/2016: Per spouse they ran out of this medication Friday and pt would not let her call pharmacy for refills   . imiquimod (ALDARA) 5 % cream Apply 1 application topically 2 (two) times daily. 02/05/2016: Per spouse, pt start cream.   . levETIRAcetam (KEPPRA) 500 MG tablet Take 500 mg by mouth 2 (two) times daily.   Marland Kitchen losartan (COZAAR) 25 MG tablet Take 1 tablet (25 mg total) by mouth daily.   . metoprolol succinate (TOPROL-XL) 50 MG 24 hr tablet Take 1 tablet (50 mg total) by mouth daily. Take with or immediately following a meal.   . nitroGLYCERIN (NITROSTAT) 0.4 MG SL tablet Place 1 tablet (0.4 mg total) under the tongue every 5 (five) minutes x 3 doses as needed for chest pain. 02/05/2016: Available if needed.   . ondansetron (ZOFRAN ODT) 4 MG disintegrating tablet Take 1 tablet (4 mg total) by mouth every 8 (eight) hours  as needed for nausea or vomiting. 02/05/2016: Available  If needed.   . sertraline (ZOLOFT) 25 MG tablet Take 1 tablet (25 mg total) by mouth daily.   . ticagrelor (BRILINTA) 90 MG TABS tablet Take 1 tablet (90 mg total) by mouth 2 (two) times daily.   Marland Kitchen albuterol (PROVENTIL) (2.5 MG/3ML) 0.083% nebulizer solution Take 3 mLs (2.5 mg total) by nebulization every 6 (six) hours as needed for wheezing or shortness of breath. (Patient not taking: Reported on 03/16/2016)   . budesonide (PULMICORT) 0.25 MG/2ML nebulizer solution Take 2 mLs (0.25 mg total) by nebulization 2 (two) times daily. (Patient not taking: Reported on 03/16/2016)   . doxycycline (VIBRA-TABS) 100 MG tablet Take 1 tablet (100 mg total) by  mouth 2 (two) times daily. (Patient not taking: Reported on 03/16/2016) 03/16/2016: Patient completed this medication; reports took as prescribed   No facility-administered encounter medications on file as of 03/16/2016.     Functional Status:   In your present state of health, do you have any difficulty performing the following activities: 02/18/2016 01/17/2016  Hearing? N Y  Vision? N Y  Difficulty concentrating or making decisions? [name]  Walking or climbing stairs? Y Y  Dressing or bathing? Y Y  Doing errands, shopping? [name]  Preparing Food and eating ? Y -  Using the Toilet? N -  In the past six months, have you accidently leaked urine? N -  Do you have problems with loss of bowel control? N -  Managing your Medications? Y -  Managing your Finances? Y -  Housekeeping or managing your Housekeeping? Y -  Some recent data might be hidden    Fall/Depression Screening:    PHQ 2/9 Scores 02/23/2016 02/18/2016 02/04/2016  PHQ - 2 Score 6 6 -  PHQ- 9 Score 18 18 -  Exception Documentation - - Other- indicate reason in comment box  Not completed - - call completed with spouse    Assessment:  [name] continues to appear to be physically recuperating from Jay recent NSTEMI, and seems less depressed/ hopeless after Jay recent hospitalizations since he has started taking prescribed antidepressant.  [name] appears to have adequate support of Jay care needs through [name], although they report that they need assistance with the cost of food and medications.  [name] reports taking all of Jay prescribed oral medications, and he needs re-assessment of Jay use of Jay inhalers, and reports he is  unable to afford Jay prescribed nebulizer medications.  [name] and [name] need ongoing reinforcement of self-health management of HTN and cardiac disease.   Plan:   [name] take Jay medications as they are prescribed and will work with [place] for evaluation of medication assistance needs.  [name] will  make and attend a follow up PCP provider appointment to discuss the effectiveness of Jay newly prescribed antidepressant therapy, and for follow up of Jay recent acute episode of bronchitis.  [name] will attend all scheduled provider appointments.  [name] will contact Jay providers for any concerns, needs, issues, or problems that arise.  [name] and [name] will continuerecording Jay BP readings 2-3 times per week, and limiting salt intake.  I will place [facility] CSW referral to assess/ evaluate for food assistance and potential transportation assistance.  [place] outreach to continue with in-home visit next month.  Oneta Rack, RN, BSN, [employer] [facility] Care Management  [phone]

## 2016-03-18 ENCOUNTER — Other Ambulatory Visit: Payer: Self-pay | Admitting: Pharmacist

## 2016-03-18 ENCOUNTER — Telehealth: Payer: Self-pay | Admitting: *Deleted

## 2016-03-18 NOTE — Telephone Encounter (Signed)
-----  Message from Dorothy Spark, MD sent at 03/18/2016  4:10 PM EST ----- Do we have any free samples? Thank you, KN  ----- Message ----- From: Lin Givens, [place] Sent: 03/18/2016   4:07 PM To: Dorothy Spark, MD  Dr Meda Coffee:  Please see note from [facility] Pharmacist phone call with patient's spouse.  He has appointment with you 04/06/16 and reports to have enough Brilinta until 03/24/16---states difficulty affording co-pay.    Brilinta Tier 3---$45/30 days and clopidogrel Tier 1---$4/30 days.  If therapeutically appropriate, please consider use of clopidogrel given affordability issue with Brilinta for patient.   He has not met requirement for manufacturer patient assistance per spouse report.   Thanks for your time.  Karrie Meres, PharmD, [place] [phone]

## 2016-03-18 NOTE — Telephone Encounter (Signed)
Samples left at the front desk and wife is aware they are available to pick-up.  Informed the pts wife that this will get the pt by until he see's Dr Meda Coffee on 12/26.  Pt verbalized understanding and gracious for all the assistance provided.

## 2016-03-18 NOTE — Patient Outreach (Signed)
[facility] ([facility]) Care Management  03/18/2016  [name] 11/20/1940 [id number]  Patient was referred to [facility], by Laine, [facility] RN Community for evaluation of patient assistance options.   Phone call to patient, no answer, spouse returned call immediately, she is on [facility] Consent form and verified patient's HIPAA details.   Spouse reports they do not have a nebulizer machine, states local pharmacy doesn't bill insurance, and states difficulty affording co-pay for Brilinta. She did not wish to review medications with [facility] Pharmacist over phone or via home visit and states she can manage patient's medications.   She states patient has enough Brilinta to last until 03/24/16 and reports she has a co-pay savings card.  Counseled spouse, patient may not be able to use co-pay card due to him having Medicare Part D.    Phone call to [facility] and was told they indeed do not bill medicare for nebulizer.  Contacted patient's insurance [employer], to determine in-network DME suppliers.    Phone call back to patient's spouse, HIPAA verified.  Explained to patient per HTA, in-network DME suppliers include [facility], [facility], and [facility].  Spouse was encouraged to contact DME companies regarding obtaining a nebulizer and nebulized mediations.    Medication assistance:   Brilinta is Tier 3, $45/30 day supply. Clopidogrel is Tier 1, $4/30 day supply.  If therapeutically appropriate, consider use of clopidogrel given affordability issue of Brilinta.   Spouse reports patient has not met out-of-pocket spend requirement to apply to manufacturer patient assistance.    Discussed [employer] Extra Help program, spouse isn't sure of household income.  Encouraged to apply if she thinks patient meets requirements.    Plan:  1) Will route note to PCP regarding patient/spouse needing to select a DME supplier for nebulizer.   2) Will send  message to cardiology, he has an appointment with Dr Nelson 04/06/16.      3) Will follow-up via phone with patient/spouse next week.  Kong Packett, PharmD, BCACP Clinical Pharmacist [facility] [phone]     

## 2016-03-22 ENCOUNTER — Telehealth: Payer: Self-pay | Admitting: *Deleted

## 2016-03-22 NOTE — Telephone Encounter (Signed)
-----  Message from Dorothy Spark, MD sent at 03/19/2016  9:17 AM EST -----   ----- Message ----- From: Leeroy Bock, [facility] Sent: 03/19/2016   8:18 AM To: Dorothy Spark, MD, Nuala Alpha, LPN, #  Lennette Bihari,  We can leave Brilinta samples at [facility] to cover pt through the end of December. Please let him know that he can pick them up any time between 7:30am and 4:30pm Monday through Friday. Per Dr Meda Coffee, pt can switch to Plavix starting in January. We will have her nurse send in a prescription for pt.  Thanks, Jinny Blossom  ----- Message ----- From: Dorothy Spark, MD Sent: 03/18/2016   5:16 PM To: Leeroy Bock, RPH  Till end of this year, thank you  ----- Message ----- From: Leeroy Bock, [facility] Sent: 03/18/2016   4:51 PM To: Dorothy Spark, MD  We do have samples of Brilinta - usually not enough for patients to stay on samples indefinitely. Is there a certain length of time you want the pt to stay on Brilinta before potentially transitioning to Plavix? We could likely give them ~1 month supply of samples, just let me know.  Megan    ----- Message ----- From: Dorothy Spark, MD Sent: 03/18/2016   4:10 PM To: Nuala Alpha, LPN, Leeroy Bock, RPH  Do we have any free samples? Thank you, KN  ----- Message ----- From: Lin Givens, [place] Sent: 03/18/2016   4:07 PM To: Dorothy Spark, MD  Dr Meda Coffee:  Please see note from [facility] Pharmacist phone call with patient's spouse.  He has appointment with you 04/06/16 and reports to have enough Brilinta until 03/24/16---states difficulty affording co-pay.    Brilinta Tier 3---$45/30 days and clopidogrel Tier 1---$4/30 days.  If therapeutically appropriate, please consider use of clopidogrel given affordability issue with Brilinta for patient.   He has not met requirement for manufacturer patient assistance per spouse report.   Thanks for your time.  Karrie Meres, PharmD, [place] [phone]

## 2016-03-22 NOTE — Telephone Encounter (Signed)
Samples were left at the front desk for this pts wife to pick up, last Friday.   Pt and wife aware of this.  There is enough to get him through the beginning of the year.

## 2016-03-23 ENCOUNTER — Ambulatory Visit: Payer: PPO | Admitting: Neurology

## 2016-03-23 ENCOUNTER — Other Ambulatory Visit: Payer: Self-pay | Admitting: *Deleted

## 2016-03-23 ENCOUNTER — Other Ambulatory Visit: Payer: Self-pay | Admitting: Pharmacist

## 2016-03-23 NOTE — Patient Outreach (Signed)
[facility]) Care Management  03/23/2016  [name] [birth year] [id number]   RNCM Reginia Naas referred patient to this social worker to provide community resources for food pantries and possible transportation. This social worker spoke to patient's spouse, [name] who is on the [facility]  Consent form. Per patient's wife, patient is not feeling well, she thinks it is Bronchitis and plans to take patient to the doctor tomorrow.  She stated that they do not have transportation issues at this time, however they do have  Issues where they do not have enough food to last them the entire month.  Per patient's spouse, patient is a very "picky eater" hamburgers, cheese dogs, soup-he does not like chicken.  Per patient's spouse, they receive $16.00 per month in food stamps which does not cover a lot of their food expenses. They use their social security income to supplement the rest of their food needs.  Per patient's spouse, she was given a list of food pantries in [place]  by [facility] .  This Education officer, museum provided patient's spouse with a list of Food Bank resources in [place], [employer] and [employer].  Per patient's spouse, she has enough food for now but will contact these programs when needed.  [name] verbalized no additional social work needs at this time, patient's spouse encouraged to contact this social worker if any needs arise in the future.   [name] [facility] Care Management [phone]

## 2016-03-23 NOTE — Patient Outreach (Signed)
[facility]) Care Management  03/23/2016  [name] 1941-01-24 [id number]   Successful phone follow-up with patient's spouse, on [facility] Consent form.  HIPAA verified by patient's spouse.    Spouse reports she was able to obtain Brilinta samples from cardiology for the rest of year.  She states she has not yet reached out to the DME companies for patient's nebulizer.   Reminded spouse that per phone call to patient's local pharmacy, [facility] reportedly doesn't bill Medicare for the nebulizer.  Encouraged spouse to contact DME companies to inquire on the cost of nebulizer and medication.   Plan:  Will make an outreach call to patient's spouse next week to see if they have further pharmacy needs.   Karrie Meres, PharmD, [place] [phone]

## 2016-03-24 ENCOUNTER — Ambulatory Visit (INDEPENDENT_AMBULATORY_CARE_PROVIDER_SITE_OTHER): Payer: PPO | Admitting: Primary Care

## 2016-03-24 ENCOUNTER — Encounter: Payer: Self-pay | Admitting: Primary Care

## 2016-03-24 VITALS — BP 126/82 | HR 75 | Temp 97.3°F | Wt 143.0 lb

## 2016-03-24 DIAGNOSIS — F321 Major depressive disorder, single episode, moderate: Secondary | ICD-10-CM

## 2016-03-24 DIAGNOSIS — J438 Other emphysema: Secondary | ICD-10-CM

## 2016-03-24 MED ORDER — ALBUTEROL SULFATE (2.5 MG/3ML) 0.083% IN NEBU: 2.5000 mg | INHALATION_SOLUTION | Freq: Two times a day (BID) | RESPIRATORY_TRACT | 1 refills | Status: DC

## 2016-03-24 MED ORDER — SERTRALINE HCL 50 MG PO TABS: 50.0000 mg | ORAL_TABLET | Freq: Every day | ORAL | 1 refills | Status: DC

## 2016-03-24 NOTE — Progress Notes (Signed)
Subjective:    Patient ID: [name], male    DOB: 11/05/1940, 75 y.o.   MRN: [id number]  HPI  [name] is a 75 year old male with a history of COPD, parkinson's disease, seizure, disorder who presents today for follow up.   1) Cough/Shortness of Breath: He is currently managed on Pulmicort and Albuterol nebulized solution that was prescribed in November 2017 as he could not physically inhale his Advair and albuterol inhalers.   Since his last visit they've not gotten the neblized medications or machine due to cost. They can afford the albuterol but cannot afford the Pulmicort. He continues to cough which is non productive, and continues to experience exertional shortness of breath. He denies fevers, chills, congestion.   2) Depression: He was initiated on Zoloft 25 mg last visit for symptoms of tearfulness, feeling down, little interest in doing things. His wife has noticed some improvement in symptoms but thinks he may need an increased dose. He has noticed a little improvement. His [facility] nurse has noticed improvement as well. He denies SI/HI.  Review of Systems  Constitutional: Negative for fever.  HENT: Negative for congestion.   Respiratory: Positive for cough and shortness of breath.   Cardiovascular: Negative for chest pain.  Psychiatric/Behavioral: Negative for suicidal ideas.       Improved, but still having some symptoms.       Past Medical History:  Diagnosis Date  . Chest pain   . COPD (chronic obstructive pulmonary disease) ([place])   . Depression   . Diabetes ([place])   . Dysphagia   . Essential tremor   . Heart disease   . Hyperlipidemia   . Hypertension   . Seizure disorder ([place])   . Tardive dyskinesia   . Vitamin D deficiency      Social History   Social History  . Marital status: Married    Spouse name: N/A  . Number of children: N/A  . Years of education: N/A   Occupational History  . Not on file.   Social History Main Topics  . Smoking  status: Former Smoker    Quit date: 09/04/2004  . Smokeless tobacco: Never Used  . Alcohol use No  . Drug use: No  . Sexual activity: Not on file   Other Topics Concern  . Not on file   Social History Narrative   Married.   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   Enjoys spending time with family.     Past Surgical History:  Procedure Laterality Date  . CARDIAC CATHETERIZATION N/A 01/19/2016   Procedure: Left Heart Cath and Coronary Angiography;  Surgeon: Jolaine Artist, MD;  Location: [place] [facility];  Service: Cardiovascular;  Laterality: N/A;  . CARDIAC CATHETERIZATION  01/19/2016   Procedure: Coronary Stent Intervention;  Surgeon: Jettie Booze, MD;  Location: [place] [facility];  Service: Cardiovascular;;  . INGUINAL HERNIA REPAIR Left as child    Family History  Problem Relation Age of Onset  . Lung cancer Father   . CAD Brother     MI in his 75s     Allergies  Allergen Reactions  . Levaquin [Levofloxacin In D5w] Nausea And Vomiting  . Penicillins Hives    Childhood allergic reaction per spouse - no other information available 01/18/16    Current Outpatient Prescriptions on File Prior to Visit  Medication Sig Dispense Refill  . acetaminophen (TYLENOL) 500 MG tablet Take 500 mg by mouth every 6 (six)  hours as needed for headache (pain).    Marland Kitchen aspirin EC 81 MG EC tablet Take 1 tablet (81 mg total) by mouth daily. 30 tablet 0  . atorvastatin (LIPITOR) 40 MG tablet Take 1 tablet (40 mg total) by mouth daily at 6 PM. 30 tablet 6  . carbidopa-levodopa (SINEMET IR) 25-100 MG tablet Take 1 tablet by mouth 3 (three) times daily. 90 tablet 2  . imiquimod (ALDARA) 5 % cream Apply 1 application topically 2 (two) times daily.    Marland Kitchen levETIRAcetam (KEPPRA) 500 MG tablet Take 500 mg by mouth 2 (two) times daily.    Marland Kitchen losartan (COZAAR) 25 MG tablet Take 1 tablet (25 mg total) by mouth daily. 30 tablet 6  . metoprolol succinate (TOPROL-XL) 50 MG 24 hr tablet Take 1  tablet (50 mg total) by mouth daily. Take with or immediately following a meal. 30 tablet 6  . nitroGLYCERIN (NITROSTAT) 0.4 MG SL tablet Place 1 tablet (0.4 mg total) under the tongue every 5 (five) minutes x 3 doses as needed for chest pain. 25 tablet 3  . ondansetron (ZOFRAN ODT) 4 MG disintegrating tablet Take 1 tablet (4 mg total) by mouth every 8 (eight) hours as needed for nausea or vomiting. 20 tablet 0  . ticagrelor (BRILINTA) 90 MG TABS tablet Take 1 tablet (90 mg total) by mouth 2 (two) times daily. 60 tablet 0   No current facility-administered medications on file prior to visit.     BP 126/82 (BP Location: Left Arm, Patient Position: Sitting, Cuff Size: Normal)   Pulse 75   Temp 97.3 F (36.3 C) (Oral)   Wt 143 lb (64.9 kg)   SpO2 97%   BMI 21.12 kg/m    Objective:   Physical Exam  Constitutional: He appears well-nourished. He does not appear ill.  HENT:  Mouth/Throat: Oropharynx is clear and moist.  Neck: Neck supple.  Cardiovascular: Normal rate and regular rhythm.   Pulmonary/Chest: Effort normal and breath sounds normal.  Skin: Skin is warm and dry.  Psychiatric: He has a normal mood and affect.  More interactive and joking today.          Assessment & Plan:

## 2016-03-24 NOTE — Progress Notes (Signed)
Pre visit review using our clinic review tool, if applicable. No additional management support is needed unless otherwise documented below in the visit note. 

## 2016-03-24 NOTE — Patient Instructions (Signed)
We've increased your Zoloft from 25 mg to 50 mg. You may take two of the 25 mg tablets daily until your current bottle is out. I sent a prescription for Zoloft 50 mg to your pharmacy. Take 1 tablet by mouth once daily.  Start albuterol nebulized treatments. Complete 2.5 mg of albuterol twice daily (morning and evening), every day with the nebulized machine.  Take the prescription to the medical supply store to get the nebulizer machine. Make sure you get the tubing and supplies that go with the machine.  Follow up in 2 months for re-evaluation.  It was a pleasure to see you today!

## 2016-03-24 NOTE — Assessment & Plan Note (Signed)
Has not picked up meds due to cost. Discussed to take the Rx for the nebulizer machine to the medical supply store. Will cancel Pulmicort and have him due Albuterol 2.5 mg BID for now. He will update if no improvement. Follow up in 2 months.

## 2016-03-24 NOTE — Assessment & Plan Note (Signed)
Slight improvement per wife on Zoloft. He is more interactive today than he's ever been. Denies SI/HI. Will increase dose to 50 mg. Follow up in 2 months.

## 2016-03-25 ENCOUNTER — Encounter: Payer: Self-pay | Admitting: Cardiology

## 2016-03-31 ENCOUNTER — Other Ambulatory Visit: Payer: Self-pay | Admitting: Pharmacist

## 2016-03-31 NOTE — Patient Outreach (Signed)
[place] [facility]) Care Management  03/31/2016  [name] 11-06-1940 [id number]  Successful phone follow-up to patient's spouse, on [facility] Consent form, whom verified patient's HIPAA details.    Spouse reports patient has been able to obtain a nebulizer as well as albuterol nebulization.  Spouse reports they know how to use the nebulizer and believe medication will be affordable at this time.   Spouse denies other pharmacy related questions at this time.    Plan:   Will place follow-up call in the next 2 weeks, after patient is scheduled to see cardiology, to follow-up on anti-platelet medication as patient presently has samples of Brilinta from cardiology secondary to cost.   Karrie Meres, PharmD, [place] [phone]

## 2016-04-06 ENCOUNTER — Ambulatory Visit (INDEPENDENT_AMBULATORY_CARE_PROVIDER_SITE_OTHER): Payer: PPO | Admitting: Cardiology

## 2016-04-06 ENCOUNTER — Encounter: Payer: Self-pay | Admitting: Cardiology

## 2016-04-06 ENCOUNTER — Encounter (INDEPENDENT_AMBULATORY_CARE_PROVIDER_SITE_OTHER): Payer: Self-pay

## 2016-04-06 VITALS — BP 132/70 | HR 84 | Ht 69.0 in | Wt 144.0 lb

## 2016-04-06 DIAGNOSIS — Z789 Other specified health status: Secondary | ICD-10-CM | POA: Diagnosis not present

## 2016-04-06 DIAGNOSIS — I10 Essential (primary) hypertension: Secondary | ICD-10-CM | POA: Diagnosis not present

## 2016-04-06 DIAGNOSIS — I4729 Other ventricular tachycardia: Secondary | ICD-10-CM

## 2016-04-06 DIAGNOSIS — I214 Non-ST elevation (NSTEMI) myocardial infarction: Secondary | ICD-10-CM

## 2016-04-06 DIAGNOSIS — I2510 Atherosclerotic heart disease of native coronary artery without angina pectoris: Secondary | ICD-10-CM

## 2016-04-06 DIAGNOSIS — E782 Mixed hyperlipidemia: Secondary | ICD-10-CM | POA: Diagnosis not present

## 2016-04-06 DIAGNOSIS — I472 Ventricular tachycardia: Secondary | ICD-10-CM

## 2016-04-06 LAB — CBC WITH DIFFERENTIAL/PLATELET
Basophils Absolute: 0 cells/uL (ref 0–200)
Basophils Relative: 0 %
Eosinophils Absolute: 238 cells/uL (ref 15–500)
Eosinophils Relative: 2 %
HCT: 46.6 % (ref 38.5–50.0)
Hemoglobin: 15.7 g/dL (ref 13.2–17.1)
Lymphocytes Relative: 19 %
Lymphs Abs: 2261 cells/uL (ref 850–3900)
MCH: 29.6 pg (ref 27.0–33.0)
MCHC: 33.7 g/dL (ref 32.0–36.0)
MCV: 87.8 fL (ref 80.0–100.0)
MPV: 10.2 fL (ref 7.5–12.5)
Monocytes Absolute: 1190 cells/uL — ABNORMAL HIGH (ref 200–950)
Monocytes Relative: 10 %
Neutro Abs: 8211 cells/uL — ABNORMAL HIGH (ref 1500–7800)
Neutrophils Relative %: 69 %
Platelets: 247 10*3/uL (ref 140–400)
RBC: 5.31 MIL/uL (ref 4.20–5.80)
RDW: 14.5 % (ref 11.0–15.0)
WBC: 11.9 10*3/uL — ABNORMAL HIGH (ref 3.8–10.8)

## 2016-04-06 LAB — COMPREHENSIVE METABOLIC PANEL
ALT: 26 U/L (ref 9–46)
AST: 18 U/L (ref 10–35)
Albumin: 3.8 g/dL (ref 3.6–5.1)
Alkaline Phosphatase: 126 U/L — ABNORMAL HIGH (ref 40–115)
BUN: 10 mg/dL (ref 7–25)
CO2: 26 mmol/L (ref 20–31)
Calcium: 9.1 mg/dL (ref 8.6–10.3)
Chloride: 103 mmol/L (ref 98–110)
Creat: 0.69 mg/dL — ABNORMAL LOW (ref 0.70–1.18)
Glucose, Bld: 85 mg/dL (ref 65–99)
Potassium: 4.1 mmol/L (ref 3.5–5.3)
Sodium: 141 mmol/L (ref 135–146)
Total Bilirubin: 1 mg/dL (ref 0.2–1.2)
Total Protein: 6.2 g/dL (ref 6.1–8.1)

## 2016-04-06 LAB — TSH: TSH: 1.19 mIU/L (ref 0.40–4.50)

## 2016-04-06 MED ORDER — ROSUVASTATIN CALCIUM 10 MG PO TABS: 10.0000 mg | ORAL_TABLET | Freq: Every day | ORAL | 3 refills | Status: DC

## 2016-04-06 NOTE — Progress Notes (Signed)
04/06/2016 [name]   1941/02/19  [id number]  Primary Physician Sheral Flow, NP Primary Cardiologist: Dr. Meda Coffee   Reason for Visit/CC: [facility] F/u for CAD/ s/p NSTEMI  HPI:  75 y/o male with h/o HTN, HLD , COPD and newly diagnosed CAD, who presents to clinic today for post hospital f/u. He has had 2 admissions for CP within the last 6 weeks. He was first admitted in September with chest pain.  Troponin was elevated to 0.27.  He underwent stress testing that showed no ischemia but prior infarct. He was discharged home with no follow-up. His echocardiogram showed normal LVEF grade 1 diastolic dysfunction. No significant valvular abnormality.  He presented back to the hospital on October 5 with recurrent CP with associated  nausea and shortness of breath, that started the day before and was persistent. Troponin was 0.09 in the ED with normal creatinine and normal hemoglobin. His EKG showed sinus rhythm and new negative ST depressions in the inferolateral leads. He was admitted for NSTEMI and started on a nitro/heparin drip. Also placed on a BB and statin. He underwent LHC with Dr. Haroldine Laws showing 99% stenosed OM-1 treated with DES x1 by Dr. Irish Lack. EF was noted at 50%-55%. He was continued on ASA and Brilinta was added post cath. He was also noted to have runs of SVT/NSVT on telemetry and his metoprolol was increased. Per Discharge summary, it was recommended by Dr. Debara Pickett and Dr. Curt Bears that he be further evaluated with an outpatient event monitor.  04/06/2016  - 2 months follow-up, the patient just recovered from an episode of acute bronchitis and is still using nebulizer. He states that he has chest pain at rest they are throbbing left-sided approximately 3 times a day, that are mild and don't last more than 2-3 minutes. She denies any further palpitations or syncope. No exertional chest pain or shortness of breath. No lower extremity edema. He's complaining of  muscle pain since started on Lipitor.   Current Meds  Medication Sig  . acetaminophen (TYLENOL) 500 MG tablet Take 500 mg by mouth every 6 (six) hours as needed for headache (pain).  Marland Kitchen albuterol (PROVENTIL) (2.5 MG/3ML) 0.083% nebulizer solution Take 3 mLs (2.5 mg total) by nebulization 2 (two) times daily.  Marland Kitchen aspirin EC 81 MG EC tablet Take 1 tablet (81 mg total) by mouth daily.  Marland Kitchen atorvastatin (LIPITOR) 40 MG tablet Take 1 tablet (40 mg total) by mouth daily at 6 PM.  . carbidopa-levodopa (SINEMET IR) 25-100 MG tablet Take 1 tablet by mouth 3 (three) times daily.  . imiquimod (ALDARA) 5 % cream Apply 1 application topically 2 (two) times daily.  Marland Kitchen levETIRAcetam (KEPPRA) 500 MG tablet Take 500 mg by mouth 2 (two) times daily.  Marland Kitchen losartan (COZAAR) 25 MG tablet Take 1 tablet (25 mg total) by mouth daily.  . metoprolol succinate (TOPROL-XL) 50 MG 24 hr tablet Take 1 tablet (50 mg total) by mouth daily. Take with or immediately following a meal.  . nitroGLYCERIN (NITROSTAT) 0.4 MG SL tablet Place 1 tablet (0.4 mg total) under the tongue every 5 (five) minutes x 3 doses as needed for chest pain.  Marland Kitchen ondansetron (ZOFRAN ODT) 4 MG disintegrating tablet Take 1 tablet (4 mg total) by mouth every 8 (eight) hours as needed for nausea or vomiting.  . sertraline (ZOLOFT) 50 MG tablet Take 1 tablet (50 mg total) by mouth daily.  . ticagrelor (BRILINTA) 90 MG TABS tablet Take 1 tablet (90 mg total)  by mouth 2 (two) times daily.   Allergies  Allergen Reactions  . Levaquin [Levofloxacin In D5w] Nausea And Vomiting  . Penicillins Hives    Childhood allergic reaction per spouse - no other information available 01/18/16   Past Medical History:  Diagnosis Date  . Chest pain   . COPD (chronic obstructive pulmonary disease) ([place])   . Depression   . Diabetes ([place])   . Dysphagia   . Essential tremor   . Heart disease   . Hyperlipidemia   . Hypertension   . Seizure disorder ([place])   . Tardive dyskinesia   .  Vitamin D deficiency    Family History  Problem Relation Age of Onset  . Lung cancer Father   . CAD Brother     MI in his 63s    Past Surgical History:  Procedure Laterality Date  . CARDIAC CATHETERIZATION N/A 01/19/2016   Procedure: Left Heart Cath and Coronary Angiography;  Surgeon: Jolaine Artist, MD;  Location: [place] [facility];  Service: Cardiovascular;  Laterality: N/A;  . CARDIAC CATHETERIZATION  01/19/2016   Procedure: Coronary Stent Intervention;  Surgeon: Jettie Booze, MD;  Location: [place] [facility];  Service: Cardiovascular;;  . INGUINAL HERNIA REPAIR Left as child   Social History   Social History  . Marital status: Married    Spouse name: N/A  . Number of children: N/A  . Years of education: N/A   Occupational History  . Not on file.   Social History Main Topics  . Smoking status: Former Smoker    Quit date: 09/04/2004  . Smokeless tobacco: Never Used  . Alcohol use No  . Drug use: No  . Sexual activity: Not on file   Other Topics Concern  . Not on file   Social History Narrative   Married.   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   Enjoys spending time with family.      Review of Systems: General: negative for chills, fever, night sweats or weight changes.  Cardiovascular: negative for chest pain, dyspnea on exertion, edema, orthopnea, palpitations, paroxysmal nocturnal dyspnea or shortness of breath Dermatological: negative for rash Respiratory: negative for cough or wheezing Urologic: negative for hematuria Abdominal: negative for nausea, vomiting, diarrhea, bright red blood per rectum, melena, or hematemesis Neurologic: negative for visual changes, syncope, or dizziness All other systems reviewed and are otherwise negative except as noted above.   Physical Exam:  Blood pressure 132/70, pulse 84, height 5\' 9"  (1.753 m), weight 144 lb (65.3 kg).  General appearance: alert, cooperative and no distress, thin, resting tremor Neck:  no carotid bruit and no JVD Lungs: Expiratory wheezes bilaterally Heart: regular rate and rhythm, S1, S2 normal, no murmur, click, rub or gallop Extremities: no LEE Pulses: 2+ and symmetric Skin: warm and dry Neurologic: Grossly normal  EKG NSR with PVC  ASSESSMENT AND PLAN:   1. CAD: s/p recent NSTEMI secondary to 99% stenosed OM-1 treated with DES x1 by Dr. Irish Lack. EF was noted at 50%-55%. Stable w/o recurrent CP. He's advised to use sublingual nitroglycerin, however his chest pain is very atypical. Continue DAPT with ASA + Brilinta, statin, BB and ARB.  2. NSVT: No further palpitations, 30 day event monitor showed only infrequent PVCs.  Continue metoprolol.   3. HTN: well controlled  4. HLD: on statin therapy with Lipitor, however significant muscle pain, we will changed rosuvastatin 10 mg daily, check CMP CBC TSH today and lipids at the next visit as  he already ate today.  5. Parkinson Disease: newly diagnosed. Followed by Dr. Carles Collet.    PLAN  F/u with Dr. Meda Coffee in 6 weeks.   Ena Dawley , MD 04/06/2016 8:27 AM

## 2016-04-06 NOTE — Patient Instructions (Signed)
Medication Instructions:  STOP Lipitor (Atorvastatin) START Crestor (Rosuvastatin) 10 mg once daily   Labwork: TODAY -  CBC, complete metabolic panel, TSH  Your physician recommends that you return for lab work in: 6 weeks on the day of or a few days before your office visit with Dr. Meda Coffee.  You will need to FAST for this appointment - nothing to eat or drink after midnight the night before except water.   Testing/Procedures: None Ordered   Follow-Up: Your physician recommends that you schedule a follow-up appointment in: 6 weeks with Dr. Meda Coffee   If you need a refill on your cardiac medications before your next appointment, please call your pharmacy.   Thank you for choosing [facility]! Christen Bame, RN [phone]

## 2016-04-07 ENCOUNTER — Other Ambulatory Visit: Payer: Self-pay | Admitting: Neurology

## 2016-04-07 MED ORDER — CARBIDOPA-LEVODOPA 25-100 MG PO TABS: 1.0000 | ORAL_TABLET | Freq: Three times a day (TID) | ORAL | 0 refills | Status: DC

## 2016-04-08 ENCOUNTER — Ambulatory Visit: Payer: Self-pay | Admitting: Pharmacist

## 2016-04-09 ENCOUNTER — Other Ambulatory Visit: Payer: Self-pay | Admitting: Pharmacist

## 2016-04-09 NOTE — Patient Outreach (Signed)
[facility]) Care Management  04/09/2016  Sheila Pavlicek 08-14-1940 [id number]  Unsuccessful phone outreach to patient.  No answer, unable to leave voicemail.    Plan:  Will make another attempt to reach patient next week.   Karrie Meres, PharmD, [place] [phone]

## 2016-04-14 ENCOUNTER — Other Ambulatory Visit: Payer: Self-pay | Admitting: Pharmacist

## 2016-04-14 MED ORDER — CLOPIDOGREL BISULFATE 75 MG PO TABS: 75.0000 mg | ORAL_TABLET | Freq: Every day | ORAL | 11 refills | Status: DC

## 2016-04-14 NOTE — Patient Outreach (Signed)
[facility]) Care Management  04/14/2016  [name] 15-Mar-1941 [id number]  Second outreach call to pt's spouse, [name], successful.  [name] reports patient has nebulizer machine and is using albuterol nebulizers.  Denies any complications with machine.  Spouse states pt received additional samples of Brilinta from cardiology office at last visit.  Reviewed 2018 [place].  Appears Brilinta is tier 3 with $45 copay/30 day supply while in initial coverage phase.  [employer] patient assistance program may require beneficiaries to meet out-of-pocket spending requirement for the calendar year.  Spouse is unsure if Brilinta co-pay will be affordable.   Spouse denies other pharmacy related questions at this time and states she has [name] pharmacist phone number.    Plan: Will close pharmacy case at this time as spouse states pt has nebulizer machine and Brilinta samples.  Will route note to Fuller Canada, PharmD with cardiology office.    Karrie Meres, PharmD, [place] [phone]

## 2016-04-15 ENCOUNTER — Other Ambulatory Visit: Payer: Self-pay | Admitting: *Deleted

## 2016-04-15 ENCOUNTER — Telehealth: Payer: Self-pay | Admitting: Cardiology

## 2016-04-15 ENCOUNTER — Encounter: Payer: Self-pay | Admitting: *Deleted

## 2016-04-15 NOTE — Telephone Encounter (Signed)
New message      Pt c/o medication issue:  1. Name of Medication: plavix and brilinta  2. How are you currently taking this medication (dosage and times per day)?   3. Are you having a reaction (difficulty breathing--STAT)? no 4. What is your medication issue?  Calling to confirm pt is to take both medications. Pt is taking brilinta only now.  Also, please update med list.  Ok to leave message on vm

## 2016-04-15 NOTE — Patient Outreach (Addendum)
Jay [name]) Care Management  [place] Coordination telephone outreach x 5 [name] telephone outreach x 1 post- home visit 04/15/2016  Jay [name] Sep 08, 1940 [id number]  Jay [name] is an 76 y.o. male referred to [place] for transition of care after 2 recent hospitalizationsSeptember 3-5, 2013for chest pain, confusion, cough and near syncope. [name] was re-admitted October 7-11, 2017 for NSTEMI and had stent placement on January 19, 2016. Patienthas a history including but not limited to, HTN, hyperlipedemia, COPD, seizure disorder, cardiac arrhythmias, and Parkinson's disease. [name] is now followed by [facility] for self-health management of chronic disease state of Parkinson's disease, hypertension, and medication management.  Today, [name] reports that he "is doing better, feeling good."  [name] and wife report that [name] has had a problem with his (R) arm bleeding, stating that [name] has "picked a scab" and although wife Jay [name] has been keeping "tight bandages" on the area, "it won't stop bleeding."  (See ROS)  (R) arm was re-dressed with bandages x 2 during today's in-home visit, and prior to leaving home, I instructed [name]'s [name]/ wife Jay [name] in re-dressing area with pressure bandages, also in use of placement of 5 pound bag of dried pinto beans as sandbag over wound.  Bleeding is minimal today but persistent.  Jay [name] verbalize accurate understanding re: when to seek urgent/ emergent care if bleeding continues.  -- Anti-depressant started during PCP office visit 02/23/16 "still helping a lot, especially since his doctor increased his dose in December" per report of [name] and wife.  [name] is not teary and did not cry during today's in-home visit.  [name] appears to be in a good mood/ good spirits today, and he makes jokes during visit, is engaged, and laughs appropriately.   --  Monitoring and recording blood pressure at home2-3 times/ week; wife reports BP ranges "130-140/72-78."  Adhering to heart healthy diet, "as best as we can."  Wife reports that she has not yet contacted food pantry resources provided to her during last [place] in-home visit, stating time constraints; states that she will follow up soon with food pantries and confirmed that she has the telephone numbers.  Confirmed that [name]'s wife spoke with [facility] CSW regarding options for food [name].  [facility] CSW no longer actively involved in [name]'s care.  -- Provider appointments:  Reports that [name] has attended all provider appointments, except for neurology appointment which was scheduled for March 23, 2016; [name] states, "I just can't go back to that doctor, she just talks down to me.  I want a new doctor to manage my Parkinson's and my seizures."  Discussed with [name] and his wife/ [name] Jay [name] that I would facilitate obtaining information about changing [name]'s neurologist, and that [name] should maintain contact with current neurology provider until more information about changing provider is obtained.  ** Addendum 04/16/16:  Reviewed prior EMR notes from neurology provider and noted that [name] has had several cancelled/ no-show appointments with neurologist; noncompliance with neurology appointments/ plan of care documented in neurology provider notes.  [name] noted to be compliant with established plan of care/ appointments with primary care and cardiology providers **  -- reports that [name] has all meds, and is taking all medications as prescribed.  [name]/ wife now report that [name] has purchased a nebulizer and confirms that he has medications for nebulizer use, although he has "not needed to use it much."  Wife continues administering and managing [name]'s  daily oral medications, without use of pill box.  Denies questions or issues around medications today; confirmed that  [place] had contacted [name] and wife regarding issues with medication affordability/ [name].  [facility] pharmacy is no longer actively involved in [name]'s care.  While performing in-home medication reconciliation, two discrepancies were discovered:  1) In EMR, [name] has Plavix 75 mg po QD listed, but this medication is not present in his home.  Wife/ [name] report that Brilinta 90 mg was started in October and continued on 04/06/16 per office visit with cardiologist (verified by review of cardiology provider note from 04/06/16); [name]/ wife states they were given samples to take BID.  [name] also has [place] (in addition to the samples from Dec office visit) from October 2017, label on pharmacy bottle reads "Brilinta 90 mg twice every day."  During my in-home visit today, verified through EMR that Brilinta 90 mg is not listed as active medication.  2) Atorvastatin is present in [name]'s home today, but EMR indicates that [name] should be taking Rosuvastatin 10 mg QD as of 04/06/16.  Verified by review of cardiology provider note that Atorvastatin was discontinued 04/06/16 and Rosuvastatin 10 mg QD was started.  [name]/ wife report uncertainty around this change.   Therefore, the following care coordination calls around [name]'s active medications were placed:  12:20 pm: [place], spoke with pharmacist Gerald Stabs:  Confirms that Kary Kos was active medication in October 2017, but states that Kary Kos is currently not on pharmacy list as active medication; confirms that order from Ena Dawley MD, for Plavix 75 mg po QD, was received yesterday, 04/15/15, and is now ready for pick up.  Confirmed also that Rosuvastatin 10 mg QD is ready for pick up, and that Atorvastatin was discontinued 04/06/16.   12:25 pm:  [facility] Cardiology, spoke with "Butch Penny" operator, requested call-back from nurse/ MD regarding the above medication discrepancies.  1:00  pm:  [facility] After having left [name]'s home, received callback from [facility] [place] for Dr. Meda Coffee, and spoke with both [facility] and Dr. Meda Coffee.  Dr. Meda Coffee confirmed that [name] should be taking Brilinta 90 mg po QD (not BID as he has been taking, on pharmacy label from October 2017).  Confirmed that Atorvastatin had been discontinued and that Rosuvastatin 10 mg QD was active medication for [name].  1:15 pm:  [name] Cardiology received callback from [facility] [place] for Dr. Meda Coffee, who reported that she and Dr. Meda Coffee had discussed medication issue with PA Jay [name], who had been in contact with [facility] [name] pharmacist regarding [name]'s inability to afford Brilinta, and therefore had discontinued Brilinta and started Plavix, and called the order for Plavix 75 mg po QD in to [facility] yesterday 04/15/15. Karlene Einstein confirmed that Dr. Meda Coffee supports this change, and confirmed dosage of plavix 75 mg po QD is now active, and that [name] should no longer be taking Brilinta.    1:20 pm:  [name]/ [name] Sandy:  Updated [name]/ [name] on all of the above medication updates, and instructed that [name] should not take any more Brilinta today, and that [name] should pick up Plavix 75 mg from [facility] and begin administering to [name] one time daily.  [name]/ wife Jay [name] verbalizes accurate understanding of provided instructions.   1:25 pm: [facility]: Rondell Reams, pharmacist and provided update received from [facility] Cardiology provider; Gerald Stabs voiced understanding and again confirmed that both Plavix and Rosuvastatin are ready for pick up.   During visit today, I discussed with [name] and  wife Jay [name] that I had previously been unable to leave a voicemail message for them on their primary phone number; and Jay [name] reported that she did not know how to set up her voicemail on her cell phone, and I assisted her with doing so today.  [name] and wife denied further needs, problems, or concerns during  today's visit, and confirms that they have my direct telephone number, [facility] CM main office number, and [facility] 24-hour nurse advice line phone number.   Subjective: "I'm doing better and feeling good, but my arm won't stop bleeding."  Objective:    BP 124/64   Pulse 70   Resp 18   SpO2 96%    Review of Systems  Constitutional: Positive for weight loss.       [name] states that he believes he has lost weight, "maybe 10 pounds" over the last several months, although he states "he can't be sure," because he does not weigh himself regularly.  Review of EMR (office visit weights) indicated that [name] weight in October/ November 2017 ranged 148-151 lbs; December 2017 weights x 2 were 143 and 144 lbs  Respiratory: Negative for cough, shortness of breath and wheezing.        [name] is not short of breath today; is at rest for duration of home visit  Cardiovascular: Negative.  Negative for chest pain and leg swelling.  Gastrointestinal: Negative for abdominal pain and nausea.  Genitourinary: Negative.   Musculoskeletal: Negative for falls.  Skin: Positive for itching.       [name] has chronic skin issues and has scabs on his ears and his arms from scratching.  [place] area on (R) arm has bandage over it, and is actively bleeding under bandage.  Area was re-dressed x 2 during today's in-home visit; bleeding is minimal but persistent.  Instructed wife in use of pressure dressing and 5-lb bag of dried beans to use as sandbag; see narrative   Neurological: Positive for tremors.       Has Parkinson's disease  Psychiatric/Behavioral: The [name] is not nervous/anxious.     Physical Exam  Constitutional: He is oriented to person, place, and time. He appears well-developed and well-nourished. No distress.    Cardiovascular: Normal rate, regular rhythm, normal heart sounds and intact distal pulses.   Pulses:      Radial pulses are 2+ on the right side, and 2+ on the left side.  Respiratory: Effort  normal. No respiratory distress. He has no wheezes. He has no rales.  Coarse breath sounds throughout bilateral A/L/P lung fields  GI: Soft. Bowel sounds are normal.  Musculoskeletal: He exhibits no edema.  Neurological: He is alert and oriented to person, place, and time.  Skin: Skin is warm and dry.  See ROS  Psychiatric: He has a normal mood and affect. His behavior is normal. Judgment and thought content normal.    Encounter Medications:   Outpatient Encounter Prescriptions as of 04/15/2016  Medication Sig Note  . acetaminophen (TYLENOL) 500 MG tablet Take 500 mg by mouth every 6 (six) hours as needed for headache (pain). 02/05/2016: As needed.   Marland Kitchen albuterol (PROVENTIL) (2.5 MG/3ML) 0.083% nebulizer solution Take 3 mLs (2.5 mg total) by nebulization 2 (two) times daily.   Marland Kitchen aspirin EC 81 MG EC tablet Take 1 tablet (81 mg total) by mouth daily.   . carbidopa-levodopa (SINEMET IR) 25-100 MG tablet Take 1 tablet by mouth 3 (three) times daily.   . clopidogrel (PLAVIX) 75 MG tablet Take 1  tablet (75 mg total) by mouth daily.   . imiquimod (ALDARA) 5 % cream Apply 1 application topically 2 (two) times daily. 02/05/2016: Per spouse, pt start cream.   . levETIRAcetam (KEPPRA) 500 MG tablet Take 500 mg by mouth 2 (two) times daily.   Marland Kitchen losartan (COZAAR) 25 MG tablet Take 1 tablet (25 mg total) by mouth daily.   . metoprolol succinate (TOPROL-XL) 50 MG 24 hr tablet Take 1 tablet (50 mg total) by mouth daily. Take with or immediately following a meal.   . nitroGLYCERIN (NITROSTAT) 0.4 MG SL tablet Place 1 tablet (0.4 mg total) under the tongue every 5 (five) minutes x 3 doses as needed for chest pain. 02/05/2016: Available if needed.   . ondansetron (ZOFRAN ODT) 4 MG disintegrating tablet Take 1 tablet (4 mg total) by mouth every 8 (eight) hours as needed for nausea or vomiting. 02/05/2016: Available  If needed.   . rosuvastatin (CRESTOR) 10 MG tablet Take 1 tablet (10 mg total) by mouth daily.   .  sertraline (ZOLOFT) 50 MG tablet Take 1 tablet (50 mg total) by mouth daily.    No facility-administered encounter medications on file as of 04/15/2016.     Functional Status:   In your present state of health, do you have any difficulty performing the following activities: 02/18/2016 01/17/2016  Hearing? N Y  Vision? N Y  Difficulty concentrating or making decisions? Tempie Donning  Walking or climbing stairs? Y Y  Dressing or bathing? Y Y  Doing errands, shopping? Tempie Donning  Preparing Food and eating ? Y -  Using the Toilet? N -  In the past six months, have you accidently leaked urine? N -  Do you have problems with loss of bowel control? N -  Managing your Medications? Y -  Managing your Finances? Y -  Housekeeping or managing your Housekeeping? Y -  Some recent data might be hidden    Fall/Depression Screening:    PHQ 2/9 Scores 02/23/2016 02/18/2016 02/04/2016  PHQ - 2 Score 6 6 -  PHQ- 9 Score 18 18 -  Exception Documentation - - Other- indicate reason in comment box  Not completed - - call completed with spouse    Assessment:  [place] appears to be feeling well physically today and his post-hospital depressive symptoms are significantly improved.  Jay [name] reports taking all of his prescribed oral medications, and has obtained a nebulizer. Jay [name] are confused by recent changes that have been made in Jay [name]'s daily medications and could benefit from ongoing reinforcement of medication management and self-health management of HTN and cardiac disease.  Jay [name] with changing his neurology provider.  Plan:   Jay [name] take his medications as they are prescribed and will attend all scheduled provider appointments.    I will communicate with Trinton's primary care provider to collaborate on options for changing his primary neurology provider.  Davidand/ or wife will contact his established providers for any concerns, needs, issues, or problems that  arise.  Ammon and his wife will continuerecording his BP readings 2-3 times per week, and limiting salt intake.  [place] outreach to continue with in-home visit next month.  Oneta Rack, RN, BSN, [employer] [facility] Care Management  [phone]

## 2016-04-15 NOTE — Telephone Encounter (Signed)
Informed Laine RN with [facility] that per Dr Meda Coffee, the pt should be taking Plavix 75 mg po daily, as well as ASA 81 MG po daily.  Informed Laine RN that Kary Kos was discontinued yesterday 04/14/16 by Lennette Bihari Pharmacist and our PharmD Jinny Blossom, for the cost of this medication would be too much for the pt.  Informed Richarda Osmond that, that's when the change to d/c Brilinta and start pt on Plavix and ASA took place.  Per Richarda Osmond RN, she verbalized understanding and will endorse this to the pt and caregiver, as well as to the pts Pharmacy in [place].

## 2016-04-16 ENCOUNTER — Other Ambulatory Visit: Payer: Self-pay | Admitting: *Deleted

## 2016-04-16 NOTE — Patient Outreach (Signed)
[facility]) Care Management [place] outreach 04/16/2016  [name] 06-Sep-1940 [id number]  Secure communication through EMR sent to Allie Bossier, NP, PCP re: [name], 76 y.o. male referred to [facility] for transition of care after 2 recent hospitalizationsSeptember 3-5, 2032for chest pain, confusion, cough and near syncope. Patient was re-admitted October 7-11, 2017 for NSTEMI and had stent placement on January 19, 2016. Patienthas a history including but not limited to, HTN, hyperlipedemia, COPD, seizure disorder, cardiac arrhythmias, and Parkinson's disease. Patient is now followed by [place] for self-health management of chronic disease state of Parkinson's disease, hypertension, and medication management.  During [name] routine home visit 04/15/16, patient expressed desire to change his neurology provider, stating that he feels neurologist "talks down to" him.  Patient was advised to maintain communication with current neurology provider until more information can be obtained regarding possibility of changing neurology provider.  Reviewed prior EMR notes from neurology provider and noted that patient has had several cancelled/ no-show appointments with neurologist; noncompliance with neurology appointments/ plan of care documented in neurology provider notes.  Patient noted to be compliant with established plan of care/ appointments with primary care and cardiology providers.  Communication sent to PCP today in effort of collaboration to further explore options around patient request to change his neurology provider.  Plan:  Will await follow up/ input from Allie Bossier, NP, PCP regarding patient request to switch neurology provider.   Oneta Rack, RN, BSN, [employer] [facility] Care Management  [phone]

## 2016-04-19 ENCOUNTER — Other Ambulatory Visit: Payer: Self-pay | Admitting: *Deleted

## 2016-04-19 ENCOUNTER — Encounter: Payer: Self-pay | Admitting: *Deleted

## 2016-04-19 NOTE — Patient Outreach (Addendum)
[facility]) Care Management [place] Telephone Outreach 04/19/2016  Tarell Mcnichol January 21, 1941 [id number]  Successful telephone outreach to Vanessa Barbara, wife/ caregiver on [facility] CM written consent, of Dishawn Radilla,  76 y.o. male referred to [place] for transition of care after 2 recent hospitalizationsSeptember 3-5, 2057for chest pain, confusion, cough and near syncope. Patient was re-admitted October 7-11, 2017 for NSTEMI and had stent placement on January 19, 2016. Patienthas a history including but not limited to, HTN, hyperlipedemia, COPD, seizure disorder, cardiac arrhythmias, and Parkinson's disease. Patient is now followed by [facility] for self-health management of chronic disease state of Parkinson's disease, hypertension, and medication management.  HIPAA/ identity verified by phone today with patient's Katharine Look.  Call was placed today to confirm that patient had retrieved Plavix 75 mg po QD from pharmacy, after this medication/ dose was clarified during routine home visit last week on April 15, 2016.  -- Katharine Look reports patient has new medications, both Plavix 75 mg po QD, and is taking one time every day, and Rosuvastatin 10 mg po QD.  -- Katharine Look reports that patient's (R) arm bleeding was controlled and stopped shortly after my visit last week on April 15, 2016.  Katharine Look reports no further issues with patient bleeding since April 15, 2016.  As we discussed during [place] home visit April 15, 2016, I again shared with Katharine Look that I was working to facilitate patient switching his current neurology provider, as he requested on April 15, 2016.  Katharine Look shared that patient would prefer working with a male provider.  I again encouraged Katharine Look to maintain contact with current neurology provider until more information is obtained about patient's request to switch providers, and she verbalized understanding and agreement of same.  Katharine Look  deniedfurther needs, problems, or concerns with patient, and confirms that they have my direct telephone number, [facility] CM main office number, and [facility] 24-hour nurse advice line phone number.   Plan:   Davidwill take his medications as they are prescribed and will attend all scheduled provider appointments.    I will reach out to neurology provider office manager to collaborate on options for changing [place]'s primary neurology provider, per patient request.  Davidand/ or wife will contact his established providers for any concerns, needs, issues, or problems that arise.  [place] and his wife will continuerecording his BP readings 2-3 times per week, and limiting salt intake.  [place] outreach to continue with scheduled in-home visit next month.  Oneta Rack, RN, BSN, [employer] [facility] Care Management  [phone]

## 2016-04-22 ENCOUNTER — Other Ambulatory Visit: Payer: Self-pay | Admitting: *Deleted

## 2016-04-22 ENCOUNTER — Encounter: Payer: Self-pay | Admitting: *Deleted

## 2016-04-22 NOTE — Patient Outreach (Signed)
[place] [facility]) Care Management [place] Telephone Outreach, Care Coordination 04/22/2016  Jakyri Karsh 1941/01/06 [id number]  Successful incoming telephone outreach from [place] Brigham, Environmental education officer for [facility] [phone]) re:  [name],75 y.o.malereferred to [place] for transition of care after 2 recent hospitalizationsSeptember 3-5, 2050for chest pain, confusion, cough and near syncope. Patient was re-admitted October 7-11, 2017 for NSTEMI and had stent placement on January 19, 2016. Patienthas a history including but not limited to, HTN, hyperlipedemia, COPD, seizure disorder, cardiac arrhythmias, and Parkinson's disease. Patient is now followed by [facility] for self-health management of chronic disease state of Parkinson's disease, hypertension, and medication management.  Call today was placed to address patient's request during [place] routine home visit 04/15/16 to change his neurology provider.  Patient's wife had communicated that patient is interested in having male neurology provider.  [name] shared today that patient's current provider, Dr. Carles Collet is the practice specialist for Parkinson's Disease, and that they have one male neurology provider on staff, [name], who specializes in general neurology/ with a focus on migraine treatment.  [name] shared that she would discuss patient's request with [name] and would follow up with me afterward with additional information.  Plan:  Will await follow up from [place] Brigham, [facility] Neurology practice manager regarding patient request to switch his current neurology provider.  Oneta Rack, RN, BSN, [employer] [facility] Care Management  [phone]

## 2016-04-22 NOTE — Patient Outreach (Signed)
[place] [facility]) Care Management [place] Telephone Outreach, Care Coordination 04/22/2016  [name] Mar 16, 1941 [id number]  Unsuccessful telephone outreach to [facility] [phone]) re:  [name], 76 y.o.malereferred to [place] for transition of care after 2 recent hospitalizationsSeptember 3-5, 2036for chest pain, confusion, cough and near syncope. Patient was re-admitted October 7-11, 2017 for NSTEMI and had stent placement on January 19, 2016. Patienthas a history including but not limited to, HTN, hyperlipedemia, COPD, seizure disorder, cardiac arrhythmias, and Parkinson's disease. Patient is now followed by [facility] for self-health management of chronic disease state of Parkinson's disease, hypertension, and medication management.  During [place] routine home visit 04/15/16, patient expressed desire to change his neurology provider, and call today was placed to address patient's request.  I left voicemail message on practice line requesting call back from Environmental education officer, as practice staff was unavailable to take my call.  Plan:  Will await follow up from [facility] Neu[facility] practice manager regarding patient request to switch his current neurology provider.   Oneta Rack, RN, BSN, [employer] [facility] Care Management  [phone]

## 2016-04-26 ENCOUNTER — Other Ambulatory Visit: Payer: Self-pay | Admitting: *Deleted

## 2016-04-26 NOTE — Patient Outreach (Signed)
[place] [facility]) Care Management [place] Telephone Outreach, Care Coordination 04/26/2016  [name] Nov 03, 1940 [id number]  Successful telephone outreach to [facility] with [facility] Neurology, [phone] re: [name],75 y.o.malereferred to [place] for transition of care after 2 recent hospitalizationsSeptember 3-5, 2091for chest pain, confusion, cough and near syncope. Patient was re-admitted October 7-11, 2017 for NSTEMI and had stent placement on January 19, 2016. Patienthas a history including but not limited to, HTN, hyperlipedemia, COPD, seizure disorder, cardiac arrhythmias, and Parkinson's disease. Patient is now followed by [place] for self-health management of chronic disease state of Parkinson's disease, hypertension, and medication management.  Call today was placed to address patient's request during [place] routine home visit 04/15/16 to change his neurology provider.  Today, Enid Derry stated that all providers at [facility] are actively accepting new patients, and that if patient is interested in seeing a provider, a referral from his PCP would be required.     Plan:  Will await follow up from Lincoln Brigham, [facility] Neurology practice managerregarding patient request to switch his current neurology provider, and will then share options with patient as he requested.  Oneta Rack, RN, BSN, [employer] [facility] Care Management  [phone]

## 2016-05-03 ENCOUNTER — Encounter: Payer: Self-pay | Admitting: *Deleted

## 2016-05-03 ENCOUNTER — Other Ambulatory Visit: Payer: Self-pay | Admitting: *Deleted

## 2016-05-03 NOTE — Patient Outreach (Signed)
[place] [facility]) Care Management [place] Telephone Outreach, Care Coordination  05/03/2016  [name] 03-Jun-1940 [id number]  Successful incoming telephone outreach from [name], Environmental education officer for [facility] [phone]) re: [name],75 y.o.malereferred to [place] for transition of care after 2 recent hospitalizationsSeptember 3-5, 2035for chest pain, confusion, cough and near syncope. Patient was re-admitted October 7-11, 2017 for NSTEMI and had stent placement on January 19, 2016. Patienthas a history including but not limited to, HTN, hyperlipedemia, COPD, seizure disorder, cardiac arrhythmias, and Parkinson's disease. Patient is now followed by [place] for self-health management of chronic disease state of Parkinson's disease, hypertension, and medication management.  Call today was received in follow up to previous calls to address patient's request during [place] routine home visit 04/15/16 to change his neurology provider.  Patient's wife had communicated that patient is interested in having male neurology provider.  [name] shared today that  [name] discussed patient case with his existing provider, Dr. Carles Collet, and is agreeable to take patient as his primary neurology provider.  Plan:  Will communicate information provided today by Faith [facility] Neurology practice manager,to patient for his consideration.  Oneta Rack, RN, BSN, [employer] [facility] Care Management  [phone]

## 2016-05-07 ENCOUNTER — Other Ambulatory Visit: Payer: Self-pay | Admitting: *Deleted

## 2016-05-07 ENCOUNTER — Encounter: Payer: Self-pay | Admitting: *Deleted

## 2016-05-07 NOTE — Patient Outreach (Signed)
[place] [facility]) Care Management [place] Telephone Outreach  05/07/2016  Alma Polino 12-09-1940 [id number]  Unsuccessful telephone outreach to Vanessa Barbara, wife/ caregiver on [facility] CM written consent, of Rhyson Traughber, [place] y.o.malereferred to [place] for transition of care after 2 recent hospitalizationsSeptember 3-5, 2036for chest pain, confusion, cough and near syncope. Patient was re-admitted October 7-11, 2017 for NSTEMI and had stent placement on January 19, 2016. Patienthas a history including but not limited to, HTN, hyperlipedemia, COPD, seizure disorder, cardiac arrhythmias, and Parkinson's disease. Patient is now followed by [place] for self-health management of chronic disease state of Parkinson's disease, hypertension, and medication management.    Call was placed today to confirm with patient and wife/ caregiver that I followed up with [facility] and [facility] regarding patients previous request to change his neurological provider to a male provider; verified through EMR that patient has scheduled office visit as new patient with [name] on May 21, 2016.  HIPAA compliant voice mail message left for patient/ caregiver, requesting return call back if they had questions about neuro provider, and confirming next scheduled [place] CM in-home visit.  Plan:  [place] routine in-home visit scheduled for next month if I do not hear back from patient or his caregiver first.  Oneta Rack, RN, BSN, [place] Coordinator [facility] Care Management  [phone]

## 2016-05-17 ENCOUNTER — Encounter: Payer: Self-pay | Admitting: *Deleted

## 2016-05-20 ENCOUNTER — Other Ambulatory Visit: Payer: PPO

## 2016-05-20 ENCOUNTER — Ambulatory Visit: Payer: PPO | Admitting: Cardiology

## 2016-05-21 ENCOUNTER — Ambulatory Visit: Payer: PPO | Admitting: Neurology

## 2016-05-21 ENCOUNTER — Ambulatory Visit (INDEPENDENT_AMBULATORY_CARE_PROVIDER_SITE_OTHER): Payer: PPO | Admitting: Neurology

## 2016-05-21 ENCOUNTER — Encounter: Payer: Self-pay | Admitting: Neurology

## 2016-05-21 VITALS — BP 140/80 | HR 92 | Ht 69.0 in | Wt 147.5 lb

## 2016-05-21 DIAGNOSIS — G903 Multi-system degeneration of the autonomic nervous system: Secondary | ICD-10-CM

## 2016-05-21 DIAGNOSIS — G239 Degenerative disease of basal ganglia, unspecified: Secondary | ICD-10-CM

## 2016-05-21 DIAGNOSIS — R55 Syncope and collapse: Secondary | ICD-10-CM | POA: Diagnosis not present

## 2016-05-21 DIAGNOSIS — R1319 Other dysphagia: Secondary | ICD-10-CM

## 2016-05-21 DIAGNOSIS — R569 Unspecified convulsions: Secondary | ICD-10-CM | POA: Diagnosis not present

## 2016-05-21 MED ORDER — CARBIDOPA-LEVODOPA 25-100 MG PO TABS: 1.5000 | ORAL_TABLET | Freq: Three times a day (TID) | ORAL | 2 refills | Status: DC

## 2016-05-21 NOTE — Patient Instructions (Signed)
1.  Stop the levetiracetam (Keppra), as I don't think you are having epileptic seizures. 2. Increase the carbidopa-levodopa to 1 and 1/2 tablets three times daily to try and help with the tremor (however, the tremor will likely be difficult to treat). 3.  Follow the recommended diet as you are at high risk of catching pneumonia when you eat. 4.  Follow up in 3 months.

## 2016-05-21 NOTE — Progress Notes (Signed)
NEUROLOGY  OFFICE NOTE  [name] [id number]  HISTORY OF PRESENT ILLNESS: [name] is a 76 year old male with COPD, depression, heart disease, hypertension, hyperlipidemia who follows up for multiple system atrophy and seizures.  He is accompanied by his wife who supplements history.  History also supplemented by notes from Dr. Carles Collet, his previous neurologist.  He started to have bilateral resting tremor in the hands in 2014, which progressively increased.  It then spread to include his head as well.  At first, it was believed to be secondary to neuroleptic medications, such as Seroquel and Abilify.  He also difficulty with ambulation, dysphagia,and pseudobulbar affect .  He also has symptoms consistent with orthostasis, feeling lightheaded when transitioning from lying to standing position.  He was evaluated by Dr. Carles Collet, our movement disorder specialist, and was diagnosed with multiple system atrophy.   He takes carbidopa-levodopa IR 25/100 three times daily, which has not been helpful in treating tremor.  He has dysphagia.  Modified barium swallow from 09/19/14 demonstrated moderate oral phase and cervical phase dysphagia.  He was prescribed a dysphagia 3 mechanical soft diet with thin liquids, however he has been noncompliant.  He also has history of seizures dating back over 20 years ago.  His eyes roll back, stiffens and goes limp.  Previous descriptions mention some shaking as well.  He reports feeling fatigued and foggy in the head with headache afterwards for the rest of the day.  Previous medications included Depakote and Tegretol.  He was evaluated at [facility] in 2008.  His habitual spells were captured on video EEG monitoring with no electrographic correlate, diagnostic for nonepileptic events and was subsequently tapered off of antiepileptic medication.  Routine and 24 hour ambulatory EEGs in July 2016 were negative and did not demonstrate seizure activity.  He was  hospitalized at [facility] in September for chest pain.  When he was transitioning from bed to chair following a Myoview, he developed an episode of body stiffening with shaking and eyes open.  He was unresponsive for about 10 seconds and then returned to baseline.  He was diagnosed with convulsive syncope.  During his last visit in the office, he did not have a drop in blood pressure, but his pulse went from 90 in supine position, then 48 seated and then 98 when standing.  He was started on Keppra due to possibility of seizure.  PAST MEDICAL HISTORY: Past Medical History:  Diagnosis Date  . Chest pain   . COPD (chronic obstructive pulmonary disease) ([place])   . Depression   . Diabetes ([place])   . Dysphagia   . Essential tremor   . Heart disease   . Hyperlipidemia   . Hypertension   . Seizure disorder ([place])   . Tardive dyskinesia   . Vitamin D deficiency     MEDICATIONS: Current Outpatient Prescriptions on File Prior to Visit  Medication Sig Dispense Refill  . acetaminophen (TYLENOL) 500 MG tablet Take 500 mg by mouth every 6 (six) hours as needed for headache (pain).    Marland Kitchen albuterol (PROVENTIL) (2.5 MG/3ML) 0.083% nebulizer solution Take 3 mLs (2.5 mg total) by nebulization 2 (two) times daily. 150 mL 1  . aspirin EC 81 MG EC tablet Take 1 tablet (81 mg total) by mouth daily. 30 tablet 0  . clopidogrel (PLAVIX) 75 MG tablet Take 1 tablet (75 mg total) by mouth daily. 30 tablet 11  . imiquimod (ALDARA) 5 % cream Apply 1 application  topically 2 (two) times daily.    Marland Kitchen losartan (COZAAR) 25 MG tablet Take 1 tablet (25 mg total) by mouth daily. 30 tablet 6  . metoprolol succinate (TOPROL-XL) 50 MG 24 hr tablet Take 1 tablet (50 mg total) by mouth daily. Take with or immediately following a meal. 30 tablet 6  . nitroGLYCERIN (NITROSTAT) 0.4 MG SL tablet Place 1 tablet (0.4 mg total) under the tongue every 5 (five) minutes x 3 doses as needed for chest pain. 25 tablet 3  . ondansetron  (ZOFRAN ODT) 4 MG disintegrating tablet Take 1 tablet (4 mg total) by mouth every 8 (eight) hours as needed for nausea or vomiting. 20 tablet 0  . sertraline (ZOLOFT) 50 MG tablet Take 1 tablet (50 mg total) by mouth daily. 90 tablet 1   No current facility-administered medications on file prior to visit.     ALLERGIES: Allergies  Allergen Reactions  . Levaquin [Levofloxacin In D5w] Nausea And Vomiting  . Penicillins Hives    Childhood allergic reaction per spouse - no other information available 01/18/16    FAMILY HISTORY: Family History  Problem Relation Age of Onset  . Lung cancer Father   . Emphysema Father   . Depression Mother   . CAD Brother   . Heart attack Brother     MI in his 77's    SOCIAL HISTORY: Social History   Social History  . Marital status: Married    Spouse name: N/A  . Number of children: N/A  . Years of education: N/A   Occupational History  . Not on file.   Social History Main Topics  . Smoking status: Former Smoker    Quit date: 09/04/2004  . Smokeless tobacco: Never Used  . Alcohol use No  . Drug use: No  . Sexual activity: Not on file   Other Topics Concern  . Not on file   Social History Narrative   Married.   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   Enjoys spending time with family.     REVIEW OF SYSTEMS: Constitutional: No fevers, chills, or sweats, no generalized fatigue, change in appetite Eyes: No visual changes, double vision, eye pain Ear, nose and throat: No hearing loss, ear pain, nasal congestion, sore throat Cardiovascular: No chest pain, palpitations Respiratory:  No shortness of breath at rest or with exertion, wheezes GastrointestinaI: No nausea, vomiting, diarrhea, abdominal pain, fecal incontinence Genitourinary:  No dysuria, urinary retention or frequency Musculoskeletal:  No neck pain, back pain Integumentary: No rash, pruritus, skin lesions Neurological: as above Psychiatric: No depression, insomnia,  anxiety Endocrine: No palpitations, fatigue, diaphoresis, mood swings, change in appetite, change in weight, increased thirst Hematologic/Lymphatic:  No purpura, petechiae. Allergic/Immunologic: no itchy/runny eyes, nasal congestion, recent allergic reactions, rashes  PHYSICAL EXAM: Vitals:   05/21/16 0938  BP: 140/80  Pulse: 92   General: No acute distress.  Patient appears well-groomed.  thin body habitus. Head:  Normocephalic/atraumatic Eyes:  Fundi examined but not visualized Neck: supple, no paraspinal tenderness, full range of motion Heart:  Regular rate and rhythm Lungs:  Clear to auscultation bilaterally Back: No paraspinal tenderness Neurological Exam: alert and oriented to person and time.  He did not know the building and thought he was in [place]. Attention span and concentration impaired, recent memory poor, remote memory fair, fund of knowledge impaired (when asked who is the Software engineer of the [place], he answered his brother's name).  Speech fluent and not dysarthric, language intact.  Downward gaze  paresis.  Otherwise, CN II-XII intact. Increased tone in upper extremities (left worse than right), bilateral resting tremor in hands (left worse than right), muscle strength 4/5 throughout.  Sensation to light touch intact.  Deep tendon reflexes 2+ throughout, toes downgoing.  Finger to nose with severe tremor.  Wide-based gait with short strides.  Romberg positive.  IMPRESSION & PLAN: 1.  Multiple system atrophy  -will try increasing carbidopa-levodopa IR 25/100 to 1.5 tablets three times daily.  2.  Dysphagia  - Discussed importance in following recommended dysphagia 3 mechanical soft diet with thin liquids due to risk of aspiration pneumonia.  He is agreeable. 3.  Seizure-like activity has been diagnostic for nonepileptic spells, given that his habitual spells have been captured on video EEG monitoring in the past with no electrographic correlate.  It is not certain if  the recent spell was convulsive syncope.  However, I think it is likely.  Also, given that his habitual spells are diagnostic for nonepileptic events, I think we can discontinue the Keppra. 4.  Follow up in 3 months.  43 minutes spent face to face with patient, over 50% spent reviewing history, past tests, and plan.  Metta Clines, DO  CC: Pleas Koch, NP

## 2016-05-25 ENCOUNTER — Encounter: Payer: Self-pay | Admitting: *Deleted

## 2016-05-25 ENCOUNTER — Other Ambulatory Visit: Payer: Self-pay | Admitting: *Deleted

## 2016-05-25 NOTE — Patient Outreach (Signed)
[name]) Care Management  [name] Routine Home Visit/ [facility] Discharge 05/25/2016  [name] [birth year] [id number]  [name] is an 76 y.o. male referred to [place] for transition of care after 2 recent hospitalizationsSeptember 3-5, 2053fr chest pain, confusion, cough and near syncope. Patient was re-admitted October 7-11, 2017 for NSTEMI and had stent placement on January 19, 2016. Patienthas a history including but not limited to, HTN, hyperlipedemia, COPD, seizure disorder, cardiac arrhythmias, and Parkinson's disease. [facility] chronic disease state of Parkinson's disease, CAD, hypertension, and medication management.   Today, DKnoxxreports that he "is doing great," and wife/ Caregiver Jay Lookagrees.  -- Anti-depressant started during PCP office visit 02/23/16"still helping a lot, especially since his doctor increased his dose in December" per report of patient and wife. Patient is not teary and did not cry during today's in-home visit. Patient appears to be in a good mood/ good spirits today, and he makes jokes during visit, is engaged, and laughs appropriately.   -- Monitoring and recording blood pressure at home2-3 times/ week; wife reports BP ranges"130-160/70-80."  Adhering to heart healthy diet, "as best as we can."   -- Provider appointments:  has attended all provider appointments, patient/ wife state recent neurology appointment "went great," and both state that patient is very happy with new neurologist, [name]  Patient/ wife are able to accurately verbalize medication changes that were made during recent neurology appointment.  -- reports that patient has all meds, and is taking all medications as prescribed.  Patient/ wife report that patient has not been using nebulizer, but confirms that he has medications for nebulizer use, as  needed.  Patient and wifedeniedfurther needs, problems, or concerns during today's visit, and we all agreed that [name] [name], as he has successfully met his previously established goals.  I confirmed that they have my direct telephone number, [facility] CM main office number, and [facility] 24-hour nurse advice line phone number should patient needs arise in the future.   Subjective: "I am doing good-- I love my new neurologist, [name]"  Objective:    BP (!) 164/82   Pulse 78   Resp 18   SpO2 97%    Review of Systems  Constitutional: Negative.   Respiratory: Positive for wheezing. Negative for shortness of breath.        Patient/ wife report intermittent wheezing, states patient has "been getting over a cold."  No audible wheezing noted during today's home visit  Cardiovascular: Negative.  Negative for chest pain and leg swelling.  Gastrointestinal: Negative.  Negative for abdominal pain and nausea.  Genitourinary: Negative.   Musculoskeletal: Negative.  Negative for falls.  Skin:       (L) lateral upper arm has quarter-size open sore, appears to be healing, patient/ wife report "skin cancer" [facility]  Neurological: Positive for tremors.       Baseline tremors; patient has Parkinson's disease  Psychiatric/Behavioral: Negative for depression. The patient is not nervous/anxious.     Physical Exam  Constitutional: He is oriented to person, place, and time. He appears well-developed and well-nourished. No distress.  Frail appearing/ baseline  Cardiovascular: Normal rate, regular rhythm, normal heart sounds and intact distal pulses.   Respiratory: Effort normal. No respiratory distress. He has no wheezes. He has no rales.  Diminished breath sounds throughout A/L/P lung  fields; patient baseline  GI: Soft. Bowel sounds are normal.  Musculoskeletal: He exhibits no edema.  Neurological: He is alert and oriented to person, place,  and time.  Skin: Skin is warm and dry.  Psychiatric: He has a normal mood and affect. His behavior is normal. Judgment and thought content normal.   Assessment:  [name] appears to be feeling well physically today and his post-hospital depressive symptoms are significantly improved/ resolved.  [name] reports taking all of his prescribed oral medications, and has obtained a nebulizer. [name] is compliant with medications and provider appointments, and agrees that he is ready for discharge from [place] program.  Plan:   Will discharge patient from [place] program as he has successfully met his previously established goals.  It has been a pleasure working with [name] and his wife Jay Quam Jamse Arn, RN, BSN, [employer] Coordinator [facility] Care Management  [phone]

## 2016-06-17 ENCOUNTER — Encounter: Payer: Self-pay | Admitting: Cardiology

## 2016-06-17 ENCOUNTER — Other Ambulatory Visit: Payer: Self-pay | Admitting: Cardiology

## 2016-06-17 ENCOUNTER — Ambulatory Visit (INDEPENDENT_AMBULATORY_CARE_PROVIDER_SITE_OTHER): Payer: PPO | Admitting: Cardiology

## 2016-06-17 ENCOUNTER — Other Ambulatory Visit: Payer: PPO

## 2016-06-17 VITALS — BP 126/64 | HR 80 | Ht 69.0 in | Wt 148.0 lb

## 2016-06-17 DIAGNOSIS — I739 Peripheral vascular disease, unspecified: Secondary | ICD-10-CM

## 2016-06-17 DIAGNOSIS — E782 Mixed hyperlipidemia: Secondary | ICD-10-CM

## 2016-06-17 DIAGNOSIS — I2510 Atherosclerotic heart disease of native coronary artery without angina pectoris: Secondary | ICD-10-CM

## 2016-06-17 DIAGNOSIS — I10 Essential (primary) hypertension: Secondary | ICD-10-CM

## 2016-06-17 DIAGNOSIS — I4729 Other ventricular tachycardia: Secondary | ICD-10-CM

## 2016-06-17 DIAGNOSIS — I472 Ventricular tachycardia: Secondary | ICD-10-CM | POA: Diagnosis not present

## 2016-06-17 NOTE — Patient Instructions (Signed)
Medication Instructions:   Your physician recommends that you continue on your current medications as directed. Please refer to the Current Medication list given to you today.    Testing/Procedures:  Your physician has requested that you have a lower extremity arterial duplex. This test is an ultrasound of the arteries in the legs or arms. It looks at arterial blood flow in the legs and arms. Allow one hour for Lower and Upper Arterial scans. There are no restrictions or special instructions    Follow-Up:  Your physician wants you to follow-up in: 6 MONTHS WITH DR NELSON You will receive a reminder letter in the mail two months in advance. If you don't receive a letter, please call our office to schedule the follow-up appointment.        If you need a refill on your cardiac medications before your next appointment, please call your pharmacy.   

## 2016-06-17 NOTE — Progress Notes (Signed)
06/17/2016 [name]   05/26/1940  [id number]  Primary Physician Sheral Flow, NP Primary Cardiologist: Dr. Meda Coffee   Reason for Visit/CC: [facility] F/u for CAD/ s/p NSTEMI  HPI:  76 y/o male with h/o HTN, HLD , COPD and CAD admitted to [facility] in 12/2015 with chest pain.  Troponin was elevated to 0.27.  He underwent stress testing that showed no ischemia but prior infarct. He was discharged home with no follow-up. His echocardiogram showed normal LVEF grade 1 diastolic dysfunction. No significant valvular abnormality.  He presented back to the hospital on January 15, 2016 with NSTEMI, LHC with Dr. Haroldine Laws showing 99% stenosed OM-1 treated with DES x1 by Dr. Irish Lack. EF was noted at 50%-55%. He was continued on ASA and Brilinta was added post cath. He was also noted to have runs of SVT/NSVT on telemetry and his metoprolol was increased. Per Discharge summary, it was recommended by Dr. Debara Pickett and Dr. Curt Bears that he be further evaluated with an outpatient event monitor.  06/17/2016 is coming after 3 months, he states that he has been doing well from cardiac standpoint and denies any shortness of breath or chest pain, but he is complaining or claudications are after minimal walking distances. His significantly affects his quality of life. He has been compliant with these medications and denies any side effects, he has no bleeding no muscle cramping. He also denies palpitations or syncope and denies lower extremity edema or orthopnea.  Current Meds  Medication Sig  . acetaminophen (TYLENOL) 500 MG tablet Take 500 mg by mouth every 6 (six) hours as needed for headache (pain).  Marland Kitchen albuterol (PROVENTIL) (2.5 MG/3ML) 0.083% nebulizer solution Take 3 mLs (2.5 mg total) by nebulization 2 (two) times daily.  Marland Kitchen aspirin EC 81 MG EC tablet Take 1 tablet (81 mg total) by mouth daily.  . carbidopa-levodopa (SINEMET IR) 25-100 MG tablet Take 1.5 tablets by mouth 3 (three) times daily.  .  clopidogrel (PLAVIX) 75 MG tablet Take 1 tablet (75 mg total) by mouth daily.  . imiquimod (ALDARA) 5 % cream Apply 1 application topically 2 (two) times daily.  Marland Kitchen levETIRAcetam (KEPPRA) 500 MG tablet Take 500 mg by mouth 2 (two) times daily.  Marland Kitchen losartan (COZAAR) 25 MG tablet Take 1 tablet (25 mg total) by mouth daily.  . metoprolol succinate (TOPROL-XL) 50 MG 24 hr tablet Take 1 tablet (50 mg total) by mouth daily. Take with or immediately following a meal.  . nitroGLYCERIN (NITROSTAT) 0.4 MG SL tablet Place 1 tablet (0.4 mg total) under the tongue every 5 (five) minutes x 3 doses as needed for chest pain.  Marland Kitchen ondansetron (ZOFRAN ODT) 4 MG disintegrating tablet Take 1 tablet (4 mg total) by mouth every 8 (eight) hours as needed for nausea or vomiting.  . rosuvastatin (CRESTOR) 10 MG tablet   . sertraline (ZOLOFT) 50 MG tablet Take 1 tablet (50 mg total) by mouth daily.   Allergies  Allergen Reactions  . Levaquin [Levofloxacin In D5w] Nausea And Vomiting  . Penicillins Hives    Childhood allergic reaction per spouse - no other information available 01/18/16   Past Medical History:  Diagnosis Date  . Chest pain   . COPD (chronic obstructive pulmonary disease) ([place])   . Depression   . Diabetes ([place])   . Dysphagia   . Essential tremor   . Heart disease   . Hyperlipidemia   . Hypertension   . Seizure disorder ([place])   . Tardive dyskinesia   .  Vitamin D deficiency    Family History  Problem Relation Age of Onset  . Lung cancer Father   . Emphysema Father   . Depression Mother   . CAD Brother   . Heart attack Brother     MI in his 20's   Past Surgical History:  Procedure Laterality Date  . CARDIAC CATHETERIZATION N/A 01/19/2016   Procedure: Left Heart Cath and Coronary Angiography;  Surgeon: Jolaine Artist, MD;  Location: [place] [facility];  Service: Cardiovascular;  Laterality: N/A;  . CARDIAC CATHETERIZATION  01/19/2016   Procedure: Coronary Stent Intervention;  Surgeon:  Jettie Booze, MD;  Location: [place] [facility];  Service: Cardiovascular;;  . INGUINAL HERNIA REPAIR Left as child   Social History   Social History  . Marital status: Married    Spouse name: N/A  . Number of children: N/A  . Years of education: N/A   Occupational History  . Not on file.   Social History Main Topics  . Smoking status: Former Smoker    Quit date: 09/04/2004  . Smokeless tobacco: Never Used  . Alcohol use No  . Drug use: No  . Sexual activity: Not on file   Other Topics Concern  . Not on file   Social History Narrative   Married.   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   Enjoys spending time with family.      Review of Systems: General: negative for chills, fever, night sweats or weight changes.  Cardiovascular: negative for chest pain, dyspnea on exertion, edema, orthopnea, palpitations, paroxysmal nocturnal dyspnea or shortness of breath Dermatological: negative for rash Respiratory: negative for cough or wheezing Urologic: negative for hematuria Abdominal: negative for nausea, vomiting, diarrhea, bright red blood per rectum, melena, or hematemesis Neurologic: negative for visual changes, syncope, or dizziness All other systems reviewed and are otherwise negative except as noted above.   Physical Exam:  Blood pressure 126/64, pulse 80, height 5\' 9"  (1.753 m), weight 148 lb (67.1 kg).  General appearance: alert, cooperative and no distress, thin, resting tremor Neck: no carotid bruit and no JVD Lungs: Expiratory wheezes bilaterally Heart: regular rate and rhythm, S1, S2 normal, no murmur, click, rub or gallop Extremities: no LEE Pulses: weak in DP and PT B/L Skin: warm and dry Neurologic: Grossly normal  EKG NSR with PVC    ASSESSMENT AND PLAN:   1. CAD: s/p NSTEMI in 01/2016 (99% stenosed OM-1 treated with DES x1 by Dr. Irish Lack). EF was noted at 50%-55%. Continue DAPT with ASA + Brilinta, statin, BB and ARB. He is  asymptomatic  2. NSVT: No further palpitations, 30 day event monitor showed only infrequent PVCs.  Continue metoprolol.   3. HTN: well controlled on current regimen.  4. HLD: on statin therapy, Lipitor with significant muscle pain, we changed rosuvastatin 10 mg daily, that he toleartes well, LFTs notmal in 03/2015.  5. Claudications - with weak peripheral pulses - we will schedule B/L LE arterial Duplex  6. Parkinson Disease: newly diagnosed. Followed by Dr. Carles Collet.    PLAN  F/u with Dr. Meda Coffee in 3 months Ena Dawley , MD 06/17/2016 10:04 AM

## 2016-06-18 ENCOUNTER — Ambulatory Visit (HOSPITAL_COMMUNITY)
Admission: RE | Admit: 2016-06-18 | Discharge: 2016-06-18 | Disposition: A | Discharge status: Home / Self Care | Payer: PPO | Source: Ambulatory Visit | Attending: Cardiology | Admitting: Cardiology

## 2016-06-18 DIAGNOSIS — I10 Essential (primary) hypertension: Secondary | ICD-10-CM | POA: Diagnosis not present

## 2016-06-18 DIAGNOSIS — J449 Chronic obstructive pulmonary disease, unspecified: Secondary | ICD-10-CM | POA: Diagnosis not present

## 2016-06-18 DIAGNOSIS — E785 Hyperlipidemia, unspecified: Secondary | ICD-10-CM | POA: Insufficient documentation

## 2016-06-18 DIAGNOSIS — I739 Peripheral vascular disease, unspecified: Secondary | ICD-10-CM | POA: Diagnosis not present

## 2016-06-18 DIAGNOSIS — I70203 Unspecified atherosclerosis of native arteries of extremities, bilateral legs: Secondary | ICD-10-CM | POA: Insufficient documentation

## 2016-06-18 DIAGNOSIS — I2510 Atherosclerotic heart disease of native coronary artery without angina pectoris: Secondary | ICD-10-CM | POA: Diagnosis not present

## 2016-06-19 ENCOUNTER — Observation Stay (HOSPITAL_COMMUNITY)
Admission: EM | Admit: 2016-06-19 | Discharge: 2016-06-20 | Disposition: A | Discharge status: Home / Self Care | Payer: PPO | Attending: Internal Medicine | Admitting: Internal Medicine

## 2016-06-19 ENCOUNTER — Emergency Department (HOSPITAL_COMMUNITY): Payer: PPO

## 2016-06-19 ENCOUNTER — Encounter (HOSPITAL_COMMUNITY): Payer: Self-pay | Admitting: Emergency Medicine

## 2016-06-19 DIAGNOSIS — J449 Chronic obstructive pulmonary disease, unspecified: Secondary | ICD-10-CM | POA: Diagnosis present

## 2016-06-19 DIAGNOSIS — F329 Major depressive disorder, single episode, unspecified: Secondary | ICD-10-CM | POA: Diagnosis not present

## 2016-06-19 DIAGNOSIS — G232 Striatonigral degeneration: Secondary | ICD-10-CM | POA: Diagnosis not present

## 2016-06-19 DIAGNOSIS — Z880 Allergy status to penicillin: Secondary | ICD-10-CM | POA: Diagnosis not present

## 2016-06-19 DIAGNOSIS — R072 Precordial pain: Secondary | ICD-10-CM | POA: Diagnosis not present

## 2016-06-19 DIAGNOSIS — R29810 Facial weakness: Secondary | ICD-10-CM | POA: Diagnosis not present

## 2016-06-19 DIAGNOSIS — Z8249 Family history of ischemic heart disease and other diseases of the circulatory system: Secondary | ICD-10-CM | POA: Insufficient documentation

## 2016-06-19 DIAGNOSIS — E876 Hypokalemia: Secondary | ICD-10-CM | POA: Diagnosis not present

## 2016-06-19 DIAGNOSIS — I493 Ventricular premature depolarization: Secondary | ICD-10-CM | POA: Diagnosis not present

## 2016-06-19 DIAGNOSIS — G20 Parkinson's disease: Secondary | ICD-10-CM | POA: Insufficient documentation

## 2016-06-19 DIAGNOSIS — I200 Unstable angina: Secondary | ICD-10-CM | POA: Diagnosis not present

## 2016-06-19 DIAGNOSIS — I252 Old myocardial infarction: Secondary | ICD-10-CM | POA: Diagnosis not present

## 2016-06-19 DIAGNOSIS — I2510 Atherosclerotic heart disease of native coronary artery without angina pectoris: Secondary | ICD-10-CM

## 2016-06-19 DIAGNOSIS — I25110 Atherosclerotic heart disease of native coronary artery with unstable angina pectoris: Secondary | ICD-10-CM | POA: Diagnosis not present

## 2016-06-19 DIAGNOSIS — E1151 Type 2 diabetes mellitus with diabetic peripheral angiopathy without gangrene: Secondary | ICD-10-CM | POA: Diagnosis not present

## 2016-06-19 DIAGNOSIS — I639 Cerebral infarction, unspecified: Secondary | ICD-10-CM

## 2016-06-19 DIAGNOSIS — E559 Vitamin D deficiency, unspecified: Secondary | ICD-10-CM | POA: Diagnosis not present

## 2016-06-19 DIAGNOSIS — G40909 Epilepsy, unspecified, not intractable, without status epilepticus: Secondary | ICD-10-CM

## 2016-06-19 DIAGNOSIS — Z7982 Long term (current) use of aspirin: Secondary | ICD-10-CM | POA: Insufficient documentation

## 2016-06-19 DIAGNOSIS — K219 Gastro-esophageal reflux disease without esophagitis: Secondary | ICD-10-CM | POA: Diagnosis not present

## 2016-06-19 DIAGNOSIS — E785 Hyperlipidemia, unspecified: Secondary | ICD-10-CM | POA: Diagnosis present

## 2016-06-19 DIAGNOSIS — Z87891 Personal history of nicotine dependence: Secondary | ICD-10-CM | POA: Diagnosis not present

## 2016-06-19 DIAGNOSIS — G2401 Drug induced subacute dyskinesia: Secondary | ICD-10-CM | POA: Insufficient documentation

## 2016-06-19 DIAGNOSIS — Z7902 Long term (current) use of antithrombotics/antiplatelets: Secondary | ICD-10-CM | POA: Diagnosis not present

## 2016-06-19 DIAGNOSIS — R0789 Other chest pain: Principal | ICD-10-CM | POA: Insufficient documentation

## 2016-06-19 DIAGNOSIS — I471 Supraventricular tachycardia: Secondary | ICD-10-CM | POA: Diagnosis not present

## 2016-06-19 DIAGNOSIS — R092 Respiratory arrest: Secondary | ICD-10-CM | POA: Diagnosis not present

## 2016-06-19 DIAGNOSIS — I10 Essential (primary) hypertension: Secondary | ICD-10-CM | POA: Diagnosis not present

## 2016-06-19 DIAGNOSIS — R55 Syncope and collapse: Secondary | ICD-10-CM

## 2016-06-19 DIAGNOSIS — Z955 Presence of coronary angioplasty implant and graft: Secondary | ICD-10-CM | POA: Diagnosis not present

## 2016-06-19 DIAGNOSIS — R079 Chest pain, unspecified: Secondary | ICD-10-CM | POA: Diagnosis present

## 2016-06-19 LAB — COMPREHENSIVE METABOLIC PANEL
ALBUMIN: 3.8 g/dL (ref 3.5–5.0)
ALK PHOS: 93 U/L (ref 38–126)
ALT: 12 U/L — ABNORMAL LOW (ref 17–63)
ANION GAP: 10 (ref 5–15)
AST: 19 U/L (ref 15–41)
BUN: 10 mg/dL (ref 6–20)
CALCIUM: 9 mg/dL (ref 8.9–10.3)
CHLORIDE: 103 mmol/L (ref 101–111)
CO2: 27 mmol/L (ref 22–32)
Creatinine, Ser: 0.73 mg/dL (ref 0.61–1.24)
GFR calc Af Amer: >60 mL/min (ref >60)
GFR calc non Af Amer: >60 mL/min (ref >60)
GLUCOSE: 125 mg/dL — AB (ref 65–99)
Potassium: 3.4 mmol/L — ABNORMAL LOW (ref 3.5–5.1)
SODIUM: 140 mmol/L (ref 135–145)
Total Bilirubin: 1.1 mg/dL (ref 0.3–1.2)
Total Protein: 6.9 g/dL (ref 6.5–8.1)

## 2016-06-19 LAB — PROTIME-INR
INR: 0.97
PROTHROMBIN TIME: 12.9 s (ref 11.4–15.2)

## 2016-06-19 LAB — URINALYSIS, ROUTINE W REFLEX MICROSCOPIC
BILIRUBIN URINE: NEGATIVE
Glucose, UA: NEGATIVE mg/dL
Hgb urine dipstick: NEGATIVE
Ketones, ur: NEGATIVE mg/dL
LEUKOCYTES UA: NEGATIVE
NITRITE: NEGATIVE
PH: 7 (ref 5.0–8.0)
Protein, ur: NEGATIVE mg/dL
SPECIFIC GRAVITY, URINE: 1.014 (ref 1.005–1.030)

## 2016-06-19 LAB — CBC
HCT: 47.3 % (ref 39.0–52.0)
HEMOGLOBIN: 15.8 g/dL (ref 13.0–17.0)
MCH: 28.3 pg (ref 26.0–34.0)
MCHC: 33.4 g/dL (ref 30.0–36.0)
MCV: 84.6 fL (ref 78.0–100.0)
PLATELETS: 219 10*3/uL (ref 150–400)
RBC: 5.59 MIL/uL (ref 4.22–5.81)
RDW: 13.4 % (ref 11.5–15.5)
WBC: 8.7 10*3/uL (ref 4.0–10.5)

## 2016-06-19 LAB — TROPONIN I
Troponin I: <0.03 ng/mL (ref <0.03)
Troponin I: <0.03 ng/mL (ref <0.03)

## 2016-06-19 LAB — MAGNESIUM: MAGNESIUM: 2 mg/dL (ref 1.7–2.4)

## 2016-06-19 MED ORDER — LOSARTAN POTASSIUM 25 MG PO TABS
25.0000 mg | ORAL_TABLET | Freq: Every day | ORAL | Status: DC
  Administered 2016-06-19 – 2016-06-20 (×2): 25 mg via ORAL
  Filled 2016-06-19 (×2): qty 1

## 2016-06-19 MED ORDER — ONDANSETRON HCL 4 MG/2ML IJ SOLN: 4.0000 mg | Freq: Four times a day (QID) | INTRAMUSCULAR | Status: DC | PRN

## 2016-06-19 MED ORDER — NITROGLYCERIN 0.4 MG SL SUBL
0.4000 mg | SUBLINGUAL_TABLET | SUBLINGUAL | Status: DC | PRN
  Administered 2016-06-19 (×2): 0.4 mg via SUBLINGUAL
  Filled 2016-06-19 (×2): qty 1

## 2016-06-19 MED ORDER — CARBIDOPA-LEVODOPA 25-100 MG PO TABS
1.5000 | ORAL_TABLET | Freq: Three times a day (TID) | ORAL | Status: DC
  Administered 2016-06-19 – 2016-06-20 (×3): 1.5 via ORAL
  Filled 2016-06-19: qty 2
  Filled 2016-06-19: qty 1
  Filled 2016-06-19: qty 2

## 2016-06-19 MED ORDER — ROSUVASTATIN CALCIUM 20 MG PO TABS
20.0000 mg | ORAL_TABLET | Freq: Every day | ORAL | Status: DC
  Administered 2016-06-19: 20 mg via ORAL
  Filled 2016-06-19: qty 2
  Filled 2016-06-19 (×2): qty 1

## 2016-06-19 MED ORDER — HEPARIN SODIUM (PORCINE) 5000 UNIT/ML IJ SOLN
5000.0000 [IU] | Freq: Three times a day (TID) | INTRAMUSCULAR | Status: DC
  Administered 2016-06-19 – 2016-06-20 (×3): 5000 [IU] via SUBCUTANEOUS
  Filled 2016-06-19 (×2): qty 1

## 2016-06-19 MED ORDER — ACETAMINOPHEN 325 MG PO TABS
650.0000 mg | ORAL_TABLET | ORAL | Status: DC | PRN
  Administered 2016-06-20: 650 mg via ORAL
  Filled 2016-06-19: qty 2

## 2016-06-19 MED ORDER — METOPROLOL SUCCINATE ER 50 MG PO TB24
50.0000 mg | ORAL_TABLET | Freq: Every day | ORAL | Status: DC
  Administered 2016-06-19 – 2016-06-20 (×2): 50 mg via ORAL
  Filled 2016-06-19 (×2): qty 1

## 2016-06-19 MED ORDER — POTASSIUM CHLORIDE CRYS ER 20 MEQ PO TBCR
20.0000 meq | EXTENDED_RELEASE_TABLET | Freq: Once | ORAL | Status: AC
  Administered 2016-06-19: 20 meq via ORAL
  Filled 2016-06-19: qty 1

## 2016-06-19 MED ORDER — CLOPIDOGREL BISULFATE 75 MG PO TABS
75.0000 mg | ORAL_TABLET | Freq: Every day | ORAL | Status: DC
  Administered 2016-06-19 – 2016-06-20 (×2): 75 mg via ORAL
  Filled 2016-06-19 (×2): qty 1

## 2016-06-19 MED ORDER — ASPIRIN EC 81 MG PO TBEC
81.0000 mg | DELAYED_RELEASE_TABLET | Freq: Every day | ORAL | Status: DC
  Administered 2016-06-20: 81 mg via ORAL
  Filled 2016-06-19: qty 1

## 2016-06-19 MED ORDER — PANTOPRAZOLE SODIUM 40 MG PO TBEC
40.0000 mg | DELAYED_RELEASE_TABLET | Freq: Every day | ORAL | Status: DC
  Administered 2016-06-19 – 2016-06-20 (×2): 40 mg via ORAL
  Filled 2016-06-19 (×2): qty 1

## 2016-06-19 MED ORDER — LEVETIRACETAM 500 MG PO TABS
500.0000 mg | ORAL_TABLET | Freq: Two times a day (BID) | ORAL | Status: DC
  Administered 2016-06-19 – 2016-06-20 (×3): 500 mg via ORAL
  Filled 2016-06-19 (×3): qty 1

## 2016-06-19 MED ORDER — SERTRALINE HCL 50 MG PO TABS
50.0000 mg | ORAL_TABLET | Freq: Every day | ORAL | Status: DC
  Administered 2016-06-19 – 2016-06-20 (×2): 50 mg via ORAL
  Filled 2016-06-19 (×2): qty 1

## 2016-06-19 MED ORDER — ALBUTEROL SULFATE (2.5 MG/3ML) 0.083% IN NEBU: 2.5000 mg | INHALATION_SOLUTION | Freq: Two times a day (BID) | RESPIRATORY_TRACT | Status: DC

## 2016-06-19 MED ORDER — GI COCKTAIL ~~LOC~~: 30.0000 mL | Freq: Four times a day (QID) | ORAL | Status: DC | PRN

## 2016-06-19 MED ORDER — ALBUTEROL SULFATE (2.5 MG/3ML) 0.083% IN NEBU
2.5000 mg | INHALATION_SOLUTION | Freq: Two times a day (BID) | RESPIRATORY_TRACT | Status: DC
  Administered 2016-06-20: 2.5 mg via RESPIRATORY_TRACT
  Filled 2016-06-19: qty 3

## 2016-06-19 MED ORDER — ASPIRIN 81 MG PO CHEW
324.0000 mg | CHEWABLE_TABLET | Freq: Once | ORAL | Status: AC
  Administered 2016-06-19: 324 mg via ORAL
  Filled 2016-06-19: qty 4

## 2016-06-19 NOTE — Consult Note (Signed)
ELECTROPHYSIOLOGY CONSULT NOTE  Patient ID: [name], MRN: [id number], DOB/AGE: [birth year] 76 y.o. Admit date: 06/19/2016 Date of Consult: 06/19/2016  Primary Physician: Sheral Flow, NP Primary Cardiologist: [facility]  Chief Complaint: CHEST PAIN   HPI [name] is a 76 y.o. male  Admitted with chest pain.  This awakened him yesterday and lasted most of the day. Gradually abated last night to recur this morning.  This pain reminded him of his pre-admission pains in the fall. It was not aggravated or relieved by anything OF which  he was aware until he received nitroglycerin this morning  Initial troponin negative  He has a history of coronary artery disease. He had a non-STEMI 10/17. Catheterization demonstrated 99% OM1 lesion for which she was treated with a DES. EF was 50%.  He was seen in the office 06/17/16 by Dr. Lesly Dukes at that time without complaints  He has known peripheral vascular disease and underwent imaging yesterday. He has significant COPD. He has some degree of dementia ? And parkinsons   Past Medical History:  Diagnosis Date  . Chest pain   . COPD (chronic obstructive pulmonary disease) ([place])   . Depression   . Diabetes ([place])   . Dysphagia   . Essential tremor   . Heart disease   . Hyperlipidemia   . Hypertension   . Seizure disorder ([place])   . Tardive dyskinesia   . Vitamin D deficiency       Surgical History:  Past Surgical History:  Procedure Laterality Date  . CARDIAC CATHETERIZATION N/A 01/19/2016   Procedure: Left Heart Cath and Coronary Angiography;  Surgeon: Jolaine Artist, MD;  Location: [place] [facility];  Service: Cardiovascular;  Laterality: N/A;  . CARDIAC CATHETERIZATION  01/19/2016   Procedure: Coronary Stent Intervention;  Surgeon: Jettie Booze, MD;  Location: [place] [facility];  Service: Cardiovascular;;  . INGUINAL HERNIA REPAIR Left as child     Home Meds: Prior  to Admission medications   Medication Sig Start Date End Date Taking? Authorizing Provider  acetaminophen (TYLENOL) 500 MG tablet Take 500 mg by mouth every 6 (six) hours as needed for headache (pain).   Yes Historical Provider, MD  albuterol (PROVENTIL) (2.5 MG/3ML) 0.083% nebulizer solution Take 3 mLs (2.5 mg total) by nebulization 2 (two) times daily. 03/24/16  Yes Pleas Koch, NP  aspirin EC 81 MG EC tablet Take 1 tablet (81 mg total) by mouth daily. 12/16/15  Yes Belkys A Regalado, MD  carbidopa-levodopa (SINEMET IR) 25-100 MG tablet Take 1.5 tablets by mouth 3 (three) times daily. 05/21/16  Yes Pieter Partridge, DO  clopidogrel (PLAVIX) 75 MG tablet Take 1 tablet (75 mg total) by mouth daily. 04/14/16  Yes Dorothy Spark, MD  imiquimod [place]) 5 % cream Apply 1 application topically 2 (two) times daily. 01/12/16  Yes Historical Provider, MD  losartan (COZAAR) 25 MG tablet Take 1 tablet (25 mg total) by mouth daily. 01/21/16  Yes Cheryln Manly, NP  metoprolol succinate (TOPROL-XL) 50 MG 24 hr tablet Take 1 tablet (50 mg total) by mouth daily. Take with or immediately following a meal. 01/21/16  Yes Cheryln Manly, NP  ondansetron (ZOFRAN ODT) 4 MG disintegrating tablet Take 1 tablet (4 mg total) by mouth every 8 (eight) hours as needed for nausea or vomiting. 02/04/16  Yes Pleas Koch, NP  rosuvastatin (CRESTOR) 10 MG tablet  04/07/16  Yes Historical Provider,  MD  sertraline (ZOLOFT) 50 MG tablet Take 1 tablet (50 mg total) by mouth daily. 03/24/16  Yes Pleas Koch, NP  nitroGLYCERIN (NITROSTAT) 0.4 MG SL tablet Place 1 tablet (0.4 mg total) under the tongue every 5 (five) minutes x 3 doses as needed for chest pain. 01/21/16   Cheryln Manly, NP    Allergies:  Allergies  Allergen Reactions  . Levaquin [Levofloxacin In D5w] Nausea And Vomiting  . Penicillins Hives    Childhood allergic reaction per spouse - no other information available 01/18/16    Social History    Social History  . Marital status: Married    Spouse name: N/A  . Number of children: N/A  . Years of education: N/A   Occupational History  . Not on file.   Social History Main Topics  . Smoking status: Former Smoker    Quit date: 09/04/2004  . Smokeless tobacco: Never Used  . Alcohol use No  . Drug use: No  . Sexual activity: Not on file   Other Topics Concern  . Not on file   Social History Narrative   Married.   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   Enjoys spending time with family.      Family History  Problem Relation Age of Onset  . Lung cancer Father   . Emphysema Father   . Depression Mother   . CAD Brother   . Heart attack Brother     MI in his 20's     ROS:  Please see the history of present illness.     All other systems reviewed and negative.    Physical Exam: Blood pressure (!) 149/56, pulse 63, temperature 98 F (36.7 C), temperature source Oral, resp. rate 16, height 5\' 9"  (1.753 m), weight 143 lb 1.6 oz (64.9 kg), SpO2 99 %. General: Well developed, well nourished male in no acute distress. Head: Normocephalic, atraumatic, sclera non-icteric, no xanthomas, nares are without discharge. EENT: normal  Lymph Nodes:  none Neck: Negative for carotid bruits. JVD not elevated. Back:without scoliosis kyphosis Lungs: Clear bilaterally But with decreased breath sounds  Breathing is  mildly labored. Heart: RRR with S1 S2 heart sounds are distant. 2/6 systolic  murmur . No rubs, or gallops appreciated. Abdomen: Soft, non-tender, non-distended with normoactive bowel sounds. No hepatomegaly. No rebound/guarding. No obvious abdominal masses. Msk:  Strength and tone appear normal for age. Extremities: No clubbing or cyanosis. No  edema.  Distal pedal pulses are 2+ and equal bilaterally. Skin: Warm and Dry Neuro: Alert and oriented X 3. CN III-XII intact Grossly normal sensory and motor function . Psych:  Responds to questions appropriately with a normal  affect.      Labs: Cardiac Enzymes  Recent Labs  06/19/16 1103  TROPONINI <0.03   CBC Lab Results  Component Value Date   WBC 8.7 06/19/2016   HGB 15.8 06/19/2016   HCT 47.3 06/19/2016   MCV 84.6 06/19/2016   PLT 219 06/19/2016   PROTIME:  Recent Labs  06/19/16 1103  LABPROT 12.9  INR 0.97   Chemistry  Recent Labs Lab 06/19/16 1103  NA 140  K 3.4*  CL 103  CO2 27  BUN 10  CREATININE 0.73  CALCIUM 9.0  PROT 6.9  BILITOT 1.1  ALKPHOS 93  ALT 12*  AST 19  GLUCOSE 125*   Lipids Lab Results  Component Value Date   CHOL 197 12/14/2015   HDL 32 (L) 12/14/2015   LDLCALC 130 (  H) 12/14/2015   TRIG 174 (H) 12/14/2015   BNP No results found for: PROBNP Thyroid Function Tests: No results for input(s): TSH, T4TOTAL, T3FREE, THYROIDAB in the last 72 hours.  Invalid input(s): FREET3 Miscellaneous Lab Results  Component Value Date   DDIMER 0.34 12/14/2015    Radiology/Studies:  Dg Chest 2 View  Result Date: 06/19/2016 CLINICAL DATA:  Left-sided chest pain for 2 days EXAM: CHEST  2 VIEW COMPARISON:  02/04/2016 FINDINGS: The heart size and mediastinal contours are within normal limits. Both lungs are clear. The visualized skeletal structures are unremarkable. IMPRESSION: No active cardiopulmonary disease. Electronically Signed   By: Kathreen Devoid   On: 06/19/2016 11:18    EKG: Personally reviewed 10:43 AM Sinus rhythm at 89 16/08/36 ST segment depression inferiorly 2-3 mm with some concomitant PR segment depression  ECG from 10/18 was personally reviewed. Unfortunately it also had ST segment depression inferiorly and there is no interval echocardiogram Assessment and Plan: Chest Pain  CAD  Prior stenting  COPD   Peripheral Vasc Disease   Hyperlipidemia    the patient's chest pain syndrome is quite atypical. The challenging to his evaluation however relates to his negative Myoview scan 9/17 followed by a catheterization demonstrating high-grade  diagonal lesion.   cycle his troponins, but I think we are going to be stuck with catheterization given the false negative Myoview scan previously.  I have reviewed this with him and his family.  We will check lipid profile     Virl Axe

## 2016-06-19 NOTE — ED Triage Notes (Signed)
Pt. Stated, I started having chest pain since yesterday morning. No other symptoms

## 2016-06-19 NOTE — ED Provider Notes (Signed)
[place] DEPT Provider Note   CSN: [id number] Arrival date & time: 06/19/16  1030     History   Chief Complaint Chief Complaint  Patient presents with  . Chest Pain    HPI [name] is a 76 y.o. male.  Pt presents to the ED today with CP.  Pt has a hx of CAD and had a NSTEMI in October 2017.  He had a 99% stenosed OM-1 treated with DES times 1 by Dr. Irish Lack.  He did have some runs of SVT/NSVT on telemetry during that stay and metoprolol was increased.  Pt did have an event monitor after d/c that just showed occ PVC.  He saw his cardiologist (Dr. Meda Coffee) on 3/8 for a recheck.  No cp then.  He has been compliant with his meds.  She ordered a vascular US LE art study which was done yesterday.  Per wife, it shows blockages.  I don't see an official report in EPIC.  The pt said he had cp yesterday which went away last night.  It came back today.  He has not taken any of his meds yet.        Past Medical History:  Diagnosis Date  . Chest pain   . COPD (chronic obstructive pulmonary disease) ([place])   . Depression   . Diabetes ([place])   . Dysphagia   . Essential tremor   . Heart disease   . Hyperlipidemia   . Hypertension   . Seizure disorder ([place])   . Tardive dyskinesia   . Vitamin D deficiency     Patient Active Problem List   Diagnosis Date Noted  . CAD (coronary artery disease) 06/19/2016  . Claudication ([place]) 06/17/2016  . Status post coronary artery stent placement   . SVT (supraventricular tachycardia) ([place])   . NSVT (nonsustained ventricular tachycardia) ([place])   . NSTEMI (non-ST elevated myocardial infarction) ([place])   . Parkinson's variant of multiple system atrophy ([place]) 12/19/2015  . Encephalopathy chronic 12/14/2015  . Chest pain   . COPD (chronic obstructive pulmonary disease) ([place])   . Heart disease   . Seizure disorder ([place])   . Depression   . Tardive dyskinesia   . Dysphagia   . Skin lesion 08/14/2015  . Shortness of breath  08/14/2015  . Essential hypertension 08/14/2015  . Hyperlipidemia 08/14/2015  . Essential tremor 08/14/2015    Past Surgical History:  Procedure Laterality Date  . CARDIAC CATHETERIZATION N/A 01/19/2016   Procedure: Left Heart Cath and Coronary Angiography;  Surgeon: Jolaine Artist, MD;  Location: [place] [facility];  Service: Cardiovascular;  Laterality: N/A;  . CARDIAC CATHETERIZATION  01/19/2016   Procedure: Coronary Stent Intervention;  Surgeon: Jettie Booze, MD;  Location: [place] [facility];  Service: Cardiovascular;;  . INGUINAL HERNIA REPAIR Left as child       Home Medications    Prior to Admission medications   Medication Sig Start Date End Date Taking? Authorizing Provider  acetaminophen (TYLENOL) 500 MG tablet Take 500 mg by mouth every 6 (six) hours as needed for headache (pain).    Historical Provider, MD  albuterol (PROVENTIL) (2.5 MG/3ML) 0.083% nebulizer solution Take 3 mLs (2.5 mg total) by nebulization 2 (two) times daily. 03/24/16   Pleas Koch, NP  aspirin EC 81 MG EC tablet Take 1 tablet (81 mg total) by mouth daily. 12/16/15   Belkys A Regalado, MD  carbidopa-levodopa (SINEMET IR) 25-100 MG tablet Take 1.5 tablets by mouth 3 (three)  times daily. 05/21/16   Pieter Partridge, DO  clopidogrel (PLAVIX) 75 MG tablet Take 1 tablet (75 mg total) by mouth daily. 04/14/16   Dorothy Spark, MD  imiquimod (ALDARA) 5 % cream Apply 1 application topically 2 (two) times daily. 01/12/16   Historical Provider, MD  levETIRAcetam (KEPPRA) 500 MG tablet Take 500 mg by mouth 2 (two) times daily. 04/22/16   Historical Provider, MD  losartan (COZAAR) 25 MG tablet Take 1 tablet (25 mg total) by mouth daily. 01/21/16   Cheryln Manly, NP  metoprolol succinate (TOPROL-XL) 50 MG 24 hr tablet Take 1 tablet (50 mg total) by mouth daily. Take with or immediately following a meal. 01/21/16   Cheryln Manly, NP  nitroGLYCERIN (NITROSTAT) 0.4 MG SL tablet Place 1 tablet (0.4 mg  total) under the tongue every 5 (five) minutes x 3 doses as needed for chest pain. 01/21/16   Cheryln Manly, NP  ondansetron (ZOFRAN ODT) 4 MG disintegrating tablet Take 1 tablet (4 mg total) by mouth every 8 (eight) hours as needed for nausea or vomiting. 02/04/16   Pleas Koch, NP  rosuvastatin (CRESTOR) 10 MG tablet  04/07/16   Historical Provider, MD  sertraline (ZOLOFT) 50 MG tablet Take 1 tablet (50 mg total) by mouth daily. 03/24/16   Pleas Koch, NP    Family History Family History  Problem Relation Age of Onset  . Lung cancer Father   . Emphysema Father   . Depression Mother   . CAD Brother   . Heart attack Brother     MI in his 77's    Social History Social History  Substance Use Topics  . Smoking status: Former Smoker    Quit date: 09/04/2004  . Smokeless tobacco: Never Used  . Alcohol use No     Allergies   Levaquin [levofloxacin in d5w] and Penicillins   Review of Systems Review of Systems  Cardiovascular: Positive for chest pain.  All other systems reviewed and are negative.    Physical Exam Updated Vital Signs BP 140/92   Pulse 92   Temp 97.6 F (36.4 C) (Oral)   Resp 16   Ht 5\' 9"  (1.753 m)   Wt 148 lb (67.1 kg)   SpO2 95%   BMI 21.86 kg/m   Physical Exam  Constitutional: He is oriented to person, place, and time. He appears well-developed and well-nourished.  HENT:  Head: Normocephalic and atraumatic.  Right Ear: External ear normal.  Left Ear: External ear normal.  Nose: Nose normal.  Mouth/Throat: Oropharynx is clear and moist.  Eyes: Conjunctivae and EOM are normal. Pupils are equal, round, and reactive to light.  Neck: Normal range of motion. Neck supple.  Cardiovascular: Normal rate, regular rhythm, normal heart sounds and intact distal pulses.   Pulmonary/Chest: Effort normal and breath sounds normal.  Abdominal: Soft. Bowel sounds are normal.  Neurological: He is alert and oriented to person, place, and time. He  displays tremor.  Skin: Skin is warm.  Psychiatric: He has a normal mood and affect. His behavior is normal. Judgment and thought content normal.  Nursing note and vitals reviewed.    ED Treatments / Results  Labs (all labs ordered are listed, but only abnormal results are displayed) Labs Reviewed  COMPREHENSIVE METABOLIC PANEL - Abnormal; Notable for the following:       Result Value   Potassium 3.4 (*)    Glucose, Bld 125 (*)    ALT 12 (*)  All other components within normal limits  CBC  TROPONIN I  MAGNESIUM  PROTIME-INR  TROPONIN I  TROPONIN I  URINALYSIS, ROUTINE W REFLEX MICROSCOPIC  CBC  CREATININE, SERUM  TROPONIN I  TROPONIN I  TROPONIN I    EKG  EKG Interpretation  Date/Time:  Saturday June 19 2016 10:43:34 EST Ventricular Rate:  89 PR Interval:  162 QRS Duration: 84 QT Interval:  398 QTC Calculation: 484 R Axis:   55 Text Interpretation:  Normal sinus rhythm Possible Anterior infarct , age undetermined Marked ST abnormality, possible inferior subendocardial injury Abnormal ECG No significant change since last tracing Confirmed by [facility] MD, Nicolet Griffy ([id number]) on 06/19/2016 10:52:39 AM       Radiology Dg Chest 2 View  Result Date: 06/19/2016 CLINICAL DATA:  Left-sided chest pain for 2 days EXAM: CHEST  2 VIEW COMPARISON:  02/04/2016 FINDINGS: The heart size and mediastinal contours are within normal limits. Both lungs are clear. The visualized skeletal structures are unremarkable. IMPRESSION: No active cardiopulmonary disease. Electronically Signed   By: Kathreen Devoid   On: 06/19/2016 11:18    Procedures Procedures (including critical care time)  Medications Ordered in ED Medications  nitroGLYCERIN (NITROSTAT) SL tablet 0.4 mg (0.4 mg Sublingual Given 06/19/16 1207)  levETIRAcetam (KEPPRA) tablet 500 mg (not administered)  carbidopa-levodopa (SINEMET IR) 25-100 MG per tablet immediate release 1.5 tablet (not administered)  rosuvastatin (CRESTOR)  tablet 20 mg (not administered)  clopidogrel (PLAVIX) tablet 75 mg (not administered)  albuterol (PROVENTIL) (2.5 MG/3ML) 0.083% nebulizer solution 2.5 mg (not administered)  sertraline (ZOLOFT) tablet 50 mg (not administered)  losartan (COZAAR) tablet 25 mg (not administered)  metoprolol succinate (TOPROL-XL) 24 hr tablet 50 mg (not administered)  aspirin EC tablet 81 mg (not administered)  acetaminophen (TYLENOL) tablet 650 mg (not administered)  ondansetron (ZOFRAN) injection 4 mg (not administered)  heparin injection 5,000 Units (not administered)  gi cocktail (Maalox,Lidocaine,Donnatal) (not administered)  pantoprazole (PROTONIX) EC tablet 40 mg (not administered)  potassium chloride SA (K-DUR,KLOR-CON) CR tablet 20 mEq (not administered)  aspirin chewable tablet 324 mg (324 mg Oral Given 06/19/16 1135)     Initial Impression / Assessment and Plan / ED Course  I have reviewed the triage vital signs and the nursing notes.  Pertinent labs & imaging results that were available during my care of the patient were reviewed by me and considered in my medical decision making (see chart for details).  Pain gone with 2nd nitro.   Pt d/w hospitalists for admission.  Dr. Janne Napoleon ([facility]) asked that I call cardiology to see pt.  I spoke with Dr. Caryl Comes (cardiology) who will see him.  Final Clinical Impressions(s) / ED Diagnoses   Final diagnoses:  Unstable angina [facility])    New Prescriptions New Prescriptions   No medications on file     Isla Pence, MD 06/19/16 1247

## 2016-06-19 NOTE — H&P (Signed)
[facility] History and Physical  [name] GLO:[id number] DOB: May 14, 1940 DOA: 06/19/2016  Referring physician: ER MD PCP: Sheral Flow, NP   Chief Complaint: chest pain  HPI: [name] is a 76 y.o. male with history of hypertension, hyperlipidemia, COPD, coronary artery disease admitted in September 2017 for chest pain, and again hospitalized in 01/15/2016 for NSTEMI, status post left heart catheterization with Dr. Haroldine Laws showing 99% stenosis, OM-1 treated with a DES 1 by Dr. Irish Lack.  EF noted 50-55%.  He was seen by Cards Dr Meda Coffee 06/17/16 notes mention Brilinta, but pt currently on asa/plavix in medrec (confirmation pending).  Of note, pt did not have cp at cardiology office, but Dr Meda Coffee did order vascular US LE art 06/17/16.   Pt states that yesterday woke up w/ left sided sharp, constant pains, lasted almost all day. Did not go away until took his medications, did not take ntgs sl, which he has in his pocket.  Hx of intermittant acid reflux, but only takes as needed meds. States he did have pizza yesterday, but the pain was not associated to what he ate.  Pain gradually went away, but came back again this am.  Wife drove him to ED, where they gave him asa and ntg sl x 2, which resolved his pain.  Denies orthopnea, proximal nocturnal dyspnea. Denies any nausea, vomiting, palpitations, diaphoresis, arm, neck pain.  Denies cp currently.  Wife is at bedside.    Review of Systems:  Per hpi, o/w all systems reviewed and negative.  Past Medical History:  Diagnosis Date  . Chest pain   . COPD (chronic obstructive pulmonary disease) ([place])   . Depression   . Diabetes ([place])   . Dysphagia   . Essential tremor   . Heart disease   . Hyperlipidemia   . Hypertension   . Seizure disorder ([place])   . Tardive dyskinesia   . Vitamin D deficiency    Past Surgical History:  Procedure Laterality Date  . CARDIAC CATHETERIZATION N/A 01/19/2016   Procedure:  Left Heart Cath and Coronary Angiography;  Surgeon: Jolaine Artist, MD;  Location: [facility];  Service: Cardiovascular;  Laterality: N/A;  . CARDIAC CATHETERIZATION  01/19/2016   Procedure: Coronary Stent Intervention;  Surgeon: Jettie Booze, MD;  Location: [facility];  Service: Cardiovascular;;  . INGUINAL HERNIA REPAIR Left as child   Social History:  reports that he quit smoking about 11 years ago. He has never used smokeless tobacco. He reports that he does not drink alcohol or use drugs.  Allergies  Allergen Reactions  . Levaquin [Levofloxacin In D5w] Nausea And Vomiting  . Penicillins Hives    Childhood allergic reaction per spouse - no other information available 01/18/16    Family History  Problem Relation Age of Onset  . Lung cancer Father   . Emphysema Father   . Depression Mother   . CAD Brother   . Heart attack Brother     MI in his 20's    Prior to Admission medications   Medication Sig Start Date End Date Taking? Authorizing Provider  acetaminophen (TYLENOL) 500 MG tablet Take 500 mg by mouth every 6 (six) hours as needed for headache (pain).    Historical Provider, MD  albuterol (PROVENTIL) (2.5 MG/3ML) 0.083% nebulizer solution Take 3 mLs (2.5 mg total) by nebulization 2 (two) times daily. 03/24/16   Pleas Koch, NP  aspirin EC 81 MG EC tablet Take 1 tablet (81  mg total) by mouth daily. 12/16/15   Belkys A Regalado, MD  carbidopa-levodopa (SINEMET IR) 25-100 MG tablet Take 1.5 tablets by mouth 3 (three) times daily. 05/21/16   Pieter Partridge, DO  clopidogrel (PLAVIX) 75 MG tablet Take 1 tablet (75 mg total) by mouth daily. 04/14/16   Dorothy Spark, MD  imiquimod (ALDARA) 5 % cream Apply 1 application topically 2 (two) times daily. 01/12/16   Historical Provider, MD  levETIRAcetam (KEPPRA) 500 MG tablet Take 500 mg by mouth 2 (two) times daily. 04/22/16   Historical Provider, MD  losartan (COZAAR) 25 MG tablet Take 1 tablet (25 mg total) by  mouth daily. 01/21/16   Cheryln Manly, NP  metoprolol succinate (TOPROL-XL) 50 MG 24 hr tablet Take 1 tablet (50 mg total) by mouth daily. Take with or immediately following a meal. 01/21/16   Cheryln Manly, NP  nitroGLYCERIN (NITROSTAT) 0.4 MG SL tablet Place 1 tablet (0.4 mg total) under the tongue every 5 (five) minutes x 3 doses as needed for chest pain. 01/21/16   Cheryln Manly, NP  ondansetron (ZOFRAN ODT) 4 MG disintegrating tablet Take 1 tablet (4 mg total) by mouth every 8 (eight) hours as needed for nausea or vomiting. 02/04/16   Pleas Koch, NP  rosuvastatin (CRESTOR) 10 MG tablet  04/07/16   Historical Provider, MD  sertraline (ZOLOFT) 50 MG tablet Take 1 tablet (50 mg total) by mouth daily. 03/24/16   Pleas Koch, NP   Physical Exam: Vitals:   06/19/16 1048 06/19/16 1049 06/19/16 1135 06/19/16 1145  BP:  173/88 178/74 140/92  Pulse:  82 73 92  Resp:  17 16   Temp:  97.6 F (36.4 C)    TempSrc:  Oral    SpO2:  100% 97% 95%  Weight: 67.1 kg (148 lb)     Height: 5\' 9"  (1.753 m)       Wt Readings from Last 3 Encounters:  06/19/16 67.1 kg (148 lb)  06/17/16 67.1 kg (148 lb)  05/21/16 66.9 kg (147 lb 8 oz)    General:  Appears calm and comfortable, pleasant, NAD, AAOx3, pleasant Eyes: PERRL, normal lids, irises & conjunctiva ENT: grossly normal hearing, lips & tongue, mmm Neck: no LAD, masses or thyromegaly, no jvp. Cardiovascular: RRR, no m/r/g. No LE edema. Telemetry: SR, no arrhythmias  Respiratory: CTA bilaterally, no w/r/r. Normal respiratory effort. Abdomen: soft, ntnd, +BS Skin: no rash or induration seen on limited exam, numerous skin lesions noted face/chest. Musculoskeletal: grossly normal tone BUE/BLE Psychiatric: grossly normal mood and affect, speech fluent and appropriate Neurologic: grossly non-focal. Mild resting tremor.          Labs on Admission:  Basic Metabolic Panel:  Recent Labs Lab 06/19/16 1103  NA 140  K 3.4*    CL 103  CO2 27  GLUCOSE 125*  BUN 10  CREATININE 0.73  CALCIUM 9.0  MG 2.0   Liver Function Tests:  Recent Labs Lab 06/19/16 1103  AST 19  ALT 12*  ALKPHOS 93  BILITOT 1.1  PROT 6.9  ALBUMIN 3.8   No results for input(s): LIPASE, AMYLASE in the last 168 hours. No results for input(s): AMMONIA in the last 168 hours. CBC:  Recent Labs Lab 06/19/16 1103  WBC 8.7  HGB 15.8  HCT 47.3  MCV 84.6  PLT 219   Cardiac Enzymes:  Recent Labs Lab 06/19/16 1103  TROPONINI <0.03    BNP (last 3 results)  Recent Labs  12/14/15 1105  BNP 15.6    ProBNP (last 3 results) No results for input(s): PROBNP in the last 8760 hours.  CBG: No results for input(s): GLUCAP in the last 168 hours.  Radiological Exams on Admission: Dg Chest 2 View  Result Date: 06/19/2016 CLINICAL DATA:  Left-sided chest pain for 2 days EXAM: CHEST  2 VIEW COMPARISON:  02/04/2016 FINDINGS: The heart size and mediastinal contours are within normal limits. Both lungs are clear. The visualized skeletal structures are unremarkable. IMPRESSION: No active cardiopulmonary disease. Electronically Signed   By: Kathreen Devoid   On: 06/19/2016 11:18    EKG: Independently reviewed.   EKG Interpretation  Date/Time:  Saturday June 19 2016 10:43:34 EST Ventricular Rate:  89 PR Interval:  162 QRS Duration: 84 QT Interval:  398 QTC Calculation: 484 R Axis:   55 Text Interpretation:  Normal sinus rhythm Possible Anterior infarct , age undetermined Marked ST abnormality, possible inferior subendocardial injury Abnormal ECG No significant change since last tracing Confirmed by [facility] MD, JULIE ([id number]) on 06/19/2016 10:52:39 AM        Assessment/Plan Principal Problem:   Chest pain Active Problems:   Hyperlipidemia   COPD (chronic obstructive pulmonary disease) (HCC)   Seizure disorder (HCC)   Parkinson's variant of multiple system atrophy ([place])   Status post coronary artery stent placement   CAD  (coronary artery disease)   1. Chest pain syndrome, w/ CAD - obs tele - resolved after ntg and asa in ED - cards cs, appreciate assistance - serial cardiac enzymes, am ekg - ntg sl prn - trial ppi/gi cocktail as well to r/o gi cause  2. CAD - Sp left heart catheterization 01/19/16 with Dr. Haroldine Laws showing 99% stenosis, OM-1 treated with a DES 1 by Dr. Irish Lack.  EF noted 50-55%. - continue crestor, asa 81 plavix, bb - of note, Dr Francesca Oman notes on 06/17/16, mention Brilinta, but I only see plavix in system, asked pharmacy to confirm w/ pt. - chk a1c  3. htn - continue losartan, toprol xl  4. Hypokalemia - kdur 41meq x 1 ordered  5. hld - continue crestor - am fasting lipid panel  6. Copd - stable, remote tobacco - prn albuterol nebs  7. Parkinson's disease - continue sinemet  8. Seizure disorder - continue keppra  Cardiology c/s per ER Call, appreciate assistance, will f/u w/ recds.  Code Status: full  DVT Prophylaxis: hep [place] tid Family Communication: pt and wife at bedside Disposition Plan: obs tele  Time spent: 71mins  Maren Reamer MD., MBA/MHA [facility] Pager [phone]

## 2016-06-20 ENCOUNTER — Observation Stay (HOSPITAL_COMMUNITY): Payer: PPO

## 2016-06-20 DIAGNOSIS — I10 Essential (primary) hypertension: Secondary | ICD-10-CM | POA: Diagnosis not present

## 2016-06-20 DIAGNOSIS — E785 Hyperlipidemia, unspecified: Secondary | ICD-10-CM | POA: Diagnosis not present

## 2016-06-20 DIAGNOSIS — R0789 Other chest pain: Secondary | ICD-10-CM | POA: Diagnosis not present

## 2016-06-20 DIAGNOSIS — R55 Syncope and collapse: Secondary | ICD-10-CM | POA: Diagnosis not present

## 2016-06-20 DIAGNOSIS — J449 Chronic obstructive pulmonary disease, unspecified: Secondary | ICD-10-CM | POA: Diagnosis not present

## 2016-06-20 DIAGNOSIS — R29810 Facial weakness: Secondary | ICD-10-CM | POA: Diagnosis not present

## 2016-06-20 DIAGNOSIS — E1151 Type 2 diabetes mellitus with diabetic peripheral angiopathy without gangrene: Secondary | ICD-10-CM | POA: Diagnosis not present

## 2016-06-20 DIAGNOSIS — R092 Respiratory arrest: Secondary | ICD-10-CM | POA: Diagnosis not present

## 2016-06-20 DIAGNOSIS — I471 Supraventricular tachycardia: Secondary | ICD-10-CM | POA: Diagnosis not present

## 2016-06-20 DIAGNOSIS — E876 Hypokalemia: Secondary | ICD-10-CM

## 2016-06-20 DIAGNOSIS — R079 Chest pain, unspecified: Secondary | ICD-10-CM

## 2016-06-20 DIAGNOSIS — R072 Precordial pain: Secondary | ICD-10-CM | POA: Diagnosis not present

## 2016-06-20 DIAGNOSIS — G40909 Epilepsy, unspecified, not intractable, without status epilepticus: Secondary | ICD-10-CM

## 2016-06-20 DIAGNOSIS — G232 Striatonigral degeneration: Secondary | ICD-10-CM | POA: Diagnosis not present

## 2016-06-20 DIAGNOSIS — Z955 Presence of coronary angioplasty implant and graft: Secondary | ICD-10-CM | POA: Diagnosis not present

## 2016-06-20 DIAGNOSIS — I2510 Atherosclerotic heart disease of native coronary artery without angina pectoris: Secondary | ICD-10-CM | POA: Diagnosis not present

## 2016-06-20 DIAGNOSIS — I493 Ventricular premature depolarization: Secondary | ICD-10-CM | POA: Diagnosis not present

## 2016-06-20 DIAGNOSIS — G20 Parkinson's disease: Secondary | ICD-10-CM | POA: Diagnosis not present

## 2016-06-20 LAB — LIPID PANEL
CHOL/HDL RATIO: 6.2 ratio
Cholesterol: 185 mg/dL (ref 0–200)
HDL: 30 mg/dL — AB (ref >40)
LDL Cholesterol: 116 mg/dL — ABNORMAL HIGH (ref 0–99)
TRIGLYCERIDES: 195 mg/dL — AB (ref <150)
VLDL: 39 mg/dL (ref 0–40)

## 2016-06-20 LAB — COMPREHENSIVE METABOLIC PANEL
ALT: 5 U/L — ABNORMAL LOW (ref 17–63)
ANION GAP: 7 (ref 5–15)
AST: 17 U/L (ref 15–41)
Albumin: 3.1 g/dL — ABNORMAL LOW (ref 3.5–5.0)
Alkaline Phosphatase: 78 U/L (ref 38–126)
BILIRUBIN TOTAL: 0.9 mg/dL (ref 0.3–1.2)
BUN: 10 mg/dL (ref 6–20)
CO2: 29 mmol/L (ref 22–32)
Calcium: 8.5 mg/dL — ABNORMAL LOW (ref 8.9–10.3)
Chloride: 103 mmol/L (ref 101–111)
Creatinine, Ser: 0.81 mg/dL (ref 0.61–1.24)
Glucose, Bld: 104 mg/dL — ABNORMAL HIGH (ref 65–99)
POTASSIUM: 3.3 mmol/L — AB (ref 3.5–5.1)
Sodium: 139 mmol/L (ref 135–145)
TOTAL PROTEIN: 6 g/dL — AB (ref 6.5–8.1)

## 2016-06-20 LAB — CBC WITH DIFFERENTIAL/PLATELET
Basophils Absolute: 0 10*3/uL (ref 0.0–0.1)
Basophils Relative: 0 %
Eosinophils Absolute: 0.3 10*3/uL (ref 0.0–0.7)
Eosinophils Relative: 4 %
HEMATOCRIT: 42.2 % (ref 39.0–52.0)
Hemoglobin: 13.7 g/dL (ref 13.0–17.0)
LYMPHS PCT: 31 %
Lymphs Abs: 2.3 10*3/uL (ref 0.7–4.0)
MCH: 27.2 pg (ref 26.0–34.0)
MCHC: 32.5 g/dL (ref 30.0–36.0)
MCV: 83.9 fL (ref 78.0–100.0)
MONO ABS: 0.9 10*3/uL (ref 0.1–1.0)
MONOS PCT: 12 %
NEUTROS ABS: 4 10*3/uL (ref 1.7–7.7)
Neutrophils Relative %: 53 %
Platelets: 209 10*3/uL (ref 150–400)
RBC: 5.03 MIL/uL (ref 4.22–5.81)
RDW: 13.2 % (ref 11.5–15.5)
WBC: 7.6 10*3/uL (ref 4.0–10.5)

## 2016-06-20 LAB — TROPONIN I: Troponin I: <0.03 ng/mL (ref <0.03)

## 2016-06-20 LAB — GLUCOSE, CAPILLARY: Glucose-Capillary: 98 mg/dL (ref 65–99)

## 2016-06-20 LAB — PHOSPHORUS: PHOSPHORUS: 3.9 mg/dL (ref 2.5–4.6)

## 2016-06-20 LAB — HEMOGLOBIN A1C
HEMOGLOBIN A1C: 5.3 % (ref 4.8–5.6)
MEAN PLASMA GLUCOSE: 105 mg/dL

## 2016-06-20 MED ORDER — POTASSIUM CHLORIDE CRYS ER 20 MEQ PO TBCR
40.0000 meq | EXTENDED_RELEASE_TABLET | Freq: Once | ORAL | Status: AC
  Administered 2016-06-20: 40 meq via ORAL
  Filled 2016-06-20: qty 2

## 2016-06-20 MED ORDER — LORAZEPAM 2 MG/ML IJ SOLN: 1.0000 mg | Freq: Once | INTRAMUSCULAR | Status: DC

## 2016-06-20 MED ORDER — LORAZEPAM 2 MG/ML IJ SOLN
INTRAMUSCULAR | Status: AC
  Filled 2016-06-20: qty 1

## 2016-06-20 MED ORDER — ROSUVASTATIN CALCIUM 10 MG PO TABS: 40.0000 mg | ORAL_TABLET | Freq: Every day | ORAL | Status: DC

## 2016-06-20 MED ORDER — LEVETIRACETAM 500 MG PO TABS: 500.0000 mg | ORAL_TABLET | Freq: Two times a day (BID) | ORAL | 0 refills | Status: DC

## 2016-06-20 NOTE — Progress Notes (Signed)
RT NOTE:  RT reports to assess unresponsive patient. AMBU assist for 100%. Pt respirations shallow. Sats 100%. Titrated to 100% NRB. Pt slowly beginning to respond. Rapid Response @ bedside to transport patient to CT.

## 2016-06-20 NOTE — Consult Note (Signed)
Referring Physician: Dr. Janne Napoleon    Chief Complaint: Acute respiratory arrest with unresponsiveness  HPI: [name] is an 76 y.o. male with a PMHx of Parkinson's disease and seizure disorder who was admitted on 3/10 for chest pain syndrome. The chest pain syndrome is on a background history of hypertension, hyperlipidemia, COPD and CAD. He was hospitalized in 01/15/2016 for NSTEMI, status post left heart catheterization showing 99% stenosis, OM-1 treated with a DES 1.  EF was 50-55%.  He was on DAPT with ASA and Plavix, which have been continued this admission. Also on Keppra 500 mg BID for seizure disorder and Sinemet IR 25/100 1.5 tabs TID for his Parkinson's disease.   Early this morning, he complained of chest pain followed by an apneic episode; he was unresponsive after respirations resumed. Code Stroke was called.    RT note: "RT reports to assess unresponsive patient. AMBU assist for 100%. Pt respirations shallow. Sats 100%. Titrated to 100% NRB. Pt slowly beginning to respond. Rapid Response @ bedside to transport patient to CT."   Past Medical History:  Diagnosis Date  . Chest pain   . COPD (chronic obstructive pulmonary disease) ([place])   . Depression   . Diabetes ([place])   . Dysphagia   . Essential tremor   . Heart disease   . Hyperlipidemia   . Hypertension   . Seizure disorder ([place])   . Tardive dyskinesia   . Vitamin D deficiency     Past Surgical History:  Procedure Laterality Date  . CARDIAC CATHETERIZATION N/A 01/19/2016   Procedure: Left Heart Cath and Coronary Angiography;  Surgeon: Jolaine Artist, MD;  Location: [facility];  Service: Cardiovascular;  Laterality: N/A;  . CARDIAC CATHETERIZATION  01/19/2016   Procedure: Coronary Stent Intervention;  Surgeon: Jettie Booze, MD;  Location: [facility];  Service: Cardiovascular;;  . INGUINAL HERNIA REPAIR Left as child    Family History  Problem Relation Age of Onset  . Lung cancer  Father   . Emphysema Father   . Depression Mother   . CAD Brother   . Heart attack Brother     MI in his 41's   Social History:  reports that he quit smoking about 11 years ago. He has never used smokeless tobacco. He reports that he does not drink alcohol or use drugs.  Allergies:  Allergies  Allergen Reactions  . Levaquin [Levofloxacin In D5w] Nausea And Vomiting  . Penicillins Hives    Childhood allergic reaction per spouse - no other information available 01/18/16    Medications:  Scheduled: . albuterol  2.5 mg Nebulization BID  . aspirin EC  81 mg Oral Daily  . carbidopa-levodopa  1.5 tablet Oral TID  . clopidogrel  75 mg Oral Daily  . heparin  5,000 Units Subcutaneous Q8H  . levETIRAcetam  500 mg Oral BID  . losartan  25 mg Oral Daily  . metoprolol succinate  50 mg Oral Daily  . pantoprazole  40 mg Oral Daily  . rosuvastatin  20 mg Oral q1800  . sertraline  50 mg Oral Daily    ROS: Unable to obtain due to AMS.   Physical Examination: Blood pressure (!) 164/73, pulse 72, temperature 97.4 F (36.3 C), temperature source Axillary, resp. rate 20, height 5' 9"  (1.753 m), weight 64.9 kg (143 lb 1.6 oz), SpO2 100 %.  General: Frail appearing with eyes open. Initially not responding to commands.  HEENT: /AT.  Lungs: On NRB mask  Neurologic Examination: Eyes open and initially not responding to questions or commands. With extended application of sternal rub, the patient becomes more alert and purposefully grabs examiner's hand with 5/5 strength, using both RUE and LUE. No asymmetry noted. Noxious stimuli applied to soles of feet results in 5/5 purposeful withdrawal bilaterally. After becoming more alert, the patient follows commands to stick out tongue and he attempts to smile. No definite facial droop noted, with tongue protrusion midline. Gazes to left and right and blinks to threat in both visual fields. Pupils equally reactive. Reflexes 2+ in upper and lower extremities.  Tone increased in bilateral upper and lower extremities. No neck stiffness noted.   Results for orders placed or performed during the hospital encounter of 06/19/16 (from the past 48 hour(s))  CBC     Status: None   Collection Time: 06/19/16 11:03 AM  Result Value Ref Range   WBC 8.7 4.0 - 10.5 K/uL   RBC 5.59 4.22 - 5.81 MIL/uL   Hemoglobin 15.8 13.0 - 17.0 g/dL   HCT 47.3 39.0 - 52.0 %   MCV 84.6 78.0 - 100.0 fL   MCH 28.3 26.0 - 34.0 pg   MCHC 33.4 30.0 - 36.0 g/dL   RDW 13.4 11.5 - 15.5 %   Platelets 219 150 - 400 K/uL  Comprehensive metabolic panel     Status: Abnormal   Collection Time: 06/19/16 11:03 AM  Result Value Ref Range   Sodium 140 135 - 145 mmol/L   Potassium 3.4 (L) 3.5 - 5.1 mmol/L   Chloride 103 101 - 111 mmol/L   CO2 27 22 - 32 mmol/L   Glucose, Bld 125 (H) 65 - 99 mg/dL   BUN 10 6 - 20 mg/dL   Creatinine, Ser 0.73 0.61 - 1.24 mg/dL   Calcium 9.0 8.9 - 10.3 mg/dL   Total Protein 6.9 6.5 - 8.1 g/dL   Albumin 3.8 3.5 - 5.0 g/dL   AST 19 15 - 41 U/L   ALT 12 (L) 17 - 63 U/L   Alkaline Phosphatase 93 38 - 126 U/L   Total Bilirubin 1.1 0.3 - 1.2 mg/dL   GFR calc non Af Amer >60 >60 mL/min   GFR calc Af Amer >60 >60 mL/min    Comment: (NOTE) The eGFR has been calculated using the CKD EPI equation. This calculation has not been validated in all clinical situations. eGFR's persistently <60 mL/min signify possible Chronic Kidney Disease.    Anion gap 10 5 - 15  Troponin I  (0, 3, 6)     Status: None   Collection Time: 06/19/16 11:03 AM  Result Value Ref Range   Troponin I <0.03 <0.03 ng/mL  Magnesium     Status: None   Collection Time: 06/19/16 11:03 AM  Result Value Ref Range   Magnesium 2.0 1.7 - 2.4 mg/dL  Protime-INR     Status: None   Collection Time: 06/19/16 11:03 AM  Result Value Ref Range   Prothrombin Time 12.9 11.4 - 15.2 seconds   INR 0.97   Troponin I (q 6hr x 3)     Status: None   Collection Time: 06/19/16  5:55 PM  Result Value Ref  Range   Troponin I <0.03 <0.03 ng/mL  Troponin I (q 6hr x 3)     Status: None   Collection Time: 06/19/16  6:29 PM  Result Value Ref Range   Troponin I <0.03 <0.03 ng/mL  Urinalysis, Routine w reflex microscopic  Status: Abnormal   Collection Time: 06/19/16 11:24 PM  Result Value Ref Range   Color, Urine YELLOW YELLOW   APPearance CLOUDY (A) CLEAR   Specific Gravity, Urine 1.014 1.005 - 1.030   pH 7.0 5.0 - 8.0   Glucose, UA NEGATIVE NEGATIVE mg/dL   Hgb urine dipstick NEGATIVE NEGATIVE   Bilirubin Urine NEGATIVE NEGATIVE   Ketones, ur NEGATIVE NEGATIVE mg/dL   Protein, ur NEGATIVE NEGATIVE mg/dL   Nitrite NEGATIVE NEGATIVE   Leukocytes, UA NEGATIVE NEGATIVE  Troponin I (q 6hr x 3)     Status: None   Collection Time: 06/20/16 12:09 AM  Result Value Ref Range   Troponin I <0.03 <0.03 ng/mL  Glucose, capillary     Status: None   Collection Time: 06/20/16  4:02 AM  Result Value Ref Range   Glucose-Capillary 98 65 - 99 mg/dL   Dg Chest 2 View  Result Date: 06/19/2016 CLINICAL DATA:  Left-sided chest pain for 2 days EXAM: CHEST  2 VIEW COMPARISON:  02/04/2016 FINDINGS: The heart size and mediastinal contours are within normal limits. Both lungs are clear. The visualized skeletal structures are unremarkable. IMPRESSION: No active cardiopulmonary disease. Electronically Signed   By: Kathreen Devoid   On: 06/19/2016 11:18    Assessment: 76 y.o. male with episode of apnea and unresponsiveness.  1. Rapidly improving after application of noxious stimuli, with no asymmetry seen on Neurological examination. AMS most likely secondary to cognitive fluctuation in the context of Parkinson's disease. Subclinical seizure with postictal state also on DDx. Toxic/metabolic or infectious etiologies also possible. Low suspicion for meningitis given no neck stiffness on exam and he does not appear toxic. 2. CT head without acute abnormality.  3. Parkinson's disease. On Sinemet TID.  4. Seizure disorder.  Seizure prophylaxis with Keppra 500 mg BID.   Plan: 1. Toxic/metabolic/infectious work up.  2. Primary team to assess for underlying etiology of apneic episode.  3. Continue Keppra.  4. Continue Sinemet.  5. MRI brain.   @Electronically  signed: Dr. Kerney Elbe  06/20/2016, 5:02 AM

## 2016-06-20 NOTE — Progress Notes (Signed)
PROGRESS NOTE        PATIENT DETAILS Name: [name] Age: 76 y.o. Sex: male Date of Birth: Feb 20, 1941 Admit Date: 06/19/2016 Admitting Physician Maren Reamer, MD DXA:JOINO,MVEHMCNOB Lorre Nick, NP  Brief Narrative: Patient is a 76 y.o. male with known history of CAD with PCI in October 2017, no history of seizure disorder, hypertension, dyslipidemia presented for evaluation of left-sided chest pain. Post admission, on 3/11 AM patient sustained a very syncopal episode.See below for further details.   Subjective: No further chest pain this morning. Claims his prior seizure episodes were mostly syncopal in nature.  Assessment/Plan: Atypical chest pain: No further chest pain this morning, troponins negative-no major EKG changes evident. Cardiology planning on a LHC.  Syncopal episode early 3/11 morning: Suspected to be a seizure. However, reviewed outpatient neurology note on 2/9 indicates that patient had prior extensive workup for seizures including 24-hour ambulatory EEGs that were negative-he was thought to have nonepileptic seizures/events. His Keppra was recently discontinued by his primary neurologist. CT head negative, await further recommendations from neurology, he has been restarted on Keppra. Continue to monitor in telemetry.  Hypokalemia: Replete and recheck.  History of CAD status post PCI October 2017: See above regarding chest pain. Continue antiplatelet agents, metoprolol and statin.  History of multisystem atrophy/Parkinson's disease: Followed by neurology in the outpatient setting, continue with Sinemet.  Hypertension: Controlled, continue metoprolol, losartan.  COPD: Stable, continue as needed nebs.  DVT Prophylaxis: Prophylactic Heparin   Code Status: Full code  Family Communication: None at bedside  Disposition Plan: Remain inpatient-home when work up complete  Antimicrobial agents: Anti-infectives    None      Procedures: None  CONSULTS:  cardiology and neurology  Time spent: 25- minutes-Greater than 50% of this time was spent in counseling, explanation of diagnosis, planning of further management, and coordination of care.  MEDICATIONS: Scheduled Meds: . albuterol  2.5 mg Nebulization BID  . aspirin EC  81 mg Oral Daily  . carbidopa-levodopa  1.5 tablet Oral TID  . clopidogrel  75 mg Oral Daily  . heparin  5,000 Units Subcutaneous Q8H  . levETIRAcetam  500 mg Oral BID  . LORazepam  1 mg Intravenous Once  . losartan  25 mg Oral Daily  . metoprolol succinate  50 mg Oral Daily  . pantoprazole  40 mg Oral Daily  . rosuvastatin  20 mg Oral q1800  . sertraline  50 mg Oral Daily   Continuous Infusions: PRN Meds:.acetaminophen, gi cocktail, nitroGLYCERIN, ondansetron (ZOFRAN) IV   PHYSICAL EXAM: Vital signs: Vitals:   06/20/16 0415 06/20/16 0416 06/20/16 0536 06/20/16 0803  BP: (!) 164/73  136/74   Pulse: 72  60   Resp: 20  (!) 22   Temp: 97.4 F (36.3 C)  98.2 F (36.8 C)   TempSrc: Axillary  Oral   SpO2: 95% 100% 97% 97%  Weight:      Height:       Filed Weights   06/19/16 1048 06/19/16 1341  Weight: 67.1 kg (148 lb) 64.9 kg (143 lb 1.6 oz)   Body mass index is 21.13 kg/m.   General appearance :Awake, alert, not in any distress. Speech Clear.  Eyes:, pupils equally reactive to light and accomodation,no scleral icterus. HEENT: Atraumatic and Normocephalic Neck: supple, no JVD. No cervical lymphadenopathy. Resp:Good air entry bilaterally, no added sounds  CVS: S1 S2 regular, no murmurs.  GI: Bowel sounds present, Non tender and not distended with no gaurding, rigidity or rebound.No organomegaly Extremities: B/L Lower Ext shows no edema, both legs are warm to touch Neurology:  speech clear,Non focal, sensation is grossly intact. Musculoskeletal:No digital cyanosis Skin:No Rash, warm and dry Wounds:N/A  I have personally reviewed following labs and imaging  studies  LABORATORY DATA: CBC:  Recent Labs Lab 06/19/16 1103 06/20/16 0504  WBC 8.7 7.6  NEUTROABS  --  4.0  HGB 15.8 13.7  HCT 47.3 42.2  MCV 84.6 83.9  PLT 219 263    Basic Metabolic Panel:  Recent Labs Lab 06/19/16 1103 06/20/16 0504  NA 140 139  K 3.4* 3.3*  CL 103 103  CO2 27 29  GLUCOSE 125* 104*  BUN 10 10  CREATININE 0.73 0.81  CALCIUM 9.0 8.5*  MG 2.0  --   PHOS  --  3.9    GFR: Estimated Creatinine Clearance: 73.4 mL/min (by C-G formula based on SCr of 0.81 mg/dL).  Liver Function Tests:  Recent Labs Lab 06/19/16 1103 06/20/16 0504  AST 19 17  ALT 12* 5*  ALKPHOS 93 78  BILITOT 1.1 0.9  PROT 6.9 6.0*  ALBUMIN 3.8 3.1*   No results for input(s): LIPASE, AMYLASE in the last 168 hours. No results for input(s): AMMONIA in the last 168 hours.  Coagulation Profile:  Recent Labs Lab 06/19/16 1103  INR 0.97    Cardiac Enzymes:  Recent Labs Lab 06/19/16 1103 06/19/16 1755 06/19/16 1829 06/20/16 0009 06/20/16 0504  TROPONINI <0.03 <0.03 <0.03 <0.03 <0.03    BNP (last 3 results) No results for input(s): PROBNP in the last 8760 hours.  HbA1C:  Recent Labs  06/19/16 1103  HGBA1C 5.3    CBG:  Recent Labs Lab 06/20/16 0402  GLUCAP 98    Lipid Profile:  Recent Labs  06/20/16 0504  CHOL 185  HDL 30*  LDLCALC 116*  TRIG 195*  CHOLHDL 6.2    Thyroid Function Tests: No results for input(s): TSH, T4TOTAL, FREET4, T3FREE, THYROIDAB in the last 72 hours.  Anemia Panel: No results for input(s): VITAMINB12, FOLATE, FERRITIN, TIBC, IRON, RETICCTPCT in the last 72 hours.  Urine analysis:    Component Value Date/Time   COLORURINE YELLOW 06/19/2016 2324   APPEARANCEUR CLOUDY (A) 06/19/2016 2324   APPEARANCEUR Clear 05/13/2014 1251   LABSPEC 1.014 06/19/2016 2324   LABSPEC 1.013 05/13/2014 1251   PHURINE 7.0 06/19/2016 2324   GLUCOSEU NEGATIVE 06/19/2016 2324   GLUCOSEU Negative 05/13/2014 1251   HGBUR NEGATIVE  06/19/2016 2324   BILIRUBINUR NEGATIVE 06/19/2016 2324   BILIRUBINUR Negative 05/13/2014 1251   KETONESUR NEGATIVE 06/19/2016 2324   PROTEINUR NEGATIVE 06/19/2016 2324   NITRITE NEGATIVE 06/19/2016 2324   LEUKOCYTESUR NEGATIVE 06/19/2016 2324   LEUKOCYTESUR Negative 05/13/2014 1251    Sepsis Labs: Lactic Acid, Venous No results found for: LATICACIDVEN  MICROBIOLOGY: No results found for this or any previous visit (from the past 240 hour(s)).  RADIOLOGY STUDIES/RESULTS: Dg Chest 2 View  Result Date: 06/19/2016 CLINICAL DATA:  Left-sided chest pain for 2 days EXAM: CHEST  2 VIEW COMPARISON:  02/04/2016 FINDINGS: The heart size and mediastinal contours are within normal limits. Both lungs are clear. The visualized skeletal structures are unremarkable. IMPRESSION: No active cardiopulmonary disease. Electronically Signed   By: Kathreen Devoid   On: 06/19/2016 11:18   Ct Head Code Stroke Wo Contrast  Result Date: 06/20/2016 CLINICAL DATA:  Code  stroke. Initial evaluation for left-sided facial droop. EXAM: CT HEAD WITHOUT CONTRAST TECHNIQUE: Contiguous axial images were obtained from the base of the skull through the vertex without intravenous contrast. COMPARISON:  Prior CT from 12/15/2015. FINDINGS: Brain: Generalized age-related cerebral atrophy with mild chronic small vessel ischemic disease. Possible small remote left occipital infarct noted. No evidence for acute intracranial hemorrhage. No findings to suggest acute large vessel territory infarct. No mass lesion, midline shift, or mass effect. No hydrocephalus. No extra-axial fluid collection. Vascular: No hyperdense vessel. Scattered vascular calcifications present within the carotid siphons. Skull: Scalp soft tissues demonstrate no acute abnormality. Calvarium intact. Sinuses/Orbits: Globes and orbital soft tissues within normal limits. Chronic opacification of the right ethmoidal air cells noted. Paranasal sinuses are otherwise clear. No  mastoid effusion. Other: None ASPECTS ([place] Stroke Program Early CT Score) - Ganglionic level infarction (caudate, lentiform nuclei, internal capsule, insula, M1-M3 cortex): 7 - Supraganglionic infarction (M4-M6 cortex): 3 Total score (0-10 with 10 being normal): 10 IMPRESSION: 1. No acute intracranial infarct or other process identified. 2. ASPECTS is 10 3. Mild age-related cerebral atrophy with chronic small vessel ischemic disease. Critical Value/emergent results were called by telephone at the time of interpretation on 06/20/2016 at 4:59 am to Dr. Cheral Marker , who verbally acknowledged these results. Electronically Signed   By: Jeannine Boga M.D.   On: 06/20/2016 05:01     LOS: 0 days   Oren Binet, MD  [facility] Pager:[phone]  If 7PM-7AM, please contact night-coverage [email or web address] Password [facility] 06/20/2016, 10:49 AM

## 2016-06-20 NOTE — Progress Notes (Signed)
Patient ID: [name], male   DOB: [birth year], 76 y.o.   MRN: [id number]   Called by nursing regarding rapid response called.  Patient reportedly complained of chest pain while his vitals were being taken then suddenly lost consciousness/  Nurse reported that patient's eyes rolled back in his head, he became apneic and extremities went limp.  He was nonverbal and unable to follow commands.  He was rushed for head CT.    On my arrival patient was arousable, could say his name but was confused.  He could follow some commands but mostly just shook his head "no."   General:  Pale, appears confused. No acute distress ENT: EOMI, pupils equally reactive CVS: Regular +S1S2 Lungs: Clear bilaterally Abd: Soft NTND EXT: Initially motor strength 0/5 x 4 extremities.  Repeat exam once symptoms resolve MS 5/5 x4 extremities.   CNS: Awake, alert, oriented x2. Follows commands.  Face appears symmetric, speech is clear.   Vitals signs remained stable throughout the event.  EKG NSR.  CT head negative for acute event. Patient's wife arrived during my exam and informed me that patient has a longstanding history of seizure activity with similar presentation lasting 5-10 minutes (his eyes would roll back, he'd "go out" and then go limp with postictal confusion and headache).  Looking through notes, neurologist [name] called this convulsive syncope, but since patient had been symptom-free, Keppra was d/c'd on 05/21/16.  Here in the hospital seizure prophylaxis with Keppra 500BID has been ordered.    At this time, we will continue to monitor this patient on telemetry but we will add q4h neurochecks. Should his symptoms recur we can try Ativan however at this time, his mental status is clearing and he is close to his baseline mental status.  His wife is at bedside.  The plan of care has been discussed with the patient, his wife and nursing all of whom express understanding and are in agreement.

## 2016-06-20 NOTE — Discharge Summary (Signed)
PATIENT DETAILS Name: [name] Age: 76 y.o. Sex: male Date of Birth: [birth year] MRN: [id number]. Admitting Physician: Maren Reamer, MD [id number] Lorre Nick, NP  Admit Date: 06/19/2016 Discharge date: 06/20/2016  Recommendations for Outpatient Follow-up:  1. Follow up with PCP in 1-2 weeks 2. Please obtain BMP/CBC in one week 3. Please ensure outpatient follow-up with cardiology and neurology.  Admitted From:  Home  Disposition: [place]: No  Equipment/Devices: None  Discharge Condition: Stable  CODE STATUS: FULL CODE  Diet recommendation:  Heart Healthy  Brief Summary: See H&P, Labs, Consult and Test reports for all details in brief, Patient is a 76 y.o. male with known history of CAD with PCI in October 2017, no history of seizure disorder, hypertension, dyslipidemia presented for evaluation of left-sided chest pain. Post admission, on 3/11 AM patient sustained a very syncopal episode.See below for further details.   Brief Hospital Course: Atypical chest pain: No further chest pain this morning, troponins negative-no major EKG changes evident. Cardiology initially contemplated further workup, but with negative troponins, no further workup advised. Okay from cardiology point of view for discharge home today.  Syncopal episode early 3/11 morning: Suspected to be a seizure. However, reviewed outpatient neurology note on 2/9 indicates that patient had prior extensive workup for seizures including 24-hour ambulatory EEGs that were negative-he was thought to have nonepileptic seizures/events. His Keppra was recently discontinued by his primary neurologist. CT head negative, restarted on Keppra. Spoke with neurology on call, Dr. Shon Hale, no further recommendations apart from continuing [place] and following up with his primary neurologist upon discharge. He has no further recommendations and is okay with the patient being discharged home today.  Please note, while he had a syncopal event earlier this morning, and telemetry was negative.  Hypokalemia: Repleted, please recheck. At next visit  History of CAD status post PCI October 2017: See above regarding chest pain. Continue antiplatelet agents, metoprolol and statin.  History of multisystem atrophy/Parkinson's disease: Followed by neurology in the outpatient setting, continue with Sinemet.  Hypertension: Controlled, continue metoprolol, losartan.  COPD: Stable, continue as needed nebs.  Procedures/Studies: None  Discharge Diagnoses:  Principal Problem:   Chest pain Active Problems:   Hyperlipidemia   COPD (chronic obstructive pulmonary disease) (HCC)   Seizure disorder (HCC)   Parkinson's variant of multiple system atrophy ([place])   Status post coronary artery stent placement   CAD (coronary artery disease)   Hypokalemia   Discharge Instructions:  Activity:  As tolerated with Full fall precautions use walker/cane & assistance as needed   Discharge Instructions    Call MD for:  persistant dizziness or light-headedness    Complete by:  As directed    Call MD for:  persistant nausea and vomiting    Complete by:  As directed    Call MD for:  severe uncontrolled pain    Complete by:  As directed    Diet - low sodium heart healthy    Complete by:  As directed    Discharge instructions    Complete by:  As directed    Per [facility] statutes, patients with seizures are not allowed to drive until they have been seizure-free for six months. Use caution when using heavy equipment or power tools. Avoid working on ladders or at heights. Take showers instead of baths. Ensure the water temperature is not too high on the home water heater. Do not go swimming alone. When caring for infants or small  children, sit down when holding, feeding, or changing them to minimize risk of injury to the child in the event you have a seizure.   Follow with Primary MD   Sheral Flow, NP  and Dr Tomi Likens in 1-2 weeks  Please get a complete blood count and chemistry panel checked by your Primary MD at your next visit, and again as instructed by your Primary MD.  Get Medicines reviewed and adjusted: Please take all your medications with you for your next visit with your Primary MD  Laboratory/radiological data: Please request your Primary MD to go over all hospital tests and procedure/radiological results at the follow up, please ask your Primary MD to get all Hospital records sent to his/her office.  In some cases, they will be blood work, cultures and biopsy results pending at the time of your discharge. Please request that your primary care M.D. follows up on these results.  Also Note the following: If you experience worsening of your admission symptoms, develop shortness of breath, life threatening emergency, suicidal or homicidal thoughts you must seek medical attention immediately by calling 911 or calling your MD immediately  if symptoms less severe.  You must read complete instructions/literature along with all the possible adverse reactions/side effects for all the Medicines you take and that have been prescribed to you. Take any new Medicines after you have completely understood and accpet all the possible adverse reactions/side effects.   Do not drive when taking Pain medications or sleeping medications (Benzodaizepines)  Do not take more than prescribed Pain, Sleep and Anxiety Medications. It is not advisable to combine anxiety,sleep and pain medications without talking with your primary care practitioner  Special Instructions: If you have smoked or chewed Tobacco  in the last 2 yrs please stop smoking, stop any regular Alcohol  and or any Recreational drug use.  Wear Seat belts while driving.  Please note: You were cared for by a hospitalist during your hospital stay. Once you are discharged, your primary care physician will handle any  further medical issues. Please note that NO REFILLS for any discharge medications will be authorized once you are discharged, as it is imperative that you return to your primary care physician (or establish a relationship with a primary care physician if you do not have one) for your post hospital discharge needs so that they can reassess your need for medications and monitor your lab values.   Increase activity slowly    Complete by:  As directed      Allergies as of 06/20/2016      Reactions   Levaquin [levofloxacin In D5w] Nausea And Vomiting   Penicillins Hives   Childhood allergic reaction per spouse - no other information available 01/18/16      Medication List    TAKE these medications   acetaminophen 500 MG tablet Commonly known as:  TYLENOL Take 500 mg by mouth every 6 (six) hours as needed for headache (pain).   albuterol (2.5 MG/3ML) 0.083% nebulizer solution Commonly known as:  PROVENTIL Take 3 mLs (2.5 mg total) by nebulization 2 (two) times daily.   aspirin 81 MG EC tablet Take 1 tablet (81 mg total) by mouth daily.   carbidopa-levodopa 25-100 MG tablet Commonly known as:  SINEMET IR Take 1.5 tablets by mouth 3 (three) times daily.   clopidogrel 75 MG tablet Commonly known as:  PLAVIX Take 1 tablet (75 mg total) by mouth daily.   imiquimod 5 % cream Commonly known as:  [employer]  1 application topically 2 (two) times daily.   levETIRAcetam 500 MG tablet Commonly known as:  KEPPRA Take 1 tablet (500 mg total) by mouth 2 (two) times daily.   losartan 25 MG tablet Commonly known as:  COZAAR Take 1 tablet (25 mg total) by mouth daily.   metoprolol succinate 50 MG 24 hr tablet Commonly known as:  TOPROL-XL Take 1 tablet (50 mg total) by mouth daily. Take with or immediately following a meal.   nitroGLYCERIN 0.4 MG SL tablet Commonly known as:  NITROSTAT Place 1 tablet (0.4 mg total) under the tongue every 5 (five) minutes x 3 doses as needed for chest  pain.   ondansetron 4 MG disintegrating tablet Commonly known as:  ZOFRAN ODT Take 1 tablet (4 mg total) by mouth every 8 (eight) hours as needed for nausea or vomiting.   rosuvastatin 10 MG tablet Commonly known as:  CRESTOR   sertraline 50 MG tablet Commonly known as:  ZOLOFT Take 1 tablet (50 mg total) by mouth daily.      Follow-up Information    Sheral Flow, NP. Schedule an appointment as soon as possible for a visit in 1 week(s).   Specialty:  Internal Medicine Contact information: [place] [address] [place], [place]. Schedule an appointment as soon as possible for a visit in 1 week(s).   Specialty:  Neurology Contact information: [address] [place] [address] [phone]          Allergies  Allergen Reactions  . Levaquin [Levofloxacin In D5w] Nausea And Vomiting  . Penicillins Hives    Childhood allergic reaction per spouse - no other information available 01/18/16    Consultations:   cardiology and neurology  Other Procedures/Studies: Dg Chest 2 View  Result Date: 06/19/2016 CLINICAL DATA:  Left-sided chest pain for 2 days EXAM: CHEST  2 VIEW COMPARISON:  02/04/2016 FINDINGS: The heart size and mediastinal contours are within normal limits. Both lungs are clear. The visualized skeletal structures are unremarkable. IMPRESSION: No active cardiopulmonary disease. Electronically Signed   By: Kathreen Devoid   On: 06/19/2016 11:18   Ct Head Code Stroke Wo Contrast  Result Date: 06/20/2016 CLINICAL DATA:  Code stroke. Initial evaluation for left-sided facial droop. EXAM: CT HEAD WITHOUT CONTRAST TECHNIQUE: Contiguous axial images were obtained from the base of the skull through the vertex without intravenous contrast. COMPARISON:  Prior CT from 12/15/2015. FINDINGS: Brain: Generalized age-related cerebral atrophy with mild chronic small vessel ischemic disease. Possible small remote  left occipital infarct noted. No evidence for acute intracranial hemorrhage. No findings to suggest acute large vessel territory infarct. No mass lesion, midline shift, or mass effect. No hydrocephalus. No extra-axial fluid collection. Vascular: No hyperdense vessel. Scattered vascular calcifications present within the carotid siphons. Skull: Scalp soft tissues demonstrate no acute abnormality. Calvarium intact. Sinuses/Orbits: Globes and orbital soft tissues within normal limits. Chronic opacification of the right ethmoidal air cells noted. Paranasal sinuses are otherwise clear. No mastoid effusion. Other: None ASPECTS ([place] Stroke Program Early CT Score) - Ganglionic level infarction (caudate, lentiform nuclei, internal capsule, insula, M1-M3 cortex): 7 - Supraganglionic infarction (M4-M6 cortex): 3 Total score (0-10 with 10 being normal): 10 IMPRESSION: 1. No acute intracranial infarct or other process identified. 2. ASPECTS is 10 3. Mild age-related cerebral atrophy with chronic small vessel ischemic disease. Critical Value/emergent results were called by telephone at the time of interpretation  on 06/20/2016 at 4:59 am to Dr. Cheral Marker , who verbally acknowledged these results. Electronically Signed   By: Jeannine Boga M.D.   On: 06/20/2016 05:01      TODAY-DAY OF DISCHARGE:  Subjective:   [name] today has no headache,no chest abdominal pain,no new weakness tingling or numbness, feels much better wants to go home today.   Objective:   Blood pressure 136/74, pulse 60, temperature 98.2 F (36.8 C), temperature source Oral, resp. rate (!) 22, height 5\' 9"  (1.753 m), weight 64.9 kg (143 lb 1.6 oz), SpO2 97 %.  Intake/Output Summary (Last 24 hours) at 06/20/16 1546 Last data filed at 06/20/16 1100  Gross per 24 hour  Intake                0 ml  Output              275 ml  Net             -275 ml   Filed Weights   06/19/16 1048 06/19/16 1341  Weight: 67.1 kg (148 lb) 64.9 kg (143  lb 1.6 oz)    Exam: Awake Alert, Oriented *3, No new F.N deficits, Normal affect [place].AT,PERRAL Supple Neck,No JVD, No cervical lymphadenopathy appriciated.  Symmetrical Chest wall movement, Good air movement bilaterally, CTAB RRR,No Gallops,Rubs or new Murmurs, No Parasternal Heave +ve B.Sounds, Abd Soft, Non tender, No organomegaly appriciated, No rebound -guarding or rigidity. No Cyanosis, Clubbing or edema, No new Rash or bruise   PERTINENT RADIOLOGIC STUDIES: Dg Chest 2 View  Result Date: 06/19/2016 CLINICAL DATA:  Left-sided chest pain for 2 days EXAM: CHEST  2 VIEW COMPARISON:  02/04/2016 FINDINGS: The heart size and mediastinal contours are within normal limits. Both lungs are clear. The visualized skeletal structures are unremarkable. IMPRESSION: No active cardiopulmonary disease. Electronically Signed   By: Kathreen Devoid   On: 06/19/2016 11:18   Ct Head Code Stroke Wo Contrast  Result Date: 06/20/2016 CLINICAL DATA:  Code stroke. Initial evaluation for left-sided facial droop. EXAM: CT HEAD WITHOUT CONTRAST TECHNIQUE: Contiguous axial images were obtained from the base of the skull through the vertex without intravenous contrast. COMPARISON:  Prior CT from 12/15/2015. FINDINGS: Brain: Generalized age-related cerebral atrophy with mild chronic small vessel ischemic disease. Possible small remote left occipital infarct noted. No evidence for acute intracranial hemorrhage. No findings to suggest acute large vessel territory infarct. No mass lesion, midline shift, or mass effect. No hydrocephalus. No extra-axial fluid collection. Vascular: No hyperdense vessel. Scattered vascular calcifications present within the carotid siphons. Skull: Scalp soft tissues demonstrate no acute abnormality. Calvarium intact. Sinuses/Orbits: Globes and orbital soft tissues within normal limits. Chronic opacification of the right ethmoidal air cells noted. Paranasal sinuses are otherwise clear. No mastoid  effusion. Other: None ASPECTS ([place] Stroke Program Early CT Score) - Ganglionic level infarction (caudate, lentiform nuclei, internal capsule, insula, M1-M3 cortex): 7 - Supraganglionic infarction (M4-M6 cortex): 3 Total score (0-10 with 10 being normal): 10 IMPRESSION: 1. No acute intracranial infarct or other process identified. 2. ASPECTS is 10 3. Mild age-related cerebral atrophy with chronic small vessel ischemic disease. Critical Value/emergent results were called by telephone at the time of interpretation on 06/20/2016 at 4:59 am to Dr. Cheral Marker , who verbally acknowledged these results. Electronically Signed   By: Jeannine Boga M.D.   On: 06/20/2016 05:01     PERTINENT LAB RESULTS: CBC:  Recent Labs  06/19/16 1103 06/20/16 0504  WBC 8.7 7.6  HGB  15.8 13.7  HCT 47.3 42.2  PLT 219 209   CMET CMP     Component Value Date/Time   NA 139 06/20/2016 0504   NA 143 05/17/2014 0456   K 3.3 (L) 06/20/2016 0504   K 3.7 05/17/2014 0456   CL 103 06/20/2016 0504   CL 108 (H) 05/17/2014 0456   CO2 29 06/20/2016 0504   CO2 27 05/17/2014 0456   GLUCOSE 104 (H) 06/20/2016 0504   GLUCOSE 158 (H) 05/17/2014 0456   BUN 10 06/20/2016 0504   BUN 7 05/17/2014 0456   CREATININE 0.81 06/20/2016 0504   CREATININE 0.69 (L) 04/06/2016 0905   CALCIUM 8.5 (L) 06/20/2016 0504   CALCIUM 8.2 (L) 05/17/2014 0456   PROT 6.0 (L) 06/20/2016 0504   PROT 6.6 05/13/2014 1251   ALBUMIN 3.1 (L) 06/20/2016 0504   ALBUMIN 3.3 (L) 05/13/2014 1251   AST 17 06/20/2016 0504   AST 27 05/13/2014 1251   ALT 5 (L) 06/20/2016 0504   ALT 19 05/13/2014 1251   ALKPHOS 78 06/20/2016 0504   ALKPHOS 115 05/13/2014 1251   BILITOT 0.9 06/20/2016 0504   BILITOT 1.1 (H) 05/13/2014 1251   GFRNONAA >60 06/20/2016 0504   GFRNONAA >60 05/17/2014 0456   GFRNONAA >60 06/20/2013 1252   GFRAA >60 06/20/2016 0504   GFRAA >60 05/17/2014 0456   GFRAA >60 06/20/2013 1252    GFR Estimated Creatinine Clearance: 73.4  mL/min (by C-G formula based on SCr of 0.81 mg/dL). No results for input(s): LIPASE, AMYLASE in the last 72 hours.  Recent Labs  06/19/16 1829 06/20/16 0009 06/20/16 0504  TROPONINI <0.03 <0.03 <0.03   Invalid input(s): POCBNP No results for input(s): DDIMER in the last 72 hours.  Recent Labs  06/19/16 1103  HGBA1C 5.3    Recent Labs  06/20/16 0504  CHOL 185  HDL 30*  LDLCALC 116*  TRIG 195*  CHOLHDL 6.2   No results for input(s): TSH, T4TOTAL, T3FREE, THYROIDAB in the last 72 hours.  Invalid input(s): FREET3 No results for input(s): VITAMINB12, FOLATE, FERRITIN, TIBC, IRON, RETICCTPCT in the last 72 hours. Coags:  Recent Labs  06/19/16 1103  INR 0.97   Microbiology: No results found for this or any previous visit (from the past 240 hour(s)).  FURTHER DISCHARGE INSTRUCTIONS:  Get Medicines reviewed and adjusted: Please take all your medications with you for your next visit with your Primary MD  Laboratory/radiological data: Please request your Primary MD to go over all hospital tests and procedure/radiological results at the follow up, please ask your Primary MD to get all Hospital records sent to his/her office.  In some cases, they will be blood work, cultures and biopsy results pending at the time of your discharge. Please request that your primary care M.D. goes through all the records of your hospital data and follows up on these results.  Also Note the following: If you experience worsening of your admission symptoms, develop shortness of breath, life threatening emergency, suicidal or homicidal thoughts you must seek medical attention immediately by calling 911 or calling your MD immediately  if symptoms less severe.  You must read complete instructions/literature along with all the possible adverse reactions/side effects for all the Medicines you take and that have been prescribed to you. Take any new Medicines after you have completely understood and  accpet all the possible adverse reactions/side effects.   Do not drive when taking Pain medications or sleeping medications (Benzodaizepines)  Do not take more than prescribed  Pain, Sleep and Anxiety Medications. It is not advisable to combine anxiety,sleep and pain medications without talking with your primary care practitioner  Special Instructions: If you have smoked or chewed Tobacco  in the last 2 yrs please stop smoking, stop any regular Alcohol  and or any Recreational drug use.  Wear Seat belts while driving.  Please note: You were cared for by a hospitalist during your hospital stay. Once you are discharged, your primary care physician will handle any further medical issues. Please note that NO REFILLS for any discharge medications will be authorized once you are discharged, as it is imperative that you return to your primary care physician (or establish a relationship with a primary care physician if you do not have one) for your post hospital discharge needs so that they can reassess your need for medications and monitor your lab values.  Total Time spent coordinating discharge including counseling, education and face to face time equals 35 minutes.  SignedOren Binet 06/20/2016 3:46 PM

## 2016-06-20 NOTE — Progress Notes (Addendum)
Spoke with Dr. Caryl Comes concerning patient discharge.  Dr. Caryl Comes OK from cardiac standpoint for discharge if neurology feels OK. Patient and wife feel OK and state that he was taking seizure medication for over 20 years when his doctor changed to cymbalta. Dr. Carles Collet discontinued cymbalta and he was not taking anything. Patient wants to go home and take care his dogs. Pt resting with call bell within reach.  Will continue to monitor.  Payton Emerald, RN

## 2016-06-20 NOTE — Significant Event (Signed)
Rapid Response Event Note  Overview: Time Called: 8413 Arrival Time: 0359 Event Type: Neurologic, Respiratory  Initial Focused Assessment: Called by Jay Penny, RN for pt who was LSN at [phone].  At 0350 pt c/o chest pain and when his nurse went to check vitals and give him NTG at 0355  he had sudden onset unresponsiveness and respiratory arrest lasting less than 1 minute.  Rescue breathing with ambu bag was given for a few minutes until spontaneous respirations occurred.Pt never lost a pulse. No seizure activity noted, no incontinence of bladder or bowel Upon my arrival at bedside pt responds with moaning only to vigorous sternal rub.  Extremities x 4 flaccid, left facial droop noted and after about 5 minutes pt was able to state name but speech was slurred.  He did not follow commands. PERL 94mm.  Interventions: NRB mask  15 l with 02 sats 100% and  RR 20  12lead EKG: NSR rate 61 Possible anterior infarct NIHSS  Score 31.  Spoke to [name], Neurologist regarding pt's symptoms at [phone],  code stroke was activated at  0418 and pt arrived CT scan at 0427. No acute findings on CT, code stroke was cancelled at 0446 and pt was returned to his room [id number].  Jay [name], Hospitalist  arrived at bedside at 0500.  Pt's wife was informed of change is status by Jay Penny, RN and came to bedside at [place].  After wife's  discussion with [name] it was decided that pt probably experienced  a seizure.By 2536 pt was alert, oriented MAE.   0530 VS:  136/74  60 SR  22  97% on 2 l [place]   Pt passed stroke swallow screen. Hand off report given to Donna,RN to perform neuro checks q 4h and  call for any changes in neuro status.           Plan of Care (if not transferred):  Event Summary: Name of Physician Notified: [name] at [phone]  Name of Consulting Physician Notified: [name] at [phone]        [name]

## 2016-06-20 NOTE — Progress Notes (Signed)
NT took this patient to the BR without incident around 0323. Patient returned to bed only to tell the NT that he was having chest pain. NT informed this RN that patient was having chest pain. This RN went to patient's room to assess the patient and asked where the pain was.Pt denies any radiation to other areas of the body. RN returned to room with nitro and dinamap to take vitals. Patient c/o chest pain to left of sternum. Patient stated that, "it hurt 8 (0-10). Pt also stated that,"the pain was in his upper left arm, a throbbing pain". While the vitals were being taken, the patient's speech seemed more slurred from baseline and then went unresponsive, had a blank stare and stopped breathing but with positive pulses. Staff was unable to arouse patient and code blue was called, vitals taken and stable, pads applied. Rapid response at bedside did neuro checks demonstrating that all four of patient's extremities went limp. Patient was unable to answer questions. Patient could only nod his head "no".   Cardiology was notified by this RN at [phone]. Rapid response notified neurology, called "code stroke" at [place]. Patient rushed to CT to rule out stroke. Family notified of change in patient's status.

## 2016-06-20 NOTE — Progress Notes (Signed)
Informed by RN-that patient and spouse requesting d/c today. Cleared by cards for discharge (see RN note earlier today). I have spoken with Dr Kateri Mc on call-who has reviewed the patient's chart (seen by Dr Max Fickle earlier this am)-he is OK to d/c patient home today-advises we keep patient on Keppra-till seen by outpatient Neuro. Patient's wife will call Dr Georgie Chard office on 3/12 to make appointment. See d/c summary for details.

## 2016-06-20 NOTE — Progress Notes (Signed)
Patient Name: [name]      SUBJECTIVE: Events of the early a.m. are noted. Described as sudden unresponsiveness and apnea. He was treated with rescue breathing. ECG telemetry was reviewed personally and did not change. Rapid response nurse "never lost a pulse "blood pressures are recorded in epic only infrequently; those are both normal on either side of the event.  Has a history of "convulsive syncope" Or had been discontinued. He has been resumed by neurology.  He had been admitted chest pain. Troponins were negative. Previously false negative Myoview. Plan had been catheterization in the morning.  Past Medical History:  Diagnosis Date  . Chest pain   . COPD (chronic obstructive pulmonary disease) ([place])   . Depression   . Diabetes ([place])   . Dysphagia   . Essential tremor   . Heart disease   . Hyperlipidemia   . Hypertension   . Seizure disorder ([place])   . Tardive dyskinesia   . Vitamin D deficiency     Scheduled Meds:  Scheduled Meds: . albuterol  2.5 mg Nebulization BID  . aspirin EC  81 mg Oral Daily  . carbidopa-levodopa  1.5 tablet Oral TID  . clopidogrel  75 mg Oral Daily  . heparin  5,000 Units Subcutaneous Q8H  . levETIRAcetam  500 mg Oral BID  . LORazepam  1 mg Intravenous Once  . losartan  25 mg Oral Daily  . metoprolol succinate  50 mg Oral Daily  . pantoprazole  40 mg Oral Daily  . potassium chloride  40 mEq Oral Once  . rosuvastatin  20 mg Oral q1800  . sertraline  50 mg Oral Daily   Continuous Infusions: acetaminophen, gi cocktail, nitroGLYCERIN, ondansetron (ZOFRAN) IV    PHYSICAL EXAM Vitals:   06/20/16 0415 06/20/16 0416 06/20/16 0536 06/20/16 0803  BP: (!) 164/73  136/74   Pulse: 72  60   Resp: 20  (!) 22   Temp: 97.4 F (36.3 C)  98.2 F (36.8 C)   TempSrc: Axillary  Oral   SpO2: 95% 100% 97% 97%  Weight:      Height:       Well developed and nourished in no acute distress HENT normal Neck supple with  JVP-flat Clear Regular rate and rhythm, no murmurs or gallops Abd-soft with active BS No Clubbing cyanosis edema Skin-warm and dry A & Oriented (surprisingly)  Grossly normal sensory and motor function   TELEMETRY: Reviewed personnally pt in Normal sinus rhythm with occasional nonsustained atrial tachycardia and frequent PVCs:     No intake or output data in the 24 hours ending 06/20/16 1118  LABS: Basic Metabolic Panel: Personally reviewed    Recent Labs Lab 06/19/16 1103 06/20/16 0504  NA 140 139  K 3.4* 3.3*  CL 103 103  CO2 27 29  GLUCOSE 125* 104*  BUN 10 10  CREATININE 0.73 0.81  CALCIUM 9.0 8.5*  MG 2.0  --   PHOS  --  3.9   Cardiac Enzymes:  Recent Labs  06/19/16 1829 06/20/16 0009 06/20/16 0504  TROPONINI <0.03 <0.03 <0.03   CBC:  Recent Labs Lab 06/19/16 1103 06/20/16 0504  WBC 8.7 7.6  NEUTROABS  --  4.0  HGB 15.8 13.7  HCT 47.3 42.2  MCV 84.6 83.9  PLT 219 209   PROTIME:  Recent Labs  06/19/16 1103  LABPROT 12.9  INR 0.97   Liver Function Tests:  Recent Labs  06/19/16 1103 06/20/16 0504  AST 19 17  ALT 12* 5*  ALKPHOS 93 78  BILITOT 1.1 0.9  PROT 6.9 6.0*  ALBUMIN 3.8 3.1*   No results for input(s): LIPASE, AMYLASE in the last 72 hours. BNP: BNP (last 3 results)  Recent Labs  12/14/15 1105  BNP 15.6    ProBNP (last 3 results) No results for input(s): PROBNP in the last 8760 hours.  D-Dimer: No results for input(s): DDIMER in the last 72 hours. Hemoglobin A1C:  Recent Labs  06/19/16 1103  HGBA1C 5.3   Fasting Lipid Panel:  Recent Labs  06/20/16 0504  CHOL 185  HDL 30*  LDLCALC 116*  TRIG 195*  CHOLHDL 6.2       ASSESSMENT AND PLAN:  Principal Problem:   Chest pain Active Problems:   Hyperlipidemia   COPD (chronic obstructive pulmonary disease) (HCC)   Seizure disorder (HCC)   Parkinson's variant of multiple system atrophy (HCC)   Status post coronary artery stent placement   CAD  (coronary artery disease)   Hypokalemia  The records were reviewed as to the event last night. There is no data to suggest that this was a cardiovascular event. Since, I suspect it is neurological and appreciate their input.  LDL is elevated. We need more aggressive antilipid therapy  We will postpone catheterization. Think that this is low yield anyway in light of the negative troponins Replete potassium- ordered   Signed, Virl Axe MD  06/20/2016

## 2016-06-22 ENCOUNTER — Telehealth: Payer: Self-pay | Admitting: *Deleted

## 2016-06-22 NOTE — Telephone Encounter (Deleted)
PATIENT DETAILS Name: [name] Age: 76 y.o. Sex: male Date of Birth: 03-03-1941 MRN: [id number]. Admitting Physician: Maren Reamer, MD [id number] Lorre Nick, NP  Admit Date: 06/19/2016 Discharge date: 06/20/2016  Recommendations for Outpatient Follow-up:  1. Follow up with PCP in 1-2 weeks 2. Please obtain BMP/CBC in one week 3. Please ensure outpatient follow-up with cardiology and neurology.  Admitted From:  Home  Disposition: [place]: No  Equipment/Devices: None  Discharge Condition: Stable  CODE STATUS: FULL CODE  Diet recommendation:  Heart Healthy  Transition Care Management Follow-up Telephone Call    How have you been since you were released from the hospital? ***   Do you understand why you were in the hospital? {YES/NO/WILD CARDS:18581}   Do you understand the discharge instructions? {YES/NO/WILD IDHWY:61683}   Where were you discharged to? {Home/Rest Home/Assisted Living Facility}   Items Reviewed:  Medications reviewed: {YES/NO/WILD FGBMS:11155}  Allergies reviewed: {YES/NO/WILD MCEYE:23361}  Dietary changes reviewed: {YES/NO/WILD QAESL:75300}  Referrals reviewed: {YES/NO/WILD FRTMY:11173}   Functional Questionnaire:   Activities of Daily Living (ADLs):   He states they are independent in the following: {adls:17999} States they require assistance with the following: {adls:17999}   Any transportation issues/concerns?: {YES/NO/WILD CARDS:18581}   Any patient concerns? {YES/NO/WILD VAPOL:41030}   Confirmed importance and date/time of follow-up visits scheduled {YES/NO/WILD DTHYH:88875}  Provider Appointment booked with  Confirmed with patient if condition begins to worsen call PCP or go to the ER.  Patient was given the office number and encouraged to call back with question or concerns.  : {YES/NO/WILD ZVJKQ:20601}

## 2016-06-22 NOTE — Telephone Encounter (Signed)
Called patient and left message to return call

## 2016-06-23 NOTE — Telephone Encounter (Signed)
PATIENT DETAILS Name: Kollyn Lingafelter Age: 76 y.o. Sex: male Date of Birth: August 09, 1940 MRN: [id number]. Admitting Physician: Maren Reamer, MD [id number] Lorre Nick, NP  Admit Date: 06/19/2016 Discharge date: 06/20/2016  Recommendations for Outpatient Follow-up:  1. Follow up with PCP in 1-2 weeks 2. Please obtain BMP/CBC in one week 3. Please ensure outpatient follow-up with cardiology and neurology.  Admitted From:  Home  Disposition: [place]: No  Equipment/Devices: None  Discharge Condition: Stable  CODE STATUS: FULL CODE  Diet recommendation:  Heart Healthy  Transition Care Management Follow-up Telephone Call: Done with wife Katharine Look.    How have you been since you were released from the hospital? A whole lot better.   Do you understand why you were in the hospital? yes   Do you understand the discharge instructions? yes   Where were you discharged to? Home with wife.   Items Reviewed:  Medications reviewed: yes  Allergies reviewed: yes  Dietary changes reviewed: yes  Referrals reviewed: yes   Functional Questionnaire:   Activities of Daily Living (ADLs):   He states they are independent in the following: ambulation, bathing and hygiene, continence, grooming, toileting and dressing States they require assistance with the following: feeding   Any transportation issues/concerns?: no   Any patient concerns? no   Confirmed importance and date/time of follow-up visits scheduled yes Provider Appointment booked with Alma Friendly NP 06/29/16 @930 .  Confirmed with patient if condition begins to worsen call PCP or go to the ER.  Patient was given the office number and encouraged to call back with question or concerns.  : yes

## 2016-06-25 ENCOUNTER — Telehealth: Payer: Self-pay | Admitting: Neurology

## 2016-06-25 NOTE — Telephone Encounter (Signed)
PT called in regards to his prescription Keppra/Dawn CB# [phone]

## 2016-06-25 NOTE — Telephone Encounter (Signed)
Spouse states she lost Keppra Rx [name] gave her. Advised showing per last OV note Keppra d/c(d). Pt went ED to Admission since last OV. Was rx'd Keppra during stay. Advised spouse per discharge summary note discharge meds should be managed by PCP.  Spouse verbalized understanding

## 2016-06-29 ENCOUNTER — Ambulatory Visit: Payer: PPO | Admitting: Primary Care

## 2016-07-02 ENCOUNTER — Ambulatory Visit: Payer: PPO | Admitting: Primary Care

## 2016-07-02 ENCOUNTER — Telehealth: Payer: Self-pay | Admitting: Primary Care

## 2016-07-02 NOTE — Telephone Encounter (Signed)
Please reschedule at his convenience. Do not charge fee.

## 2016-07-02 NOTE — Telephone Encounter (Signed)
Patient did not come in for their appointment today for TCM visit  Please let me know if patient needs to be contacted immediately for follow up or no follow up needed. Do you want to charge the NSF?

## 2016-07-06 ENCOUNTER — Ambulatory Visit (INDEPENDENT_AMBULATORY_CARE_PROVIDER_SITE_OTHER): Payer: PPO | Admitting: Cardiovascular Disease

## 2016-07-06 ENCOUNTER — Encounter: Payer: Self-pay | Admitting: Cardiovascular Disease

## 2016-07-06 VITALS — BP 156/82 | HR 70 | Ht 69.0 in | Wt 150.0 lb

## 2016-07-06 DIAGNOSIS — I25118 Atherosclerotic heart disease of native coronary artery with other forms of angina pectoris: Secondary | ICD-10-CM | POA: Diagnosis not present

## 2016-07-06 DIAGNOSIS — I739 Peripheral vascular disease, unspecified: Secondary | ICD-10-CM | POA: Diagnosis not present

## 2016-07-06 DIAGNOSIS — E785 Hyperlipidemia, unspecified: Secondary | ICD-10-CM | POA: Diagnosis not present

## 2016-07-06 NOTE — Patient Instructions (Signed)
Medication Instructions:  Your physician recommends that you continue on your current medications as directed. Please refer to the Current Medication list given to you today.  Follow-Up: Your physician wants you to follow-up in: 6 MONTHS with Dr. Arida. You will receive a reminder letter in the mail two months in advance. If you don't receive a letter, please call our office to schedule the follow-up appointment.   Any Other Special Instructions Will Be Listed Below (If Applicable).     If you need a refill on your cardiac medications before your next appointment, please call your pharmacy.   

## 2016-07-06 NOTE — Telephone Encounter (Signed)
Can you remove the no show fee for this patient per the message below?  Thanks

## 2016-07-06 NOTE — Progress Notes (Signed)
Cardiology Office Note   Date:  07/06/2016   ID:  Chaze, Hruska Oct 01, 1940, MRN [id number]  PCP:  Sheral Flow, NP  Cardiologist:  Dr. Meda Coffee  Chief Complaint  Patient presents with  . PV Consult      History of Present Illness: [name] is a 76 y.o. male who was referred by Dr. Meda Coffee for evaluation and management of peripheral arterial disease. He has known history of coronary artery disease status post stenting of OM1 in October 2017, COPD with prior tobacco use, hyperlipidemia, hypertension, Parkinson's disease with cognitive decline and recent possible seizure. He was hospitalized recently at [facility] for atypical chest pain and ruled out for myocardial infarction. He had an episode of unresponsiveness while hospitalized without arrhythmia which was thought to be neurologic in nature. The patient reports prolonged history of bilateral calf pain with walking which started about 2 years ago and has been progressive. The pain happens at short distance and equal in both legs. He has no open wounds or ulcers. His functional capacity is very limited by his comorbid conditions and he walks slowly with a cane due to poor balance. He underwent noninvasive vascular evaluation which showed moderately reduced ABI bilaterally at the 0.6 range with evidence of SFA disease on both sides.    Past Medical History:  Diagnosis Date  . Chest pain   . COPD (chronic obstructive pulmonary disease) ([place])   . Depression   . Diabetes ([place])   . Dysphagia   . Essential tremor   . Heart disease   . Hyperlipidemia   . Hypertension   . Seizure disorder ([place])   . Tardive dyskinesia   . Vitamin D deficiency     Past Surgical History:  Procedure Laterality Date  . CARDIAC CATHETERIZATION N/A 01/19/2016   Procedure: Left Heart Cath and Coronary Angiography;  Surgeon: Jolaine Artist, MD;  Location: [place] [facility];  Service: Cardiovascular;  Laterality: N/A;  .  CARDIAC CATHETERIZATION  01/19/2016   Procedure: Coronary Stent Intervention;  Surgeon: Jettie Booze, MD;  Location: [place] [facility];  Service: Cardiovascular;;  . INGUINAL HERNIA REPAIR Left as child     Current Outpatient Prescriptions  Medication Sig Dispense Refill  . acetaminophen (TYLENOL) 500 MG tablet Take 500 mg by mouth every 6 (six) hours as needed for headache (pain).    Marland Kitchen albuterol (PROVENTIL) (2.5 MG/3ML) 0.083% nebulizer solution Take 3 mLs (2.5 mg total) by nebulization 2 (two) times daily. 150 mL 1  . aspirin EC 81 MG EC tablet Take 1 tablet (81 mg total) by mouth daily. 30 tablet 0  . carbidopa-levodopa (SINEMET IR) 25-100 MG tablet Take 1.5 tablets by mouth 3 (three) times daily. 150 tablet 2  . clopidogrel (PLAVIX) 75 MG tablet Take 1 tablet (75 mg total) by mouth daily. 30 tablet 11  . imiquimod (ALDARA) 5 % cream Apply 1 application topically 2 (two) times daily.    Marland Kitchen losartan (COZAAR) 25 MG tablet Take 1 tablet (25 mg total) by mouth daily. 30 tablet 6  . metoprolol succinate (TOPROL-XL) 50 MG 24 hr tablet Take 1 tablet (50 mg total) by mouth daily. Take with or immediately following a meal. 30 tablet 6  . nitroGLYCERIN (NITROSTAT) 0.4 MG SL tablet Place 1 tablet (0.4 mg total) under the tongue every 5 (five) minutes x 3 doses as needed for chest pain. 25 tablet 3  . ondansetron (ZOFRAN ODT) 4 MG disintegrating tablet Take 1 tablet (  4 mg total) by mouth every 8 (eight) hours as needed for nausea or vomiting. 20 tablet 0  . rosuvastatin (CRESTOR) 10 MG tablet     . sertraline (ZOLOFT) 50 MG tablet Take 1 tablet (50 mg total) by mouth daily. 90 tablet 1   No current facility-administered medications for this visit.     Allergies:   Levaquin [levofloxacin in d5w] and Penicillins    Social History:  The patient  reports that he quit smoking about 11 years ago. He has never used smokeless tobacco. He reports that he does not drink alcohol or use drugs.   Family  History:  The patient's family history includes CAD in his brother; Depression in his mother; Emphysema in his father; Heart attack in his brother; Lung cancer in his father.    ROS:  Please see the history of present illness.   Otherwise, review of systems are positive for none.   All other systems are reviewed and negative.    PHYSICAL EXAM: VS:  BP (!) 156/82   Pulse 70   Ht 5\' 9"  (1.753 m)   Wt 150 lb (68 kg)   BMI 22.15 kg/m  , BMI Body mass index is 22.15 kg/m. GEN: Well nourished, well developed, in no acute distress  HEENT: normal  Neck: no JVD, carotid bruits, or masses Cardiac: RRR; no murmurs, rubs, or gallops,no edema  Respiratory:  clear to auscultation bilaterally, normal work of breathing GI: soft, nontender, nondistended, + BS MS: no deformity or atrophy  Skin: warm and dry, no rash Neuro:  Strength and sensation are intact Psych: euthymic mood, full affect Vascular: Femoral pulses are normal bilaterally. Distal pulses are not palpable.   EKG:  EKG is not ordered today.    Recent Labs: 12/14/2015: B Natriuretic Peptide 15.6 04/06/2016: TSH 1.19 06/19/2016: Magnesium 2.0 06/20/2016: ALT 5; BUN 10; Creatinine, Ser 0.81; Hemoglobin 13.7; Platelets 209; Potassium 3.3; Sodium 139    Lipid Panel    Component Value Date/Time   CHOL 185 06/20/2016 0504   TRIG 195 (H) 06/20/2016 0504   HDL 30 (L) 06/20/2016 0504   CHOLHDL 6.2 06/20/2016 0504   VLDL 39 06/20/2016 0504   LDLCALC 116 (H) 06/20/2016 0504      Wt Readings from Last 3 Encounters:  07/06/16 150 lb (68 kg)  06/19/16 143 lb 1.6 oz (64.9 kg)  06/17/16 148 lb (67.1 kg)       PAD Screen 07/06/2016  Previous PAD dx? No  Previous surgical procedure? No  Pain with walking? Yes  Subsides with rest? No  Feet/toe relief with dangling? No  Painful, non-healing ulcers? No  Extremities discolored? No      ASSESSMENT AND PLAN:  1.  Peripheral arterial disease: Severe bilateral calf claudication due  to SFA disease on both sides. However, the patient's is also limited by multiple comorbidities and poor functional capacity related to poor balance and Parkinson's disease. I don't think revascularization will make a big difference and overall functional status. I discussed with him the importance of regular walking. Ideally, rehabilitation might be helpful. However, he was not able to attend cardiac rehabilitation after coronary revascularization due to transportation issues. He is already on optimal medical therapy for peripheral arterial disease. Follow-up with me in 6 months for reevaluation.  2. Coronary artery disease involving native coronary arteries with other forms of angina: Symptoms seems to be stable on medical therapy.  3. Hyperlipidemia: Continue treatment with rosuvastatin with a target LDL of less than  70.    Disposition:   FU with me in 6 months  Signed,  Kathlyn [place], MD  07/06/2016 9:16 AM    [place]

## 2016-08-14 ENCOUNTER — Emergency Department
Admission: EM | Admit: 2016-08-14 | Discharge: 2016-08-14 | Disposition: A | Discharge status: Home / Self Care | Payer: PPO | Attending: Emergency Medicine | Admitting: Emergency Medicine

## 2016-08-14 ENCOUNTER — Encounter: Payer: Self-pay | Admitting: Emergency Medicine

## 2016-08-14 ENCOUNTER — Emergency Department: Payer: PPO

## 2016-08-14 DIAGNOSIS — E119 Type 2 diabetes mellitus without complications: Secondary | ICD-10-CM | POA: Insufficient documentation

## 2016-08-14 DIAGNOSIS — R109 Unspecified abdominal pain: Secondary | ICD-10-CM

## 2016-08-14 DIAGNOSIS — Z87891 Personal history of nicotine dependence: Secondary | ICD-10-CM | POA: Insufficient documentation

## 2016-08-14 DIAGNOSIS — Z7982 Long term (current) use of aspirin: Secondary | ICD-10-CM | POA: Diagnosis not present

## 2016-08-14 DIAGNOSIS — Z79899 Other long term (current) drug therapy: Secondary | ICD-10-CM | POA: Diagnosis not present

## 2016-08-14 DIAGNOSIS — G20 Parkinson's disease: Secondary | ICD-10-CM | POA: Insufficient documentation

## 2016-08-14 DIAGNOSIS — I2510 Atherosclerotic heart disease of native coronary artery without angina pectoris: Secondary | ICD-10-CM | POA: Insufficient documentation

## 2016-08-14 DIAGNOSIS — N200 Calculus of kidney: Secondary | ICD-10-CM | POA: Diagnosis not present

## 2016-08-14 DIAGNOSIS — I10 Essential (primary) hypertension: Secondary | ICD-10-CM | POA: Insufficient documentation

## 2016-08-14 DIAGNOSIS — J449 Chronic obstructive pulmonary disease, unspecified: Secondary | ICD-10-CM | POA: Insufficient documentation

## 2016-08-14 DIAGNOSIS — R1031 Right lower quadrant pain: Secondary | ICD-10-CM | POA: Diagnosis not present

## 2016-08-14 LAB — CBC
HCT: 49.2 % (ref 40.0–52.0)
HEMOGLOBIN: 16.2 g/dL (ref 13.0–18.0)
MCH: 27.2 pg (ref 26.0–34.0)
MCHC: 33 g/dL (ref 32.0–36.0)
MCV: 82.5 fL (ref 80.0–100.0)
PLATELETS: 217 10*3/uL (ref 150–440)
RBC: 5.96 MIL/uL — AB (ref 4.40–5.90)
RDW: 14.6 % — ABNORMAL HIGH (ref 11.5–14.5)
WBC: 11.4 10*3/uL — AB (ref 3.8–10.6)

## 2016-08-14 LAB — COMPREHENSIVE METABOLIC PANEL
ANION GAP: 10 (ref 5–15)
AST: 25 U/L (ref 15–41)
Albumin: 4.1 g/dL (ref 3.5–5.0)
Alkaline Phosphatase: 96 U/L (ref 38–126)
BUN: 15 mg/dL (ref 6–20)
CO2: 27 mmol/L (ref 22–32)
CREATININE: 0.84 mg/dL (ref 0.61–1.24)
Calcium: 9.1 mg/dL (ref 8.9–10.3)
Chloride: 101 mmol/L (ref 101–111)
Glucose, Bld: 126 mg/dL — ABNORMAL HIGH (ref 65–99)
Potassium: 3.3 mmol/L — ABNORMAL LOW (ref 3.5–5.1)
SODIUM: 138 mmol/L (ref 135–145)
Total Bilirubin: 0.8 mg/dL (ref 0.3–1.2)
Total Protein: 7.7 g/dL (ref 6.5–8.1)

## 2016-08-14 LAB — LIPASE, BLOOD: Lipase: 21 U/L (ref 11–51)

## 2016-08-14 MED ORDER — SODIUM CHLORIDE 0.9 % IV BOLUS (SEPSIS)
1000.0000 mL | Freq: Once | INTRAVENOUS | Status: AC
Start: 2016-08-14 — End: 2016-08-14
  Administered 2016-08-14: 1000 mL via INTRAVENOUS

## 2016-08-14 MED ORDER — TAMSULOSIN HCL 0.4 MG PO CAPS: 0.4000 mg | ORAL_CAPSULE | Freq: Every day | ORAL | 0 refills | Status: DC

## 2016-08-14 MED ORDER — ONDANSETRON 4 MG PO TBDP: 4.0000 mg | ORAL_TABLET | Freq: Three times a day (TID) | ORAL | 0 refills | Status: DC | PRN

## 2016-08-14 MED ORDER — KETOROLAC TROMETHAMINE 30 MG/ML IJ SOLN
15.0000 mg | Freq: Once | INTRAMUSCULAR | Status: AC
  Administered 2016-08-14: 15 mg via INTRAVENOUS
  Filled 2016-08-14: qty 1

## 2016-08-14 MED ORDER — ONDANSETRON HCL 4 MG/2ML IJ SOLN
4.0000 mg | Freq: Once | INTRAMUSCULAR | Status: AC
  Administered 2016-08-14: 4 mg via INTRAVENOUS
  Filled 2016-08-14: qty 2

## 2016-08-14 MED ORDER — MORPHINE SULFATE (PF) 4 MG/ML IV SOLN
4.0000 mg | Freq: Once | INTRAVENOUS | Status: AC
  Administered 2016-08-14: 4 mg via INTRAVENOUS
  Filled 2016-08-14: qty 1

## 2016-08-14 MED ORDER — OXYCODONE-ACETAMINOPHEN 5-325 MG PO TABS: 1.0000 | ORAL_TABLET | Freq: Four times a day (QID) | ORAL | 0 refills | Status: DC | PRN

## 2016-08-14 NOTE — Discharge Instructions (Signed)
Please call the number provided for Dr. Erlene Quan to arrange a follow-up appointment as soon as possible. Please take your pain and nausea medication as needed, as prescribed. Return to the emergency department for any increased pain or fever.

## 2016-08-14 NOTE — ED Triage Notes (Signed)
Pt c/o abdominal pain under right rib cage and right flank pain. Tender to touch. Denies urinary sxs, nausea, vomiting or diarrhea. Denies fall. Pain started this morning. Pt c/o shortness of breath, but nothing different than usual.

## 2016-08-14 NOTE — ED Provider Notes (Signed)
[facility] Emergency Department Provider Note  Time seen: 2:10 PM  I have reviewed the triage vital signs and the nursing notes.   HISTORY  Chief Complaint Flank Pain and Abdominal Pain    HPI [name] is a 76 y.o. male with a past medical history of COPD, depression, diabetes, hypertension, hyperlipidemia, who presents to the emergency department with right flank pain. According to the patient beginning this morning he has had severe progressively worsening right flank pain with nausea and vomiting. Denies diarrhea. Denies dysuria or hematuria. Denies any history of kidney stones in the past. Describes the pain as severe sharp located in the right flank.  Past Medical History:  Diagnosis Date  . Chest pain   . COPD (chronic obstructive pulmonary disease) ([place])   . Depression   . Diabetes ([place])   . Dysphagia   . Essential tremor   . Heart disease   . Hyperlipidemia   . Hypertension   . Seizure disorder ([place])   . Tardive dyskinesia   . Vitamin D deficiency     Patient Active Problem List   Diagnosis Date Noted  . Hypokalemia 06/20/2016  . Syncope   . CAD (coronary artery disease) 06/19/2016  . Claudication ([place]) 06/17/2016  . Status post coronary artery stent placement   . SVT (supraventricular tachycardia) ([place])   . NSVT (nonsustained ventricular tachycardia) ([place])   . NSTEMI (non-ST elevated myocardial infarction) ([place])   . Parkinson's variant of multiple system atrophy ([place]) 12/19/2015  . Encephalopathy chronic 12/14/2015  . Chest pain   . COPD (chronic obstructive pulmonary disease) ([place])   . Heart disease   . Seizure disorder ([place])   . Depression   . Tardive dyskinesia   . Dysphagia   . Skin lesion 08/14/2015  . Shortness of breath 08/14/2015  . Essential hypertension 08/14/2015  . Hyperlipidemia 08/14/2015  . Essential tremor 08/14/2015    Past Surgical History:  Procedure Laterality Date  . CARDIAC CATHETERIZATION  N/A 01/19/2016   Procedure: Left Heart Cath and Coronary Angiography;  Surgeon: Jolaine Artist, MD;  Location: [facility];  Service: Cardiovascular;  Laterality: N/A;  . CARDIAC CATHETERIZATION  01/19/2016   Procedure: Coronary Stent Intervention;  Surgeon: Jettie Booze, MD;  Location: [facility];  Service: Cardiovascular;;  . INGUINAL HERNIA REPAIR Left as child    Prior to Admission medications   Medication Sig Start Date End Date Taking? Authorizing Provider  acetaminophen (TYLENOL) 500 MG tablet Take 500 mg by mouth every 6 (six) hours as needed for headache (pain).    [provider]  albuterol (PROVENTIL) (2.5 MG/3ML) 0.083% nebulizer solution Take 3 mLs (2.5 mg total) by nebulization 2 (two) times daily. 03/24/16   Pleas Koch, NP  aspirin EC 81 MG EC tablet Take 1 tablet (81 mg total) by mouth daily. 12/16/15   Regalado, Belkys A, MD  carbidopa-levodopa (SINEMET IR) 25-100 MG tablet Take 1.5 tablets by mouth 3 (three) times daily. 05/21/16   Pieter Partridge, DO  clopidogrel (PLAVIX) 75 MG tablet Take 1 tablet (75 mg total) by mouth daily. 04/14/16   Dorothy Spark, MD  imiquimod [place] Sea) 5 % cream Apply 1 application topically 2 (two) times daily. 01/12/16   [provider]  losartan (COZAAR) 25 MG tablet Take 1 tablet (25 mg total) by mouth daily. 01/21/16   Cheryln Manly, NP  metoprolol succinate (TOPROL-XL) 50 MG 24 hr tablet Take 1 tablet (50  mg total) by mouth daily. Take with or immediately following a meal. 01/21/16   Cheryln Manly, NP  nitroGLYCERIN (NITROSTAT) 0.4 MG SL tablet Place 1 tablet (0.4 mg total) under the tongue every 5 (five) minutes x 3 doses as needed for chest pain. 01/21/16   Cheryln Manly, NP  ondansetron (ZOFRAN ODT) 4 MG disintegrating tablet Take 1 tablet (4 mg total) by mouth every 8 (eight) hours as needed for nausea or vomiting. 02/04/16   Pleas Koch, NP  rosuvastatin (CRESTOR) 10 MG tablet   04/07/16   [provider]  sertraline (ZOLOFT) 50 MG tablet Take 1 tablet (50 mg total) by mouth daily. 03/24/16   Pleas Koch, NP    Allergies  Allergen Reactions  . Levaquin [Levofloxacin In D5w] Nausea And Vomiting  . Penicillins Hives    Childhood allergic reaction per spouse - no other information available 01/18/16    Family History  Problem Relation Age of Onset  . Lung cancer Father   . Emphysema Father   . Depression Mother   . CAD Brother   . Heart attack Brother     MI in his 75's    Social History Social History  Substance Use Topics  . Smoking status: Former Smoker    Quit date: 09/04/2004  . Smokeless tobacco: Never Used  . Alcohol use No    Review of Systems Constitutional: Negative for fever. Eyes: Negative for visual changes. ENT: Negative for congestion Cardiovascular: Negative for chest pain. Respiratory: Negative for shortness of breath. Gastrointestinal: Right flank pain. Positive for nausea vomiting diarrhea Genitourinary: Negative for dysuria. Negative for hematuria Musculoskeletal: Positive for right back pain Skin: Negative for rash. Neurological: Negative for headache All other ROS negative  ____________________________________________   PHYSICAL EXAM:  VITAL SIGNS: ED Triage Vitals [08/14/16 1250]  Enc Vitals Group     BP (!) 199/85     Pulse Rate 82     Resp 20     Temp 97.8 F (36.6 C)     Temp Source Oral     SpO2 96 %     Weight 138 lb (62.6 kg)     Height 5\' 9"  (1.753 m)     Head Circumference      Peak Flow      Pain Score 10     Pain Loc      Pain Edu?      Excl. in [place]?     Constitutional: Alert and oriented. Moderate distress due to right flank pain. Eyes: Normal exam ENT   Head: Normocephalic and atraumatic.   Mouth/Throat: Mucous membranes are moist. Cardiovascular: Normal rate, regular rhythm. No murmur Respiratory: Normal respiratory effort without tachypnea nor retractions. Breath  sounds are clear Gastrointestinal: Soft, moderate right upper and lower quadrant tenderness, mild voluntary guarding. No rebound. Mild CVA tenderness. No left-sided tenderness. Musculoskeletal: Nontender with normal range of motion in all extremities.  Neurologic:  Normal speech and language. No gross focal neurologic deficits  Skin:  Skin is warm, dry and intact.  Psychiatric: Mood and affect are normal. Speech and behavior are normal.   ____________________________________________    EKG  EKG reviewed and interpreted by myself shows normal sinus rhythm at 83 bpm, narrow QRS, normal axis, normal intervals, nonspecific ST changes without ST elevation.  ____________________________________________    RADIOLOGY  3 mm proximal right ureteral stone  ____________________________________________   INITIAL IMPRESSION / ASSESSMENT AND PLAN / ED COURSE  Pertinent labs & imaging  results that were available during my care of the patient were reviewed by me and considered in my medical decision making (see chart for details).  Patient presents to the emergency department with severe right flank pain which she states started this morning and has progressively worsened. Positive for nausea and vomiting which she states is due to the pain. Negative for diarrhea. Negative for hematuria or dysuria. Negative for any history of kidney stones in the past. We'll obtain a CT scan to evaluate for possible ureterolithiasis. We will treat pain and nausea, IV hydrate while awaiting results. Patient's labs are largely nonrevealing with a mild leukocytosis of 11,000.  CT shows a proximal 3 mm right ureteral stone.  Labs otherwise normal. Patient states his pain is somewhat improved with morphine. We will dose 50 mg of Toradol. I discussed with the patient once his pain is controlled we'll likely discharge home with Roxicet, Zofran and Flomax with follow-up with Dr. Erlene Quan. Patient is agreeable to this plan. CT  also showed a possible left inguinal hernia however of note the patient has no tenderness on the left side.  ____________________________________________   FINAL CLINICAL IMPRESSION(S) / ED DIAGNOSES  Right flank pain    Harvest Dark, MD 08/14/16 1523

## 2016-08-19 ENCOUNTER — Ambulatory Visit: Payer: PPO | Admitting: Neurology

## 2016-10-01 ENCOUNTER — Emergency Department: Payer: PPO

## 2016-10-01 ENCOUNTER — Encounter: Payer: Self-pay | Admitting: Emergency Medicine

## 2016-10-01 ENCOUNTER — Emergency Department
Admission: EM | Admit: 2016-10-01 | Discharge: 2016-10-02 | Disposition: A | Discharge status: Home / Self Care | Payer: PPO | Attending: Student in an Organized Health Care Education/Training Program | Admitting: Student in an Organized Health Care Education/Training Program

## 2016-10-01 DIAGNOSIS — Z79899 Other long term (current) drug therapy: Secondary | ICD-10-CM | POA: Insufficient documentation

## 2016-10-01 DIAGNOSIS — E119 Type 2 diabetes mellitus without complications: Secondary | ICD-10-CM | POA: Diagnosis not present

## 2016-10-01 DIAGNOSIS — I2510 Atherosclerotic heart disease of native coronary artery without angina pectoris: Secondary | ICD-10-CM | POA: Insufficient documentation

## 2016-10-01 DIAGNOSIS — J449 Chronic obstructive pulmonary disease, unspecified: Secondary | ICD-10-CM | POA: Insufficient documentation

## 2016-10-01 DIAGNOSIS — R109 Unspecified abdominal pain: Secondary | ICD-10-CM | POA: Diagnosis not present

## 2016-10-01 DIAGNOSIS — Z87891 Personal history of nicotine dependence: Secondary | ICD-10-CM | POA: Diagnosis not present

## 2016-10-01 DIAGNOSIS — R1031 Right lower quadrant pain: Secondary | ICD-10-CM | POA: Diagnosis not present

## 2016-10-01 DIAGNOSIS — Z7902 Long term (current) use of antithrombotics/antiplatelets: Secondary | ICD-10-CM | POA: Diagnosis not present

## 2016-10-01 DIAGNOSIS — N201 Calculus of ureter: Secondary | ICD-10-CM | POA: Insufficient documentation

## 2016-10-01 DIAGNOSIS — Z7982 Long term (current) use of aspirin: Secondary | ICD-10-CM | POA: Diagnosis not present

## 2016-10-01 LAB — COMPREHENSIVE METABOLIC PANEL
ALBUMIN: 3.9 g/dL (ref 3.5–5.0)
ALK PHOS: 92 U/L (ref 38–126)
AST: 25 U/L (ref 15–41)
Anion gap: 8 (ref 5–15)
BILIRUBIN TOTAL: 0.8 mg/dL (ref 0.3–1.2)
BUN: 16 mg/dL (ref 6–20)
CO2: 29 mmol/L (ref 22–32)
CREATININE: 1.03 mg/dL (ref 0.61–1.24)
Calcium: 8.6 mg/dL — ABNORMAL LOW (ref 8.9–10.3)
Chloride: 103 mmol/L (ref 101–111)
GFR calc Af Amer: >60 mL/min (ref >60)
GLUCOSE: 156 mg/dL — AB (ref 65–99)
POTASSIUM: 3.6 mmol/L (ref 3.5–5.1)
Sodium: 140 mmol/L (ref 135–145)
TOTAL PROTEIN: 7.2 g/dL (ref 6.5–8.1)

## 2016-10-01 LAB — CBC
HEMATOCRIT: 46.7 % (ref 40.0–52.0)
Hemoglobin: 16 g/dL (ref 13.0–18.0)
MCH: 27.9 pg (ref 26.0–34.0)
MCHC: 34.2 g/dL (ref 32.0–36.0)
MCV: 81.5 fL (ref 80.0–100.0)
PLATELETS: 257 10*3/uL (ref 150–440)
RBC: 5.73 MIL/uL (ref 4.40–5.90)
RDW: 14.1 % (ref 11.5–14.5)
WBC: 12 10*3/uL — AB (ref 3.8–10.6)

## 2016-10-01 LAB — LIPASE, BLOOD: Lipase: 26 U/L (ref 11–51)

## 2016-10-01 MED ORDER — MORPHINE SULFATE (PF) 4 MG/ML IV SOLN
4.0000 mg | Freq: Once | INTRAVENOUS | Status: AC
  Administered 2016-10-01: 4 mg via INTRAVENOUS

## 2016-10-01 MED ORDER — KETOROLAC TROMETHAMINE 30 MG/ML IJ SOLN
15.0000 mg | Freq: Once | INTRAMUSCULAR | Status: AC
  Administered 2016-10-02: 15 mg via INTRAVENOUS
  Filled 2016-10-01: qty 1

## 2016-10-01 MED ORDER — MORPHINE SULFATE (PF) 4 MG/ML IV SOLN
INTRAVENOUS | Status: AC
  Filled 2016-10-01: qty 1

## 2016-10-01 MED ORDER — ONDANSETRON HCL 4 MG/2ML IJ SOLN
4.0000 mg | Freq: Once | INTRAMUSCULAR | Status: AC
  Administered 2016-10-01: 4 mg via INTRAVENOUS

## 2016-10-01 MED ORDER — ONDANSETRON HCL 4 MG/2ML IJ SOLN
INTRAMUSCULAR | Status: AC
  Administered 2016-10-01: 4 mg via INTRAVENOUS
  Filled 2016-10-01: qty 2

## 2016-10-01 MED ORDER — MORPHINE SULFATE (PF) 4 MG/ML IV SOLN
INTRAVENOUS | Status: AC
  Administered 2016-10-01: 4 mg via INTRAVENOUS
  Filled 2016-10-01: qty 1

## 2016-10-01 MED ORDER — SODIUM CHLORIDE 0.9 % IV BOLUS (SEPSIS)
500.0000 mL | Freq: Once | INTRAVENOUS | Status: AC
  Administered 2016-10-02: 500 mL via INTRAVENOUS

## 2016-10-01 NOTE — ED Provider Notes (Signed)
[facility] Emergency Department Provider Note    None    (approximate)  I have reviewed the triage vital signs and the nursing notes.   HISTORY  Chief Complaint Flank Pain    HPI [name] is a 76 y.o. male acute right flank pain and right lower quadrant pain since 7:00 tonight. Patient does have a history of known kidney stones. Patient arrived arrives in significant discomfort.   Denies any chest pain. No diarrhea. Denies any dysuria. No fevers at home.    Past Medical History:  Diagnosis Date  . Chest pain   . COPD (chronic obstructive pulmonary disease) ([place])   . Depression   . Diabetes ([place])   . Dysphagia   . Essential tremor   . Heart disease   . Hyperlipidemia   . Hypertension   . Seizure disorder ([place])   . Tardive dyskinesia   . Vitamin D deficiency    Family History  Problem Relation Age of Onset  . Lung cancer Father   . Emphysema Father   . Depression Mother   . CAD Brother   . Heart attack Brother        MI in his 20's   Past Surgical History:  Procedure Laterality Date  . CARDIAC CATHETERIZATION N/A 01/19/2016   Procedure: Left Heart Cath and Coronary Angiography;  Surgeon: Jolaine Artist, MD;  Location: [facility];  Service: Cardiovascular;  Laterality: N/A;  . CARDIAC CATHETERIZATION  01/19/2016   Procedure: Coronary Stent Intervention;  Surgeon: Jettie Booze, MD;  Location: [facility];  Service: Cardiovascular;;  . INGUINAL HERNIA REPAIR Left as child   Patient Active Problem List   Diagnosis Date Noted  . Hypokalemia 06/20/2016  . Syncope   . CAD (coronary artery disease) 06/19/2016  . Claudication ([place]) 06/17/2016  . Status post coronary artery stent placement   . SVT (supraventricular tachycardia) ([place])   . NSVT (nonsustained ventricular tachycardia) ([place])   . NSTEMI (non-ST elevated myocardial infarction) ([place])   . Parkinson's variant of multiple system atrophy ([place])  12/19/2015  . Encephalopathy chronic 12/14/2015  . Chest pain   . COPD (chronic obstructive pulmonary disease) ([place])   . Heart disease   . Seizure disorder ([place])   . Depression   . Tardive dyskinesia   . Dysphagia   . Skin lesion 08/14/2015  . Shortness of breath 08/14/2015  . Essential hypertension 08/14/2015  . Hyperlipidemia 08/14/2015  . Essential tremor 08/14/2015      Prior to Admission medications   Medication Sig Start Date End Date Taking? Authorizing Provider  acetaminophen (TYLENOL) 500 MG tablet Take 500 mg by mouth every 6 (six) hours as needed for headache (pain).    [provider]  albuterol (PROVENTIL) (2.5 MG/3ML) 0.083% nebulizer solution Take 3 mLs (2.5 mg total) by nebulization 2 (two) times daily. 03/24/16   Pleas Koch, NP  aspirin EC 81 MG EC tablet Take 1 tablet (81 mg total) by mouth daily. 12/16/15   Regalado, Belkys A, MD  carbidopa-levodopa (SINEMET IR) 25-100 MG tablet Take 1.5 tablets by mouth 3 (three) times daily. 05/21/16   Pieter Partridge, DO  clopidogrel (PLAVIX) 75 MG tablet Take 1 tablet (75 mg total) by mouth daily. 04/14/16   Dorothy Spark, MD  imiquimod Leroy Sea) 5 % cream Apply 1 application topically 2 (two) times daily. 01/12/16   [provider]  losartan (COZAAR) 25 MG tablet Take 1 tablet (25  mg total) by mouth daily. 01/21/16   Cheryln Manly, NP  metoprolol succinate (TOPROL-XL) 50 MG 24 hr tablet Take 1 tablet (50 mg total) by mouth daily. Take with or immediately following a meal. 01/21/16   Cheryln Manly, NP  nitroGLYCERIN (NITROSTAT) 0.4 MG SL tablet Place 1 tablet (0.4 mg total) under the tongue every 5 (five) minutes x 3 doses as needed for chest pain. 01/21/16   Cheryln Manly, NP  ondansetron (ZOFRAN ODT) 4 MG disintegrating tablet Take 1 tablet (4 mg total) by mouth every 8 (eight) hours as needed for nausea or vomiting. 08/14/16   Harvest Dark, MD  oxyCODONE-acetaminophen (ROXICET) 5-325 MG  tablet Take 1 tablet by mouth every 6 (six) hours as needed. 08/14/16   Harvest Dark, MD  rosuvastatin (CRESTOR) 10 MG tablet  04/07/16   [provider]  sertraline (ZOLOFT) 50 MG tablet Take 1 tablet (50 mg total) by mouth daily. 03/24/16   Pleas Koch, NP  tamsulosin (FLOMAX) 0.4 MG CAPS capsule Take 1 capsule (0.4 mg total) by mouth daily. 08/14/16   Harvest Dark, MD    Allergies Levaquin [levofloxacin in d5w] and Penicillins    Social History Social History  Substance Use Topics  . Smoking status: Former Smoker    Quit date: 09/04/2004  . Smokeless tobacco: Never Used  . Alcohol use No    Review of Systems Patient denies headaches, rhinorrhea, blurry vision, numbness, shortness of breath, chest pain, edema, cough, abdominal pain, nausea, vomiting, diarrhea, dysuria, fevers, rashes or hallucinations unless otherwise stated above in HPI. ____________________________________________   PHYSICAL EXAM:  VITAL SIGNS: Vitals:   10/01/16 2330 10/02/16 0000  BP: (!) 182/80 (!) 166/82  Pulse: 66 74  Resp: (!) 24 19    Constitutional: Alert and oriented. ill appearing and uncomfortable appearing Eyes: Conjunctivae are normal.  Head: Atraumatic. Nose: No congestion/rhinnorhea. Mouth/Throat: Mucous membranes are moist.   Neck: No stridor. Painless ROM.  Cardiovascular: Normal rate, regular rhythm. Grossly normal heart sounds.  Good peripheral circulation. Respiratory: Normal respiratory effort.  No retractions. Lungs CTAB. Gastrointestinal: Soft and nontender. No distention. No abdominal bruits. + right CVA tenderness. Genitourinary:  Musculoskeletal: No lower extremity tenderness nor edema.  No joint effusions. Neurologic:  Normal speech and language. No gross focal neurologic deficits are appreciated. No facial droop Skin:  Skin is warm, dry and intact. No rash noted. Psychiatric: Mood and affect are normal. Speech and behavior are  normal. ____________________________________________   LABS (all labs ordered are listed, but only abnormal results are displayed)  Results for orders placed or performed during the hospital encounter of 10/01/16 (from the past 24 hour(s))  Lipase, blood     Status: None   Collection Time: 10/01/16  9:52 PM  Result Value Ref Range   Lipase 26 11 - 51 U/L  Comprehensive metabolic panel     Status: Abnormal   Collection Time: 10/01/16  9:52 PM  Result Value Ref Range   Sodium 140 135 - 145 mmol/L   Potassium 3.6 3.5 - 5.1 mmol/L   Chloride 103 101 - 111 mmol/L   CO2 29 22 - 32 mmol/L   Glucose, Bld 156 (H) 65 - 99 mg/dL   BUN 16 6 - 20 mg/dL   Creatinine, Ser 1.03 0.61 - 1.24 mg/dL   Calcium 8.6 (L) 8.9 - 10.3 mg/dL   Total Protein 7.2 6.5 - 8.1 g/dL   Albumin 3.9 3.5 - 5.0 g/dL   AST 25 15 -  41 U/L   ALT <5 (L) 17 - 63 U/L   Alkaline Phosphatase 92 38 - 126 U/L   Total Bilirubin 0.8 0.3 - 1.2 mg/dL   GFR calc non Af Amer >60 >60 mL/min   GFR calc Af Amer >60 >60 mL/min   Anion gap 8 5 - 15  CBC     Status: Abnormal   Collection Time: 10/01/16  9:52 PM  Result Value Ref Range   WBC 12.0 (H) 3.8 - 10.6 K/uL   RBC 5.73 4.40 - 5.90 MIL/uL   Hemoglobin 16.0 13.0 - 18.0 g/dL   HCT 46.7 40.0 - 52.0 %   MCV 81.5 80.0 - 100.0 fL   MCH 27.9 26.0 - 34.0 pg   MCHC 34.2 32.0 - 36.0 g/dL   RDW 14.1 11.5 - 14.5 %   Platelets 257 150 - 440 K/uL   ____________________________________________  ___________________________________  RADIOLOGY  I personally reviewed all radiographic images ordered to evaluate for the above acute complaints and reviewed radiology reports and findings.  These findings were personally discussed with the patient.  Please see medical record for radiology report.  ____________________________________________   PROCEDURES  Procedure(s) performed:  Procedures    Critical Care performed: no ____________________________________________   INITIAL  IMPRESSION / ASSESSMENT AND PLAN / ED COURSE  Pertinent labs & imaging results that were available during my care of the patient were reviewed by me and considered in my medical decision making (see chart for details).  DDX: stone, pyelo, appy, colitis, AAA  Jerrold Haskell is a 76 y.o. who presents to the ED with known stone p/w acute right flank pain. No fevers, no systemic symptoms. - urinary symptoms. Denies trauma or injury. Afebrile in ED. Exam as above. Flank TTP, otherwise abdominal exam is benign. No peritoneal signs. Possible kidney stone, cystitis, or pyelonephritis.   Checking urine. CT Stone with right stone with hydronephrosis and perinephric stranding Clinical picture is not consistent with appendicitis, diverticulitis, pancreatitis, cholecystitis, bowel perforation, aortic dissection, splenic injury or acute abdominal process at this time. Still awaiting urinalysis. Patient will be signed out to Dr. Mable Paris pending further results of urinalysis and reassessment. Anticipate patient would be appropriate for discharge home with pain controlled and no evidence of infection.     ____________________________________________   FINAL CLINICAL IMPRESSION(S) / ED DIAGNOSES  Final diagnoses:  Right flank pain  Ureterolithiasis      NEW MEDICATIONS STARTED DURING THIS VISIT:  New Prescriptions   No medications on file     Note:  This document was prepared using Dragon voice recognition software and may include unintentional dictation errors.    Merlyn Lot, MD 10/02/16 [phone]

## 2016-10-01 NOTE — ED Triage Notes (Signed)
Pt presents to ED via wheelchair c/o right flank pain radiating to RLQ since 7 pm tonight. Pt reports was dx with kidney stone 3 weeks ago on right side. Pt denies urinary symptoms. Pt appears in pain, moaning, unable to sit still.

## 2016-10-02 LAB — URINALYSIS, COMPLETE (UACMP) WITH MICROSCOPIC
Bilirubin Urine: NEGATIVE
GLUCOSE, UA: NEGATIVE mg/dL
Ketones, ur: 5 mg/dL — AB
Leukocytes, UA: NEGATIVE
NITRITE: NEGATIVE
PH: 5 (ref 5.0–8.0)
Protein, ur: 100 mg/dL — AB
SPECIFIC GRAVITY, URINE: 1.021 (ref 1.005–1.030)

## 2016-10-02 MED ORDER — HYDROCODONE-ACETAMINOPHEN 5-325 MG PO TABS: 1.0000 | ORAL_TABLET | Freq: Four times a day (QID) | ORAL | 0 refills | Status: DC | PRN

## 2016-10-02 NOTE — ED Notes (Signed)
Reviewed d/c instructions, follow-up care, prescription with patient. Pt verbalized understanding.  

## 2016-10-02 NOTE — ED Provider Notes (Signed)
The patient is well-appearing with no diaphoresis and normal vital signs. Urinalysis without evidence of infection. Stable for discharge according to Dr. Ruffin Frederick plan.   Darel Hong, MD 10/02/16 [phone]

## 2016-10-02 NOTE — Discharge Instructions (Signed)
It was a pleasure to take care of you today, and thank you for coming to our emergency department.  If you have any questions or concerns before leaving please ask the nurse to grab me and I'm more than happy to go through your aftercare instructions again.  If you were prescribed any opioid pain medication today such as Norco, Vicodin, Percocet, morphine, hydrocodone, or oxycodone please make sure you do not drive when you are taking this medication as it can alter your ability to drive safely.  If you have any concerns once you are home that you are not improving or are in fact getting worse before you can make it to your follow-up appointment, please do not hesitate to call 911 and come back for further evaluation.  Results for orders placed or performed during the hospital encounter of 10/01/16  Lipase, blood  Result Value Ref Range   Lipase 26 11 - 51 U/L  Comprehensive metabolic panel  Result Value Ref Range   Sodium 140 135 - 145 mmol/L   Potassium 3.6 3.5 - 5.1 mmol/L   Chloride 103 101 - 111 mmol/L   CO2 29 22 - 32 mmol/L   Glucose, Bld 156 (H) 65 - 99 mg/dL   BUN 16 6 - 20 mg/dL   Creatinine, Ser 1.03 0.61 - 1.24 mg/dL   Calcium 8.6 (L) 8.9 - 10.3 mg/dL   Total Protein 7.2 6.5 - 8.1 g/dL   Albumin 3.9 3.5 - 5.0 g/dL   AST 25 15 - 41 U/L   ALT <5 (L) 17 - 63 U/L   Alkaline Phosphatase 92 38 - 126 U/L   Total Bilirubin 0.8 0.3 - 1.2 mg/dL   GFR calc non Af Amer >60 >60 mL/min   GFR calc Af Amer >60 >60 mL/min   Anion gap 8 5 - 15  CBC  Result Value Ref Range   WBC 12.0 (H) 3.8 - 10.6 K/uL   RBC 5.73 4.40 - 5.90 MIL/uL   Hemoglobin 16.0 13.0 - 18.0 g/dL   HCT 46.7 40.0 - 52.0 %   MCV 81.5 80.0 - 100.0 fL   MCH 27.9 26.0 - 34.0 pg   MCHC 34.2 32.0 - 36.0 g/dL   RDW 14.1 11.5 - 14.5 %   Platelets 257 150 - 440 K/uL  Urinalysis, Complete w Microscopic  Result Value Ref Range   Color, Urine AMBER (A) YELLOW   APPearance CLOUDY (A) CLEAR   Specific Gravity, Urine 1.021  1.005 - 1.030   pH 5.0 5.0 - 8.0   Glucose, UA NEGATIVE NEGATIVE mg/dL   Hgb urine dipstick LARGE (A) NEGATIVE   Bilirubin Urine NEGATIVE NEGATIVE   Ketones, ur 5 (A) NEGATIVE mg/dL   Protein, ur 100 (A) NEGATIVE mg/dL   Nitrite NEGATIVE NEGATIVE   Leukocytes, UA NEGATIVE NEGATIVE   RBC / HPF TOO NUMEROUS TO COUNT 0 - 5 RBC/hpf   WBC, UA 6-30 0 - 5 WBC/hpf   Bacteria, UA FEW (A) NONE SEEN   Squamous Epithelial / LPF 0-5 (A) NONE SEEN   Mucous PRESENT    Ct Renal Stone Study  Result Date: 10/01/2016 CLINICAL DATA:  76 year old male with right flank pain. EXAM: CT ABDOMEN AND PELVIS WITHOUT CONTRAST TECHNIQUE: Multidetector CT imaging of the abdomen and pelvis was performed following the standard protocol without IV contrast. COMPARISON:  Abdominal CT dated 08/14/2016 FINDINGS: Evaluation of this exam is limited in the absence of intravenous contrast. Lower chest: The visualized lung bases are  clear. There is multi vessel coronary vascular calcification. No intra-abdominal free air or free fluid. Hepatobiliary: The liver is unremarkable. No intrahepatic biliary ductal dilatation. The gallbladder is unremarkable as well. Pancreas: Unremarkable. No pancreatic ductal dilatation or surrounding inflammatory changes. Spleen: Normal in size without focal abnormality. Adrenals/Urinary Tract: The adrenal glands are unremarkable. There is a 5 mm distal right ureteral calculus with moderate right hydronephrosis. There is right perinephric stranding. Correlation with urinalysis recommended to exclude UTI. The left kidney and left ureter are unremarkable. The urinary bladder is only partially distended. There is trabecular appearance of the bladder wall suggestive of chronic bladder outlet obstruction. Stomach/Bowel: There is sigmoid diverticulosis with muscular hypertrophy. No acute inflammatory changes. There is no evidence of bowel obstruction or active inflammation. Normal appendix. Vascular/Lymphatic: There  is advanced aortoiliac atherosclerotic disease. The abdominal aorta and IVC are otherwise unremarkable on this noncontrast study. No portal venous gas identified. There is no adenopathy. Reproductive: The prostate gland is enlarged measuring 5.3 cm in diameter. Other: There is fatty atrophy of the paraspinal musculature. There is also fatty atrophy of the gluteus minimus. Musculoskeletal: Osteopenia.  No acute fracture. IMPRESSION: 1. A 5 mm distal right ureteral stone with mild right hydronephrosis. Correlation with urinalysis recommended to exclude superimposed UTI. 2. Sigmoid diverticulosis without active inflammatory changes. No bowel obstruction. Normal appendix. 3.  Aortic Atherosclerosis (ICD10-I70.0). Electronically Signed   By: Anner Crete M.D.   On: 10/01/2016 22:54

## 2016-10-02 NOTE — ED Notes (Signed)
Patient c/o right flank pain beginning at 1900 today. Pt denies urinary changes.

## 2016-10-04 ENCOUNTER — Other Ambulatory Visit: Payer: Self-pay | Admitting: Cardiology

## 2016-10-25 ENCOUNTER — Emergency Department: Payer: PPO

## 2016-10-25 ENCOUNTER — Emergency Department
Admission: EM | Admit: 2016-10-25 | Discharge: 2016-10-26 | Disposition: A | Discharge status: Home / Self Care | Payer: PPO | Attending: Emergency Medicine | Admitting: Emergency Medicine

## 2016-10-25 DIAGNOSIS — R109 Unspecified abdominal pain: Secondary | ICD-10-CM

## 2016-10-25 DIAGNOSIS — I2510 Atherosclerotic heart disease of native coronary artery without angina pectoris: Secondary | ICD-10-CM | POA: Diagnosis not present

## 2016-10-25 DIAGNOSIS — I119 Hypertensive heart disease without heart failure: Secondary | ICD-10-CM | POA: Diagnosis not present

## 2016-10-25 DIAGNOSIS — Z955 Presence of coronary angioplasty implant and graft: Secondary | ICD-10-CM | POA: Insufficient documentation

## 2016-10-25 DIAGNOSIS — Z7982 Long term (current) use of aspirin: Secondary | ICD-10-CM | POA: Insufficient documentation

## 2016-10-25 DIAGNOSIS — J449 Chronic obstructive pulmonary disease, unspecified: Secondary | ICD-10-CM | POA: Diagnosis not present

## 2016-10-25 DIAGNOSIS — Z87891 Personal history of nicotine dependence: Secondary | ICD-10-CM | POA: Diagnosis not present

## 2016-10-25 DIAGNOSIS — Z8679 Personal history of other diseases of the circulatory system: Secondary | ICD-10-CM | POA: Insufficient documentation

## 2016-10-25 DIAGNOSIS — Z79899 Other long term (current) drug therapy: Secondary | ICD-10-CM | POA: Diagnosis not present

## 2016-10-25 DIAGNOSIS — N23 Unspecified renal colic: Secondary | ICD-10-CM | POA: Diagnosis not present

## 2016-10-25 DIAGNOSIS — N201 Calculus of ureter: Secondary | ICD-10-CM | POA: Diagnosis not present

## 2016-10-25 LAB — CBC
HEMATOCRIT: 45.9 % (ref 40.0–52.0)
HEMOGLOBIN: 15.5 g/dL (ref 13.0–18.0)
MCH: 28.4 pg (ref 26.0–34.0)
MCHC: 33.8 g/dL (ref 32.0–36.0)
MCV: 84 fL (ref 80.0–100.0)
Platelets: 231 10*3/uL (ref 150–440)
RBC: 5.46 MIL/uL (ref 4.40–5.90)
RDW: 14.5 % (ref 11.5–14.5)
WBC: 10.1 10*3/uL (ref 3.8–10.6)

## 2016-10-25 LAB — BASIC METABOLIC PANEL
ANION GAP: 10 (ref 5–15)
BUN: 16 mg/dL (ref 6–20)
CHLORIDE: 106 mmol/L (ref 101–111)
CO2: 26 mmol/L (ref 22–32)
Calcium: 9.4 mg/dL (ref 8.9–10.3)
Creatinine, Ser: 0.92 mg/dL (ref 0.61–1.24)
GFR calc Af Amer: >60 mL/min (ref >60)
GLUCOSE: 131 mg/dL — AB (ref 65–99)
POTASSIUM: 3.8 mmol/L (ref 3.5–5.1)
Sodium: 142 mmol/L (ref 135–145)

## 2016-10-25 LAB — LIPASE, BLOOD: Lipase: 28 U/L (ref 11–51)

## 2016-10-25 MED ORDER — SODIUM CHLORIDE 0.9 % IV BOLUS (SEPSIS)
1000.0000 mL | Freq: Once | INTRAVENOUS | Status: AC
  Administered 2016-10-25: 1000 mL via INTRAVENOUS

## 2016-10-25 MED ORDER — MORPHINE SULFATE (PF) 4 MG/ML IV SOLN
4.0000 mg | Freq: Once | INTRAVENOUS | Status: AC
  Administered 2016-10-25: 4 mg via INTRAVENOUS

## 2016-10-25 MED ORDER — ONDANSETRON HCL 4 MG/2ML IJ SOLN
INTRAMUSCULAR | Status: AC
  Administered 2016-10-25: 4 mg via INTRAVENOUS
  Filled 2016-10-25: qty 2

## 2016-10-25 MED ORDER — MORPHINE SULFATE (PF) 4 MG/ML IV SOLN
INTRAVENOUS | Status: AC
  Administered 2016-10-25: 4 mg via INTRAVENOUS
  Filled 2016-10-25: qty 1

## 2016-10-25 MED ORDER — ONDANSETRON HCL 4 MG/2ML IJ SOLN
4.0000 mg | Freq: Once | INTRAMUSCULAR | Status: AC
  Administered 2016-10-25: 4 mg via INTRAVENOUS

## 2016-10-25 NOTE — ED Triage Notes (Signed)
Pt presents top ED with c/o RIGHT flank pain with radiation into the RLQ of the abdomen that started around 5pm today. Pt denies N/V/D, denies urinary changes or c/o. Pt reports taking (1) Vicodin "a little after 7pm" without relief. Pt reports similar problems in the past and told it was kidney stones.

## 2016-10-25 NOTE — ED Provider Notes (Signed)
[facility] Emergency Department Provider Note  ____________________________________________  Time seen: Approximately 11:44 PM  I have reviewed the triage vital signs and the nursing notes.   HISTORY  Chief Complaint Flank Pain    HPI [name] is a 76 y.o. male who complains of gradual onset of right flank pain radiating to the right lower quadrant and into his scrotum. Started at about 5 PM. Constant, waxing and waning, no aggravating or alleviating factors. No dysuria frequency or urgency. Feels like renal colic he's had in the past. Fevers chills or vomiting. Pain is sharp and severe currently.     Past Medical History:  Diagnosis Date  . Chest pain   . COPD (chronic obstructive pulmonary disease) ([place])   . Depression   . Diabetes ([place])   . Dysphagia   . Essential tremor   . Heart disease   . Hyperlipidemia   . Hypertension   . Seizure disorder ([place])   . Tardive dyskinesia   . Vitamin D deficiency      Patient Active Problem List   Diagnosis Date Noted  . Hypokalemia 06/20/2016  . Syncope   . CAD (coronary artery disease) 06/19/2016  . Claudication ([place]) 06/17/2016  . Status post coronary artery stent placement   . SVT (supraventricular tachycardia) ([place])   . NSVT (nonsustained ventricular tachycardia) ([place])   . NSTEMI (non-ST elevated myocardial infarction) ([place])   . Parkinson's variant of multiple system atrophy ([place]) 12/19/2015  . Encephalopathy chronic 12/14/2015  . Chest pain   . COPD (chronic obstructive pulmonary disease) ([place])   . Heart disease   . Seizure disorder ([place])   . Depression   . Tardive dyskinesia   . Dysphagia   . Skin lesion 08/14/2015  . Shortness of breath 08/14/2015  . Essential hypertension 08/14/2015  . Hyperlipidemia 08/14/2015  . Essential tremor 08/14/2015     Past Surgical History:  Procedure Laterality Date  . CARDIAC CATHETERIZATION N/A 01/19/2016   Procedure: Left Heart Cath  and Coronary Angiography;  Surgeon: Jolaine Artist, MD;  Location: [facility];  Service: Cardiovascular;  Laterality: N/A;  . CARDIAC CATHETERIZATION  01/19/2016   Procedure: Coronary Stent Intervention;  Surgeon: Jettie Booze, MD;  Location: [facility];  Service: Cardiovascular;;  . INGUINAL HERNIA REPAIR Left as child     Prior to Admission medications   Medication Sig Start Date End Date Taking? Authorizing Provider  acetaminophen (TYLENOL) 500 MG tablet Take 500 mg by mouth every 6 (six) hours as needed for headache (pain).   Yes [provider]  albuterol (PROVENTIL) (2.5 MG/3ML) 0.083% nebulizer solution Take 3 mLs (2.5 mg total) by nebulization 2 (two) times daily. 03/24/16  Yes Pleas Koch, NP  aspirin EC 81 MG EC tablet Take 1 tablet (81 mg total) by mouth daily. 12/16/15  Yes Regalado, Belkys A, MD  carbidopa-levodopa (SINEMET IR) 25-100 MG tablet Take 1.5 tablets by mouth 3 (three) times daily. 05/21/16  Yes Tomi Likens, Adam R, DO  clopidogrel (PLAVIX) 75 MG tablet Take 1 tablet (75 mg total) by mouth daily. 04/14/16  Yes Dorothy Spark, MD  HYDROcodone-acetaminophen (NORCO) 5-325 MG tablet Take 1 tablet by mouth every 6 (six) hours as needed for severe pain. 10/02/16  Yes Darel Hong, MD  losartan (COZAAR) 25 MG tablet Take 1 tablet (25 mg total) by mouth daily. 10/04/16  Yes Dorothy Spark, MD  metoprolol succinate (TOPROL-XL) 50 MG 24 hr tablet Take 1  tablet (50 mg total) by mouth daily. Take with or immediately following a meal. 01/21/16  Yes Cheryln Manly, NP  nitroGLYCERIN (NITROSTAT) 0.4 MG SL tablet Place 1 tablet (0.4 mg total) under the tongue every 5 (five) minutes x 3 doses as needed for chest pain. 01/21/16  Yes Cheryln Manly, NP  rosuvastatin (CRESTOR) 10 MG tablet  04/07/16  Yes [provider]  sertraline (ZOLOFT) 50 MG tablet Take 1 tablet (50 mg total) by mouth daily. 03/24/16  Yes Pleas Koch, NP   tamsulosin (FLOMAX) 0.4 MG CAPS capsule Take 1 capsule (0.4 mg total) by mouth daily. 08/14/16  Yes Paduchowski, Lennette Bihari, MD  imiquimod [place]) 5 % cream Apply 1 application topically 2 (two) times daily. 01/12/16   [provider]     Allergies Levaquin [levofloxacin in d5w] and Penicillins   Family History  Problem Relation Age of Onset  . Lung cancer Father   . Emphysema Father   . Depression Mother   . CAD Brother   . Heart attack Brother        MI in his 16's    Social History Social History  Substance Use Topics  . Smoking status: Former Smoker    Quit date: 09/04/2004  . Smokeless tobacco: Never Used  . Alcohol use No    Review of Systems  Constitutional:   No fever or chills.  ENT:   No sore throat. No rhinorrhea. Cardiovascular:   No chest pain or syncope. Respiratory:   No dyspnea or cough. Gastrointestinal:   Right flank pain as above..  Musculoskeletal:   Negative for focal pain or swelling All other systems reviewed and are negative except as documented above in ROS and HPI.  ____________________________________________   PHYSICAL EXAM:  VITAL SIGNS: ED Triage Vitals  Enc Vitals Group     BP 10/25/16 2030 (!) 172/83     Pulse Rate 10/25/16 2030 (!) 103     Resp 10/25/16 2030 18     Temp 10/25/16 2030 98.3 F (36.8 C)     Temp Source 10/25/16 2030 Oral     SpO2 10/25/16 2030 97 %     Weight 10/25/16 2024 160 lb (72.6 kg)     Height 10/25/16 2024 5\' 9"  (1.753 m)     Head Circumference --      Peak Flow --      Pain Score 10/25/16 2023 10     Pain Loc --      Pain Edu? --      Excl. in [place]? --     Vital signs reviewed, nursing assessments reviewed.   Constitutional:   Alert and oriented. Well appearing and in no distress. Eyes:   No scleral icterus.  EOMI. No nystagmus. No conjunctival pallor. PERRL. ENT   Head:   Normocephalic and atraumatic.   Nose:   No congestion/rhinnorhea.    Mouth/Throat:   MMM, no pharyngeal  erythema. No peritonsillar mass.    Neck:   No meningismus. Full ROM Hematological/Lymphatic/Immunilogical:   No cervical lymphadenopathy. Cardiovascular:   RRR. Symmetric bilateral radial and DP pulses.  No murmurs.  Respiratory:   Normal respiratory effort without tachypnea/retractions. Breath sounds are clear and equal bilaterally. No wheezes/rales/rhonchi. Gastrointestinal:   Soft With right mid abdominal tenderness and right lower quadrant tenderness. No focal tenderness at McBurney's point. Mild tenderness at the suprapubic area as well. Non distended. There is no CVA tenderness.  No rebound, rigidity, or guarding. Genitourinary:   deferred  Musculoskeletal:   Normal range of motion in all extremities. No joint effusions.  No lower extremity tenderness.  No edema. Neurologic:   Normal speech and language.  Motor grossly intact. No gross focal neurologic deficits are appreciated.  Skin:    Skin is warm, dry and intact. No rash noted.  No petechiae, purpura, or bullae.  ____________________________________________    LABS (pertinent positives/negatives) (all labs ordered are listed, but only abnormal results are displayed) Labs Reviewed  BASIC METABOLIC PANEL - Abnormal; Notable for the following:       Result Value   Glucose, Bld 131 (*)    All other components within normal limits  CBC  LIPASE, BLOOD  URINALYSIS, COMPLETE (UACMP) WITH MICROSCOPIC   ____________________________________________   EKG    ____________________________________________    RADIOLOGY  Ct Renal Stone Study  Result Date: 10/25/2016 CLINICAL DATA:  Right flank pain radiating to the right lower quadrant starting at 5 p.m. today. EXAM: CT ABDOMEN AND PELVIS WITHOUT CONTRAST TECHNIQUE: Multidetector CT imaging of the abdomen and pelvis was performed following the standard protocol without IV contrast. COMPARISON:  10/01/2016 FINDINGS: Lower chest: Coronary arteriosclerosis noted of the normal sized  included heart. No pericardial effusion. There is aortic atherosclerosis. Mild interstitial change noted at the right lung base that may reflect areas of minimal fibrosis and/or atelectasis. Tiny 3 mm subpleural nodular density is seen on series 4, image 16. Previously it had the appearance of atelectasis and may still represent atelectasis but appears slightly more rounded on today's study. Hepatobiliary: No focal liver abnormality is seen. No gallstones, gallbladder wall thickening, or biliary dilatation. Pancreas: Unremarkable. No pancreatic ductal dilatation or surrounding inflammatory changes. Spleen: Normal in size without focal abnormality. Adrenals/Urinary Tract: Normal appearing bilateral adrenal glands. There is a 5 mm right ureterovesical junction stone causing moderate to marked right-sided hydroureteronephrosis and perinephric fat stranding. No nephrolithiasis or obstructive uropathy on the left. The bladder is slightly thick-walled in appearance but is likely due to underdistention. Stomach/Bowel: Sigmoid diverticulosis with circular muscle hypertrophy. No acute bowel inflammation or obstruction. No findings of acute appendicitis. Vascular/Lymphatic: Aortoiliac and branch vessel atherosclerosis. No aneurysm. No lymphadenopathy. Reproductive: Enlarged prostate measuring up to 5.4 cm transverse with peripheral zone calcifications. Other: Paraspinal muscle atrophy. Musculoskeletal: Diffuse idiopathic skeletal hyperostosis along the lower thoracic and upper lumbar spine. No acute nor suspicious osseous abnormalities. IMPRESSION: 1. 5 mm distal right UVJ stone causing moderate to marked hydroureteronephrosis and perinephric fat stranding. 2. Under distended bladder which may account for the circumferential thick-walled appearance. Cystitis is not excluded. 3. Coronary arteriosclerosis. 4. Mild interstitial change at the right lung base may reflect areas of minimal fibrosis and/or atelectasis. 3 mm nodular  density at the right lung base. No follow-up needed if patient is low-risk. Non-contrast chest CT can be considered in 12 months if patient is high-risk. This recommendation follows the consensus statement: Guidelines for Management of Incidental Pulmonary Nodules Detected on CT Images: From the Fleischner Society 2017; Radiology 2017; 284:228-243. 5. Aortoiliac and branch vessel atherosclerosis. 6. Mild prostatomegaly. Electronically Signed   By: Ashley Royalty M.D.   On: 10/25/2016 22:43    ____________________________________________   PROCEDURES Procedures  ____________________________________________   INITIAL IMPRESSION / ASSESSMENT AND PLAN / ED COURSE  Pertinent labs & imaging results that were available during my care of the patient were reviewed by me and considered in my medical decision making (see chart for details).  Patient presents with right flank pain radiating to right lower quadrant,  similar to renal colic is had in the past. With abdominal tenderness we'll get a CT scan to evaluate for obstructing stone versus appendicitis. Low suspicion for vascular event such as AAA dissection or mesenteric ischemia. Patient is not septic.  Clinical Course as of Oct 26 2342  Mon Oct 25, 2016  2306 CT shows 35mm R UVJ stone with sig. Hydronephrosis. Will f/u UA.  [PS]    Clinical Course User Index [PS] Carrie Mew, MD     ----------------------------------------- 11:46 PM on 10/25/2016 -----------------------------------------  CT reveals 5 mm stone at right UVJ with moderate hydronephrosis.  We'll give Flomax. Still awaiting urinalysis to ensure he doesn't have a urinary tract infection. Case and to Dr. Beather Arbour to follow-up on UA.  ____________________________________________   FINAL CLINICAL IMPRESSION(S) / ED DIAGNOSES  Final diagnoses:  Right flank pain  Ureteral colic      New Prescriptions   No medications on file     Portions of this note were generated  with dragon dictation software. Dictation errors may occur despite best attempts at proofreading.    Carrie Mew, MD 10/25/16 2348

## 2016-10-25 NOTE — ED Notes (Signed)
Pt states R sided pain that radiates down into privates. States pain began around 5pm today. Denies nausea or vomiting, denies fever. Denies urinary pain or frequency. States has been told he has kidney stones but "I haven't passed any yet." States was told kidney stone is 6mm but unsure if he has taken flomax. Pt has been taking percocet for pain.

## 2016-10-26 LAB — URINALYSIS, COMPLETE (UACMP) WITH MICROSCOPIC
Bacteria, UA: NONE SEEN
Bilirubin Urine: NEGATIVE
GLUCOSE, UA: NEGATIVE mg/dL
KETONES UR: 5 mg/dL — AB
Leukocytes, UA: NEGATIVE
Nitrite: NEGATIVE
PH: 5 (ref 5.0–8.0)
Protein, ur: NEGATIVE mg/dL
SPECIFIC GRAVITY, URINE: 1.02 (ref 1.005–1.030)
Squamous Epithelial / LPF: NONE SEEN

## 2016-10-26 MED ORDER — OXYCODONE-ACETAMINOPHEN 5-325 MG PO TABS
2.0000 | ORAL_TABLET | Freq: Once | ORAL | Status: AC
  Administered 2016-10-26: 2 via ORAL
  Filled 2016-10-26: qty 2

## 2016-10-26 MED ORDER — ONDANSETRON 4 MG PO TBDP: 4.0000 mg | ORAL_TABLET | Freq: Three times a day (TID) | ORAL | 0 refills | Status: DC | PRN

## 2016-10-26 MED ORDER — ONDANSETRON 4 MG PO TBDP
ORAL_TABLET | ORAL | Status: AC
  Administered 2016-10-26: 4 mg via ORAL
  Filled 2016-10-26: qty 1

## 2016-10-26 MED ORDER — TAMSULOSIN HCL 0.4 MG PO CAPS: 0.4000 mg | ORAL_CAPSULE | Freq: Every day | ORAL | 0 refills | Status: DC

## 2016-10-26 MED ORDER — OXYCODONE-ACETAMINOPHEN 5-325 MG PO TABS: 1.0000 | ORAL_TABLET | ORAL | 0 refills | Status: DC | PRN

## 2016-10-26 MED ORDER — ONDANSETRON 4 MG PO TBDP
4.0000 mg | ORAL_TABLET | Freq: Once | ORAL | Status: AC
  Administered 2016-10-26: 4 mg via ORAL

## 2016-10-26 MED ORDER — TAMSULOSIN HCL 0.4 MG PO CAPS
0.4000 mg | ORAL_CAPSULE | Freq: Once | ORAL | Status: AC
  Administered 2016-10-26: 0.4 mg via ORAL
  Filled 2016-10-26: qty 1

## 2016-10-26 NOTE — Discharge Instructions (Signed)
1. Take pain & nausea medicines as needed (Percocet/Zofran #30). Make sure to take a stool softener while taking narcotic pain medicines. 2. Take Flomax 0.4mg daily x 14 days. 3. Drink plenty of bottled or filtered water daily. 4. Return to the ER for worsening symptoms, persistent vomiting, fever, difficulty breathing or other concerns.  

## 2016-10-26 NOTE — ED Notes (Signed)
Pt discharged to home.  Family member driving.  Discharge instructions reviewed.  Verbalized understanding.  No questions or concerns at this time.  Teach back verified.  Pt in NAD.  No items left in ED.   

## 2016-10-26 NOTE — ED Provider Notes (Signed)
-----------------------------------------   12:35 AM on 10/26/2016 -----------------------------------------  Updated patient and spouse of urine results. Will discharge home with prescriptions for Percocet, Zofran and Flomax. Patient will follow-up with urology as instructed. Strict return precautions given. Patient and spouse verbalize understanding and agree with plan of care.   Paulette Blanch, MD 10/26/16 [phone]

## 2016-10-26 NOTE — ED Notes (Signed)
Pt dry heaving upon arrival to discharge patient.  Zofran ODT ordered per EDP.

## 2017-08-09 ENCOUNTER — Emergency Department: Payer: PPO

## 2017-08-09 ENCOUNTER — Other Ambulatory Visit: Payer: Self-pay

## 2017-08-09 ENCOUNTER — Observation Stay
Admission: EM | Admit: 2017-08-09 | Discharge: 2017-08-10 | Disposition: A | Discharge status: Home / Self Care | Payer: PPO | Attending: Internal Medicine | Admitting: Internal Medicine

## 2017-08-09 ENCOUNTER — Encounter: Payer: Self-pay | Admitting: Emergency Medicine

## 2017-08-09 DIAGNOSIS — E559 Vitamin D deficiency, unspecified: Secondary | ICD-10-CM | POA: Insufficient documentation

## 2017-08-09 DIAGNOSIS — K760 Fatty (change of) liver, not elsewhere classified: Secondary | ICD-10-CM | POA: Diagnosis not present

## 2017-08-09 DIAGNOSIS — I10 Essential (primary) hypertension: Secondary | ICD-10-CM | POA: Diagnosis not present

## 2017-08-09 DIAGNOSIS — Z8249 Family history of ischemic heart disease and other diseases of the circulatory system: Secondary | ICD-10-CM | POA: Insufficient documentation

## 2017-08-09 DIAGNOSIS — I701 Atherosclerosis of renal artery: Secondary | ICD-10-CM | POA: Insufficient documentation

## 2017-08-09 DIAGNOSIS — I2510 Atherosclerotic heart disease of native coronary artery without angina pectoris: Secondary | ICD-10-CM | POA: Diagnosis not present

## 2017-08-09 DIAGNOSIS — Z881 Allergy status to other antibiotic agents status: Secondary | ICD-10-CM | POA: Insufficient documentation

## 2017-08-09 DIAGNOSIS — K5730 Diverticulosis of large intestine without perforation or abscess without bleeding: Secondary | ICD-10-CM | POA: Insufficient documentation

## 2017-08-09 DIAGNOSIS — J439 Emphysema, unspecified: Secondary | ICD-10-CM | POA: Diagnosis not present

## 2017-08-09 DIAGNOSIS — F329 Major depressive disorder, single episode, unspecified: Secondary | ICD-10-CM | POA: Diagnosis not present

## 2017-08-09 DIAGNOSIS — Z7982 Long term (current) use of aspirin: Secondary | ICD-10-CM | POA: Insufficient documentation

## 2017-08-09 DIAGNOSIS — E1151 Type 2 diabetes mellitus with diabetic peripheral angiopathy without gangrene: Secondary | ICD-10-CM | POA: Insufficient documentation

## 2017-08-09 DIAGNOSIS — Z955 Presence of coronary angioplasty implant and graft: Secondary | ICD-10-CM | POA: Diagnosis not present

## 2017-08-09 DIAGNOSIS — G250 Essential tremor: Secondary | ICD-10-CM | POA: Insufficient documentation

## 2017-08-09 DIAGNOSIS — R0602 Shortness of breath: Secondary | ICD-10-CM | POA: Diagnosis not present

## 2017-08-09 DIAGNOSIS — Z880 Allergy status to penicillin: Secondary | ICD-10-CM | POA: Diagnosis not present

## 2017-08-09 DIAGNOSIS — E785 Hyperlipidemia, unspecified: Secondary | ICD-10-CM | POA: Diagnosis not present

## 2017-08-09 DIAGNOSIS — I252 Old myocardial infarction: Secondary | ICD-10-CM | POA: Insufficient documentation

## 2017-08-09 DIAGNOSIS — E872 Acidosis: Secondary | ICD-10-CM | POA: Insufficient documentation

## 2017-08-09 DIAGNOSIS — I739 Peripheral vascular disease, unspecified: Secondary | ICD-10-CM | POA: Diagnosis not present

## 2017-08-09 DIAGNOSIS — J441 Chronic obstructive pulmonary disease with (acute) exacerbation: Secondary | ICD-10-CM | POA: Diagnosis present

## 2017-08-09 DIAGNOSIS — Z87891 Personal history of nicotine dependence: Secondary | ICD-10-CM | POA: Diagnosis not present

## 2017-08-09 DIAGNOSIS — Z79899 Other long term (current) drug therapy: Secondary | ICD-10-CM | POA: Insufficient documentation

## 2017-08-09 DIAGNOSIS — Z7902 Long term (current) use of antithrombotics/antiplatelets: Secondary | ICD-10-CM | POA: Insufficient documentation

## 2017-08-09 DIAGNOSIS — G40909 Epilepsy, unspecified, not intractable, without status epilepticus: Secondary | ICD-10-CM | POA: Diagnosis not present

## 2017-08-09 DIAGNOSIS — R000 Tachycardia, unspecified: Secondary | ICD-10-CM | POA: Diagnosis not present

## 2017-08-09 DIAGNOSIS — R0902 Hypoxemia: Secondary | ICD-10-CM | POA: Diagnosis not present

## 2017-08-09 DIAGNOSIS — I700 Atherosclerosis of aorta: Secondary | ICD-10-CM | POA: Insufficient documentation

## 2017-08-09 LAB — URINALYSIS, COMPLETE (UACMP) WITH MICROSCOPIC
BACTERIA UA: NONE SEEN
BILIRUBIN URINE: NEGATIVE
Glucose, UA: NEGATIVE mg/dL
KETONES UR: 20 mg/dL — AB
LEUKOCYTES UA: NEGATIVE
NITRITE: NEGATIVE
PH: 6 (ref 5.0–8.0)
Protein, ur: 30 mg/dL — AB
SPECIFIC GRAVITY, URINE: 1.045 — AB (ref 1.005–1.030)
SQUAMOUS EPITHELIAL / LPF: NONE SEEN (ref 0–5)

## 2017-08-09 LAB — CBC
HEMATOCRIT: 49 % (ref 40.0–52.0)
HEMOGLOBIN: 16.4 g/dL (ref 13.0–18.0)
MCH: 28.1 pg (ref 26.0–34.0)
MCHC: 33.5 g/dL (ref 32.0–36.0)
MCV: 83.8 fL (ref 80.0–100.0)
Platelets: 245 10*3/uL (ref 150–440)
RBC: 5.85 MIL/uL (ref 4.40–5.90)
RDW: 14.2 % (ref 11.5–14.5)
WBC: 19.5 10*3/uL — ABNORMAL HIGH (ref 3.8–10.6)

## 2017-08-09 LAB — COMPREHENSIVE METABOLIC PANEL
ALBUMIN: 3.7 g/dL (ref 3.5–5.0)
ALK PHOS: 88 U/L (ref 38–126)
ALT: 9 U/L — ABNORMAL LOW (ref 17–63)
ANION GAP: 11 (ref 5–15)
AST: 17 U/L (ref 15–41)
BILIRUBIN TOTAL: 2.9 mg/dL — AB (ref 0.3–1.2)
BUN: 14 mg/dL (ref 6–20)
CALCIUM: 8.5 mg/dL — AB (ref 8.9–10.3)
CO2: 24 mmol/L (ref 22–32)
Chloride: 99 mmol/L — ABNORMAL LOW (ref 101–111)
Creatinine, Ser: 0.72 mg/dL (ref 0.61–1.24)
GFR calc Af Amer: >60 mL/min (ref >60)
GFR calc non Af Amer: >60 mL/min (ref >60)
Glucose, Bld: 147 mg/dL — ABNORMAL HIGH (ref 65–99)
POTASSIUM: 4 mmol/L (ref 3.5–5.1)
Sodium: 134 mmol/L — ABNORMAL LOW (ref 135–145)
TOTAL PROTEIN: 7.3 g/dL (ref 6.5–8.1)

## 2017-08-09 LAB — TROPONIN I

## 2017-08-09 LAB — LACTIC ACID, PLASMA
LACTIC ACID, VENOUS: 3.1 mmol/L — AB (ref 0.5–1.9)
LACTIC ACID, VENOUS: 4.8 mmol/L — AB (ref 0.5–1.9)

## 2017-08-09 LAB — PROCALCITONIN: PROCALCITONIN: 0.17 ng/mL

## 2017-08-09 LAB — LIPASE, BLOOD: LIPASE: 21 U/L (ref 11–51)

## 2017-08-09 MED ORDER — LOSARTAN POTASSIUM 25 MG PO TABS
25.0000 mg | ORAL_TABLET | Freq: Every day | ORAL | Status: DC
  Administered 2017-08-09 – 2017-08-10 (×2): 25 mg via ORAL
  Filled 2017-08-09 (×2): qty 1

## 2017-08-09 MED ORDER — SODIUM CHLORIDE 0.9 % IV BOLUS
1000.0000 mL | Freq: Once | INTRAVENOUS | Status: AC
  Administered 2017-08-09: 1000 mL via INTRAVENOUS

## 2017-08-09 MED ORDER — MORPHINE SULFATE (PF) 4 MG/ML IV SOLN
4.0000 mg | Freq: Once | INTRAVENOUS | Status: AC
  Administered 2017-08-09: 4 mg via INTRAVENOUS
  Filled 2017-08-09: qty 1

## 2017-08-09 MED ORDER — CLOPIDOGREL BISULFATE 75 MG PO TABS
75.0000 mg | ORAL_TABLET | Freq: Every day | ORAL | Status: DC
  Administered 2017-08-09 – 2017-08-10 (×2): 75 mg via ORAL
  Filled 2017-08-09 (×2): qty 1

## 2017-08-09 MED ORDER — IPRATROPIUM-ALBUTEROL 0.5-2.5 (3) MG/3ML IN SOLN
3.0000 mL | RESPIRATORY_TRACT | Status: DC
  Administered 2017-08-09 – 2017-08-10 (×5): 3 mL via RESPIRATORY_TRACT
  Filled 2017-08-09 (×4): qty 3

## 2017-08-09 MED ORDER — ONDANSETRON HCL 4 MG/2ML IJ SOLN
4.0000 mg | Freq: Once | INTRAMUSCULAR | Status: AC
  Administered 2017-08-09: 4 mg via INTRAVENOUS
  Filled 2017-08-09: qty 2

## 2017-08-09 MED ORDER — ENOXAPARIN SODIUM 40 MG/0.4ML ~~LOC~~ SOLN
40.0000 mg | SUBCUTANEOUS | Status: DC
  Administered 2017-08-09: 22:00:00 40 mg via SUBCUTANEOUS
  Filled 2017-08-09: qty 0.4

## 2017-08-09 MED ORDER — ACETAMINOPHEN 650 MG RE SUPP: 650.0000 mg | Freq: Four times a day (QID) | RECTAL | Status: DC | PRN

## 2017-08-09 MED ORDER — IPRATROPIUM-ALBUTEROL 0.5-2.5 (3) MG/3ML IN SOLN
3.0000 mL | Freq: Once | RESPIRATORY_TRACT | Status: AC
  Administered 2017-08-09: 3 mL via RESPIRATORY_TRACT
  Filled 2017-08-09: qty 3

## 2017-08-09 MED ORDER — SODIUM CHLORIDE 0.9 % IV SOLN
INTRAVENOUS | Status: AC
  Administered 2017-08-09 – 2017-08-10 (×2): via INTRAVENOUS

## 2017-08-09 MED ORDER — CARBIDOPA-LEVODOPA 25-100 MG PO TABS
1.5000 | ORAL_TABLET | Freq: Three times a day (TID) | ORAL | Status: DC
  Administered 2017-08-09 – 2017-08-10 (×4): 1.5 via ORAL
  Filled 2017-08-09 (×7): qty 1.5

## 2017-08-09 MED ORDER — IOPAMIDOL (ISOVUE-370) INJECTION 76%
100.0000 mL | Freq: Once | INTRAVENOUS | Status: AC | PRN
  Administered 2017-08-09: 100 mL via INTRAVENOUS

## 2017-08-09 MED ORDER — ACETAMINOPHEN 325 MG PO TABS: 650.0000 mg | ORAL_TABLET | Freq: Four times a day (QID) | ORAL | Status: DC | PRN

## 2017-08-09 MED ORDER — VANCOMYCIN HCL IN DEXTROSE 1-5 GM/200ML-% IV SOLN
1000.0000 mg | Freq: Once | INTRAVENOUS | Status: AC
  Administered 2017-08-09: 1000 mg via INTRAVENOUS
  Filled 2017-08-09: qty 200

## 2017-08-09 MED ORDER — OXYCODONE-ACETAMINOPHEN 5-325 MG PO TABS
1.0000 | ORAL_TABLET | ORAL | Status: DC | PRN
  Administered 2017-08-09: 1 via ORAL
  Filled 2017-08-09: qty 1

## 2017-08-09 MED ORDER — METOPROLOL SUCCINATE ER 50 MG PO TB24
50.0000 mg | ORAL_TABLET | Freq: Every day | ORAL | Status: DC
  Administered 2017-08-09 – 2017-08-10 (×2): 50 mg via ORAL
  Filled 2017-08-09 (×2): qty 1

## 2017-08-09 MED ORDER — SODIUM CHLORIDE 0.9 % IV SOLN
500.0000 mg | INTRAVENOUS | Status: DC
  Administered 2017-08-09 – 2017-08-10 (×2): 500 mg via INTRAVENOUS
  Filled 2017-08-09 (×3): qty 500

## 2017-08-09 MED ORDER — ASPIRIN EC 81 MG PO TBEC
81.0000 mg | DELAYED_RELEASE_TABLET | Freq: Every day | ORAL | Status: DC
  Administered 2017-08-09 – 2017-08-10 (×2): 81 mg via ORAL
  Filled 2017-08-09 (×2): qty 1

## 2017-08-09 MED ORDER — ONDANSETRON HCL 4 MG PO TABS: 4.0000 mg | ORAL_TABLET | Freq: Four times a day (QID) | ORAL | Status: DC | PRN

## 2017-08-09 MED ORDER — METHYLPREDNISOLONE SODIUM SUCC 125 MG IJ SOLR
60.0000 mg | Freq: Three times a day (TID) | INTRAMUSCULAR | Status: DC
  Administered 2017-08-09 – 2017-08-10 (×3): 60 mg via INTRAVENOUS
  Filled 2017-08-09 (×3): qty 2

## 2017-08-09 MED ORDER — ONDANSETRON HCL 4 MG/2ML IJ SOLN: 4.0000 mg | Freq: Four times a day (QID) | INTRAMUSCULAR | Status: DC | PRN

## 2017-08-09 MED ORDER — PIPERACILLIN-TAZOBACTAM 3.375 G IVPB 30 MIN
3.3750 g | Freq: Once | INTRAVENOUS | Status: AC
  Administered 2017-08-09: 3.375 g via INTRAVENOUS
  Filled 2017-08-09: qty 50

## 2017-08-09 MED ORDER — SODIUM CHLORIDE 0.9 % IV SOLN
1.0000 g | INTRAVENOUS | Status: DC
  Administered 2017-08-09: 16:00:00 1 g via INTRAVENOUS
  Filled 2017-08-09 (×2): qty 10

## 2017-08-09 MED ORDER — IPRATROPIUM-ALBUTEROL 0.5-2.5 (3) MG/3ML IN SOLN
3.0000 mL | Freq: Once | RESPIRATORY_TRACT | Status: AC
Start: 2017-08-09 — End: 2017-08-09
  Administered 2017-08-09: 3 mL via RESPIRATORY_TRACT
  Filled 2017-08-09: qty 3

## 2017-08-09 MED ORDER — METHYLPREDNISOLONE SODIUM SUCC 125 MG IJ SOLR
125.0000 mg | Freq: Once | INTRAMUSCULAR | Status: AC
  Administered 2017-08-09: 125 mg via INTRAVENOUS
  Filled 2017-08-09: qty 2

## 2017-08-09 NOTE — H&P (Signed)
[facility] [facility] NAME: [name]    MR#:  [id number]  DATE OF BIRTH:  1940/08/21  DATE OF ADMISSION:  08/09/2017  PRIMARY CARE PHYSICIAN: Pleas Koch, NP   REQUESTING/REFERRING PHYSICIAN: Dr. Lavonia Drafts  CHIEF COMPLAINT:   Chief Complaint  Patient presents with  . Shortness of Breath    HISTORY OF PRESENT ILLNESS:  [name]  is a 77 y.o. male with a known history of COPD not on home oxygen, hypertension, tardive dyskinesia, peripheral vascular disease, CAD status post stent presents to hospital secondary to worsening shortness of breath and cough. Symptoms started about a week ago and patient was mowing in his yard, wife thinks pollen exposure could have started his dry cough.  Over the last week symptoms were getting worse.  In the last 2 days he was having visible shortness of breath and dyspnea.  Since he was not getting any better, I presented to the ER today.  Denies any fevers or chills.  Complains of weakness.  Has been having bilateral calf pain.  Dopplers negative for DVT as outpatient.  Likely from his peripheral vascular disease.  Not having any productive cough.  No nausea or vomiting, no abdominal pain.  PAST MEDICAL HISTORY:   Past Medical History:  Diagnosis Date  . Chest pain   . COPD (chronic obstructive pulmonary disease) ([place])   . Depression   . Diabetes ([place])   . Dysphagia   . Essential tremor   . Heart disease   . Hyperlipidemia   . Hypertension   . Seizure disorder ([place])   . Tardive dyskinesia   . Vitamin D deficiency     PAST SURGICAL HISTORY:   Past Surgical History:  Procedure Laterality Date  . CARDIAC CATHETERIZATION N/A 01/19/2016   Procedure: Left Heart Cath and Coronary Angiography;  Surgeon: Jolaine Artist, MD;  Location: [facility];  Service: Cardiovascular;  Laterality: N/A;  . CARDIAC CATHETERIZATION  01/19/2016   Procedure: Coronary Stent Intervention;   Surgeon: Jettie Booze, MD;  Location: [facility];  Service: Cardiovascular;;  . INGUINAL HERNIA REPAIR Left as child    SOCIAL HISTORY:   Social History   Tobacco Use  . Smoking status: Former Smoker    Last attempt to quit: 09/04/2004    Years since quitting: 12.9  . Smokeless tobacco: Never Used  Substance Use Topics  . Alcohol use: No    Alcohol/week: 0.0 oz    FAMILY HISTORY:   Family History  Problem Relation Age of Onset  . Lung cancer Father   . Emphysema Father   . Depression Mother   . CAD Brother   . Heart attack Brother        MI in his 20's    DRUG ALLERGIES:   Allergies  Allergen Reactions  . Levaquin [Levofloxacin In D5w] Nausea And Vomiting  . Penicillins Hives    Childhood allergy. Has patient had a PCN reaction causing immediate rash, facial/tongue/throat swelling, SOB or lightheadedness with hypotension: Unknown Has patient had a PCN reaction causing severe rash involving mucus membranes or skin necrosis: Unknown Has patient had a PCN reaction that required hospitalization: Unknown Has patient had a PCN reaction occurring within the last 10 years: No If all of the above answers are "NO", then may proceed with Cephalosporin use.     REVIEW OF SYSTEMS:   Review of Systems  Constitutional: Positive for malaise/fatigue. Negative for  chills, fever and weight loss.  HENT: Negative for ear discharge, ear pain, hearing loss and nosebleeds.   Eyes: Negative for blurred vision, double vision and photophobia.  Respiratory: Positive for cough and shortness of breath. Negative for hemoptysis and wheezing.   Cardiovascular: Negative for chest pain, palpitations, orthopnea and leg swelling.  Gastrointestinal: Negative for abdominal pain, constipation, diarrhea, heartburn, melena, nausea and vomiting.  Genitourinary: Negative for dysuria, frequency, hematuria and urgency.  Musculoskeletal: Positive for myalgias. Negative for back pain and neck  pain.  Skin: Negative for rash.  Neurological: Negative for dizziness, tingling, sensory change, speech change, focal weakness and headaches.  Endo/Heme/Allergies: Does not bruise/bleed easily.  Psychiatric/Behavioral: Negative for depression.    MEDICATIONS AT HOME:   Prior to Admission medications   Medication Sig Start Date End Date Taking? Authorizing Provider  aspirin EC 81 MG EC tablet Take 1 tablet (81 mg total) by mouth daily. 12/16/15  Yes Regalado, Belkys A, MD  carbidopa-levodopa (SINEMET IR) 25-100 MG tablet Take 1.5 tablets by mouth 3 (three) times daily. 05/21/16  Yes Jaffe, Adam R, DO  losartan (COZAAR) 25 MG tablet Take 1 tablet (25 mg total) by mouth daily. 10/04/16  Yes Dorothy Spark, MD  meclizine (ANTIVERT) 25 MG tablet Take 25 mg by mouth as needed for nausea.   Yes [provider]  metoprolol succinate (TOPROL-XL) 50 MG 24 hr tablet Take 1 tablet (50 mg total) by mouth daily. Take with or immediately following a meal. 01/21/16  Yes Cheryln Manly, NP  nitroGLYCERIN (NITROSTAT) 0.4 MG SL tablet Place 1 tablet (0.4 mg total) under the tongue every 5 (five) minutes x 3 doses as needed for chest pain. 01/21/16  Yes Reino Bellis B, NP  albuterol (PROVENTIL) (2.5 MG/3ML) 0.083% nebulizer solution Take 3 mLs (2.5 mg total) by nebulization 2 (two) times daily. Patient not taking: Reported on 08/09/2017 03/24/16   Pleas Koch, NP  clopidogrel (PLAVIX) 75 MG tablet Take 1 tablet (75 mg total) by mouth daily. Patient not taking: Reported on 08/09/2017 04/14/16   Dorothy Spark, MD  HYDROcodone-acetaminophen [facility]) 5-325 MG tablet Take 1 tablet by mouth every 6 (six) hours as needed for severe pain. Patient not taking: Reported on 08/09/2017 10/02/16   Darel Hong, MD  ondansetron (ZOFRAN ODT) 4 MG disintegrating tablet Take 1 tablet (4 mg total) by mouth every 8 (eight) hours as needed for nausea or vomiting. Patient not taking: Reported on 08/09/2017  10/26/16   Paulette Blanch, MD  oxyCODONE-acetaminophen (ROXICET) 5-325 MG tablet Take 1 tablet by mouth every 4 (four) hours as needed for severe pain. Patient not taking: Reported on 08/09/2017 10/26/16   Paulette Blanch, MD  sertraline (ZOLOFT) 50 MG tablet Take 1 tablet (50 mg total) by mouth daily. Patient not taking: Reported on 08/09/2017 03/24/16   Pleas Koch, NP  tamsulosin (FLOMAX) 0.4 MG CAPS capsule Take 1 capsule (0.4 mg total) by mouth daily. Patient not taking: Reported on 08/09/2017 10/26/16   Paulette Blanch, MD      VITAL SIGNS:  Blood pressure (!) 152/78, pulse (!) 118, temperature 97.8 F (36.6 C), temperature source Oral, resp. rate (!) 34, height 5\' 9"  (1.753 m), weight 68 kg (150 lb), SpO2 96 %.  PHYSICAL EXAMINATION:   Physical Exam  GENERAL:  77 y.o.-year-old patient lying in the bed with no acute distress.  EYES: Pupils equal, round, reactive to light and accommodation. No scleral icterus. Extraocular muscles intact.  HEENT:  Head atraumatic, normocephalic. Oropharynx and nasopharynx clear.  NECK:  Supple, no jugular venous distention. No thyroid enlargement, no tenderness.  LUNGS: pigeon chest, very tight on auscultation, diffuse scattered wheezing, no rales,rhonchi or crepitation. Using accessory muscles of respiration.  CARDIOVASCULAR: S1, S2 normal. No murmurs, rubs, or gallops.  ABDOMEN: Soft, nontender, nondistended. Bowel sounds present. No organomegaly or mass.  EXTREMITIES: No pedal edema, cyanosis, or clubbing.  Unable to palpate dorsalis pedis pulses.  Right hand tremors which are chronic NEUROLOGIC: Cranial nerves II through XII are intact. Muscle strength 5/5 in all extremities. Sensation intact. Gait not checked.  PSYCHIATRIC: The patient is alert and oriented x 3.  SKIN: No obvious rash, lesion, or ulcer.   LABORATORY PANEL:   CBC Recent Labs  Lab 08/09/17 0837  WBC 19.5*  HGB 16.4  HCT 49.0  PLT 245    ------------------------------------------------------------------------------------------------------------------  Chemistries  Recent Labs  Lab 08/09/17 0942  NA 134*  K 4.0  CL 99*  CO2 24  GLUCOSE 147*  BUN 14  CREATININE 0.72  CALCIUM 8.5*  AST 17  ALT 9*  ALKPHOS 88  BILITOT 2.9*   ------------------------------------------------------------------------------------------------------------------  Cardiac Enzymes Recent Labs  Lab 08/09/17 0942  TROPONINI <0.03   ------------------------------------------------------------------------------------------------------------------  RADIOLOGY:  Dg Chest Port 1 View  Result Date: 08/09/2017 CLINICAL DATA:  Shortness of breath EXAM: PORTABLE CHEST 1 VIEW COMPARISON:  June 19, 2016 FINDINGS: There is an apparent nipple shadow on the left. There is no edema or consolidation. Heart size and pulmonary vascularity are normal. No adenopathy. There is aortic atherosclerosis. No bone lesions. IMPRESSION: Apparent nipple shadow on left; repeat study with nipple markers to confirm advised. No edema or consolidation. There is aortic atherosclerosis. Aortic Atherosclerosis (ICD10-I70.0). Electronically Signed   By: Lowella Grip III M.D.   On: 08/09/2017 09:29   Ct Angio Chest/abd/pel For Dissection W And/or Wo Contrast  Result Date: 08/09/2017 CLINICAL DATA:  77 year old male with shortness of breath, cough and chest pain EXAM: CT ANGIOGRAPHY CHEST, ABDOMEN AND PELVIS TECHNIQUE: Multidetector CT imaging through the chest, abdomen and pelvis was performed using the standard protocol during bolus administration of intravenous contrast. Multiplanar reconstructed images and MIPs were obtained and reviewed to evaluate the vascular anatomy. CONTRAST:  119mL ISOVUE-370 IOPAMIDOL (ISOVUE-370) INJECTION 76% COMPARISON:  Prior CT scan of the abdomen and pelvis 10/25/2016 FINDINGS: CTA CHEST FINDINGS Cardiovascular: The initial noncontrast-enhanced  images demonstrate no evidence of acute intramural hematoma. Following the administration of intravenous contrast there is excellent opacification of the thoracic aorta and branch arteries. No evidence of aneurysm or dissection. Conventional 3 vessel arch anatomy. The main pulmonary arteries are also well opacified. No evidence of central pulmonary embolus. The heart is normal in size. Calcifications are visible along the left anterior descending coronary artery. No pericardial effusion. Mediastinum/Nodes: Heterogeneous and hypertrophic left thyroid gland. No mediastinal mass or adenopathy. Lungs/Pleura: No evidence of pneumothorax or pleural effusion. Moderate centrilobular pulmonary emphysema. No suspicious nodule, mass, focal airspace consolidation or evidence of pulmonary edema. Musculoskeletal: No acute fracture or aggressive appearing lytic or blastic osseous lesion. Review of the MIP images confirms the above findings. CTA ABDOMEN AND PELVIS FINDINGS VASCULAR Aorta: Normal caliber abdominal aorta. Scattered calcified atherosclerotic plaque. No dissection or aneurysm. Celiac: Patent without evidence of aneurysm, dissection, vasculitis or significant stenosis. SMA: Patent without evidence of aneurysm, dissection, vasculitis or significant stenosis. Renals: Solitary dominant renal arteries bilaterally. Heterogeneous atherosclerotic plaque results in moderate narrowing of the left renal artery. There is no  significant narrowing in the right renal artery. IMA: Patent without evidence of aneurysm, dissection, vasculitis or significant stenosis. Inflow: Patent without evidence of aneurysm, dissection, vasculitis or significant stenosis. Occlusion of the right superficial femoral artery beginning just beyond the origin. Veins: No obvious venous abnormality within the limitations of this arterial phase study. Review of the MIP images confirms the above findings. NON-VASCULAR Hepatobiliary: Hypoattenuation of the  hepatic parenchyma with relative sparing along the gallbladder fossa consistent with hepatic steatosis. Gallbladder is unremarkable. No intra or extrahepatic biliary ductal dilatation. Pancreas: Unremarkable. No pancreatic ductal dilatation or surrounding inflammatory changes. Spleen: No splenic injury or perisplenic hematoma. Adrenals/Urinary Tract: The adrenal glands are unremarkable. No evidence of enhancing renal mass, hydronephrosis or nephrolithiasis. The ureters and bladder are unremarkable. Stomach/Bowel: Colonic diverticular disease without CT evidence of active inflammation. No evidence of obstruction or focal bowel wall thickening. Normal appendix in the right lower quadrant. The terminal ileum is unremarkable. Lymphatic: No lymphadenopathy. Reproductive: Prostatomegaly. Other: No abdominal wall hernia or abnormality. No abdominopelvic ascites. Musculoskeletal: No acute or significant osseous findings. Review of the MIP images confirms the above findings. IMPRESSION: CTA CHEST 1. No evidence of acute aortic dissection, aneurysm or other acute vascular abnormality. 2. Moderately advanced centrilobular emphysema. 3. Aortic and coronary artery calcifications. 4. Heterogeneous and enlarged left thyroid gland suggests underlying thyroid goiter. CTA ABD/PELVIS 1. No evidence of acute aortic or other vascular abnormality. 2. Scattered atherosclerotic vascular disease including a mild to moderate focal stenosis of the proximal left renal artery and complete occlusion of the right superficial femoral artery. 3. Hepatic steatosis. 4. Colonic diverticular disease without CT evidence of active inflammation. 5. Prostatomegaly. Aortic Atherosclerosis (ICD10-I70.0) and Emphysema (ICD10-J43.9). Signed, Criselda Peaches, MD [facility] Electronically Signed   By: Jacqulynn Cadet M.D.   On: 08/09/2017 11:01    EKG:   Orders placed or performed during the  hospital encounter of 08/09/17  . EKG 12-Lead  . EKG 12-Lead    IMPRESSION AND PLAN:   [name]  is a 77 y.o. male with a known history of COPD not on home oxygen, hypertension, tardive dyskinesia, peripheral vascular disease, CAD status post stent presents to hospital secondary to worsening shortness of breath and cough.  1.  Acute on chronic COPD exacerbation with hypoxia-secondary to COPD exacerbation triggered by allergies -Admit, oxygen support as needed -IV steroids, duo nebs and monitor  2.  Lactic acidosis-could be secondary to hypoxia.  Monitor.  CT abdomen and pelvis without any evidence of infection.   -Could be underlying sepsis, no source identified at this time.  WBC is also elevated. -We will order blood cultures and start antibiotics for community-acquired pneumonia  3.  CAD-stable.  Continue aspirin, Plavix  4.  Peripheral vascular disease-continue outpatient follow-up with vascular.  5.  Tardive dyskinesias-on Sinemet.  6.  DVT prophylaxis-Lovenox  Wife updated at bedside   All the records are reviewed and case discussed with ED provider. Management plans discussed with the patient, family and they are in agreement.  CODE STATUS: Full code  TOTAL TIME TAKING CARE OF THIS PATIENT: 50 minutes.    Mariyanna Mucha M.D on 08/09/2017 at 1:13 PM  Between 7am to 6pm - Pager - [phone]  After 6pm go to [email or web address] - Proofreader  [facility]  Office  [phone]  CC: Primary care physician; Pleas Koch, NP

## 2017-08-09 NOTE — Consult Note (Signed)
Pharmacy Antibiotic Note  [name] is a 77 y.o. male admitted on 08/09/2017 with pneumonia.  Pharmacy has been consulted for ceftriaxone dosing.  Plan: Start ceftriaxone 1g IV every 24 hours.   Height: 5\' 9"  (175.3 cm) Weight: 150 lb (68 kg) IBW/kg (Calculated) : 70.7  Temp (24hrs), Avg:98.2 F (36.8 C), Min:97.8 F (36.6 C), Max:98.5 F (36.9 C)  Recent Labs  Lab 08/09/17 0837 08/09/17 0847 08/09/17 0942 08/09/17 1205  WBC 19.5*  --   --   --   CREATININE  --   --  0.72  --   LATICACIDVEN  --  3.1*  --  4.8*    Estimated Creatinine Clearance: 76.7 mL/min (by C-G formula based on SCr of 0.72 mg/dL).    Allergies  Allergen Reactions  . Levaquin [Levofloxacin In D5w] Nausea And Vomiting  . Penicillins Hives    Childhood allergy. Has patient had a PCN reaction causing immediate rash, facial/tongue/throat swelling, SOB or lightheadedness with hypotension: Unknown Has patient had a PCN reaction causing severe rash involving mucus membranes or skin necrosis: Unknown Has patient had a PCN reaction that required hospitalization: Unknown Has patient had a PCN reaction occurring within the last 10 years: No If all of the above answers are "NO", then may proceed with Cephalosporin use.     Antimicrobials this admission: 4/30 azithromycin >>  4/30 ceftriaxone  >>    Microbiology results: /30  BCx: pending 4/30  UCx: pending   Thank you for allowing pharmacy to be a part of this patient's care.  Pernell Dupre, PharmD, BCPS Clinical Pharmacist 08/09/2017 2:22 PM

## 2017-08-09 NOTE — ED Notes (Signed)
RN unable to take report at this time 

## 2017-08-09 NOTE — Plan of Care (Signed)
Pt admitted today from the ED. VSS. O2 sats in the high 90's on RA. Pt denies pain.

## 2017-08-09 NOTE — ED Provider Notes (Signed)
[facility] Emergency Department Provider Note   ____________________________________________    I have reviewed the triage vital signs and the nursing notes.   HISTORY  Chief Complaint Shortness of Breath     HPI [name] is a 77 y.o. male presents with complaints of cough and shortness of breath.  Wife reports the patient mowed lawn yesterday and afterwards felt nauseated and developed a moderate cough.  Has had a productive cough today and is felt more short of breath.  History of COPD and diabetes.  Positive subjective fever.  No chest pain.  Some abdominal discomfort diffusely, no diarrhea  Past Medical History:  Diagnosis Date  . Chest pain   . COPD (chronic obstructive pulmonary disease) ([place])   . Depression   . Diabetes ([place])   . Dysphagia   . Essential tremor   . Heart disease   . Hyperlipidemia   . Hypertension   . Seizure disorder ([place])   . Tardive dyskinesia   . Vitamin D deficiency     Patient Active Problem List   Diagnosis Date Noted  . COPD exacerbation ([place]) 08/09/2017  . Hypokalemia 06/20/2016  . Syncope   . CAD (coronary artery disease) 06/19/2016  . Claudication ([place]) 06/17/2016  . Status post coronary artery stent placement   . SVT (supraventricular tachycardia) ([place])   . NSVT (nonsustained ventricular tachycardia) ([place])   . NSTEMI (non-ST elevated myocardial infarction) ([place])   . Parkinson's variant of multiple system atrophy ([place]) 12/19/2015  . Encephalopathy chronic 12/14/2015  . Chest pain   . COPD (chronic obstructive pulmonary disease) ([place])   . Heart disease   . Seizure disorder ([place])   . Depression   . Tardive dyskinesia   . Dysphagia   . Skin lesion 08/14/2015  . Shortness of breath 08/14/2015  . Essential hypertension 08/14/2015  . Hyperlipidemia 08/14/2015  . Essential tremor 08/14/2015    Past Surgical History:  Procedure Laterality Date  . CARDIAC CATHETERIZATION N/A 01/19/2016     Procedure: Left Heart Cath and Coronary Angiography;  Surgeon: Jolaine Artist, MD;  Location: [place] [facility];  Service: Cardiovascular;  Laterality: N/A;  . CARDIAC CATHETERIZATION  01/19/2016   Procedure: Coronary Stent Intervention;  Surgeon: Jettie Booze, MD;  Location: [place] CV [facility];  Service: Cardiovascular;;  . INGUINAL HERNIA REPAIR Left as child    Prior to Admission medications   Medication Sig Start Date End Date Taking? Authorizing Provider  aspirin EC 81 MG EC tablet Take 1 tablet (81 mg total) by mouth daily. 12/16/15  Yes Regalado, Belkys A, MD  carbidopa-levodopa (SINEMET IR) 25-100 MG tablet Take 1.5 tablets by mouth 3 (three) times daily. 05/21/16  Yes Jaffe, Adam R, DO  losartan (COZAAR) 25 MG tablet Take 1 tablet (25 mg total) by mouth daily. 10/04/16  Yes Dorothy Spark, MD  meclizine (ANTIVERT) 25 MG tablet Take 25 mg by mouth as needed for nausea.   Yes [provider]  metoprolol succinate (TOPROL-XL) 50 MG 24 hr tablet Take 1 tablet (50 mg total) by mouth daily. Take with or immediately following a meal. 01/21/16  Yes Cheryln Manly, NP  nitroGLYCERIN (NITROSTAT) 0.4 MG SL tablet Place 1 tablet (0.4 mg total) under the tongue every 5 (five) minutes x 3 doses as needed for chest pain. 01/21/16  Yes Reino Bellis B, NP  albuterol (PROVENTIL) (2.5 MG/3ML) 0.083% nebulizer solution Take 3 mLs (2.5 mg total) by nebulization 2 (two)  times daily. Patient not taking: Reported on 08/09/2017 03/24/16   Pleas Koch, NP  clopidogrel (PLAVIX) 75 MG tablet Take 1 tablet (75 mg total) by mouth daily. Patient not taking: Reported on 08/09/2017 04/14/16   Dorothy Spark, MD  HYDROcodone-acetaminophen [facility]) 5-325 MG tablet Take 1 tablet by mouth every 6 (six) hours as needed for severe pain. Patient not taking: Reported on 08/09/2017 10/02/16   Darel Hong, MD  ondansetron (ZOFRAN ODT) 4 MG disintegrating tablet Take 1 tablet (4 mg total) by  mouth every 8 (eight) hours as needed for nausea or vomiting. Patient not taking: Reported on 08/09/2017 10/26/16   Paulette Blanch, MD  oxyCODONE-acetaminophen (ROXICET) 5-325 MG tablet Take 1 tablet by mouth every 4 (four) hours as needed for severe pain. Patient not taking: Reported on 08/09/2017 10/26/16   Paulette Blanch, MD  sertraline (ZOLOFT) 50 MG tablet Take 1 tablet (50 mg total) by mouth daily. Patient not taking: Reported on 08/09/2017 03/24/16   Pleas Koch, NP  tamsulosin (FLOMAX) 0.4 MG CAPS capsule Take 1 capsule (0.4 mg total) by mouth daily. Patient not taking: Reported on 08/09/2017 10/26/16   Paulette Blanch, MD     Allergies Levaquin [levofloxacin in d5w] and Penicillins  Family History  Problem Relation Age of Onset  . Lung cancer Father   . Emphysema Father   . Depression Mother   . CAD Brother   . Heart attack Brother        MI in his 89's    Social History Social History   Tobacco Use  . Smoking status: Former Smoker    Last attempt to quit: 09/04/2004    Years since quitting: 12.9  . Smokeless tobacco: Never Used  Substance Use Topics  . Alcohol use: No    Alcohol/week: 0.0 oz  . Drug use: No    Review of Systems  Constitutional: Objective fever Eyes: No visual changes.  ENT: Mild sore throat Cardiovascular: Denies chest pain. Respiratory: As above Gastrointestinal: As above Genitourinary: Negative for dysuria. Musculoskeletal: Negative for back pain. Skin: Negative for rash. Neurological: Negative for headaches    ____________________________________________   PHYSICAL EXAM:  VITAL SIGNS: ED Triage Vitals [08/09/17 0821]  Enc Vitals Group     BP (!) 149/87     Pulse Rate (!) 124     Resp (!) 22     Temp 97.8 F (36.6 C)     Temp Source Oral     SpO2 95 %     Weight 68 kg (150 lb)     Height 1.753 m (5\' 9" )     Head Circumference      Peak Flow      Pain Score 10     Pain Loc      Pain Edu?      Excl. in [place]?      Constitutional: Alert and oriented.  Hard of hearing Eyes: Conjunctivae are normal.  Head: Atraumatic. Nose: No congestion/rhinnorhea. Mouth/Throat: Mucous membranes are moist.    Cardiovascular: Tachycardia, regular rhythm. Grossly normal heart sounds.  Good peripheral circulation. Respiratory: Increased respiratory effort with tachypnea, scattered mild wheezing Gastrointestinal: Tenderness to palpation right lower quadrant.  No distention.  No CVA tenderness. Genitourinary: deferred Musculoskeletal: No lower extremity tenderness nor edema.  Warm and well perfused Neurologic:  Normal speech and language. No gross focal neurologic deficits are appreciated.  Skin:  Skin is warm, dry.  No areas of cellulitis or crepitus.  Skin  tag on the back which has dislodged and has bled slightly but no evidence of infection Psychiatric: Mood and affect are normal. Speech and behavior are normal.  ____________________________________________   LABS (all labs ordered are listed, but only abnormal results are displayed)  Labs Reviewed  CBC - Abnormal; Notable for the following components:      Result Value   WBC 19.5 (*)    All other components within normal limits  LACTIC ACID, PLASMA - Abnormal; Notable for the following components:   Lactic Acid, Venous 3.1 (*)    All other components within normal limits  LACTIC ACID, PLASMA - Abnormal; Notable for the following components:   Lactic Acid, Venous 4.8 (*)    All other components within normal limits  URINALYSIS, COMPLETE (UACMP) WITH MICROSCOPIC - Abnormal; Notable for the following components:   Color, Urine AMBER (*)    APPearance CLEAR (*)    Specific Gravity, Urine 1.045 (*)    Hgb urine dipstick SMALL (*)    Ketones, ur 20 (*)    Protein, ur 30 (*)    All other components within normal limits  COMPREHENSIVE METABOLIC PANEL - Abnormal; Notable for the following components:   Sodium 134 (*)    Chloride 99 (*)    Glucose, Bld 147  (*)    Calcium 8.5 (*)    ALT 9 (*)    Total Bilirubin 2.9 (*)    All other components within normal limits  CULTURE, BLOOD (ROUTINE X 2)  CULTURE, BLOOD (ROUTINE X 2)  LIPASE, BLOOD  TROPONIN I   ____________________________________________  EKG  ED ECG REPORT I, Lavonia Drafts, the attending physician, personally viewed and interpreted this ECG.  Date: 08/09/2017  Rhythm: Sinus tachycardia QRS Axis: Left axis deviation Intervals: normal ST/T Wave abnormalities: normal Narrative Interpretation: no evidence of acute ischemia  ____________________________________________  RADIOLOGY  Chest x-ray no acute distress CT angiography of the chest abdomen pelvis, no acute distress ____________________________________________   PROCEDURES  Procedure(s) performed: No  Procedures   Critical Care performed: No ____________________________________________   INITIAL IMPRESSION / ASSESSMENT AND PLAN / ED COURSE  Pertinent labs & imaging results that were available during my care of the patient were reviewed by me and considered in my medical decision making (see chart for details).  Patient presents with shortness of breath cough, subjective fever, as well as vague abdominal discomfort.  Differential includes COPD exacerbation, upper respiratory infection, pneumonia, pending labs we will give IV morphine IV Zofran given abdominal pain as well.  ----------------------------------------- 11:43 AM on 08/09/2017 -----------------------------------------  On reexam no abdominal tenderness to palpation.  CT angiography is reassuring, no evidence of pneumonia.  Elevated lactic acid and white blood cell count but thus far no evidence of infection.  Not consistent with ischemic colitis as abdominal exam is now benign.  Worsening wheezes, will add Solu-Medrol and duo nebs  ----------------------------------------- 1:11 PM on 08/09/2017 ----------------------------------------- Will  cover broadly with antibiotics given white blood cell count and lactic acid and tachycardia although no clear source of infection, doubt sepsis, suspect elevated white blood cell count and lactic acid related to increased work of breathing from COPD exacerbation     ____________________________________________   FINAL CLINICAL IMPRESSION(S) / ED DIAGNOSES  Final diagnoses:  COPD exacerbation ([place])        Note:  This document was prepared using Dragon voice recognition software and may include unintentional dictation errors.    Lavonia Drafts, MD 08/09/17 1314

## 2017-08-09 NOTE — ED Triage Notes (Signed)
Pt to ED via POV c/o shortness of breath. Pt states that he has been coughing since last Wednesday and has had a sore throat since Thursday. Pt states that he has been getting worse since then. Pt has hx/o COPD. Pt is in mild distress at this time.

## 2017-08-10 DIAGNOSIS — I2510 Atherosclerotic heart disease of native coronary artery without angina pectoris: Secondary | ICD-10-CM | POA: Diagnosis not present

## 2017-08-10 DIAGNOSIS — J441 Chronic obstructive pulmonary disease with (acute) exacerbation: Secondary | ICD-10-CM | POA: Diagnosis not present

## 2017-08-10 DIAGNOSIS — E872 Acidosis: Secondary | ICD-10-CM | POA: Diagnosis not present

## 2017-08-10 DIAGNOSIS — I739 Peripheral vascular disease, unspecified: Secondary | ICD-10-CM | POA: Diagnosis not present

## 2017-08-10 LAB — BASIC METABOLIC PANEL
ANION GAP: 9 (ref 5–15)
BUN: 14 mg/dL (ref 6–20)
CALCIUM: 8.4 mg/dL — AB (ref 8.9–10.3)
CHLORIDE: 104 mmol/L (ref 101–111)
CO2: 23 mmol/L (ref 22–32)
CREATININE: 0.97 mg/dL (ref 0.61–1.24)
GFR calc non Af Amer: >60 mL/min (ref >60)
Glucose, Bld: 261 mg/dL — ABNORMAL HIGH (ref 65–99)
Potassium: 3.6 mmol/L (ref 3.5–5.1)
SODIUM: 136 mmol/L (ref 135–145)

## 2017-08-10 LAB — CBC
HCT: 38.1 % — ABNORMAL LOW (ref 40.0–52.0)
Hemoglobin: 13 g/dL (ref 13.0–18.0)
MCH: 28.6 pg (ref 26.0–34.0)
MCHC: 34 g/dL (ref 32.0–36.0)
MCV: 84 fL (ref 80.0–100.0)
Platelets: 176 10*3/uL (ref 150–440)
RBC: 4.53 MIL/uL (ref 4.40–5.90)
RDW: 14 % (ref 11.5–14.5)
WBC: 11 10*3/uL — ABNORMAL HIGH (ref 3.8–10.6)

## 2017-08-10 LAB — LACTIC ACID, PLASMA
Lactic Acid, Venous: 5.6 mmol/L (ref 0.5–1.9)
Lactic Acid, Venous: 5.7 mmol/L (ref 0.5–1.9)
Lactic Acid, Venous: 4.6 mmol/L (ref 0.5–1.9)

## 2017-08-10 MED ORDER — IPRATROPIUM-ALBUTEROL 0.5-2.5 (3) MG/3ML IN SOLN: 3.0000 mL | Freq: Four times a day (QID) | RESPIRATORY_TRACT | Status: DC | PRN

## 2017-08-10 MED ORDER — AZITHROMYCIN 500 MG PO TABS: 500.0000 mg | ORAL_TABLET | Freq: Every day | ORAL | 0 refills | Status: AC

## 2017-08-10 MED ORDER — IPRATROPIUM-ALBUTEROL 0.5-2.5 (3) MG/3ML IN SOLN
3.0000 mL | Freq: Four times a day (QID) | RESPIRATORY_TRACT | Status: DC
  Filled 2017-08-10: qty 3

## 2017-08-10 MED ORDER — PREDNISONE 10 MG (21) PO TBPK: ORAL_TABLET | ORAL | 0 refills | Status: DC

## 2017-08-10 NOTE — Care Management Obs Status (Signed)
[facility] NOTIFICATION   Patient Details  Name: Grayton Lobo MRN: [id number] Date of Birth: December 27, 1940   Medicare Observation Status Notification Given:  Yes    Katrina Stack, RN 08/10/2017, 10:29 AM

## 2017-08-10 NOTE — Discharge Instructions (Signed)

## 2017-08-10 NOTE — Care Management (Signed)
Patient placed in observation after code 107 for exac of copd. No chronic home 02 and currently on room air.  His lactic acid is trending up and orders present for repeat

## 2017-08-10 NOTE — Plan of Care (Signed)
Pt is d/ced home.  D/c was delayed for a time b/c lactic acid was critical level.  Pt had no other symptoms. He's been afebrile, A&O, HR and BP at baseline.  Unable to determine why elevated other than he has chronic shortness of breath.  Labs done every three hours and it finally came down to 4.6.  Pt ready to go home.  Satting 98% on RA.  IV removed.  D/c instructions reviewed along w/f/u appts.  Pt will transport home with wife.

## 2017-08-10 NOTE — Care Management CC44 (Signed)
Condition Code 44 Documentation Completed  Patient Details  Name: [name] MRN: [id number] Date of Birth: [birth year]   Condition Code 44 given:  Yes Patient signature on Condition Code 44 notice:  Yes Documentation of 2 MD's agreement:  Yes Code 44 added to claim:  Yes    Katrina Stack, RN 08/10/2017, 10:29 AM

## 2017-08-11 ENCOUNTER — Telehealth: Payer: Self-pay | Admitting: *Deleted

## 2017-08-11 NOTE — Telephone Encounter (Signed)
Transition Care Management Follow-up Telephone Call COMPLETED BY PTS WIFE [name]-authorized on DPR   Date discharged? 08/10/2017   How have you been since you were released from the hospital? "hes been ok"   Do you understand why you were in the hospital? yes   Do you understand the discharge instructions? yes   Where were you discharged to? Home   Items Reviewed:  Medications reviewed: yes; pt given two abx  Allergies reviewed: yes  Dietary changes reviewed: yes  Referrals reviewed: yes   Functional Questionnaire:  Activities of Daily Living (ADLs):   He states they are independent in the following: ambulation, bathing and hygiene, feeding, continence, grooming and toileting States they require assistance with the following: bathing and hygiene, feeding, continence, grooming, toileting and dressing  ASSISTED BY WIFE   Any transportation issues/concerns?: no   Any patient concerns? no   Confirmed importance and date/time of  follow-up visits scheduled yes  Provider Appointment booked with [facility] 5/6   Confirmed with patient if condition begins to worsen call PCP or go to the ER.  Patient was given the office number and encouraged to call back with question or concerns.  : yes

## 2017-08-13 NOTE — Discharge Summary (Signed)
[facility] [place] NAME: [name]    MR#:  [id number]  DATE OF BIRTH:  12-29-1940  DATE OF ADMISSION:  08/09/2017   ADMITTING PHYSICIAN: Gladstone Lighter, MD  DATE OF DISCHARGE: 08/10/2017  5:42 PM  PRIMARY CARE PHYSICIAN: Pleas Koch, NP   ADMISSION DIAGNOSIS:  COPD exacerbation ([place]) [J44.1] DISCHARGE DIAGNOSIS:  Active Problems:   COPD exacerbation ([place])  SECONDARY DIAGNOSIS:   Past Medical History:  Diagnosis Date  . Chest pain   . COPD (chronic obstructive pulmonary disease) ([place])   . Depression   . Diabetes ([place])   . Dysphagia   . Essential tremor   . Heart disease   . Hyperlipidemia   . Hypertension   . Seizure disorder ([place])   . Tardive dyskinesia   . Vitamin D deficiency    HOSPITAL COURSE:  [name]  is a 77 y.o. male with a known history of COPD not on home oxygen, hypertension, tardive dyskinesia, peripheral vascular disease, CAD status post stent presents to hospital secondary to worsening shortness of breath and cough.  1.  Acute on chronic COPD exacerbation with hypoxia-secondary to COPD exacerbation triggered by allergies -improved with steroids, duo nebs   2.  Lactic acidosis-could be secondary to hypoxia. Trending down  3.  CAD-stable.  Continue aspirin, Plavix  4.  Peripheral vascular disease-continue outpatient follow-up with vascular.  5.  Tardive dyskinesias-on Sinemet. DISCHARGE CONDITIONS:  stable CONSULTS OBTAINED:   DRUG ALLERGIES:   Allergies  Allergen Reactions  . Levaquin [Levofloxacin In D5w] Nausea And Vomiting  . Penicillins Hives    Childhood allergy. Has patient had a PCN reaction causing immediate rash, facial/tongue/throat swelling, SOB or lightheadedness with hypotension: Unknown Has patient had a PCN reaction causing severe rash involving mucus membranes or skin necrosis: Unknown Has patient had a PCN reaction that required hospitalization:  Unknown Has patient had a PCN reaction occurring within the last 10 years: No If all of the above answers are "NO", then may proceed with Cephalosporin use.    DISCHARGE MEDICATIONS:   Allergies as of 08/10/2017      Reactions   Levaquin [levofloxacin In D5w] Nausea And Vomiting   Penicillins Hives   Childhood allergy. Has patient had a PCN reaction causing immediate rash, facial/tongue/throat swelling, SOB or lightheadedness with hypotension: Unknown Has patient had a PCN reaction causing severe rash involving mucus membranes or skin necrosis: Unknown Has patient had a PCN reaction that required hospitalization: Unknown Has patient had a PCN reaction occurring within the last 10 years: No If all of the above answers are "NO", then may proceed with Cephalosporin use.      Medication List    STOP taking these medications   albuterol (2.5 MG/3ML) 0.083% nebulizer solution Commonly known as:  PROVENTIL   clopidogrel 75 MG tablet Commonly known as:  PLAVIX   HYDROcodone-acetaminophen 5-325 MG tablet Commonly known as:  NORCO   ondansetron 4 MG disintegrating tablet Commonly known as:  ZOFRAN ODT   oxyCODONE-acetaminophen 5-325 MG tablet Commonly known as:  ROXICET   sertraline 50 MG tablet Commonly known as:  ZOLOFT   tamsulosin 0.4 MG Caps capsule Commonly known as:  FLOMAX     TAKE these medications   aspirin 81 MG EC tablet Take 1 tablet (81 mg total) by mouth daily.   azithromycin 500 MG tablet Commonly known as:  ZITHROMAX Take 1 tablet (500 mg total) by mouth daily for 3 days.  Take 1 tablet daily for 3 days.   carbidopa-levodopa 25-100 MG tablet Commonly known as:  SINEMET IR Take 1.5 tablets by mouth 3 (three) times daily.   losartan 25 MG tablet Commonly known as:  COZAAR Take 1 tablet (25 mg total) by mouth daily.   meclizine 25 MG tablet Commonly known as:  ANTIVERT Take 25 mg by mouth as needed for nausea.   metoprolol succinate 50 MG 24 hr  tablet Commonly known as:  TOPROL-XL Take 1 tablet (50 mg total) by mouth daily. Take with or immediately following a meal.   nitroGLYCERIN 0.4 MG SL tablet Commonly known as:  NITROSTAT Place 1 tablet (0.4 mg total) under the tongue every 5 (five) minutes x 3 doses as needed for chest pain.   predniSONE 10 MG (21) Tbpk tablet Commonly known as:  STERAPRED UNI-PAK 21 TAB Start 60 mg once daily, taper 10 mg daily until done        DISCHARGE INSTRUCTIONS:   DIET:  Regular diet DISCHARGE CONDITION:  Good ACTIVITY:  Activity as tolerated OXYGEN:  Home Oxygen: No.  Oxygen Delivery: room air DISCHARGE LOCATION:  home   If you experience worsening of your admission symptoms, develop shortness of breath, life threatening emergency, suicidal or homicidal thoughts you must seek medical attention immediately by calling 911 or calling your MD immediately  if symptoms less severe.  You Must read complete instructions/literature along with all the possible adverse reactions/side effects for all the Medicines you take and that have been prescribed to you. Take any new Medicines after you have completely understood and accpet all the possible adverse reactions/side effects.   Please note  You were cared for by a hospitalist during your hospital stay. If you have any questions about your discharge medications or the care you received while you were in the hospital after you are discharged, you can call the unit and asked to speak with the hospitalist on call if the hospitalist that took care of you is not available. Once you are discharged, your primary care physician will handle any further medical issues. Please note that NO REFILLS for any discharge medications will be authorized once you are discharged, as it is imperative that you return to your primary care physician (or establish a relationship with a primary care physician if you do not have one) for your aftercare needs so that they can  reassess your need for medications and monitor your lab values.    On the day of Discharge:  VITAL SIGNS:  Blood pressure (!) 142/77, pulse 100, temperature (!) 97.3 F (36.3 C), temperature source Oral, resp. rate 20, height 5\' 9"  (1.753 m), weight 68 kg (150 lb), SpO2 96 %. PHYSICAL EXAMINATION:  GENERAL:  77 y.o.-year-old patient lying in the bed with no acute distress.  EYES: Pupils equal, round, reactive to light and accommodation. No scleral icterus. Extraocular muscles intact.  HEENT: Head atraumatic, normocephalic. Oropharynx and nasopharynx clear.  NECK:  Supple, no jugular venous distention. No thyroid enlargement, no tenderness.  LUNGS: Normal breath sounds bilaterally, no wheezing, rales,rhonchi or crepitation. No use of accessory muscles of respiration.  CARDIOVASCULAR: S1, S2 normal. No murmurs, rubs, or gallops.  ABDOMEN: Soft, non-tender, non-distended. Bowel sounds present. No organomegaly or mass.  EXTREMITIES: No pedal edema, cyanosis, or clubbing.  NEUROLOGIC: Cranial nerves II through XII are intact. Muscle strength 5/5 in all extremities. Sensation intact. Gait not checked.  PSYCHIATRIC: The patient is alert and oriented x 3.  SKIN: No  obvious rash, lesion, or ulcer.  DATA REVIEW:   CBC Recent Labs  Lab 08/10/17 0545  WBC 11.0*  HGB 13.0  HCT 38.1*  PLT 176    Chemistries  Recent Labs  Lab 08/09/17 0942 08/10/17 0545  NA 134* 136  K 4.0 3.6  CL 99* 104  CO2 24 23  GLUCOSE 147* 261*  BUN 14 14  CREATININE 0.72 0.97  CALCIUM 8.5* 8.4*  AST 17  --   ALT 9*  --   ALKPHOS 88  --   BILITOT 2.9*  --      Follow-up Information    Pleas Koch, NP. Go on 08/15/2017.   Specialty:  Internal Medicine Why:  @ 11:30am Contact information: [place] [address] [place], [place], NP. Go on 08/15/2017.   Specialty:  Neurology Why:  @ 2:30pm Contact information: [place]  [id number] [phone]           Management plans discussed with the patient, family and they are in agreement.  CODE STATUS: Prior   TOTAL TIME TAKING CARE OF THIS PATIENT: 45 minutes.    Max Sane M.D on 08/13/2017 at 10:31 AM  Between 7am to 6pm - Pager - [phone]  After 6pm go to [email or web address] - Proofreader  [facility]  Office  [phone]  CC: Primary care physician; Pleas Koch, NP   Note: This dictation was prepared with Dragon dictation along with smaller phrase technology. Any transcriptional errors that result from this process are unintentional.

## 2017-08-14 LAB — CULTURE, BLOOD (ROUTINE X 2)
CULTURE: NO GROWTH
CULTURE: NO GROWTH
Culture: NO GROWTH
Culture: NO GROWTH
SPECIAL REQUESTS: ADEQUATE
SPECIAL REQUESTS: ADEQUATE
Special Requests: ADEQUATE
Special Requests: ADEQUATE

## 2017-08-15 ENCOUNTER — Ambulatory Visit (INDEPENDENT_AMBULATORY_CARE_PROVIDER_SITE_OTHER): Payer: PPO | Admitting: Primary Care

## 2017-08-15 ENCOUNTER — Encounter: Payer: Self-pay | Admitting: Primary Care

## 2017-08-15 VITALS — BP 140/92 | HR 72 | Temp 97.7°F | Ht 69.0 in | Wt 148.0 lb

## 2017-08-15 DIAGNOSIS — D72829 Elevated white blood cell count, unspecified: Secondary | ICD-10-CM

## 2017-08-15 DIAGNOSIS — I10 Essential (primary) hypertension: Secondary | ICD-10-CM | POA: Diagnosis not present

## 2017-08-15 DIAGNOSIS — J441 Chronic obstructive pulmonary disease with (acute) exacerbation: Secondary | ICD-10-CM | POA: Diagnosis not present

## 2017-08-15 DIAGNOSIS — R739 Hyperglycemia, unspecified: Secondary | ICD-10-CM | POA: Diagnosis not present

## 2017-08-15 LAB — CBC
HCT: 47.9 % (ref 39.0–52.0)
Hemoglobin: 16.3 g/dL (ref 13.0–17.0)
MCHC: 34 g/dL (ref 30.0–36.0)
MCV: 83.2 fl (ref 78.0–100.0)
PLATELETS: 330 10*3/uL (ref 150.0–400.0)
RBC: 5.75 Mil/uL (ref 4.22–5.81)
RDW: 14 % (ref 11.5–15.5)
WBC: 12 10*3/uL — ABNORMAL HIGH (ref 4.0–10.5)

## 2017-08-15 LAB — HEMOGLOBIN A1C: Hgb A1c MFr Bld: 6 % (ref 4.6–6.5)

## 2017-08-15 NOTE — Assessment & Plan Note (Signed)
Above goal on visit today. Will have him start monitoring at home and report readings at or above 140/90. Continue metoprolol succinate and losartan as prescribed.

## 2017-08-15 NOTE — Progress Notes (Signed)
Subjective:    Patient ID: [name], male    DOB: [birth year], 77 y.o.   MRN: [id number]  HPI  [name] is a 77 year old male who presents today for hospital follow up.  He presented to [facility] ED on 08/09/17 with a chief complaint of cough and shortness of breath. His symptoms began after mowing the lawn the day prior. He also endorsed abdominal pain.   During his stay in the ED he underwent chest xray and CT angio of the chest which were without evidence of pneumonia. He was treated with IV morphine and zofran given abdominal pain. His wheezing progressed so he was treated with IV solu-medrol. His labs showed leukocytosis and an elevated lactic acid, heart rate with sinus tachycardia. There was no clear sign of infection, but was admitted due to increased respiratory effort and likely COPD exacerbation.   During his hospital stay he was treated with oxygen, IV steroids, nebulized treatments. He underwent CT abdomen/pelvis which was unremarkable. His elevated lactic acid level was considered secondary to hypoxia and did trend downward. He improved with nebulized breathing treatments and IV steroids so he was discharged home on 08/10/17 with prescriptions for azithromycin and prednisone.  Since his discharge home he's doing "so-so". He has a mole on the left posterior upper thoracic trunk that snagged during his stay. This bleed quite a bit so a bandage was applied. He's been using his nebulized breathing treatments twice daily at home. He has finished his azithromycin and will be done with prednisone tomorrow. He has an appointment with his pulmonologist this afternoon. He's not checking his blood pressure at home.   BP Readings from Last 3 Encounters:  08/15/17 (!) 140/92  08/10/17 (!) 142/77  10/26/16 (!) 194/88     Review of Systems  Constitutional: Negative for chills and fever.  HENT: Negative for congestion.   Respiratory: Positive for shortness of breath and wheezing.  Negative for cough.   Cardiovascular: Negative for chest pain.  Gastrointestinal: Negative for abdominal pain.  Allergic/Immunologic: Positive for environmental allergies.       Past Medical History:  Diagnosis Date  . Chest pain   . COPD (chronic obstructive pulmonary disease) ([place])   . Depression   . Diabetes ([place])   . Dysphagia   . Essential tremor   . Heart disease   . Hyperlipidemia   . Hypertension   . Seizure disorder ([place])   . Tardive dyskinesia   . Vitamin D deficiency      Social History   Socioeconomic History  . Marital status: Married    Spouse name: Not on file  . Number of children: Not on file  . Years of education: Not on file  . Highest education level: Not on file  Occupational History  . Not on file  Social Needs  . Financial resource strain: Not on file  . Food insecurity:    Worry: Not on file    Inability: Not on file  . Transportation needs:    Medical: Not on file    Non-medical: Not on file  Tobacco Use  . Smoking status: Former Smoker    Last attempt to quit: 09/04/2004    Years since quitting: 12.9  . Smokeless tobacco: Never Used  Substance and Sexual Activity  . Alcohol use: No    Alcohol/week: 0.0 oz  . Drug use: No  . Sexual activity: Not on file  Lifestyle  . Physical activity:    Days per  week: Not on file    Minutes per session: Not on file  . Stress: Not on file  Relationships  . Social connections:    Talks on phone: Not on file    Gets together: Not on file    Attends religious service: Not on file    Active member of club or organization: Not on file    Attends meetings of clubs or organizations: Not on file    Relationship status: Not on file  . Intimate partner violence:    Fear of current or ex partner: Not on file    Emotionally abused: Not on file    Physically abused: Not on file    Forced sexual activity: Not on file  Other Topics Concern  . Not on file  Social History Narrative   Married.   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   Enjoys spending time with family.     Past Surgical History:  Procedure Laterality Date  . CARDIAC CATHETERIZATION N/A 01/19/2016   Procedure: Left Heart Cath and Coronary Angiography;  Surgeon: Jolaine Artist, MD;  Location: [facility];  Service: Cardiovascular;  Laterality: N/A;  . CARDIAC CATHETERIZATION  01/19/2016   Procedure: Coronary Stent Intervention;  Surgeon: Jettie Booze, MD;  Location: [facility];  Service: Cardiovascular;;  . INGUINAL HERNIA REPAIR Left as child    Family History  Problem Relation Age of Onset  . Lung cancer Father   . Emphysema Father   . Depression Mother   . CAD Brother   . Heart attack Brother        MI in his 20's    Allergies  Allergen Reactions  . Levaquin [Levofloxacin In D5w] Nausea And Vomiting  . Penicillins Hives    Childhood allergy. Has patient had a PCN reaction causing immediate rash, facial/tongue/throat swelling, SOB or lightheadedness with hypotension: Unknown Has patient had a PCN reaction causing severe rash involving mucus membranes or skin necrosis: Unknown Has patient had a PCN reaction that required hospitalization: Unknown Has patient had a PCN reaction occurring within the last 10 years: No If all of the above answers are "NO", then may proceed with Cephalosporin use.     Current Outpatient Medications on File Prior to Visit  Medication Sig Dispense Refill  . aspirin EC 81 MG EC tablet Take 1 tablet (81 mg total) by mouth daily. 30 tablet 0  . carbidopa-levodopa (SINEMET IR) 25-100 MG tablet Take 1.5 tablets by mouth 3 (three) times daily. 150 tablet 2  . losartan (COZAAR) 25 MG tablet Take 1 tablet (25 mg total) by mouth daily. 30 tablet 8  . meclizine (ANTIVERT) 25 MG tablet Take 25 mg by mouth as needed for nausea.    . metoprolol succinate (TOPROL-XL) 50 MG 24 hr tablet Take 1 tablet (50 mg total) by mouth daily. Take with or immediately following a meal.  30 tablet 6  . nitroGLYCERIN (NITROSTAT) 0.4 MG SL tablet Place 1 tablet (0.4 mg total) under the tongue every 5 (five) minutes x 3 doses as needed for chest pain. 25 tablet 3  . predniSONE (STERAPRED UNI-PAK 21 TAB) 10 MG (21) TBPK tablet Start 60 mg once daily, taper 10 mg daily until done 21 tablet 0   No current facility-administered medications on file prior to visit.     BP (!) 140/92   Pulse 72   Temp 97.7 F (36.5 C) (Oral)   Ht 5\' 9"  (1.753 m)  Wt 148 lb (67.1 kg)   SpO2 99%   BMI 21.86 kg/m    Objective:   Physical Exam  Constitutional: He is oriented to person, place, and time. He appears well-nourished.  Cardiovascular: Normal rate and regular rhythm.  Pulmonary/Chest: Effort normal. No respiratory distress. He has no decreased breath sounds. He has wheezes in the right upper field and the left lower field. He has no rhonchi.  Abdominal: Soft.  Neurological: He is alert and oriented to person, place, and time.  Skin:     1.5 cm rounded, raised, light brown nevus to left posterior trunk, upper thoracic region. Non tender, no erythema. Attached with small flap of skin to surface.           Assessment & Plan:

## 2017-08-15 NOTE — Patient Instructions (Signed)
Stop by the lab prior to leaving today. I will notify you of your results once received.   Monitor your blood pressure levels and notify me if they run at or above 140/90 consistently.  Follow up with your pulmonologist as scheduled.  It was a pleasure to see you today!

## 2017-08-15 NOTE — Assessment & Plan Note (Signed)
Admitted for COPD exacerbation on 08/09/17. All imaging negative for acute infection. Exam today with mild wheezing, no respiratory distress, appears well.  Mole is intact, will have them protect with dressings and change daily. No s/s of infection. Wife will monitor. Consider removal in the future. Repeat CBC pending today. WBC may be elevated today given steroid use.  Continue nebulized breathing treatments.  Follow up with pulmonology as scheduled.  All hospital labs, imaging, notes reviewed.

## 2017-08-23 ENCOUNTER — Encounter: Payer: Self-pay | Admitting: *Deleted

## 2017-08-29 DIAGNOSIS — R251 Tremor, unspecified: Secondary | ICD-10-CM | POA: Diagnosis not present

## 2017-08-29 DIAGNOSIS — M79605 Pain in left leg: Secondary | ICD-10-CM | POA: Diagnosis not present

## 2017-08-29 DIAGNOSIS — M79604 Pain in right leg: Secondary | ICD-10-CM | POA: Diagnosis not present

## 2017-08-29 DIAGNOSIS — R262 Difficulty in walking, not elsewhere classified: Secondary | ICD-10-CM | POA: Diagnosis not present

## 2017-09-12 ENCOUNTER — Encounter (INDEPENDENT_AMBULATORY_CARE_PROVIDER_SITE_OTHER): Payer: PPO | Admitting: Vascular Surgery

## 2017-09-13 ENCOUNTER — Ambulatory Visit (INDEPENDENT_AMBULATORY_CARE_PROVIDER_SITE_OTHER): Payer: PPO | Admitting: Vascular Surgery

## 2017-09-13 ENCOUNTER — Encounter (INDEPENDENT_AMBULATORY_CARE_PROVIDER_SITE_OTHER): Payer: Self-pay | Admitting: Vascular Surgery

## 2017-09-13 VITALS — BP 158/96 | HR 105 | Resp 17 | Ht 69.0 in | Wt 145.0 lb

## 2017-09-13 DIAGNOSIS — I70219 Atherosclerosis of native arteries of extremities with intermittent claudication, unspecified extremity: Secondary | ICD-10-CM

## 2017-09-13 DIAGNOSIS — I10 Essential (primary) hypertension: Secondary | ICD-10-CM | POA: Diagnosis not present

## 2017-09-13 DIAGNOSIS — E785 Hyperlipidemia, unspecified: Secondary | ICD-10-CM

## 2017-09-13 NOTE — Progress Notes (Signed)
Subjective:    Patient ID: [name], male    DOB: [birth year], 77 y.o.   MRN: [id number] Chief Complaint  Patient presents with  . New Patient (Initial Visit)    Bilateral lower extremity pain   Presents as a new patient referred by NP Stepp for evaluation of bilateral lower extremity pain.  The patient notes progressively worsening claudication-like symptoms to the bilateral legs.  The patient notes bilateral calf cramping associated with activity which has progressively worsened over the last few months.  The patient has noticed a shortening in his claudication distance and states that his discomfort is 10 out of 10.  The patient's discomfort will resolve when he stops the activity.  It will quickly return when he resumes the activity.  The patient denies any rest pain or ulcer formation to the bilateral lower extremity.  Patient denies any recent surgery or trauma to the bilateral lower extremity.  Patient denies any fever, nausea vomiting.  Review of Systems  Constitutional: Negative.   HENT: Negative.   Eyes: Negative.   Respiratory: Negative.   Cardiovascular:       Bilateral Lower Extremity Pain  Gastrointestinal: Negative.   Endocrine: Negative.   Genitourinary: Negative.   Musculoskeletal: Negative.   Skin: Negative.   Allergic/Immunologic: Negative.   Neurological: Negative.   Hematological: Negative.   Psychiatric/Behavioral: Negative.       Objective:   Physical Exam  Constitutional: He is oriented to person, place, and time. He appears well-developed and well-nourished. No distress.  HENT:  Head: Normocephalic and atraumatic.  Right Ear: External ear normal.  Left Ear: External ear normal.  Eyes: Pupils are equal, round, and reactive to light. Conjunctivae and EOM are normal.  Neck: Normal range of motion.  Cardiovascular: Normal rate, regular rhythm, normal heart sounds and intact distal pulses.  Pulses:      Radial pulses are 2+ on the right side,  and 2+ on the left side.  Hard to palpate pedal pulses however the bilateral feet are warm  Pulmonary/Chest: Effort normal and breath sounds normal.  Musculoskeletal: Normal range of motion. He exhibits no edema.  Neurological: He is alert and oriented to person, place, and time.  Skin: Skin is warm and dry. He is not diaphoretic.  Psychiatric: He has a normal mood and affect. His behavior is normal. Judgment and thought content normal.  Vitals reviewed.  BP (!) 158/96 (BP Location: Right Arm, Patient Position: Sitting)   Pulse (!) 105   Resp 17   Ht 5\' 9"  (1.753 m)   Wt 145 lb (65.8 kg)   BMI 21.41 kg/m   Past Medical History:  Diagnosis Date  . Chest pain   . COPD (chronic obstructive pulmonary disease) ([place])   . Depression   . Diabetes ([place])   . Dysphagia   . Essential tremor   . Heart disease   . Hyperlipidemia   . Hypertension   . Seizure disorder ([place])   . Tardive dyskinesia   . Vitamin D deficiency    Social History   Socioeconomic History  . Marital status: Married    Spouse name: Not on file  . Number of children: Not on file  . Years of education: Not on file  . Highest education level: Not on file  Occupational History  . Not on file  Social Needs  . Financial resource strain: Not on file  . Food insecurity:    Worry: Not on file    Inability: Not  on file  . Transportation needs:    Medical: Not on file    Non-medical: Not on file  Tobacco Use  . Smoking status: Former Smoker    Last attempt to quit: 09/04/2004    Years since quitting: 13.0  . Smokeless tobacco: Never Used  Substance and Sexual Activity  . Alcohol use: No    Alcohol/week: 0.0 oz  . Drug use: No  . Sexual activity: Not on file  Lifestyle  . Physical activity:    Days per week: Not on file    Minutes per session: Not on file  . Stress: Not on file  Relationships  . Social connections:    Talks on phone: Not on file    Gets together: Not on file    Attends religious service:  Not on file    Active member of club or organization: Not on file    Attends meetings of clubs or organizations: Not on file    Relationship status: Not on file  . Intimate partner violence:    Fear of current or ex partner: Not on file    Emotionally abused: Not on file    Physically abused: Not on file    Forced sexual activity: Not on file  Other Topics Concern  . Not on file  Social History Narrative   Married.   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   Enjoys spending time with family.    Past Surgical History:  Procedure Laterality Date  . CARDIAC CATHETERIZATION N/A 01/19/2016   Procedure: Left Heart Cath and Coronary Angiography;  Surgeon: Jolaine Artist, MD;  Location: [facility];  Service: Cardiovascular;  Laterality: N/A;  . CARDIAC CATHETERIZATION  01/19/2016   Procedure: Coronary Stent Intervention;  Surgeon: Jettie Booze, MD;  Location: [facility];  Service: Cardiovascular;;  . INGUINAL HERNIA REPAIR Left as child   Family History  Problem Relation Age of Onset  . Lung cancer Father   . Emphysema Father   . Depression Mother   . CAD Brother   . Heart attack Brother        MI in his 20's   Allergies  Allergen Reactions  . Levaquin [Levofloxacin In D5w] Nausea And Vomiting  . Penicillins Hives    Childhood allergy. Has patient had a PCN reaction causing immediate rash, facial/tongue/throat swelling, SOB or lightheadedness with hypotension: Unknown Has patient had a PCN reaction causing severe rash involving mucus membranes or skin necrosis: Unknown Has patient had a PCN reaction that required hospitalization: Unknown Has patient had a PCN reaction occurring within the last 10 years: No If all of the above answers are "NO", then may proceed with Cephalosporin use.       Assessment & Plan:  Presents as a new patient referred by NP Stepp for evaluation of bilateral lower extremity pain.  The patient notes progressively worsening  claudication-like symptoms to the bilateral legs.  The patient notes bilateral calf cramping associated with activity which has progressively worsened over the last few months.  The patient has noticed a shortening in his claudication distance and states that his discomfort is 10 out of 10.  The patient's discomfort will resolve when he stops the activity.  It will quickly return when he resumes the activity.  The patient denies any rest pain or ulcer formation to the bilateral lower extremity.  Patient denies any recent surgery or trauma to the bilateral lower extremity.  Patient denies any fever, nausea vomiting.  1. Atherosclerosis of lower extremity with claudication ([place]) - New Patient with multiple risk factors for peripheral artery disease Progressively worsening claudication-like symptoms to the bilateral calves with activity Hard to palpate pedal pulses on exam I will order a bilateral ABI to assess for any contributing peripheral artery disease I have discussed with the patient at length the risk factors for and pathogenesis of atherosclerotic disease and encouraged a healthy diet, regular exercise regimen and blood pressure / glucose control.  The patient was encouraged to call the office in the interim if he experiences any claudication like symptoms, rest pain or ulcers to his feet / toes.  - US ARTERIAL ABI (SCREENING LOWER EXTREMITY); Future  2. Essential hypertension - Stable Encouraged good control as its slows the progression of atherosclerotic disease  3. Hyperlipidemia, unspecified hyperlipidemia type - Stable Encouraged good control as its slows the progression of atherosclerotic disease  Current Outpatient Medications on File Prior to Visit  Medication Sig Dispense Refill  . aspirin EC 81 MG EC tablet Take 1 tablet (81 mg total) by mouth daily. 30 tablet 0  . carbidopa-levodopa (SINEMET IR) 25-100 MG tablet Take 1.5 tablets by mouth 3 (three) times daily. (Patient taking  differently: Take by mouth 3 (three) times daily. ) 150 tablet 2  . losartan (COZAAR) 25 MG tablet Take 1 tablet (25 mg total) by mouth daily. 30 tablet 8  . traMADol (ULTRAM) 50 MG tablet Take by mouth.    . meclizine (ANTIVERT) 25 MG tablet Take 25 mg by mouth as needed for nausea.    . metoprolol succinate (TOPROL-XL) 50 MG 24 hr tablet Take 1 tablet (50 mg total) by mouth daily. Take with or immediately following a meal. (Patient not taking: Reported on 09/13/2017) 30 tablet 6  . nitroGLYCERIN (NITROSTAT) 0.4 MG SL tablet Place 1 tablet (0.4 mg total) under the tongue every 5 (five) minutes x 3 doses as needed for chest pain. (Patient not taking: Reported on 09/13/2017) 25 tablet 3  . predniSONE (STERAPRED UNI-PAK 21 TAB) 10 MG (21) TBPK tablet Start 60 mg once daily, taper 10 mg daily until done (Patient not taking: Reported on 09/13/2017) 21 tablet 0   No current facility-administered medications on file prior to visit.    There are no Patient Instructions on file for this visit. No follow-ups on file.  Illyria Sobocinski A Anmol Fleck, PA-C

## 2017-09-21 ENCOUNTER — Ambulatory Visit
Admission: RE | Admit: 2017-09-21 | Discharge: 2017-09-21 | Disposition: A | Discharge status: Home / Self Care | Payer: PPO | Source: Ambulatory Visit | Attending: Vascular Surgery | Admitting: Vascular Surgery

## 2017-09-21 DIAGNOSIS — I70213 Atherosclerosis of native arteries of extremities with intermittent claudication, bilateral legs: Secondary | ICD-10-CM | POA: Diagnosis not present

## 2017-09-21 DIAGNOSIS — I70219 Atherosclerosis of native arteries of extremities with intermittent claudication, unspecified extremity: Secondary | ICD-10-CM

## 2017-09-22 ENCOUNTER — Ambulatory Visit (INDEPENDENT_AMBULATORY_CARE_PROVIDER_SITE_OTHER): Payer: PPO | Admitting: Vascular Surgery

## 2017-09-22 ENCOUNTER — Encounter (INDEPENDENT_AMBULATORY_CARE_PROVIDER_SITE_OTHER): Payer: Self-pay | Admitting: Vascular Surgery

## 2017-09-22 VITALS — BP 157/85 | HR 94 | Resp 15 | Ht 69.0 in | Wt 145.0 lb

## 2017-09-22 DIAGNOSIS — I70219 Atherosclerosis of native arteries of extremities with intermittent claudication, unspecified extremity: Secondary | ICD-10-CM | POA: Diagnosis not present

## 2017-09-22 DIAGNOSIS — E785 Hyperlipidemia, unspecified: Secondary | ICD-10-CM | POA: Diagnosis not present

## 2017-09-22 DIAGNOSIS — I10 Essential (primary) hypertension: Secondary | ICD-10-CM

## 2017-09-22 NOTE — Progress Notes (Signed)
Subjective:    Patient ID: [name], male    DOB: [birth year], 77 y.o.   MRN: [id number] Chief Complaint  Patient presents with  . Carotid    Ultrasound results   Patient presents to review vascular studies.  Patient was originally seen on September 13, 2017 for evaluation of bilateral lower extremity claudication.  The patient underwent an ABI performed at [facility] radiology department was found to have a right ABI of 0.64 and a left ABI of 0.75.  Right lower extremity was notable for monophasic arterial waveforms at the ankle.  Left lower extremity was notable for abnormal monophasic arterial waveforms at the ankle.  Patient was noted to have moderate bilateral peripheral artery disease.  The patient notes that his symptoms are stable.  Denies any rest pain or ulceration to the bilateral lower extremity.  Patient denies any fever, nausea vomiting.  Review of Systems  Constitutional: Negative.   HENT: Negative.   Eyes: Negative.   Respiratory: Negative.   Cardiovascular:       PAD Claudication  Gastrointestinal: Negative.   Endocrine: Negative.   Genitourinary: Negative.   Musculoskeletal: Negative.   Skin: Negative.   Allergic/Immunologic: Negative.   Neurological: Negative.   Hematological: Negative.   Psychiatric/Behavioral: Negative.       Objective:   Physical Exam  Constitutional: He is oriented to person, place, and time. He appears well-developed and well-nourished. No distress.  HENT:  Head: Normocephalic and atraumatic.  Right Ear: External ear normal.  Left Ear: External ear normal.  Eyes: Pupils are equal, round, and reactive to light. Conjunctivae and EOM are normal.  Neck: Normal range of motion.  Cardiovascular: Normal rate, regular rhythm, normal heart sounds and intact distal pulses.  Pulses:      Radial pulses are 2+ on the right side, and 2+ on the left side.  Hard to palpate pedal pulses bilaterally  Pulmonary/Chest:  Effort normal and breath sounds normal.  Musculoskeletal: Normal range of motion. He exhibits no edema.  Neurological: He is alert and oriented to person, place, and time.  Skin: Skin is warm and dry. He is not diaphoretic.  Psychiatric: He has a normal mood and affect. His behavior is normal. Judgment and thought content normal.  Vitals reviewed.  BP (!) 157/85 (BP Location: Right Arm, Patient Position: Sitting)   Pulse 94   Resp 15   Ht 5\' 9"  (1.753 m)   Wt 145 lb (65.8 kg)   BMI 21.41 kg/m   Past Medical History:  Diagnosis Date  . Chest pain   . COPD (chronic obstructive pulmonary disease) ([place])   . Depression   . Diabetes ([place])   . Dysphagia   . Essential tremor   . Heart disease   . Hyperlipidemia   . Hypertension   . Seizure disorder ([place])   . Tardive dyskinesia   . Vitamin D deficiency    Social History   Socioeconomic History  . Marital status: Married    Spouse name: Not on file  . Number of children: Not on file  . Years of education: Not on file  . Highest education level: Not on file  Occupational History  . Not on file  Social Needs  . Financial resource strain: Not on file  . Food insecurity:    Worry: Not on file    Inability: Not on file  . Transportation needs:    Medical: Not on file    Non-medical: Not on file  Tobacco Use  . Smoking status: Former Smoker    Last attempt to quit: 09/04/2004    Years since quitting: 13.0  . Smokeless tobacco: Never Used  Substance and Sexual Activity  . Alcohol use: No    Alcohol/week: 0.0 oz  . Drug use: No  . Sexual activity: Not on file  Lifestyle  . Physical activity:    Days per week: Not on file    Minutes per session: Not on file  . Stress: Not on file  Relationships  . Social connections:    Talks on phone: Not on file    Gets together: Not on file    Attends religious service: Not on file    Active member of club or organization: Not on file    Attends meetings of clubs or organizations:  Not on file    Relationship status: Not on file  . Intimate partner violence:    Fear of current or ex partner: Not on file    Emotionally abused: Not on file    Physically abused: Not on file    Forced sexual activity: Not on file  Other Topics Concern  . Not on file  Social History Narrative   Married.   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   Enjoys spending time with family.    Past Surgical History:  Procedure Laterality Date  . CARDIAC CATHETERIZATION N/A 01/19/2016   Procedure: Left Heart Cath and Coronary Angiography;  Surgeon: Jolaine Artist, MD;  Location: [facility];  Service: Cardiovascular;  Laterality: N/A;  . CARDIAC CATHETERIZATION  01/19/2016   Procedure: Coronary Stent Intervention;  Surgeon: Jettie Booze, MD;  Location: [facility];  Service: Cardiovascular;;  . INGUINAL HERNIA REPAIR Left as child   Family History  Problem Relation Age of Onset  . Lung cancer Father   . Emphysema Father   . Depression Mother   . CAD Brother   . Heart attack Brother        MI in his 20's   Allergies  Allergen Reactions  . Levaquin [Levofloxacin In D5w] Nausea And Vomiting  . Penicillins Hives    Childhood allergy. Has patient had a PCN reaction causing immediate rash, facial/tongue/throat swelling, SOB or lightheadedness with hypotension: Unknown Has patient had a PCN reaction causing severe rash involving mucus membranes or skin necrosis: Unknown Has patient had a PCN reaction that required hospitalization: Unknown Has patient had a PCN reaction occurring within the last 10 years: No If all of the above answers are "NO", then may proceed with Cephalosporin use.       Assessment & Plan:  Patient presents to review vascular studies.  Patient was originally seen on September 13, 2017 for evaluation of bilateral lower extremity claudication.  The patient underwent an ABI performed at [facility] radiology department was found to  have a right ABI of 0.64 and a left ABI of 0.75.  Right lower extremity was notable for monophasic arterial waveforms at the ankle.  Left lower extremity was notable for abnormal monophasic arterial waveforms at the ankle.  Patient was noted to have moderate bilateral peripheral artery disease.  The patient notes that his symptoms are stable.  Denies any rest pain or ulceration to the bilateral lower extremity.  Patient denies any fever, nausea vomiting.  1. Atherosclerosis of lower extremity with claudication ([place]) - Stable Patient presents originally with intermittent lower extremity claudication-like symptoms. ABI was notable for moderate peripheral artery disease  bilaterally with monophasic waveforms at the ankles. We discussed moving forward with a right lower extremity angiogram followed by a left lower extremity angiogram and attempt to assess the patient's anatomy and degree of contributing peripheral artery disease and if appropriate at the time an attempt to revascularize the leg can be made. At this point, the patient states that his symptoms are not lifestyle limiting to move forward with an angiogram he would like to follow-up with a surveillance ABI in 6 months If the patient's symptoms worsen over the next 6 months he is to call the office and at that point we can discuss moving forward with an angiogram I did discuss what an angiogram is in detail with the patient today. I have discussed with the patient at length the risk factors for and pathogenesis of atherosclerotic disease and encouraged a healthy diet, regular exercise regimen and blood pressure / glucose control.  The patient was encouraged to call the office in the interim if he experiences any claudication like symptoms, rest pain or ulcers to his feet / toes.  - VAS [place] ABI WITH/WO TBI; Future  2. Hyperlipidemia, unspecified hyperlipidemia type - Stable Encouraged good control as its slows the progression of atherosclerotic  disease  3. Essential hypertension - Stable Encouraged good control as its slows the progression of atherosclerotic disease  Current Outpatient Medications on File Prior to Visit  Medication Sig Dispense Refill  . aspirin EC 81 MG EC tablet Take 1 tablet (81 mg total) by mouth daily. 30 tablet 0  . carbidopa-levodopa (SINEMET IR) 25-100 MG tablet Take 1.5 tablets by mouth 3 (three) times daily. (Patient taking differently: Take by mouth 3 (three) times daily. ) 150 tablet 2  . losartan (COZAAR) 25 MG tablet Take 1 tablet (25 mg total) by mouth daily. 30 tablet 8  . meclizine (ANTIVERT) 25 MG tablet Take 25 mg by mouth as needed for nausea.    . metoprolol succinate (TOPROL-XL) 50 MG 24 hr tablet Take 1 tablet (50 mg total) by mouth daily. Take with or immediately following a meal. (Patient not taking: Reported on 09/13/2017) 30 tablet 6  . nitroGLYCERIN (NITROSTAT) 0.4 MG SL tablet Place 1 tablet (0.4 mg total) under the tongue every 5 (five) minutes x 3 doses as needed for chest pain. (Patient not taking: Reported on 09/13/2017) 25 tablet 3  . predniSONE (STERAPRED UNI-PAK 21 TAB) 10 MG (21) TBPK tablet Start 60 mg once daily, taper 10 mg daily until done (Patient not taking: Reported on 09/13/2017) 21 tablet 0  . traMADol (ULTRAM) 50 MG tablet Take by mouth.     No current facility-administered medications on file prior to visit.    There are no Patient Instructions on file for this visit. No follow-ups on file.  Sequoya Hogsett A Amedio Bowlby, PA-C

## 2017-10-27 ENCOUNTER — Other Ambulatory Visit: Payer: Self-pay | Admitting: Cardiology

## 2017-10-27 NOTE — Telephone Encounter (Signed)
Rx sent to pharmacy   

## 2017-11-01 DIAGNOSIS — R251 Tremor, unspecified: Secondary | ICD-10-CM | POA: Diagnosis not present

## 2017-11-01 DIAGNOSIS — R262 Difficulty in walking, not elsewhere classified: Secondary | ICD-10-CM | POA: Diagnosis not present

## 2017-11-02 ENCOUNTER — Other Ambulatory Visit: Payer: Self-pay | Admitting: Acute Care

## 2017-11-02 DIAGNOSIS — R251 Tremor, unspecified: Secondary | ICD-10-CM

## 2017-11-12 ENCOUNTER — Other Ambulatory Visit: Payer: Self-pay

## 2017-11-12 ENCOUNTER — Emergency Department
Admission: EM | Admit: 2017-11-12 | Discharge: 2017-11-12 | Disposition: A | Discharge status: Home / Self Care | Payer: PPO | Attending: Emergency Medicine | Admitting: Emergency Medicine

## 2017-11-12 ENCOUNTER — Ambulatory Visit
Admission: RE | Admit: 2017-11-12 | Discharge: 2017-11-12 | Disposition: A | Discharge status: Home / Self Care | Payer: PPO | Source: Ambulatory Visit | Attending: Acute Care | Admitting: Acute Care

## 2017-11-12 DIAGNOSIS — R55 Syncope and collapse: Secondary | ICD-10-CM | POA: Diagnosis not present

## 2017-11-12 DIAGNOSIS — I252 Old myocardial infarction: Secondary | ICD-10-CM | POA: Insufficient documentation

## 2017-11-12 DIAGNOSIS — Z79899 Other long term (current) drug therapy: Secondary | ICD-10-CM | POA: Diagnosis not present

## 2017-11-12 DIAGNOSIS — R413 Other amnesia: Secondary | ICD-10-CM | POA: Diagnosis not present

## 2017-11-12 DIAGNOSIS — J449 Chronic obstructive pulmonary disease, unspecified: Secondary | ICD-10-CM | POA: Diagnosis not present

## 2017-11-12 DIAGNOSIS — Z87891 Personal history of nicotine dependence: Secondary | ICD-10-CM | POA: Diagnosis not present

## 2017-11-12 DIAGNOSIS — Z7982 Long term (current) use of aspirin: Secondary | ICD-10-CM | POA: Insufficient documentation

## 2017-11-12 DIAGNOSIS — I10 Essential (primary) hypertension: Secondary | ICD-10-CM | POA: Insufficient documentation

## 2017-11-12 DIAGNOSIS — E119 Type 2 diabetes mellitus without complications: Secondary | ICD-10-CM | POA: Insufficient documentation

## 2017-11-12 DIAGNOSIS — R251 Tremor, unspecified: Secondary | ICD-10-CM

## 2017-11-12 DIAGNOSIS — I6782 Cerebral ischemia: Secondary | ICD-10-CM | POA: Insufficient documentation

## 2017-11-12 LAB — URINALYSIS, COMPLETE (UACMP) WITH MICROSCOPIC
BACTERIA UA: NONE SEEN
BILIRUBIN URINE: NEGATIVE
GLUCOSE, UA: NEGATIVE mg/dL
Ketones, ur: NEGATIVE mg/dL
LEUKOCYTES UA: NEGATIVE
NITRITE: NEGATIVE
PROTEIN: NEGATIVE mg/dL
Specific Gravity, Urine: 1.018 (ref 1.005–1.030)
pH: 5 (ref 5.0–8.0)

## 2017-11-12 LAB — CBC
HEMATOCRIT: 47.7 % (ref 40.0–52.0)
HEMOGLOBIN: 16.4 g/dL (ref 13.0–18.0)
MCH: 28.8 pg (ref 26.0–34.0)
MCHC: 34.3 g/dL (ref 32.0–36.0)
MCV: 84.1 fL (ref 80.0–100.0)
Platelets: 229 10*3/uL (ref 150–440)
RBC: 5.68 MIL/uL (ref 4.40–5.90)
RDW: 13.5 % (ref 11.5–14.5)
WBC: 8.7 10*3/uL (ref 3.8–10.6)

## 2017-11-12 LAB — BASIC METABOLIC PANEL
ANION GAP: 5 (ref 5–15)
BUN: 11 mg/dL (ref 8–23)
CO2: 30 mmol/L (ref 22–32)
Calcium: 9.1 mg/dL (ref 8.9–10.3)
Chloride: 106 mmol/L (ref 98–111)
Creatinine, Ser: 0.8 mg/dL (ref 0.61–1.24)
Glucose, Bld: 109 mg/dL — ABNORMAL HIGH (ref 70–99)
POTASSIUM: 3.9 mmol/L (ref 3.5–5.1)
SODIUM: 141 mmol/L (ref 135–145)

## 2017-11-12 LAB — TROPONIN I: Troponin I: <0.03 ng/mL (ref <0.03)

## 2017-11-12 MED ORDER — SODIUM CHLORIDE 0.9 % IV BOLUS
1000.0000 mL | Freq: Once | INTRAVENOUS | Status: AC
  Administered 2017-11-12: 1000 mL via INTRAVENOUS

## 2017-11-12 NOTE — Discharge Instructions (Signed)
Please seek medical attention for any high fevers, chest pain, shortness of breath, change in behavior, persistent vomiting, bloody stool or any other new or concerning symptoms.  

## 2017-11-12 NOTE — ED Triage Notes (Addendum)
Pt was brought over from outpatient MRI with c/o near syncope after having a noncontrast MRI. States he has been having these episodes for 2-3 months and that is why he was having the scan today. States he has periods of confusion and not sure where he is. Pt is oriented to place and self on arrival. Disoriented to day, year, month

## 2017-11-12 NOTE — ED Provider Notes (Signed)
[facility] Emergency Department Provider Note   ____________________________________________   I have reviewed the triage vital signs and the nursing notes.   HISTORY  Chief Complaint Near Syncope   History limited by: Not Limited   HPI [name] is a 77 y.o. male who presents to the emergency department today MRI because of a near syncopal episode.  Patient was in the MRI and after they finished examined when they set him up he felt like he was going to pass out.  Felt like this for about 10 minutes.  He stated that he was aware of things are going on but was having a hard time saying words.  Time my exam he is feeling better.  He denied any chest pain or shortness of breath.  Denies any recent illness.  Was getting the MRI for tremor.    Per medical record review patient has a history of chest pain, COPD.  Past Medical History:  Diagnosis Date  . Chest pain   . COPD (chronic obstructive pulmonary disease) ([place])   . Depression   . Diabetes ([place])   . Dysphagia   . Essential tremor   . Heart disease   . Hyperlipidemia   . Hypertension   . Seizure disorder ([place])   . Tardive dyskinesia   . Vitamin D deficiency     Patient Active Problem List   Diagnosis Date Noted  . Atherosclerosis of lower extremity with claudication ([place]) 09/13/2017  . COPD exacerbation ([place]) 08/09/2017  . Hypokalemia 06/20/2016  . Syncope   . CAD (coronary artery disease) 06/19/2016  . Status post coronary artery stent placement   . SVT (supraventricular tachycardia) ([place])   . NSVT (nonsustained ventricular tachycardia) ([place])   . NSTEMI (non-ST elevated myocardial infarction) ([place])   . Parkinson's variant of multiple system atrophy ([place]) 12/19/2015  . Encephalopathy chronic 12/14/2015  . Chest pain   . COPD (chronic obstructive pulmonary disease) ([place])   . Heart disease   . Seizure disorder ([place])   . Depression   . Tardive dyskinesia   . Dysphagia   .  Skin lesion 08/14/2015  . Shortness of breath 08/14/2015  . Essential hypertension 08/14/2015  . Hyperlipidemia 08/14/2015  . Essential tremor 08/14/2015    Past Surgical History:  Procedure Laterality Date  . CARDIAC CATHETERIZATION N/A 01/19/2016   Procedure: Left Heart Cath and Coronary Angiography;  Surgeon: Jolaine Artist, MD;  Location: [facility];  Service: Cardiovascular;  Laterality: N/A;  . CARDIAC CATHETERIZATION  01/19/2016   Procedure: Coronary Stent Intervention;  Surgeon: Jettie Booze, MD;  Location: [facility];  Service: Cardiovascular;;  . INGUINAL HERNIA REPAIR Left as child    Prior to Admission medications   Medication Sig Start Date End Date Taking? Authorizing Provider  aspirin EC 81 MG EC tablet Take 1 tablet (81 mg total) by mouth daily. 12/16/15   Regalado, Belkys A, MD  carbidopa-levodopa (SINEMET IR) 25-100 MG tablet Take 1.5 tablets by mouth 3 (three) times daily. Patient taking differently: Take by mouth 3 (three) times daily.  05/21/16   Pieter Partridge, DO  losartan (COZAAR) 25 MG tablet Take 1 tablet (25 mg total) by mouth daily. 10/27/17   Dorothy Spark, MD  meclizine (ANTIVERT) 25 MG tablet Take 25 mg by mouth as needed for nausea.    [provider]  metoprolol succinate (TOPROL-XL) 50 MG 24 hr tablet Take 1 tablet (50 mg total) by mouth daily.  Take with or immediately following a meal. Please make appointment for further refills. 10/27/17   Wellington Hampshire, MD  nitroGLYCERIN (NITROSTAT) 0.4 MG SL tablet Place 1 tablet (0.4 mg total) under the tongue every 5 (five) minutes x 3 doses as needed for chest pain. Patient not taking: Reported on 09/13/2017 01/21/16   Cheryln Manly, NP  predniSONE (STERAPRED UNI-PAK 21 TAB) 10 MG (21) TBPK tablet Start 60 mg once daily, taper 10 mg daily until done Patient not taking: Reported on 09/13/2017 08/10/17   Max Sane, MD  traMADol Veatrice Bourbon) 50 MG tablet Take by mouth. 08/29/17    [provider]    Allergies Levaquin [levofloxacin in d5w] and Penicillins  Family History  Problem Relation Age of Onset  . Lung cancer Father   . Emphysema Father   . Depression Mother   . CAD Brother   . Heart attack Brother        MI in his 29's    Social History Social History   Tobacco Use  . Smoking status: Former Smoker    Last attempt to quit: 09/04/2004    Years since quitting: 13.1  . Smokeless tobacco: Never Used  Substance Use Topics  . Alcohol use: No    Alcohol/week: 0.0 oz  . Drug use: No    Review of Systems Constitutional: No fever/chills Eyes: No visual changes. ENT: No sore throat. Cardiovascular: Denies chest pain. Respiratory: Denies shortness of breath. Gastrointestinal: No abdominal pain.  No nausea, no vomiting.  No diarrhea.   Genitourinary: Negative for dysuria. Musculoskeletal: Negative for back pain. Skin: Negative for rash. Neurological: Positive for tremors.   ____________________________________________   PHYSICAL EXAM:  VITAL SIGNS: ED Triage Vitals  Enc Vitals Group     BP 11/12/17 1411 (!) 178/84     Pulse Rate 11/12/17 1411 (!) 104     Resp 11/12/17 1411 18     Temp 11/12/17 1411 98 F (36.7 C)     Temp Source 11/12/17 1411 Oral     SpO2 11/12/17 1411 97 %     Weight 11/12/17 1413 145 lb (65.8 kg)     Height 11/12/17 1413 5\' 9"  (1.753 m)     Head Circumference --      Peak Flow --      Pain Score 11/12/17 1413 0   Constitutional: Alert and oriented.  Eyes: Conjunctivae are normal.  ENT      Head: Normocephalic and atraumatic.      Nose: No congestion/rhinnorhea.      Mouth/Throat: Mucous membranes are moist.      Neck: No stridor. Hematological/Lymphatic/Immunilogical: No cervical lymphadenopathy. Cardiovascular: Normal rate, regular rhythm.  No murmurs, rubs, or gallops.  Respiratory: Normal respiratory effort without tachypnea nor retractions. Breath sounds are clear and equal bilaterally. No  wheezes/rales/rhonchi. Gastrointestinal: Soft and non tender. No rebound. No guarding.  Genitourinary: Deferred Musculoskeletal: Normal range of motion in all extremities. No lower extremity edema. Neurologic:  Normal speech and language. No gross focal neurologic deficits are appreciated.  Skin:  Skin is warm, dry and intact. No rash noted. Psychiatric: Mood and affect are normal. Speech and behavior are normal. Patient exhibits appropriate insight and judgment.  ____________________________________________    LABS (pertinent positives/negatives)  Trop <0.03 BMP wnl except glu 109 CBC wnl UA clear, nitrite and leukocytes negative, rbc and wbc 0-5 ____________________________________________   EKG  I, Nance Pear, attending physician, personally viewed and interpreted this EKG  EKG Time: 1416 Rate: 102  Rhythm: sinus tachycardia with pvc Axis: normal Intervals: qtc 466 QRS: narrow ST changes: no st elevation Impression: abnormal ekg  ____________________________________________    RADIOLOGY  None  ____________________________________________   PROCEDURES  Procedures  ____________________________________________   INITIAL IMPRESSION / ASSESSMENT AND PLAN / ED COURSE  Pertinent labs & imaging results that were available during my care of the patient were reviewed by me and considered in my medical decision making (see chart for details).   Patient presented to the emergency department today because of concerns for near syncopal episode after MRI exam.  Blood work and urine without any concerning findings.  Glucose very minimally elevated.  Patient was given IV fluids.  Not have any further recurrence of symptoms.  Will plan on discharge follow-up with primary care physician.   ____________________________________________   FINAL CLINICAL IMPRESSION(S) / ED DIAGNOSES  Final diagnoses:  Near syncope     Note: This dictation was prepared with Dragon  dictation. Any transcriptional errors that result from this process are unintentional     Nance Pear, MD 11/12/17 [phone]

## 2017-11-12 NOTE — ED Notes (Signed)
Pt reports that after his MRI, when he sat up, everything went black. Pt denies changing positions too fast. Pt states that since episode of near syncope he has felt confused. Pt is A & O at this time.

## 2017-12-06 ENCOUNTER — Ambulatory Visit: Payer: PPO | Attending: Neurology | Admitting: Physical Therapy

## 2017-12-08 ENCOUNTER — Ambulatory Visit: Payer: PPO | Admitting: Physical Therapy

## 2017-12-13 ENCOUNTER — Ambulatory Visit: Payer: PPO | Admitting: Physical Therapy

## 2017-12-15 ENCOUNTER — Ambulatory Visit: Payer: PPO | Admitting: Physical Therapy

## 2017-12-19 ENCOUNTER — Ambulatory Visit: Payer: PPO | Admitting: Physical Therapy

## 2017-12-21 ENCOUNTER — Ambulatory Visit: Payer: PPO | Admitting: Physical Therapy

## 2017-12-26 ENCOUNTER — Ambulatory Visit: Payer: PPO | Admitting: Physical Therapy

## 2017-12-28 ENCOUNTER — Ambulatory Visit: Payer: PPO | Admitting: Physical Therapy

## 2018-01-02 ENCOUNTER — Ambulatory Visit: Payer: PPO | Admitting: Physical Therapy

## 2018-01-04 ENCOUNTER — Ambulatory Visit: Payer: PPO | Admitting: Physical Therapy

## 2018-01-09 ENCOUNTER — Ambulatory Visit: Payer: PPO | Admitting: Physical Therapy

## 2018-01-11 ENCOUNTER — Ambulatory Visit: Payer: PPO | Admitting: Physical Therapy

## 2018-01-16 ENCOUNTER — Ambulatory Visit: Payer: PPO | Admitting: Physical Therapy

## 2018-01-18 ENCOUNTER — Ambulatory Visit: Payer: PPO | Admitting: Physical Therapy

## 2018-01-25 ENCOUNTER — Ambulatory Visit: Payer: PPO | Admitting: Physical Therapy

## 2018-01-30 ENCOUNTER — Ambulatory Visit: Payer: PPO | Admitting: Physical Therapy

## 2018-02-01 ENCOUNTER — Ambulatory Visit: Payer: PPO | Admitting: Physical Therapy

## 2018-02-08 ENCOUNTER — Ambulatory Visit: Payer: PPO | Admitting: Physical Therapy

## 2018-03-01 DIAGNOSIS — E1142 Type 2 diabetes mellitus with diabetic polyneuropathy: Secondary | ICD-10-CM | POA: Diagnosis not present

## 2018-03-01 DIAGNOSIS — E782 Mixed hyperlipidemia: Secondary | ICD-10-CM | POA: Diagnosis not present

## 2018-03-01 DIAGNOSIS — R569 Unspecified convulsions: Secondary | ICD-10-CM | POA: Diagnosis not present

## 2018-03-01 DIAGNOSIS — I10 Essential (primary) hypertension: Secondary | ICD-10-CM | POA: Diagnosis not present

## 2018-03-01 DIAGNOSIS — R0602 Shortness of breath: Secondary | ICD-10-CM | POA: Diagnosis not present

## 2018-03-01 DIAGNOSIS — R0609 Other forms of dyspnea: Secondary | ICD-10-CM | POA: Diagnosis not present

## 2018-03-01 DIAGNOSIS — Z125 Encounter for screening for malignant neoplasm of prostate: Secondary | ICD-10-CM | POA: Diagnosis not present

## 2018-03-01 DIAGNOSIS — Z0000 Encounter for general adult medical examination without abnormal findings: Secondary | ICD-10-CM | POA: Diagnosis not present

## 2018-03-01 DIAGNOSIS — R55 Syncope and collapse: Secondary | ICD-10-CM | POA: Diagnosis not present

## 2018-03-01 DIAGNOSIS — Z79899 Other long term (current) drug therapy: Secondary | ICD-10-CM | POA: Diagnosis not present

## 2018-03-02 DIAGNOSIS — R0602 Shortness of breath: Secondary | ICD-10-CM | POA: Diagnosis not present

## 2018-03-02 DIAGNOSIS — E782 Mixed hyperlipidemia: Secondary | ICD-10-CM | POA: Diagnosis not present

## 2018-03-02 DIAGNOSIS — I10 Essential (primary) hypertension: Secondary | ICD-10-CM | POA: Diagnosis not present

## 2018-03-21 DIAGNOSIS — R0602 Shortness of breath: Secondary | ICD-10-CM | POA: Diagnosis not present

## 2018-03-27 ENCOUNTER — Encounter: Payer: Self-pay | Admitting: Cardiology

## 2018-03-27 ENCOUNTER — Encounter: Payer: Self-pay | Admitting: *Deleted

## 2018-03-27 ENCOUNTER — Encounter (INDEPENDENT_AMBULATORY_CARE_PROVIDER_SITE_OTHER): Payer: Self-pay

## 2018-03-27 ENCOUNTER — Ambulatory Visit: Payer: PPO | Admitting: Cardiology

## 2018-03-27 VITALS — BP 140/86 | HR 93 | Ht 69.0 in | Wt 150.0 lb

## 2018-03-27 DIAGNOSIS — R072 Precordial pain: Secondary | ICD-10-CM | POA: Diagnosis not present

## 2018-03-27 DIAGNOSIS — I25118 Atherosclerotic heart disease of native coronary artery with other forms of angina pectoris: Secondary | ICD-10-CM | POA: Diagnosis not present

## 2018-03-27 DIAGNOSIS — E785 Hyperlipidemia, unspecified: Secondary | ICD-10-CM | POA: Diagnosis not present

## 2018-03-27 MED ORDER — LOSARTAN POTASSIUM 100 MG PO TABS: 100.0000 mg | ORAL_TABLET | Freq: Every day | ORAL | 2 refills | Status: AC

## 2018-03-27 MED ORDER — ROSUVASTATIN CALCIUM 5 MG PO TABS: 5.0000 mg | ORAL_TABLET | Freq: Every day | ORAL | 2 refills | Status: AC

## 2018-03-27 NOTE — Patient Instructions (Addendum)
Medication Instructions:   INCREASE YOUR LOSARTAN TO 100 MG ONCE DAILY  START TAKING ROSUVASTATIN 5 MG ONCE DAILY  If you need a refill on your cardiac medications before your next appointment, please call your pharmacy.      Testing/Procedures:  Your physician has requested that you have a lexiscan myoview. For further information please visit [email or web address]. Please follow instruction sheet, as given.  PLEASE DO THIS ON D-SPECT     Follow-Up:  3 MONTHS WITH DR Meda Coffee

## 2018-03-27 NOTE — Progress Notes (Signed)
03/27/2018 [name]   January 29, 1941  [id number]  Primary Physician Patient, No Pcp Per Primary Cardiologist: Dr. Meda Coffee   Reason for Visit/CC: 6 months follow up  HPI:  77 y/o male with h/o HTN, HLD , COPD and CAD admitted to [facility] in 12/2015 with chest pain.  Troponin was elevated to 0.27.  He underwent stress testing that showed no ischemia but prior infarct. He was discharged home with no follow-up. His echocardiogram showed normal LVEF grade 1 diastolic dysfunction. No significant valvular abnormality.  He presented back to the hospital on January 15, 2016 with NSTEMI, LHC with Dr. Haroldine Laws showing 99% stenosed OM-1 treated with DES x1 by Dr. Irish Lack. EF was noted at 50%-55%. He was continued on ASA and Brilinta was added post cath. He was also noted to have runs of SVT/NSVT on telemetry and his metoprolol was increased. Per Discharge summary, it was recommended by Dr. Debara Pickett and Dr. Curt Bears that he be further evaluated with an outpatient event monitor.  06/17/2016 is coming after 3 months, he states that he has been doing well from cardiac standpoint and denies any shortness of breath or chest pain, but he is complaining or claudications are after minimal walking distances. His significantly affects his quality of life. He has been compliant with these medications and denies any side effects, he has no bleeding no muscle cramping. He also denies palpitations or syncope and denies lower extremity edema or orthopnea.  03/27/2018 -this is 6 months follow-up, the patient states that he recently went to his primary care physician, Dr. Doy Hutching in [place] who checked his echocardiogram that showed decreased LVEF, per patient approximately 30%, will obtain report.  He states that he feels progressively more short of breath walking around walking to his mailbox.  He describes very atypical chest pain in the left side that is not exacerbated by exertion, not related to food intake, and sharp  lasting about 5 seconds or more per day plan a day.  Is not associated with shortness of breath.  He has no lower extremity edema.  Denies palpitations or syncope.  His Parkinson medications have been discontinued as he did not see any benefit from them.   Current Meds  Medication Sig  . aspirin EC 81 MG EC tablet Take 1 tablet (81 mg total) by mouth daily.  Marland Kitchen losartan (COZAAR) 50 MG tablet Take 50 mg by mouth daily.  . metoprolol succinate (TOPROL-XL) 100 MG 24 hr tablet Take 100 mg by mouth daily. Take with or immediately following a meal.  . nitroGLYCERIN (NITROSTAT) 0.4 MG SL tablet Place 1 tablet (0.4 mg total) under the tongue every 5 (five) minutes x 3 doses as needed for chest pain.   Allergies  Allergen Reactions  . Levaquin [Levofloxacin In D5w] Nausea And Vomiting  . Penicillins Hives    Childhood allergy. Has patient had a PCN reaction causing immediate rash, facial/tongue/throat swelling, SOB or lightheadedness with hypotension: Unknown Has patient had a PCN reaction causing severe rash involving mucus membranes or skin necrosis: Unknown Has patient had a PCN reaction that required hospitalization: Unknown Has patient had a PCN reaction occurring within the last 10 years: No If all of the above answers are "NO", then may proceed with Cephalosporin use.    Past Medical History:  Diagnosis Date  . Chest pain   . COPD (chronic obstructive pulmonary disease) ([place])   . Depression   . Diabetes ([place])   . Dysphagia   . Essential tremor   .  Heart disease   . Hyperlipidemia   . Hypertension   . Seizure disorder ([place])   . Tardive dyskinesia   . Vitamin D deficiency    Family History  Problem Relation Age of Onset  . Lung cancer Father   . Emphysema Father   . Depression Mother   . CAD Brother   . Heart attack Brother        MI in his 20's   Past Surgical History:  Procedure Laterality Date  . CARDIAC CATHETERIZATION N/A 01/19/2016   Procedure: Left Heart Cath and  Coronary Angiography;  Surgeon: Jolaine Artist, MD;  Location: [place] [facility];  Service: Cardiovascular;  Laterality: N/A;  . CARDIAC CATHETERIZATION  01/19/2016   Procedure: Coronary Stent Intervention;  Surgeon: Jettie Booze, MD;  Location: [place] [facility];  Service: Cardiovascular;;  . INGUINAL HERNIA REPAIR Left as child   Social History   Socioeconomic History  . Marital status: Married    Spouse name: Not on file  . Number of children: Not on file  . Years of education: Not on file  . Highest education level: Not on file  Occupational History  . Not on file  Social Needs  . Financial resource strain: Not on file  . Food insecurity:    Worry: Not on file    Inability: Not on file  . Transportation needs:    Medical: Not on file    Non-medical: Not on file  Tobacco Use  . Smoking status: Former Smoker    Last attempt to quit: 09/04/2004    Years since quitting: 13.5  . Smokeless tobacco: Never Used  Substance and Sexual Activity  . Alcohol use: No    Alcohol/week: 0.0 standard drinks  . Drug use: No  . Sexual activity: Not on file  Lifestyle  . Physical activity:    Days per week: Not on file    Minutes per session: Not on file  . Stress: Not on file  Relationships  . Social connections:    Talks on phone: Not on file    Gets together: Not on file    Attends religious service: Not on file    Active member of club or organization: Not on file    Attends meetings of clubs or organizations: Not on file    Relationship status: Not on file  . Intimate partner violence:    Fear of current or ex partner: Not on file    Emotionally abused: Not on file    Physically abused: Not on file    Forced sexual activity: Not on file  Other Topics Concern  . Not on file  Social History Narrative   Married.   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   Enjoys spending time with family.      Review of Systems: General: negative for chills, fever, night  sweats or weight changes.  Cardiovascular: negative for chest pain, dyspnea on exertion, edema, orthopnea, palpitations, paroxysmal nocturnal dyspnea or shortness of breath Dermatological: negative for rash Respiratory: negative for cough or wheezing Urologic: negative for hematuria Abdominal: negative for nausea, vomiting, diarrhea, bright red blood per rectum, melena, or hematemesis Neurologic: negative for visual changes, syncope, or dizziness All other systems reviewed and are otherwise negative except as noted above.   Physical Exam:  Blood pressure 140/86, pulse 93, height 5\' 9"  (1.753 m), weight 150 lb (68 kg), SpO2 99 %.  General appearance: alert, cooperative and no distress, thin, resting tremor Neck:  no carotid bruit and no JVD Lungs: Expiratory wheezes bilaterally Heart: regular rate and rhythm, S1, S2 normal, no murmur, click, rub or gallop Extremities: no LEE Pulses: weak in DP and PT B/L Skin: warm and dry Neurologic: Grossly normal  EKG NSR with PVC    ASSESSMENT AND PLAN:   1. CAD: s/p NSTEMI in 01/2016 (99% stenosed OM-1 treated with DES x1 by Dr. Irish Lack). EF was noted at 50%-55%.  We will obtain echocardiogram from Dr. Doy Hutching office.  We will continue aspirin 81 mg daily, continue Toprol-XL 100 mg daily and increase losartan to 100 mg daily as he is hypertensive.  I will restart rosuvastatin 5 mg daily.  We will obtain a Lexiscan nuclear stress test to evaluate for ischemia.   If the patient in fact has decreased LVEF, he should technically be on Entresto, I am concerned about compliance and affordability with this, patient seems to be confused about what medication he is taking and which ones were discontinued and why.  For now I will continue losartan.  2. NSVT: No further palpitations, 30 day event monitor showed only infrequent PVCs.  Continue metoprolol.   3. HTN: Uncontrolled, I will increase losartan to 100 mg daily.  4. HLD: on no statin therapy,  Lipitor with significant muscle pain, he does not remember why was rosuvastatin discontinued, I will restart at 5 mg daily.  We will check LFTs at the next visit.  5. Claudications -he is followed by Dr. Fletcher Anon.  6. Parkinson Disease: Followed by Dr. Carles Collet.    PLAN  F/u with Dr. Meda Coffee in 3 months Ena Dawley , MD 03/27/2018 8:22 AM

## 2018-03-28 ENCOUNTER — Ambulatory Visit (INDEPENDENT_AMBULATORY_CARE_PROVIDER_SITE_OTHER): Payer: PPO | Admitting: Nurse Practitioner

## 2018-03-28 ENCOUNTER — Encounter (INDEPENDENT_AMBULATORY_CARE_PROVIDER_SITE_OTHER): Payer: PPO

## 2018-04-03 ENCOUNTER — Telehealth (HOSPITAL_COMMUNITY): Payer: Self-pay | Admitting: *Deleted

## 2018-04-03 NOTE — Telephone Encounter (Signed)
Left message on voicemail per DPR in reference to upcoming appointment scheduled on 04/10/18 at 0800 with detailed instructions given per Myocardial Perfusion Study Information Sheet for the test. LM to arrive 15 minutes early, and that it is imperative to arrive on time for appointment to keep from having the test rescheduled. If you need to cancel or reschedule your appointment, please call the office within 24 hours of your appointment. Failure to do so may result in a cancellation of your appointment, and a $50 no show fee. Phone number given for call back for any questions. Amillion Scobee, [place]

## 2018-04-10 ENCOUNTER — Encounter (HOSPITAL_COMMUNITY): Payer: PPO

## 2018-04-14 ENCOUNTER — Encounter: Payer: Self-pay | Admitting: Cardiology

## 2018-04-25 DIAGNOSIS — R0602 Shortness of breath: Secondary | ICD-10-CM | POA: Diagnosis not present

## 2018-04-25 DIAGNOSIS — I10 Essential (primary) hypertension: Secondary | ICD-10-CM | POA: Diagnosis not present

## 2018-04-25 DIAGNOSIS — E782 Mixed hyperlipidemia: Secondary | ICD-10-CM | POA: Diagnosis not present

## 2018-04-25 DIAGNOSIS — E1142 Type 2 diabetes mellitus with diabetic polyneuropathy: Secondary | ICD-10-CM | POA: Diagnosis not present

## 2018-04-25 DIAGNOSIS — R0609 Other forms of dyspnea: Secondary | ICD-10-CM | POA: Diagnosis not present

## 2018-04-26 IMAGING — DX DG CHEST 2V
2 series · 2 of 2 positions shown · non-contrast
Comparison: 02/04/2016

CLINICAL DATA: Left-sided chest pain for 2 days

EXAM:
CHEST  2 VIEW

[chest pa]
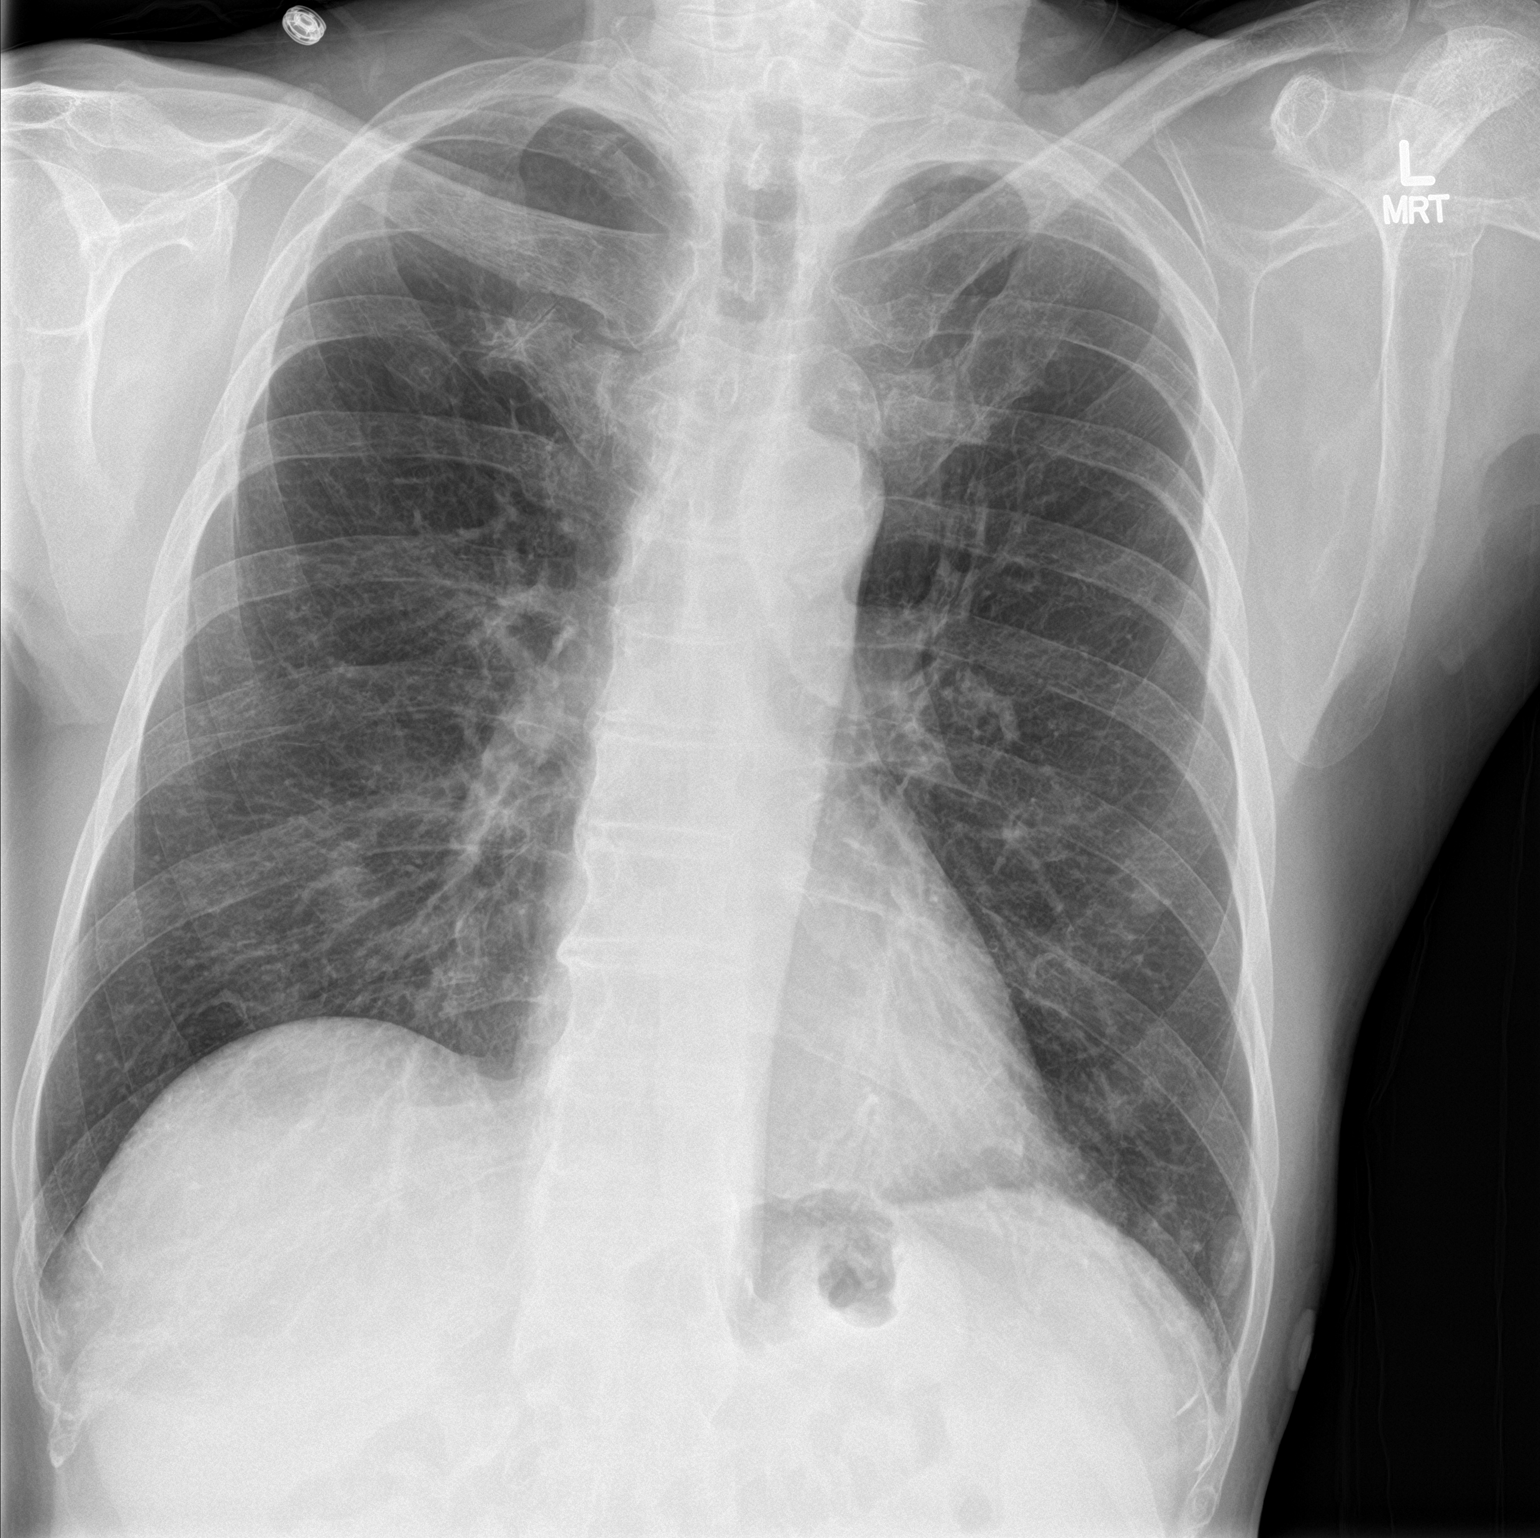

[chest lat]
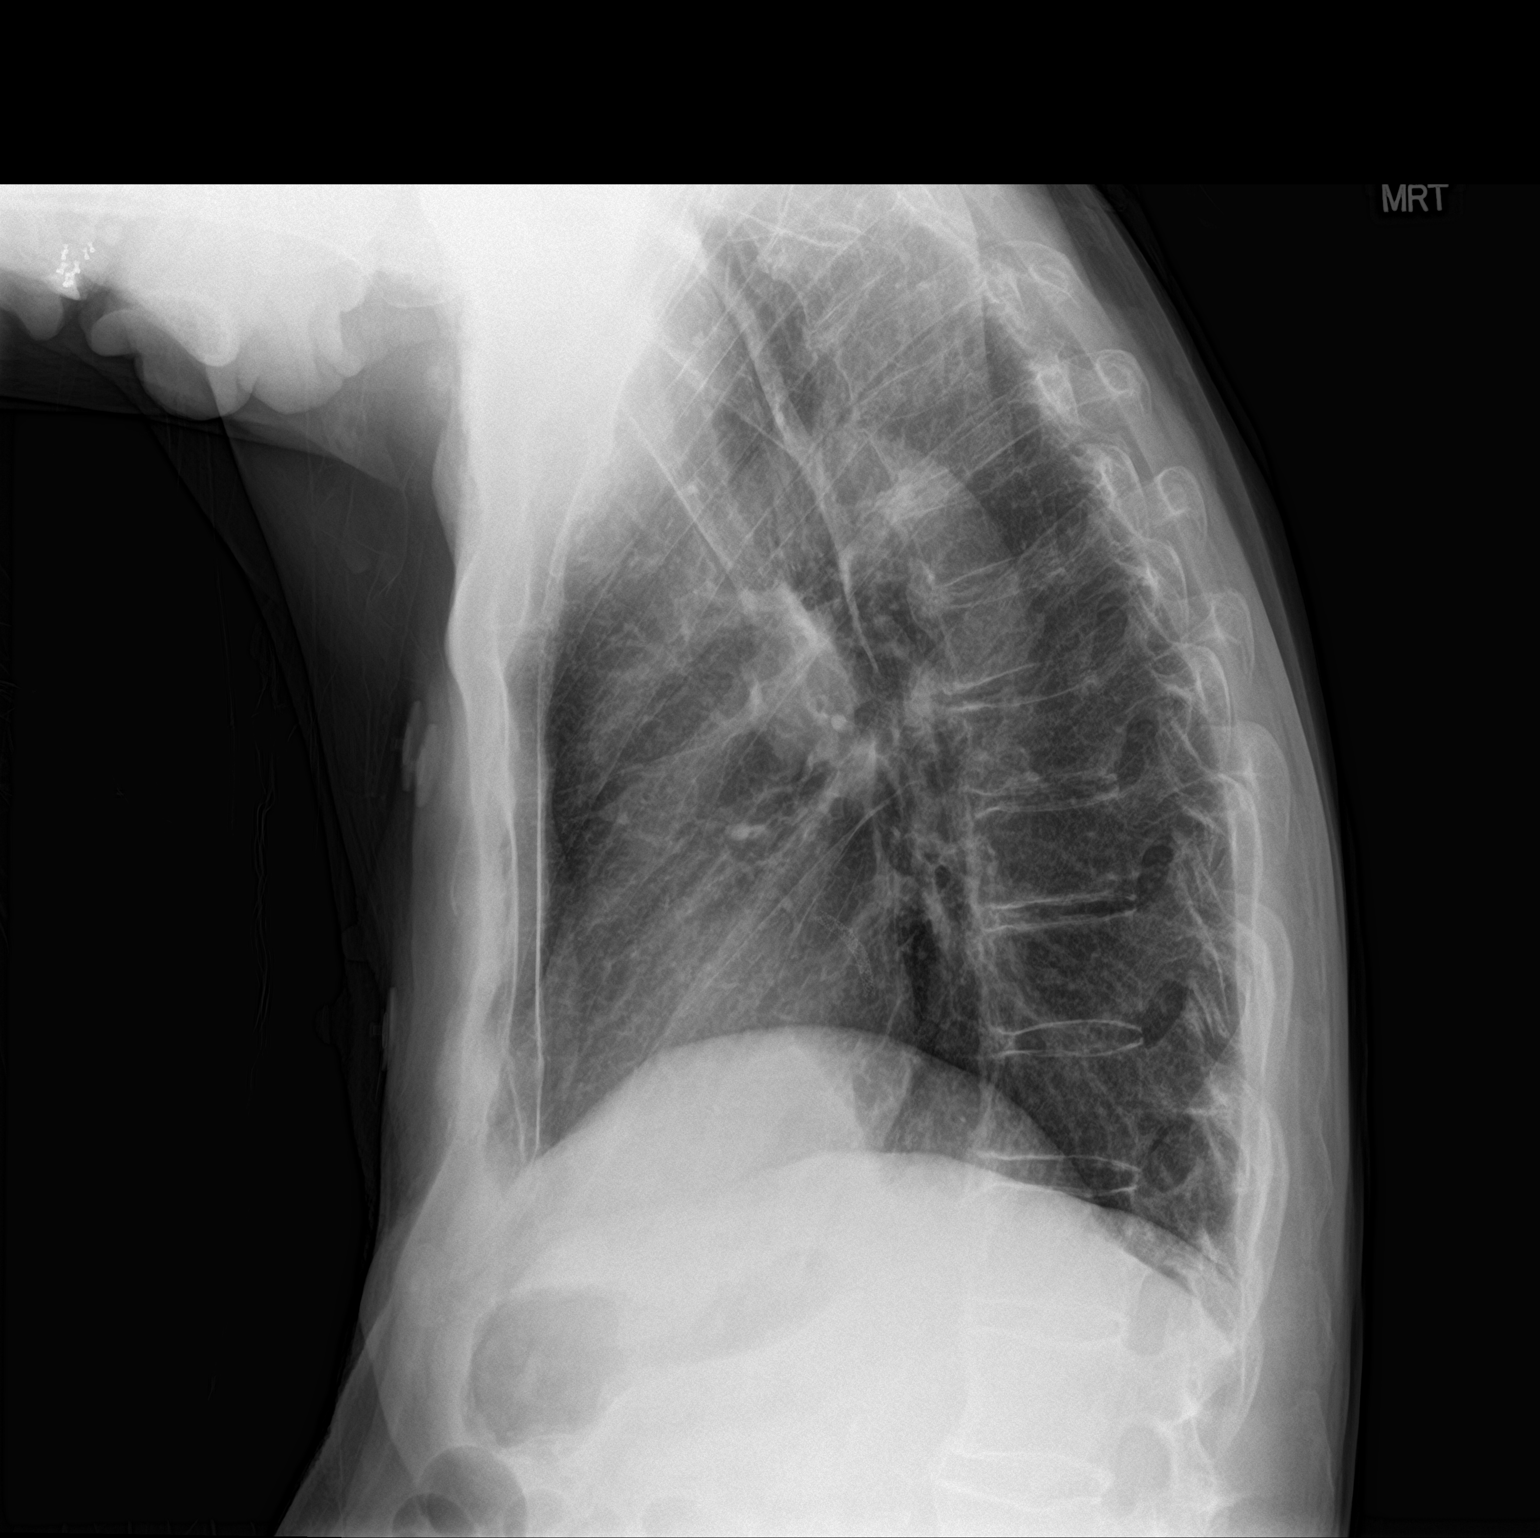

[2 of 2 positions shown; findings below may reference images not displayed]

FINDINGS: The heart size and mediastinal contours are within normal limits.
Both lungs are clear. The visualized skeletal structures are
unremarkable.
IMPRESSION: No active cardiopulmonary disease.

## 2018-04-28 ENCOUNTER — Encounter (HOSPITAL_COMMUNITY): Payer: PPO

## 2018-05-02 ENCOUNTER — Telehealth (HOSPITAL_COMMUNITY): Payer: Self-pay

## 2018-05-02 NOTE — Telephone Encounter (Signed)
Detailed instructions left for the patient on the answering machine. Asked to call back with any questions. S.Khalidah Herbold EMTP

## 2018-05-04 ENCOUNTER — Ambulatory Visit (HOSPITAL_COMMUNITY): Payer: PPO | Attending: Cardiology

## 2018-05-04 DIAGNOSIS — I25118 Atherosclerotic heart disease of native coronary artery with other forms of angina pectoris: Secondary | ICD-10-CM | POA: Diagnosis not present

## 2018-05-04 DIAGNOSIS — R072 Precordial pain: Secondary | ICD-10-CM | POA: Insufficient documentation

## 2018-05-04 DIAGNOSIS — E785 Hyperlipidemia, unspecified: Secondary | ICD-10-CM | POA: Insufficient documentation

## 2018-05-04 MED ORDER — TECHNETIUM TC 99M TETROFOSMIN IV KIT
31.9000 | PACK | Freq: Once | INTRAVENOUS | Status: AC | PRN
  Administered 2018-05-04: 31.9 via INTRAVENOUS
  Filled 2018-05-04: qty 32

## 2018-05-09 ENCOUNTER — Ambulatory Visit (HOSPITAL_COMMUNITY): Payer: PPO | Attending: Cardiovascular Disease

## 2018-05-09 DIAGNOSIS — E785 Hyperlipidemia, unspecified: Secondary | ICD-10-CM | POA: Diagnosis not present

## 2018-05-09 DIAGNOSIS — I25118 Atherosclerotic heart disease of native coronary artery with other forms of angina pectoris: Secondary | ICD-10-CM | POA: Diagnosis not present

## 2018-05-09 DIAGNOSIS — R072 Precordial pain: Secondary | ICD-10-CM | POA: Diagnosis not present

## 2018-05-09 LAB — MYOCARDIAL PERFUSION IMAGING
LV dias vol: 78 mL (ref 62–150)
LV sys vol: 49 mL
Peak HR: 139 {beats}/min
Rest HR: 90 {beats}/min
SDS: 1
SRS: 2
SSS: 1
TID: 1.17

## 2018-05-09 MED ORDER — REGADENOSON 0.4 MG/5ML IV SOLN
0.4000 mg | Freq: Once | INTRAVENOUS | Status: AC
  Administered 2018-05-09: 0.4 mg via INTRAVENOUS

## 2018-05-09 MED ORDER — TECHNETIUM TC 99M TETROFOSMIN IV KIT
30.8000 | PACK | Freq: Once | INTRAVENOUS | Status: AC | PRN
  Administered 2018-05-09: 30.8 via INTRAVENOUS
  Filled 2018-05-09: qty 31

## 2018-05-10 ENCOUNTER — Telehealth: Payer: Self-pay | Admitting: Cardiology

## 2018-05-10 DIAGNOSIS — I25118 Atherosclerotic heart disease of native coronary artery with other forms of angina pectoris: Secondary | ICD-10-CM

## 2018-05-10 DIAGNOSIS — I519 Heart disease, unspecified: Secondary | ICD-10-CM

## 2018-05-10 DIAGNOSIS — R931 Abnormal findings on diagnostic imaging of heart and coronary circulation: Secondary | ICD-10-CM

## 2018-05-10 DIAGNOSIS — I2510 Atherosclerotic heart disease of native coronary artery without angina pectoris: Secondary | ICD-10-CM

## 2018-05-10 NOTE — Telephone Encounter (Signed)
New message   Patient's wife is returning call for stress test results.

## 2018-05-10 NOTE — Telephone Encounter (Signed)
-----   Message from Dorothy Spark, MD sent at 05/10/2018 12:28 PM EST ----- No ischemia but overall decreased LVEF, I would repeat an echocardiogram.

## 2018-05-10 NOTE — Telephone Encounter (Signed)
Spoke with the pts wife [name] (primary caretaker and on DPR), and endorsed to her the pts echo and recommendations per Dr Meda Coffee, for him to have a repeat echo to reassess his LVEF.  Endorsed to the pts wife that I will place the order in the system and send a message to our [facility] schedulers to call the pts wife back and arrange this appt. Wife verbalized understanding and agrees with this plan.

## 2018-05-12 ENCOUNTER — Ambulatory Visit (HOSPITAL_COMMUNITY): Payer: PPO | Attending: Cardiovascular Disease

## 2018-05-12 DIAGNOSIS — I25118 Atherosclerotic heart disease of native coronary artery with other forms of angina pectoris: Secondary | ICD-10-CM | POA: Insufficient documentation

## 2018-05-12 DIAGNOSIS — R931 Abnormal findings on diagnostic imaging of heart and coronary circulation: Secondary | ICD-10-CM | POA: Insufficient documentation

## 2018-05-12 DIAGNOSIS — I2510 Atherosclerotic heart disease of native coronary artery without angina pectoris: Secondary | ICD-10-CM

## 2018-05-12 DIAGNOSIS — I519 Heart disease, unspecified: Secondary | ICD-10-CM | POA: Insufficient documentation

## 2018-05-22 ENCOUNTER — Other Ambulatory Visit: Payer: Self-pay

## 2018-05-22 ENCOUNTER — Emergency Department
Admission: EM | Admit: 2018-05-22 | Discharge: 2018-05-22 | Disposition: A | Discharge status: Home / Self Care | Payer: PPO | Attending: Emergency Medicine | Admitting: Emergency Medicine

## 2018-05-22 ENCOUNTER — Encounter: Payer: Self-pay | Admitting: Emergency Medicine

## 2018-05-22 ENCOUNTER — Emergency Department: Payer: PPO

## 2018-05-22 DIAGNOSIS — Z7982 Long term (current) use of aspirin: Secondary | ICD-10-CM | POA: Diagnosis not present

## 2018-05-22 DIAGNOSIS — K529 Noninfective gastroenteritis and colitis, unspecified: Secondary | ICD-10-CM | POA: Diagnosis not present

## 2018-05-22 DIAGNOSIS — Z87891 Personal history of nicotine dependence: Secondary | ICD-10-CM | POA: Insufficient documentation

## 2018-05-22 DIAGNOSIS — R05 Cough: Secondary | ICD-10-CM | POA: Diagnosis not present

## 2018-05-22 DIAGNOSIS — R0602 Shortness of breath: Secondary | ICD-10-CM | POA: Diagnosis not present

## 2018-05-22 DIAGNOSIS — Z79899 Other long term (current) drug therapy: Secondary | ICD-10-CM | POA: Diagnosis not present

## 2018-05-22 DIAGNOSIS — I10 Essential (primary) hypertension: Secondary | ICD-10-CM | POA: Insufficient documentation

## 2018-05-22 DIAGNOSIS — R000 Tachycardia, unspecified: Secondary | ICD-10-CM | POA: Diagnosis not present

## 2018-05-22 DIAGNOSIS — J449 Chronic obstructive pulmonary disease, unspecified: Secondary | ICD-10-CM | POA: Insufficient documentation

## 2018-05-22 DIAGNOSIS — E119 Type 2 diabetes mellitus without complications: Secondary | ICD-10-CM | POA: Diagnosis not present

## 2018-05-22 DIAGNOSIS — I252 Old myocardial infarction: Secondary | ICD-10-CM | POA: Insufficient documentation

## 2018-05-22 DIAGNOSIS — E1142 Type 2 diabetes mellitus with diabetic polyneuropathy: Secondary | ICD-10-CM | POA: Diagnosis not present

## 2018-05-22 DIAGNOSIS — I2510 Atherosclerotic heart disease of native coronary artery without angina pectoris: Secondary | ICD-10-CM | POA: Insufficient documentation

## 2018-05-22 DIAGNOSIS — R112 Nausea with vomiting, unspecified: Secondary | ICD-10-CM | POA: Diagnosis not present

## 2018-05-22 LAB — LACTIC ACID, PLASMA: LACTIC ACID, VENOUS: 3.3 mmol/L — AB (ref 0.5–1.9)

## 2018-05-22 LAB — CBC WITH DIFFERENTIAL/PLATELET
Abs Immature Granulocytes: 0.06 10*3/uL (ref 0.00–0.07)
Basophils Absolute: 0.1 10*3/uL (ref 0.0–0.1)
Basophils Relative: 1 %
Eosinophils Absolute: 0.1 10*3/uL (ref 0.0–0.5)
Eosinophils Relative: 1 %
HCT: 50.7 % (ref 39.0–52.0)
Hemoglobin: 16.7 g/dL (ref 13.0–17.0)
Immature Granulocytes: 1 %
Lymphocytes Relative: 9 %
Lymphs Abs: 1.1 10*3/uL (ref 0.7–4.0)
MCH: 28.5 pg (ref 26.0–34.0)
MCHC: 32.9 g/dL (ref 30.0–36.0)
MCV: 86.5 fL (ref 80.0–100.0)
MONO ABS: 1.2 10*3/uL — AB (ref 0.1–1.0)
Monocytes Relative: 9 %
Neutro Abs: 10.2 10*3/uL — ABNORMAL HIGH (ref 1.7–7.7)
Neutrophils Relative %: 79 %
Platelets: 325 10*3/uL (ref 150–400)
RBC: 5.86 MIL/uL — AB (ref 4.22–5.81)
RDW: 13.1 % (ref 11.5–15.5)
WBC: 12.7 10*3/uL — ABNORMAL HIGH (ref 4.0–10.5)
nRBC: 0 % (ref 0.0–0.2)

## 2018-05-22 LAB — COMPREHENSIVE METABOLIC PANEL
ALT: 9 U/L (ref 0–44)
AST: 18 U/L (ref 15–41)
Albumin: 4.3 g/dL (ref 3.5–5.0)
Alkaline Phosphatase: 109 U/L (ref 38–126)
Anion gap: 12 (ref 5–15)
BUN: 14 mg/dL (ref 8–23)
CHLORIDE: 102 mmol/L (ref 98–111)
CO2: 27 mmol/L (ref 22–32)
Calcium: 9.5 mg/dL (ref 8.9–10.3)
Creatinine, Ser: 0.96 mg/dL (ref 0.61–1.24)
GFR calc Af Amer: >60 mL/min (ref >60)
Glucose, Bld: 145 mg/dL — ABNORMAL HIGH (ref 70–99)
Potassium: 3.4 mmol/L — ABNORMAL LOW (ref 3.5–5.1)
Sodium: 141 mmol/L (ref 135–145)
Total Bilirubin: 2.1 mg/dL — ABNORMAL HIGH (ref 0.3–1.2)
Total Protein: 8.5 g/dL — ABNORMAL HIGH (ref 6.5–8.1)

## 2018-05-22 LAB — BRAIN NATRIURETIC PEPTIDE: B NATRIURETIC PEPTIDE 5: 49 pg/mL (ref 0.0–100.0)

## 2018-05-22 LAB — INFLUENZA PANEL BY PCR (TYPE A & B)
Influenza A By PCR: NEGATIVE
Influenza B By PCR: NEGATIVE

## 2018-05-22 LAB — TROPONIN I: Troponin I: 0.03 ng/mL (ref <0.03)

## 2018-05-22 MED ORDER — ONDANSETRON HCL 4 MG/2ML IJ SOLN
4.0000 mg | Freq: Once | INTRAMUSCULAR | Status: AC
  Administered 2018-05-22: 4 mg via INTRAVENOUS
  Filled 2018-05-22: qty 2

## 2018-05-22 MED ORDER — SODIUM CHLORIDE 0.9 % IV SOLN
1000.0000 mL | Freq: Once | INTRAVENOUS | Status: AC
  Administered 2018-05-22: 1000 mL via INTRAVENOUS

## 2018-05-22 MED ORDER — ONDANSETRON 4 MG PO TBDP: 4.0000 mg | ORAL_TABLET | Freq: Three times a day (TID) | ORAL | 0 refills | Status: AC | PRN

## 2018-05-22 NOTE — ED Provider Notes (Signed)
[facility] Emergency Department Provider Note   ____________________________________________    I have reviewed the triage vital signs and the nursing notes.   HISTORY  Chief Complaint Emesis and Fever     HPI [name] is a 78 y.o. male who presents with complaints of nausea vomiting, possible fevers as well as some shortness of breath.  Patient has a history of COPD, diabetes, Parkinson's.  Over the last 3 days he had nausea vomiting diarrhea, some myalgias.  Over the last 1 to 2 days he has had a productive cough and possibly fevers.  Denies chest pain.  No recent travel.  No calf pain or leg swelling.  Has not take anything for this.  Past Medical History:  Diagnosis Date  . Chest pain   . COPD (chronic obstructive pulmonary disease) ([place])   . Depression   . Diabetes ([place])   . Dysphagia   . Essential tremor   . Heart disease   . Hyperlipidemia   . Hypertension   . Seizure disorder ([place])   . Tardive dyskinesia   . Vitamin D deficiency     Patient Active Problem List   Diagnosis Date Noted  . Atherosclerosis of lower extremity with claudication ([place]) 09/13/2017  . COPD exacerbation ([place]) 08/09/2017  . Hypokalemia 06/20/2016  . Syncope   . CAD (coronary artery disease) 06/19/2016  . Status post coronary artery stent placement   . SVT (supraventricular tachycardia) ([place])   . NSVT (nonsustained ventricular tachycardia) ([place])   . NSTEMI (non-ST elevated myocardial infarction) ([place])   . Parkinson's variant of multiple system atrophy ([place]) 12/19/2015  . Encephalopathy chronic 12/14/2015  . Chest pain   . COPD (chronic obstructive pulmonary disease) ([place])   . Heart disease   . Seizure disorder ([place])   . Depression   . Tardive dyskinesia   . Dysphagia   . Skin lesion 08/14/2015  . Shortness of breath 08/14/2015  . Essential hypertension 08/14/2015  . Hyperlipidemia 08/14/2015  . Essential tremor 08/14/2015    Past  Surgical History:  Procedure Laterality Date  . CARDIAC CATHETERIZATION N/A 01/19/2016   Procedure: Left Heart Cath and Coronary Angiography;  Surgeon: Jolaine Artist, MD;  Location: [place] [facility];  Service: Cardiovascular;  Laterality: N/A;  . CARDIAC CATHETERIZATION  01/19/2016   Procedure: Coronary Stent Intervention;  Surgeon: Jettie Booze, MD;  Location: [place] [facility];  Service: Cardiovascular;;  . INGUINAL HERNIA REPAIR Left as child    Prior to Admission medications   Medication Sig Start Date End Date Taking? Authorizing Provider  aspirin EC 81 MG EC tablet Take 1 tablet (81 mg total) by mouth daily. 12/16/15   Regalado, Belkys A, MD  losartan (COZAAR) 100 MG tablet Take 1 tablet (100 mg total) by mouth daily. 03/27/18   Dorothy Spark, MD  metoprolol succinate (TOPROL-XL) 100 MG 24 hr tablet Take 100 mg by mouth daily. Take with or immediately following a meal.    [provider]  nitroGLYCERIN (NITROSTAT) 0.4 MG SL tablet Place 1 tablet (0.4 mg total) under the tongue every 5 (five) minutes x 3 doses as needed for chest pain. 01/21/16   Cheryln Manly, NP  ondansetron (ZOFRAN ODT) 4 MG disintegrating tablet Take 1 tablet (4 mg total) by mouth every 8 (eight) hours as needed for nausea or vomiting. 05/22/18   Lavonia Drafts, MD  rosuvastatin (CRESTOR) 5 MG tablet Take 1 tablet (5 mg total) by mouth  daily. 03/27/18   Dorothy Spark, MD     Allergies Levaquin [levofloxacin in d5w] and Penicillins  Family History  Problem Relation Age of Onset  . Lung cancer Father   . Emphysema Father   . Depression Mother   . CAD Brother   . Heart attack Brother        MI in his 81's    Social History Social History   Tobacco Use  . Smoking status: Former Smoker    Last attempt to quit: 09/04/2004    Years since quitting: 13.7  . Smokeless tobacco: Never Used  Substance Use Topics  . Alcohol use: No    Alcohol/week: 0.0 standard drinks  . Drug  use: No    Review of Systems  Constitutional: Fevers as above Eyes: No visual changes.  ENT: Mild sore throat Cardiovascular: Denies chest pain. Respiratory: Cough as above Gastrointestinal: As above Genitourinary: Negative for dysuria. Musculoskeletal: Negative for back pain. Skin: Negative for rash. Neurological: Negative for headaches    ____________________________________________   PHYSICAL EXAM:  VITAL SIGNS: ED Triage Vitals  Enc Vitals Group     BP 05/22/18 1019 (!) 165/93     Pulse Rate 05/22/18 1019 67     Resp 05/22/18 1019 (!) 30     Temp 05/22/18 1019 98.5 F (36.9 C)     Temp Source 05/22/18 1019 Oral     SpO2 05/22/18 1019 96 %     Weight 05/22/18 1020 68 kg (150 lb)     Height 05/22/18 1020 1.753 m (5\' 9" )     Head Circumference --      Peak Flow --      Pain Score 05/22/18 1019 0     Pain Loc --      Pain Edu? --      Excl. in [place]? --     Constitutional: Alert and oriented.  Eyes: Conjunctivae are normal.   Nose: No congestion/rhinnorhea. Mouth/Throat: Mucous membranes are moist.    Cardiovascular: Tachycardia, regular rhythm. Grossly normal heart sounds.  Good peripheral circulation. Respiratory: Normal respiratory effort.  No retractions. Lungs CTAB.  No wheezing Gastrointestinal: Soft and nontender. No distention.   Musculoskeletal: No lower extremity tenderness nor edema.  Warm and well perfused Neurologic:  Normal speech and language. No gross focal neurologic deficits are appreciated.  Skin:  Skin is warm, dry and intact. No rash noted. Psychiatric: Mood and affect are normal. Speech and behavior are normal.  ____________________________________________   LABS (all labs ordered are listed, but only abnormal results are displayed)  Labs Reviewed  LACTIC ACID, PLASMA - Abnormal; Notable for the following components:      Result Value   Lactic Acid, Venous 3.3 (*)    All other components within normal limits  COMPREHENSIVE  METABOLIC PANEL - Abnormal; Notable for the following components:   Potassium 3.4 (*)    Glucose, Bld 145 (*)    Total Protein 8.5 (*)    Total Bilirubin 2.1 (*)    All other components within normal limits  CBC WITH DIFFERENTIAL/PLATELET - Abnormal; Notable for the following components:   WBC 12.7 (*)    RBC 5.86 (*)    Neutro Abs 10.2 (*)    Monocytes Absolute 1.2 (*)    All other components within normal limits  TROPONIN I - Abnormal; Notable for the following components:   Troponin I 0.03 (*)    All other components within normal limits  BRAIN NATRIURETIC PEPTIDE  INFLUENZA  PANEL BY PCR (TYPE A & B)  LACTIC ACID, PLASMA   ____________________________________________  EKG  ED ECG REPORT I, Lavonia Drafts, the attending physician, personally viewed and interpreted this ECG.  Date: 05/22/2018  Rhythm: Tachycardia QRS Axis: normal Intervals: Abnormal ST/T Wave abnormalities: Abnormal  EKG significantly affected by artifact, patient's heart rate on exam is only 105 ____________________________________________  RADIOLOGY  Chest x-ray unremarkable ____________________________________________   PROCEDURES  Procedure(s) performed: No  Procedures   Critical Care performed: No ____________________________________________   INITIAL IMPRESSION / ASSESSMENT AND PLAN / ED COURSE  Pertinent labs & imaging results that were available during my care of the patient were reviewed by me and considered in my medical decision making (see chart for details).  Patient presents with cough, mild shortness of breath, also with recent nausea vomiting diarrhea suggestive of viral gastroenteritis possibly complicated by viral cough versus pneumonia, possible influenza.  Pending labs chest x-ray, will give IV fluids IV Zofran and reevaluate  Chest x-ray negative for pneumonia  Lab work significant for mildly elevated white blood cell count, elevated lactic acid.  Troponin 0  0.03  Discussed with the patient that I recommend admission given elevated lactic and symptoms described however patient does not want stay in the hospital he wants to go home, he states he feels much better after fluids and Zofran.  Patient became upset at even the suggestion of admission.  He does have decisional capacity.  He and his wife both want him to be sent home, his wife states she will bring him back if any worsening of his condition.    ____________________________________________   FINAL CLINICAL IMPRESSION(S) / ED DIAGNOSES  Final diagnoses:  Gastroenteritis        Note:  This document was prepared using Dragon voice recognition software and may include unintentional dictation errors.   Lavonia Drafts, MD 05/22/18 1344

## 2018-05-22 NOTE — ED Notes (Signed)
Pt is being discharged to home with wife. Aox4, VSS, pt does not c/o any pain at this time. AVS/RX was given and explained to the pt and they verbalized understanding of all information.

## 2018-05-22 NOTE — ED Triage Notes (Signed)
Pt has been feeling bad since Friday per wife.  Has had green productive cough. Has had vomiting.  C/o SHOB.  labored/exaggerated breaths noted.  Appears to feel bad. Fevers up to 101 at home.  VSS at this time.

## 2018-06-21 IMAGING — CT CT RENAL STONE PROTOCOL
2 of 4 series · 15 of 46 positions shown, 17 images · non-contrast
Comparison: [DATE]st 2120

CLINICAL DATA: Acute right flank pain.

EXAM:
CT ABDOMEN AND PELVIS WITHOUT CONTRAST
TECHNIQUE: Multidetector CT imaging of the abdomen and pelvis was performed
following the standard protocol without IV contrast.

[Series 2: stone full standard · axial · 0.72mm/px · z∈[-539,-99]mm · 12 of 97 slices shown, 14 images]
[im 5/97  soft-tissue]
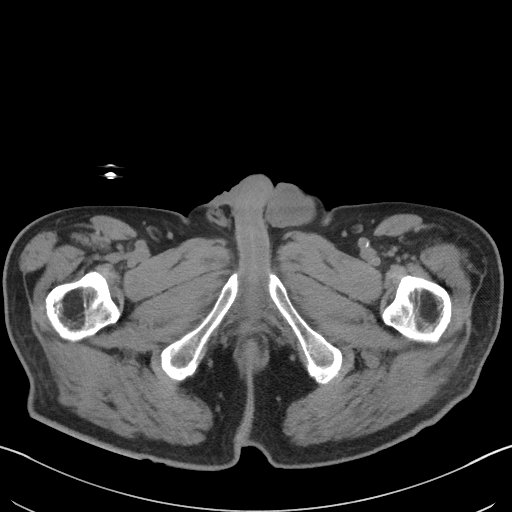
[im 5/97  bone]
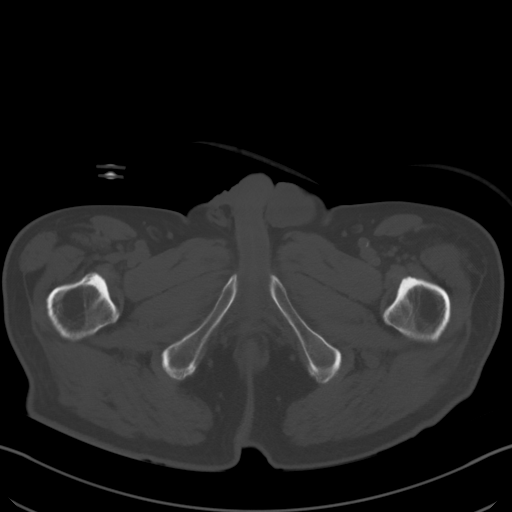
[im 13/97  soft-tissue]
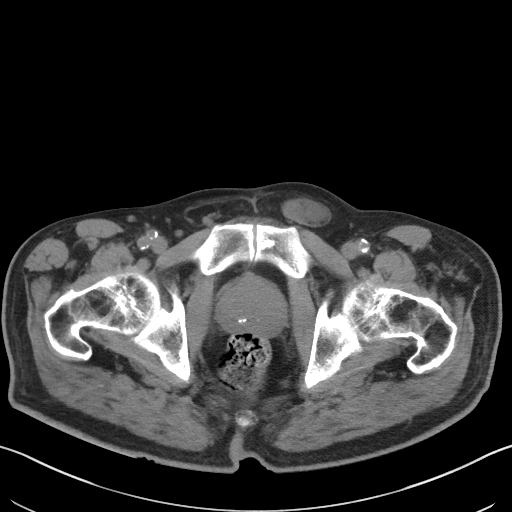
[im 21/97  soft-tissue]
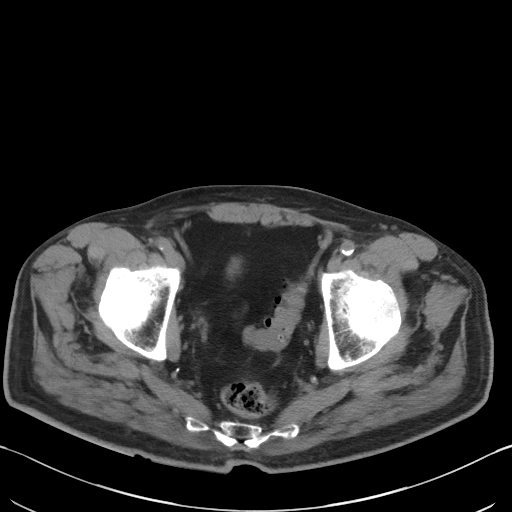
[im 29/97  soft-tissue]
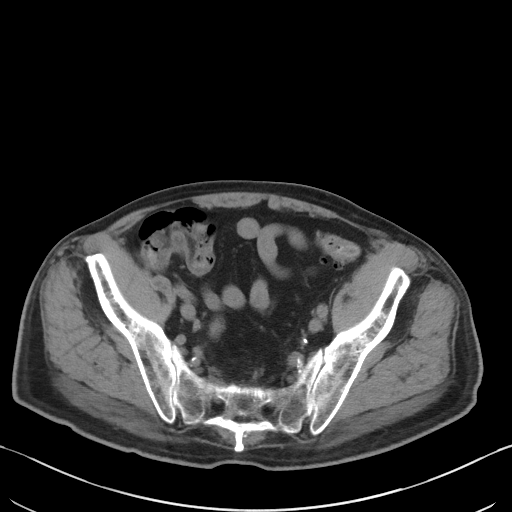
[im 37/97  soft-tissue]
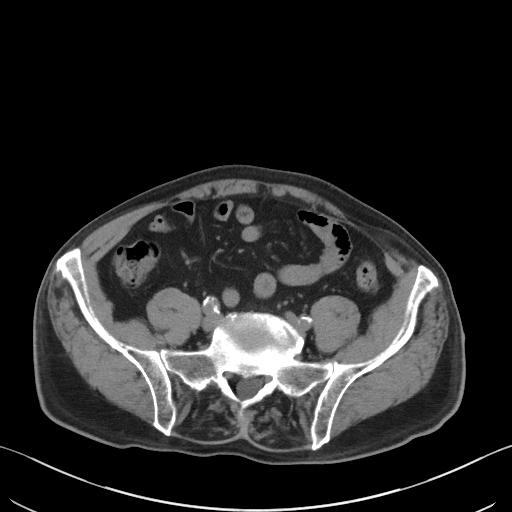
[im 45/97  soft-tissue]
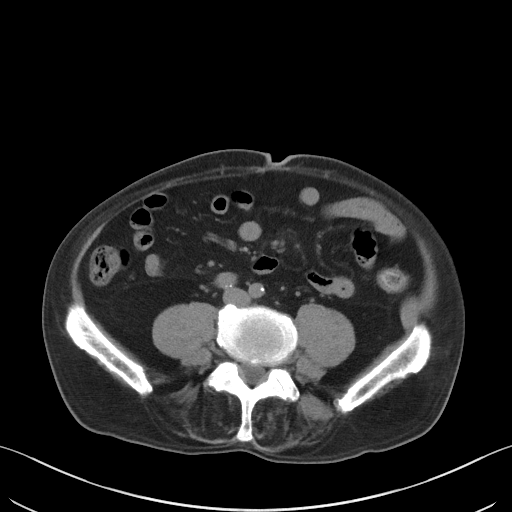
[im 53/97  soft-tissue]
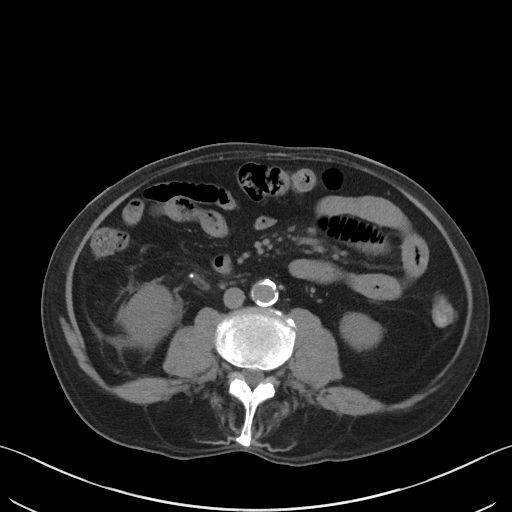
[im 61/97  soft-tissue]
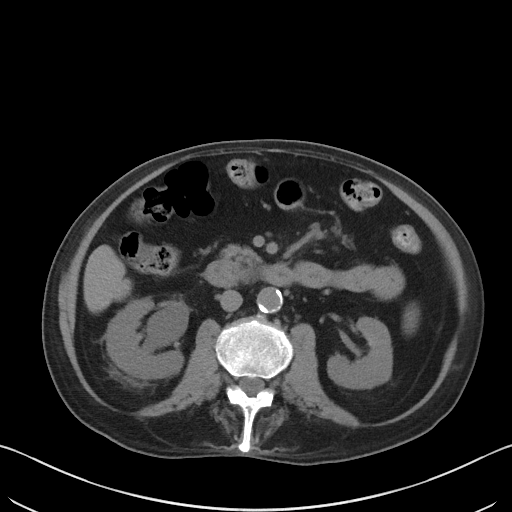
[im 69/97  soft-tissue]
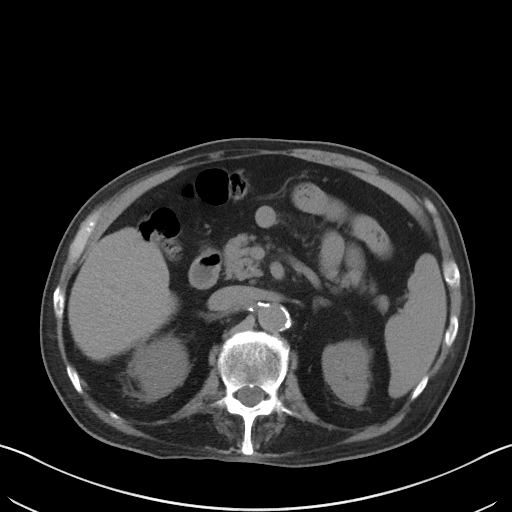
[im 69/97  bone]
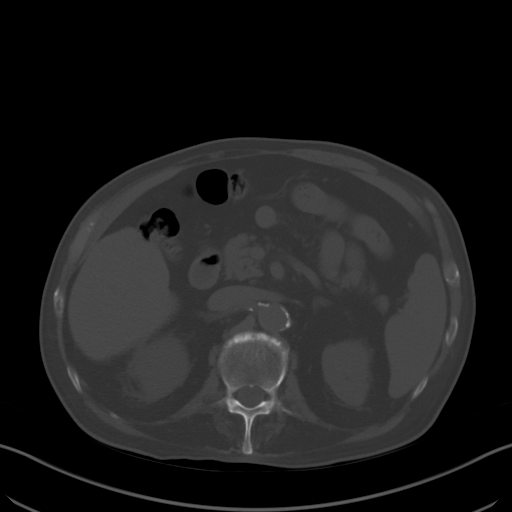
[im 77/97  soft-tissue]
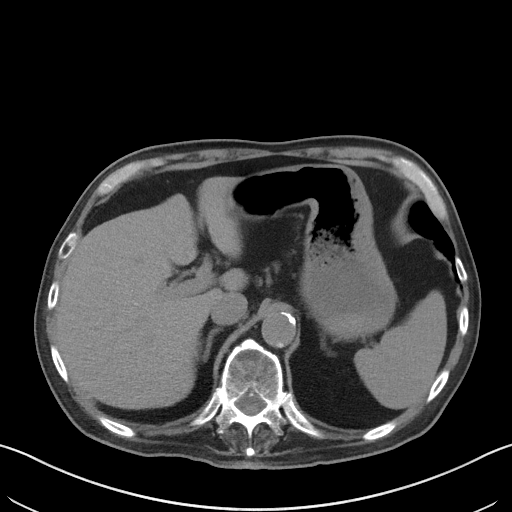
[im 85/97  soft-tissue]
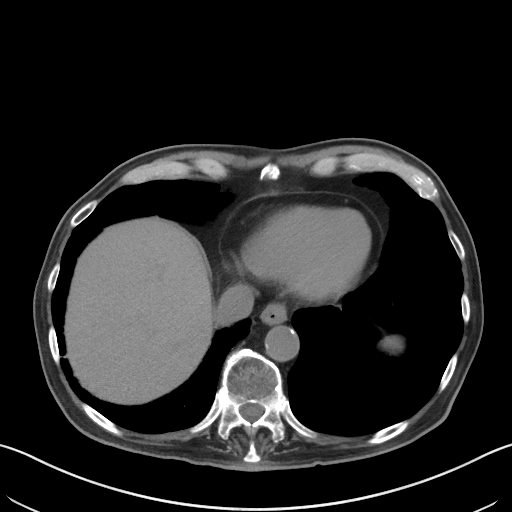
[im 93/97  soft-tissue]
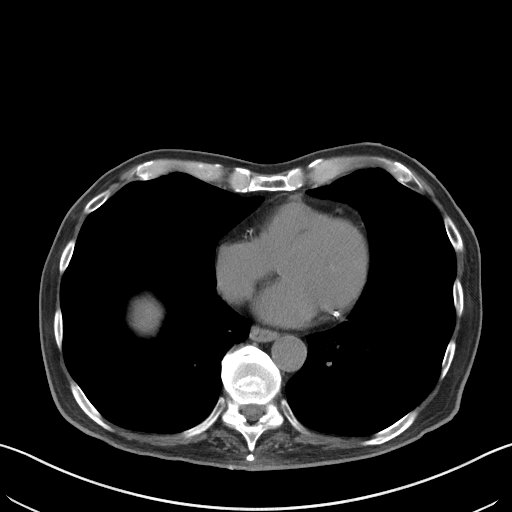

[Series 5: coronal · coronal · 0.64mm/px · 3 of 116 slices shown]
[im 39/116  soft-tissue]
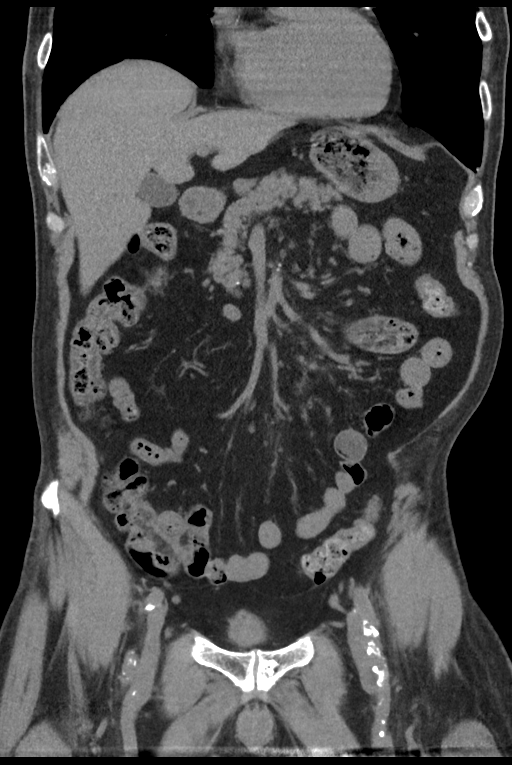
[im 52/116  soft-tissue]
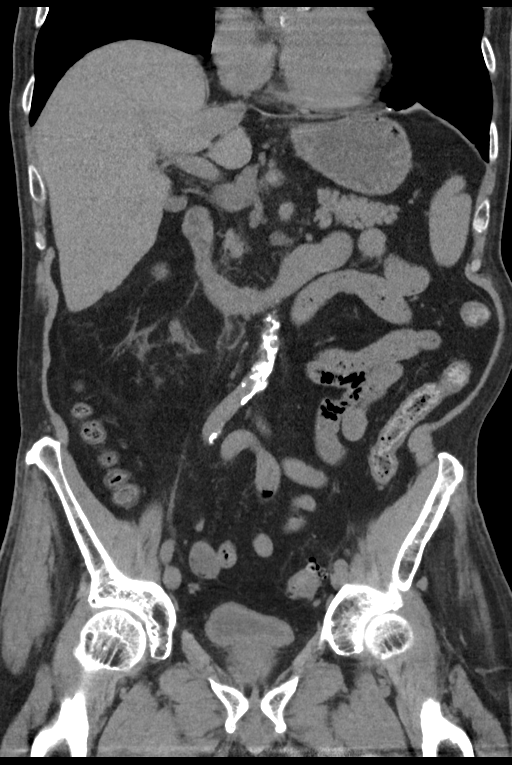
[im 64/116  soft-tissue]
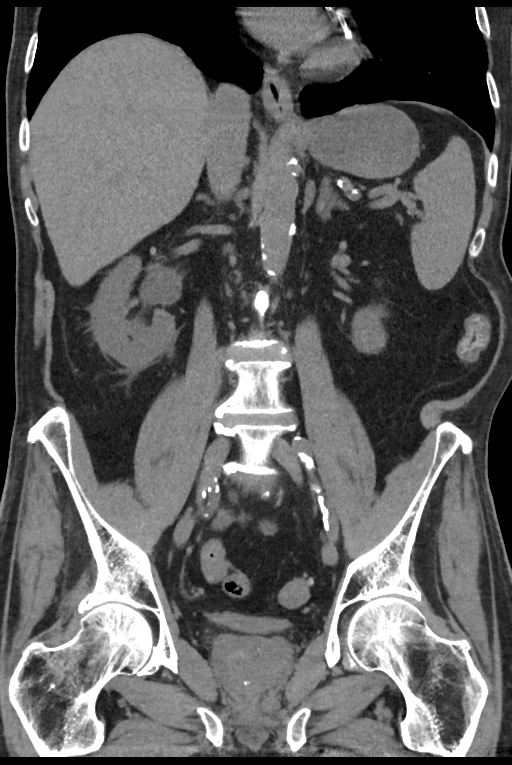

[15 of 46 positions shown; findings below may reference images not displayed]

FINDINGS: Lower chest: No acute abnormality.

Hepatobiliary: No focal liver abnormality is seen. No gallstones,
gallbladder wall thickening, or biliary dilatation.

Pancreas: Unremarkable. No pancreatic ductal dilatation or
surrounding inflammatory changes.

Spleen: Normal in size without focal abnormality.

Adrenals/Urinary Tract: No renal stones. Hydronephrosis and
perinephric stranding on the right with dilatation of the proximal
right ureter to the level of a 3 mm ureteral stone. Distal to the
stone, the ureter is decompressed and normal. The left kidney,
ureter, and bladder are unremarkable.

Stomach/Bowel: The stomach and small bowel are normal. Colonic
diverticulosis is seen without diverticulitis. The colon is
otherwise normal. The appendix is normal.

Vascular/Lymphatic: Atherosclerosis is seen in the non aneurysmal
abdominal aorta, iliac vessels and femoral vessels. No adenopathy.

Reproductive: Prostate is unremarkable.

Other: There is a left inguinal hernia. Expansion of the inguinal
canal on image 90 contains both fluid and soft tissue attenuation.
The soft tissue attenuation may simply represent prominent vessels.

Musculoskeletal: No acute or significant osseous findings.
IMPRESSION: 1. Obstructing 3 mm stone in the proximal right ureter.
2. Left inguinal hernia. Expansion of the left inguinal canal with
fluid an soft tissue. The small tissue may simply represent
prominent vessels. An ultrasound may better evaluate if clinically
warranted.
3. Atherosclerosis.

## 2018-06-22 DIAGNOSIS — R1084 Generalized abdominal pain: Secondary | ICD-10-CM | POA: Diagnosis not present

## 2018-06-22 DIAGNOSIS — R05 Cough: Secondary | ICD-10-CM | POA: Diagnosis not present

## 2018-06-26 ENCOUNTER — Ambulatory Visit: Payer: PPO | Admitting: Cardiology

## 2018-06-27 ENCOUNTER — Encounter: Payer: Self-pay | Admitting: Cardiology

## 2020-03-25 ENCOUNTER — Inpatient Hospital Stay
Admission: EM | Admit: 2020-03-25 | Discharge: 2020-04-12 | DRG: 871 | Disposition: E | Payer: PPO | Attending: Internal Medicine | Admitting: Internal Medicine

## 2020-03-25 ENCOUNTER — Other Ambulatory Visit: Payer: Self-pay

## 2020-03-25 ENCOUNTER — Emergency Department: Payer: PPO

## 2020-03-25 DIAGNOSIS — R6521 Severe sepsis with septic shock: Secondary | ICD-10-CM | POA: Diagnosis present

## 2020-03-25 DIAGNOSIS — E871 Hypo-osmolality and hyponatremia: Secondary | ICD-10-CM | POA: Diagnosis present

## 2020-03-25 DIAGNOSIS — R6520 Severe sepsis without septic shock: Secondary | ICD-10-CM | POA: Diagnosis not present

## 2020-03-25 DIAGNOSIS — I110 Hypertensive heart disease with heart failure: Secondary | ICD-10-CM | POA: Diagnosis present

## 2020-03-25 DIAGNOSIS — R7989 Other specified abnormal findings of blood chemistry: Secondary | ICD-10-CM | POA: Diagnosis present

## 2020-03-25 DIAGNOSIS — B9561 Methicillin susceptible Staphylococcus aureus infection as the cause of diseases classified elsewhere: Secondary | ICD-10-CM

## 2020-03-25 DIAGNOSIS — J15211 Pneumonia due to Methicillin susceptible Staphylococcus aureus: Secondary | ICD-10-CM | POA: Diagnosis present

## 2020-03-25 DIAGNOSIS — Z681 Body mass index (BMI) 19 or less, adult: Secondary | ICD-10-CM

## 2020-03-25 DIAGNOSIS — J9600 Acute respiratory failure, unspecified whether with hypoxia or hypercapnia: Secondary | ICD-10-CM | POA: Diagnosis not present

## 2020-03-25 DIAGNOSIS — G232 Striatonigral degeneration: Secondary | ICD-10-CM | POA: Diagnosis present

## 2020-03-25 DIAGNOSIS — E870 Hyperosmolality and hypernatremia: Secondary | ICD-10-CM | POA: Diagnosis present

## 2020-03-25 DIAGNOSIS — I4892 Unspecified atrial flutter: Secondary | ICD-10-CM | POA: Diagnosis present

## 2020-03-25 DIAGNOSIS — A4189 Other specified sepsis: Secondary | ICD-10-CM | POA: Diagnosis present

## 2020-03-25 DIAGNOSIS — J449 Chronic obstructive pulmonary disease, unspecified: Secondary | ICD-10-CM | POA: Diagnosis present

## 2020-03-25 DIAGNOSIS — U071 COVID-19: Secondary | ICD-10-CM | POA: Diagnosis present

## 2020-03-25 DIAGNOSIS — Z818 Family history of other mental and behavioral disorders: Secondary | ICD-10-CM

## 2020-03-25 DIAGNOSIS — E861 Hypovolemia: Secondary | ICD-10-CM | POA: Diagnosis present

## 2020-03-25 DIAGNOSIS — D62 Acute posthemorrhagic anemia: Secondary | ICD-10-CM | POA: Diagnosis not present

## 2020-03-25 DIAGNOSIS — K922 Gastrointestinal hemorrhage, unspecified: Secondary | ICD-10-CM | POA: Diagnosis not present

## 2020-03-25 DIAGNOSIS — R0602 Shortness of breath: Secondary | ICD-10-CM

## 2020-03-25 DIAGNOSIS — A419 Sepsis, unspecified organism: Secondary | ICD-10-CM | POA: Diagnosis not present

## 2020-03-25 DIAGNOSIS — G9349 Other encephalopathy: Secondary | ICD-10-CM | POA: Diagnosis present

## 2020-03-25 DIAGNOSIS — J1281 Pneumonia due to SARS-associated coronavirus: Secondary | ICD-10-CM | POA: Diagnosis not present

## 2020-03-25 DIAGNOSIS — R008 Other abnormalities of heart beat: Secondary | ICD-10-CM | POA: Diagnosis present

## 2020-03-25 DIAGNOSIS — E785 Hyperlipidemia, unspecified: Secondary | ICD-10-CM | POA: Diagnosis present

## 2020-03-25 DIAGNOSIS — I5022 Chronic systolic (congestive) heart failure: Secondary | ICD-10-CM | POA: Diagnosis present

## 2020-03-25 DIAGNOSIS — Z7189 Other specified counseling: Secondary | ICD-10-CM | POA: Diagnosis not present

## 2020-03-25 DIAGNOSIS — Z66 Do not resuscitate: Secondary | ICD-10-CM

## 2020-03-25 DIAGNOSIS — A4101 Sepsis due to Methicillin susceptible Staphylococcus aureus: Secondary | ICD-10-CM | POA: Diagnosis present

## 2020-03-25 DIAGNOSIS — J440 Chronic obstructive pulmonary disease with acute lower respiratory infection: Secondary | ICD-10-CM | POA: Diagnosis present

## 2020-03-25 DIAGNOSIS — I2510 Atherosclerotic heart disease of native coronary artery without angina pectoris: Secondary | ICD-10-CM | POA: Diagnosis present

## 2020-03-25 DIAGNOSIS — E0590 Thyrotoxicosis, unspecified without thyrotoxic crisis or storm: Secondary | ICD-10-CM | POA: Diagnosis present

## 2020-03-25 DIAGNOSIS — J1282 Pneumonia due to coronavirus disease 2019: Secondary | ICD-10-CM | POA: Diagnosis present

## 2020-03-25 DIAGNOSIS — E538 Deficiency of other specified B group vitamins: Secondary | ICD-10-CM | POA: Diagnosis present

## 2020-03-25 DIAGNOSIS — I25118 Atherosclerotic heart disease of native coronary artery with other forms of angina pectoris: Secondary | ICD-10-CM | POA: Diagnosis present

## 2020-03-25 DIAGNOSIS — G9341 Metabolic encephalopathy: Secondary | ICD-10-CM | POA: Diagnosis present

## 2020-03-25 DIAGNOSIS — Z801 Family history of malignant neoplasm of trachea, bronchus and lung: Secondary | ICD-10-CM

## 2020-03-25 DIAGNOSIS — Z955 Presence of coronary angioplasty implant and graft: Secondary | ICD-10-CM

## 2020-03-25 DIAGNOSIS — I471 Supraventricular tachycardia, unspecified: Secondary | ICD-10-CM

## 2020-03-25 DIAGNOSIS — J181 Lobar pneumonia, unspecified organism: Secondary | ICD-10-CM | POA: Diagnosis not present

## 2020-03-25 DIAGNOSIS — R64 Cachexia: Secondary | ICD-10-CM | POA: Diagnosis present

## 2020-03-25 DIAGNOSIS — I4891 Unspecified atrial fibrillation: Secondary | ICD-10-CM

## 2020-03-25 DIAGNOSIS — R404 Transient alteration of awareness: Secondary | ICD-10-CM | POA: Diagnosis not present

## 2020-03-25 DIAGNOSIS — R627 Adult failure to thrive: Secondary | ICD-10-CM | POA: Diagnosis not present

## 2020-03-25 DIAGNOSIS — Z79899 Other long term (current) drug therapy: Secondary | ICD-10-CM

## 2020-03-25 DIAGNOSIS — R9431 Abnormal electrocardiogram [ECG] [EKG]: Secondary | ICD-10-CM | POA: Diagnosis not present

## 2020-03-25 DIAGNOSIS — I252 Old myocardial infarction: Secondary | ICD-10-CM

## 2020-03-25 DIAGNOSIS — G40909 Epilepsy, unspecified, not intractable, without status epilepticus: Secondary | ICD-10-CM | POA: Diagnosis present

## 2020-03-25 DIAGNOSIS — J80 Acute respiratory distress syndrome: Secondary | ICD-10-CM | POA: Diagnosis not present

## 2020-03-25 DIAGNOSIS — I493 Ventricular premature depolarization: Secondary | ICD-10-CM | POA: Diagnosis present

## 2020-03-25 DIAGNOSIS — I10 Essential (primary) hypertension: Secondary | ICD-10-CM | POA: Diagnosis present

## 2020-03-25 DIAGNOSIS — E43 Unspecified severe protein-calorie malnutrition: Secondary | ICD-10-CM | POA: Diagnosis present

## 2020-03-25 DIAGNOSIS — R7881 Bacteremia: Secondary | ICD-10-CM

## 2020-03-25 DIAGNOSIS — R0902 Hypoxemia: Secondary | ICD-10-CM

## 2020-03-25 DIAGNOSIS — J9621 Acute and chronic respiratory failure with hypoxia: Secondary | ICD-10-CM | POA: Diagnosis present

## 2020-03-25 DIAGNOSIS — J90 Pleural effusion, not elsewhere classified: Secondary | ICD-10-CM | POA: Diagnosis not present

## 2020-03-25 DIAGNOSIS — J159 Unspecified bacterial pneumonia: Secondary | ICD-10-CM

## 2020-03-25 DIAGNOSIS — J189 Pneumonia, unspecified organism: Secondary | ICD-10-CM | POA: Diagnosis not present

## 2020-03-25 DIAGNOSIS — E119 Type 2 diabetes mellitus without complications: Secondary | ICD-10-CM | POA: Diagnosis present

## 2020-03-25 DIAGNOSIS — R000 Tachycardia, unspecified: Secondary | ICD-10-CM | POA: Diagnosis not present

## 2020-03-25 DIAGNOSIS — E860 Dehydration: Secondary | ICD-10-CM | POA: Diagnosis present

## 2020-03-25 DIAGNOSIS — Z87891 Personal history of nicotine dependence: Secondary | ICD-10-CM

## 2020-03-25 DIAGNOSIS — R0689 Other abnormalities of breathing: Secondary | ICD-10-CM | POA: Diagnosis not present

## 2020-03-25 DIAGNOSIS — I472 Ventricular tachycardia: Secondary | ICD-10-CM | POA: Diagnosis present

## 2020-03-25 DIAGNOSIS — R197 Diarrhea, unspecified: Secondary | ICD-10-CM | POA: Diagnosis present

## 2020-03-25 DIAGNOSIS — G20 Parkinson's disease: Secondary | ICD-10-CM | POA: Diagnosis present

## 2020-03-25 DIAGNOSIS — Z880 Allergy status to penicillin: Secondary | ICD-10-CM

## 2020-03-25 DIAGNOSIS — Z8249 Family history of ischemic heart disease and other diseases of the circulatory system: Secondary | ICD-10-CM

## 2020-03-25 DIAGNOSIS — D649 Anemia, unspecified: Secondary | ICD-10-CM | POA: Diagnosis not present

## 2020-03-25 DIAGNOSIS — E872 Acidosis: Secondary | ICD-10-CM | POA: Diagnosis present

## 2020-03-25 DIAGNOSIS — G9340 Encephalopathy, unspecified: Secondary | ICD-10-CM | POA: Diagnosis not present

## 2020-03-25 DIAGNOSIS — D72829 Elevated white blood cell count, unspecified: Secondary | ICD-10-CM | POA: Diagnosis not present

## 2020-03-25 DIAGNOSIS — Z7982 Long term (current) use of aspirin: Secondary | ICD-10-CM

## 2020-03-25 DIAGNOSIS — Z881 Allergy status to other antibiotic agents status: Secondary | ICD-10-CM

## 2020-03-25 DIAGNOSIS — Z825 Family history of asthma and other chronic lower respiratory diseases: Secondary | ICD-10-CM

## 2020-03-25 DIAGNOSIS — R1310 Dysphagia, unspecified: Secondary | ICD-10-CM | POA: Diagnosis present

## 2020-03-25 DIAGNOSIS — I700 Atherosclerosis of aorta: Secondary | ICD-10-CM | POA: Diagnosis not present

## 2020-03-25 DIAGNOSIS — Z515 Encounter for palliative care: Secondary | ICD-10-CM | POA: Diagnosis not present

## 2020-03-25 DIAGNOSIS — R54 Age-related physical debility: Secondary | ICD-10-CM | POA: Diagnosis present

## 2020-03-25 LAB — CBC WITH DIFFERENTIAL/PLATELET
Abs Immature Granulocytes: 0.13 10*3/uL — ABNORMAL HIGH (ref 0.00–0.07)
Basophils Absolute: 0 10*3/uL (ref 0.0–0.1)
Basophils Relative: 0 %
Eosinophils Absolute: 0 10*3/uL (ref 0.0–0.5)
Eosinophils Relative: 0 %
HCT: 55.2 % — ABNORMAL HIGH (ref 39.0–52.0)
Hemoglobin: 18.3 g/dL — ABNORMAL HIGH (ref 13.0–17.0)
Immature Granulocytes: 1 %
Lymphocytes Relative: 7 %
Lymphs Abs: 0.8 10*3/uL (ref 0.7–4.0)
MCH: 29.9 pg (ref 26.0–34.0)
MCHC: 33.2 g/dL (ref 30.0–36.0)
MCV: 90.2 fL (ref 80.0–100.0)
Monocytes Absolute: 1.2 10*3/uL — ABNORMAL HIGH (ref 0.1–1.0)
Monocytes Relative: 9 %
Neutro Abs: 10.1 10*3/uL — ABNORMAL HIGH (ref 1.7–7.7)
Neutrophils Relative %: 83 %
Platelets: 302 10*3/uL (ref 150–400)
RBC: 6.12 MIL/uL — ABNORMAL HIGH (ref 4.22–5.81)
RDW: 14.1 % (ref 11.5–15.5)
WBC: 12.2 10*3/uL — ABNORMAL HIGH (ref 4.0–10.5)
nRBC: 0 % (ref 0.0–0.2)

## 2020-03-25 LAB — COMPREHENSIVE METABOLIC PANEL
ALT: 16 U/L (ref 0–44)
AST: 45 U/L — ABNORMAL HIGH (ref 15–41)
Albumin: 3.2 g/dL — ABNORMAL LOW (ref 3.5–5.0)
Alkaline Phosphatase: 61 U/L (ref 38–126)
Anion gap: 20 — ABNORMAL HIGH (ref 5–15)
BUN: 45 mg/dL — ABNORMAL HIGH (ref 8–23)
CO2: 26 mmol/L (ref 22–32)
Calcium: 8.6 mg/dL — ABNORMAL LOW (ref 8.9–10.3)
Chloride: 106 mmol/L (ref 98–111)
Creatinine, Ser: 1.21 mg/dL (ref 0.61–1.24)
GFR, Estimated: >60 mL/min (ref >60)
Glucose, Bld: 129 mg/dL — ABNORMAL HIGH (ref 70–99)
Potassium: 3.9 mmol/L (ref 3.5–5.1)
Sodium: 152 mmol/L — ABNORMAL HIGH (ref 135–145)
Total Bilirubin: 2.5 mg/dL — ABNORMAL HIGH (ref 0.3–1.2)
Total Protein: 7.7 g/dL (ref 6.5–8.1)

## 2020-03-25 LAB — PROTIME-INR
INR: 1 (ref 0.8–1.2)
Prothrombin Time: 13.1 seconds (ref 11.4–15.2)

## 2020-03-25 LAB — FIBRIN DERIVATIVES D-DIMER (ARMC ONLY): Fibrin derivatives D-dimer (ARMC): 1824.31 ng/mL (FEU) — ABNORMAL HIGH (ref 0.00–499.00)

## 2020-03-25 LAB — LACTIC ACID, PLASMA: Lactic Acid, Venous: 7.4 mmol/L (ref 0.5–1.9)

## 2020-03-25 LAB — RESP PANEL BY RT-PCR (FLU A&B, COVID) ARPGX2
Influenza A by PCR: NEGATIVE
Influenza B by PCR: NEGATIVE
SARS Coronavirus 2 by RT PCR: POSITIVE — AB

## 2020-03-25 LAB — APTT: aPTT: <24 seconds — ABNORMAL LOW (ref 24–36)

## 2020-03-25 MED ORDER — LACTATED RINGERS IV SOLN: INTRAVENOUS | Status: DC

## 2020-03-25 MED ORDER — SODIUM CHLORIDE 0.9 % IV BOLUS (SEPSIS)
1000.0000 mL | Freq: Once | INTRAVENOUS | Status: AC
  Administered 2020-03-25: 23:00:00 1000 mL via INTRAVENOUS

## 2020-03-25 MED ORDER — LEVOFLOXACIN IN D5W 750 MG/150ML IV SOLN
750.0000 mg | Freq: Once | INTRAVENOUS | Status: AC
  Administered 2020-03-25: 23:00:00 750 mg via INTRAVENOUS
  Filled 2020-03-25: qty 150

## 2020-03-25 MED ORDER — SODIUM CHLORIDE 0.9 % IV SOLN: Freq: Once | INTRAVENOUS | Status: AC

## 2020-03-25 MED ORDER — ONDANSETRON HCL 4 MG/2ML IJ SOLN
4.0000 mg | Freq: Once | INTRAMUSCULAR | Status: AC
  Administered 2020-03-25: 23:00:00 4 mg via INTRAVENOUS
  Filled 2020-03-25: qty 2

## 2020-03-25 NOTE — ED Provider Notes (Addendum)
-----------------------------------------   11:15 PM on 03/31/2020 -----------------------------------------  Blood pressure (!) 178/97, pulse 92, temperature (!) 97.2 F (36.2 C), temperature source Oral, resp. rate (!) 40, height 5\' 10"  (1.778 m), weight 59 kg, SpO2 100 %.  Assuming care from Dr. Cinda Quest.  In short, [name] is a 79 y.o. male with a chief complaint of Shortness of Breath .  Refer to the original H&P for additional details.  The current plan of care is to follow-up lab results for hypoxic respiratory failure, presumed COVID.  ED ECG REPORT I, Blake Divine, the attending physician, personally viewed and interpreted this ECG.   Date: 03/26/2020  EKG Time: 00:34  Rate: 118  Rhythm: atrial fibrillation, rate 118  Axis: Normal  Intervals:none  ST&T Change: Nonspecific ST/T changes  ----------------------------------------- 1:13 AM on 03/26/2020 -----------------------------------------  Testing for COVID-19 is positive, explaining patient's hypoxic respiratory failure.  He remained stable on 4 L nasal cannula.  Repeat EKG was performed and is consistent with atrial fibrillation with RVR, no ischemic changes noted.  Labs also show markedly elevated lactic acid and elevated BUN to creatinine ratio.  This seems most consistent with dehydration versus sepsis, patient received Levaquin for possible pneumonia and we will continue IV fluid hydration, repeat lactate.  Given he remains tachycardic after 1.5 L of fluid, we will use IV metoprolol as needed for heart rate control.  Chest x-ray reviewed by me and shows multifocal infiltrates consistent with COVID-19, we will treat with IV steroids.  Case discussed with hospitalist for admission.  .Critical Care Performed by: Blake Divine, MD Authorized by: Blake Divine, MD   Critical care provider statement:    Critical care time (minutes):  45   Critical care time was exclusive of:  Separately billable  procedures and treating other patients and teaching time   Critical care was necessary to treat or prevent imminent or life-threatening deterioration of the following conditions:  Respiratory failure and circulatory failure   Critical care was time spent personally by me on the following activities:  Discussions with consultants, evaluation of patient's response to treatment, examination of patient, ordering and performing treatments and interventions, ordering and review of laboratory studies, ordering and review of radiographic studies, pulse oximetry, re-evaluation of patient's condition, obtaining history from patient or surrogate and review of old charts   I assumed direction of critical care for this patient from another provider in my specialty: yes     Care discussed with: admitting provider        Blake Divine, MD 03/26/20 5110    Blake Divine, MD 03/26/20 [phone]

## 2020-03-25 NOTE — ED Provider Notes (Addendum)
[facility] Emergency Department Provider Note   ____________________________________________   Event Date/Time   First MD Initiated Contact with Patient 03/26/2020 2139     (approximate)  I have reviewed the triage vital signs and the nursing notes.   HISTORY  Chief Complaint Shortness of Breath  History limited by patient only being oriented to himself not being able to give me a good history.  HPI [name] is a 79 y.o. male with his wife is hospitalized here with Covid. Patient has been getting more short of breath and having a wet cough. He has been in bed for several days at least. He is only oriented to himself. He cannot tell me how long he has been short of breath for. Patient does have a past history of COPD.         Past Medical History:  Diagnosis Date  . Chest pain   . COPD (chronic obstructive pulmonary disease) ([place])   . Depression   . Diabetes ([place])   . Dysphagia   . Essential tremor   . Heart disease   . Hyperlipidemia   . Hypertension   . Seizure disorder ([place])   . Tardive dyskinesia   . Vitamin D deficiency     Patient Active Problem List   Diagnosis Date Noted  . Atherosclerosis of lower extremity with claudication ([place]) 09/13/2017  . COPD exacerbation ([place]) 08/09/2017  . Hypokalemia 06/20/2016  . Syncope   . CAD (coronary artery disease) 06/19/2016  . Status post coronary artery stent placement   . SVT (supraventricular tachycardia) ([place])   . NSVT (nonsustained ventricular tachycardia) ([place])   . NSTEMI (non-ST elevated myocardial infarction) ([place])   . Parkinson's variant of multiple system atrophy ([place]) 12/19/2015  . Encephalopathy chronic 12/14/2015  . Chest pain   . COPD (chronic obstructive pulmonary disease) ([place])   . Heart disease   . Seizure disorder ([place])   . Depression   . Tardive dyskinesia   . Dysphagia   . Skin lesion 08/14/2015  . Shortness of breath 08/14/2015  . Essential  hypertension 08/14/2015  . Hyperlipidemia 08/14/2015  . Essential tremor 08/14/2015    Past Surgical History:  Procedure Laterality Date  . CARDIAC CATHETERIZATION N/A 01/19/2016   Procedure: Left Heart Cath and Coronary Angiography;  Surgeon: Jolaine Artist, MD;  Location: [place] [facility];  Service: Cardiovascular;  Laterality: N/A;  . CARDIAC CATHETERIZATION  01/19/2016   Procedure: Coronary Stent Intervention;  Surgeon: Jettie Booze, MD;  Location: [place] [facility];  Service: Cardiovascular;;  . INGUINAL HERNIA REPAIR Left as child    Prior to Admission medications   Medication Sig Start Date End Date Taking? Authorizing Provider  aspirin EC 81 MG EC tablet Take 1 tablet (81 mg total) by mouth daily. 12/16/15   Regalado, Belkys A, MD  losartan (COZAAR) 100 MG tablet Take 1 tablet (100 mg total) by mouth daily. 03/27/18   Dorothy Spark, MD  metoprolol succinate (TOPROL-XL) 100 MG 24 hr tablet Take 100 mg by mouth daily. Take with or immediately following a meal.    [provider]  nitroGLYCERIN (NITROSTAT) 0.4 MG SL tablet Place 1 tablet (0.4 mg total) under the tongue every 5 (five) minutes x 3 doses as needed for chest pain. 01/21/16   Cheryln Manly, NP  ondansetron (ZOFRAN ODT) 4 MG disintegrating tablet Take 1 tablet (4 mg total) by mouth every 8 (eight) hours as needed for nausea or vomiting.  05/22/18   Lavonia Drafts, MD  rosuvastatin (CRESTOR) 5 MG tablet Take 1 tablet (5 mg total) by mouth daily. 03/27/18   Dorothy Spark, MD    Allergies Levaquin [levofloxacin in d5w] and Penicillins  Family History  Problem Relation Age of Onset  . Lung cancer Father   . Emphysema Father   . Depression Mother   . CAD Brother   . Heart attack Brother        MI in his 38's    Social History Social History   Tobacco Use  . Smoking status: Former Smoker    Quit date: 09/04/2004    Years since quitting: 15.5  . Smokeless tobacco: Never Used  Vaping  Use  . Vaping Use: Never used  Substance Use Topics  . Alcohol use: No    Alcohol/week: 0.0 standard drinks  . Drug use: No    Review of Systems Unable to obtain  ____________________________________________   PHYSICAL EXAM:  VITAL SIGNS: ED Triage Vitals  Enc Vitals Group     BP --      Pulse --      Resp --      Temp --      Temp src --      SpO2 --      Weight 04/09/2020 2140 130 lb (59 kg)     Height 04/03/2020 2140 5\' 10"  (1.778 m)     Head Circumference --      Peak Flow --      Pain Score 04/03/2020 2136 0     Pain Loc --      Pain Edu? --      Excl. in [place]? --     Constitutional: Alert and oriented to himself only. Patient is breathing fast and moaning Eyes: Conjunctivae are normal.  Head: Atraumatic. Nose: No congestion/rhinnorhea. Mouth/Throat: Mucous membranes are moist.  Oropharynx non-erythematous. Neck: No stridor.   Cardiovascular: Rapid rate, regular rhythm. Grossly normal heart sounds.  Good peripheral circulation. Respiratory: Increased respiratory effort.  retractions. Lungs rhonchi throughout. Gastrointestinal: Soft and nontender. No distention. No abdominal bruits.  Musculoskeletal: No lower extremity tenderness nor edema.   Neurologic:  Normal speech and language though limited by shortness of breath. No gross focal neurologic deficits are appreciated. Skin:  Skin is warm, dry and intact. No rash noted.   ____________________________________________   LABS (all labs ordered are listed, but only abnormal results are displayed)  Labs Reviewed  RESP PANEL BY RT-PCR (FLU A&B, COVID) ARPGX2 - Abnormal; Notable for the following components:      Result Value   SARS Coronavirus 2 by RT PCR POSITIVE (*)    All other components within normal limits  LACTIC ACID, PLASMA - Abnormal; Notable for the following components:   Lactic Acid, Venous 7.4 (*)    All other components within normal limits  COMPREHENSIVE METABOLIC PANEL - Abnormal; Notable for the  following components:   Sodium 152 (*)    Glucose, Bld 129 (*)    BUN 45 (*)    Calcium 8.6 (*)    Albumin 3.2 (*)    AST 45 (*)    Total Bilirubin 2.5 (*)    Anion gap 20 (*)    All other components within normal limits  CBC WITH DIFFERENTIAL/PLATELET - Abnormal; Notable for the following components:   WBC 12.2 (*)    RBC 6.12 (*)    Hemoglobin 18.3 (*)    HCT 55.2 (*)    Neutro Abs  10.1 (*)    Monocytes Absolute 1.2 (*)    Abs Immature Granulocytes 0.13 (*)    All other components within normal limits  APTT - Abnormal; Notable for the following components:   aPTT <24 (*)    All other components within normal limits  FIBRIN DERIVATIVES D-DIMER ([facility] ONLY) - Abnormal; Notable for the following components:   Fibrin derivatives D-dimer ([facility]) 1,824.31 (*)    All other components within normal limits  CULTURE, BLOOD (SINGLE)  URINE CULTURE  PROTIME-INR  LACTIC ACID, PLASMA  URINALYSIS, COMPLETE (UACMP) WITH MICROSCOPIC   ____________________________________________  EKG EKG read interpreted by me shows a heart rate of approximately 116.  The computer is reading a flutter but I cannot see any evidence of a flutter.  Looks more like sinus rhythm to me.  Baseline is extremely irregular though and this makes it very hard to read.  Additionally the computer saying acute ischemia but I do not see evidence of that either with the understanding that and again the very irregular baseline makes it hard to interpret.  ____________________________________________  RADIOLOGY I, Nena Polio, personally viewed and evaluated these images (plain radiographs) as part of my medical decision making, as well as reviewing the written report by the radiologist.  ED MD interpretation: Chest x-ray to me looks similar to prior chest x-ray I am waiting on the radiologist results  Official radiology report(s): No results  found.  ____________________________________________   PROCEDURES  Procedure(s) performed (including Critical Care):  Procedures   ____________________________________________   INITIAL IMPRESSION / ASSESSMENT AND PLAN / ED COURSE  Patient with increasing shortness of breath and cough exposed to Covid.  Covid test is still pending.  Lungs do not sound bad.  Patient does have a white count that is elevated with a lot of polys lactic acid is elevated and he may be a little bit dehydrated his sodium is elevated as well.  I am waiting on further lab results.  I will sign this patient out to the oncoming physician.  I anticipate he will need admission.  I am giving him some hydration for his elevated lactic acid level and some antibiotics due to his elevated white count and differential.  This along with his hypoxia make me worried that he may actually have a bacterial pneumonia with or instead of Covid.  I am also getting a BNP and D-dimer in case of either 1 of these being the cause of his tachypnea and hypoxia.              ____________________________________________   FINAL CLINICAL IMPRESSION(S) / ED DIAGNOSES  Final diagnoses:  SOB (shortness of breath)  Hypoxia     ED Discharge Orders    None      *Please note:  [name] was evaluated in Emergency Department on 03/26/2020 for the symptoms described in the history of present illness. He was evaluated in the context of the global COVID-19 pandemic, which necessitated consideration that the patient might be at risk for infection with the SARS-CoV-2 virus that causes COVID-19. Institutional protocols and algorithms that pertain to the evaluation of patients at risk for COVID-19 are in a state of rapid change based on information released by regulatory bodies including the CDC and federal and state organizations. These policies and algorithms were followed during the patient's care in the ED.  Some ED evaluations  and interventions may be delayed as a result of limited staffing during and the pandemic.*  Note:  This document was prepared using Dragon voice recognition software and may include unintentional dictation errors.    Nena Polio, MD 03/26/20 [place] Ouch    Nena Polio, MD 03/26/20 [phone]

## 2020-03-25 NOTE — Progress Notes (Signed)
CODE SEPSIS - PHARMACY COMMUNICATION  **Broad Spectrum Antibiotics should be administered within 1 hour of Sepsis diagnosis**  Time Code Sepsis Called/Page Received: 2247  Antibiotics Ordered: Levofloxacin  Time of 1st antibiotic administration: [place], PharmD, [facility] 04/03/2020 11:25 PM

## 2020-03-25 NOTE — ED Triage Notes (Signed)
Pt to ED via EMS from home. Per ems pts son called for respiratory issues, states over past few days been getting more sob and has cough, pts wife admitted to hospital with covid. Per ems pt has been laying in bed for multiple days, arrives soiled unknown last time pt was up. Pt oriented to self only

## 2020-03-26 ENCOUNTER — Encounter: Payer: Self-pay | Admitting: Family Medicine

## 2020-03-26 ENCOUNTER — Inpatient Hospital Stay: Payer: PPO

## 2020-03-26 ENCOUNTER — Telehealth: Payer: Self-pay | Admitting: Nurse Practitioner

## 2020-03-26 DIAGNOSIS — I471 Supraventricular tachycardia: Secondary | ICD-10-CM | POA: Diagnosis not present

## 2020-03-26 DIAGNOSIS — A419 Sepsis, unspecified organism: Secondary | ICD-10-CM

## 2020-03-26 DIAGNOSIS — I472 Ventricular tachycardia: Secondary | ICD-10-CM | POA: Diagnosis present

## 2020-03-26 DIAGNOSIS — B9561 Methicillin susceptible Staphylococcus aureus infection as the cause of diseases classified elsewhere: Secondary | ICD-10-CM | POA: Diagnosis not present

## 2020-03-26 DIAGNOSIS — D72829 Elevated white blood cell count, unspecified: Secondary | ICD-10-CM | POA: Diagnosis not present

## 2020-03-26 DIAGNOSIS — G9341 Metabolic encephalopathy: Secondary | ICD-10-CM

## 2020-03-26 DIAGNOSIS — J440 Chronic obstructive pulmonary disease with acute lower respiratory infection: Secondary | ICD-10-CM | POA: Diagnosis not present

## 2020-03-26 DIAGNOSIS — Z66 Do not resuscitate: Secondary | ICD-10-CM | POA: Diagnosis not present

## 2020-03-26 DIAGNOSIS — I700 Atherosclerosis of aorta: Secondary | ICD-10-CM | POA: Diagnosis not present

## 2020-03-26 DIAGNOSIS — G40909 Epilepsy, unspecified, not intractable, without status epilepticus: Secondary | ICD-10-CM | POA: Diagnosis present

## 2020-03-26 DIAGNOSIS — J159 Unspecified bacterial pneumonia: Secondary | ICD-10-CM

## 2020-03-26 DIAGNOSIS — A4189 Other specified sepsis: Secondary | ICD-10-CM | POA: Diagnosis present

## 2020-03-26 DIAGNOSIS — J181 Lobar pneumonia, unspecified organism: Secondary | ICD-10-CM | POA: Diagnosis not present

## 2020-03-26 DIAGNOSIS — I10 Essential (primary) hypertension: Secondary | ICD-10-CM | POA: Diagnosis not present

## 2020-03-26 DIAGNOSIS — R9431 Abnormal electrocardiogram [ECG] [EKG]: Secondary | ICD-10-CM | POA: Diagnosis not present

## 2020-03-26 DIAGNOSIS — R6520 Severe sepsis without septic shock: Secondary | ICD-10-CM | POA: Diagnosis not present

## 2020-03-26 DIAGNOSIS — R0602 Shortness of breath: Secondary | ICD-10-CM | POA: Diagnosis not present

## 2020-03-26 DIAGNOSIS — J15211 Pneumonia due to Methicillin susceptible Staphylococcus aureus: Secondary | ICD-10-CM | POA: Diagnosis present

## 2020-03-26 DIAGNOSIS — G9340 Encephalopathy, unspecified: Secondary | ICD-10-CM | POA: Diagnosis not present

## 2020-03-26 DIAGNOSIS — J90 Pleural effusion, not elsewhere classified: Secondary | ICD-10-CM | POA: Diagnosis not present

## 2020-03-26 DIAGNOSIS — G9349 Other encephalopathy: Secondary | ICD-10-CM | POA: Diagnosis present

## 2020-03-26 DIAGNOSIS — R64 Cachexia: Secondary | ICD-10-CM | POA: Diagnosis present

## 2020-03-26 DIAGNOSIS — I5022 Chronic systolic (congestive) heart failure: Secondary | ICD-10-CM | POA: Diagnosis present

## 2020-03-26 DIAGNOSIS — R000 Tachycardia, unspecified: Secondary | ICD-10-CM | POA: Diagnosis not present

## 2020-03-26 DIAGNOSIS — J1282 Pneumonia due to coronavirus disease 2019: Secondary | ICD-10-CM | POA: Diagnosis present

## 2020-03-26 DIAGNOSIS — E870 Hyperosmolality and hypernatremia: Secondary | ICD-10-CM | POA: Diagnosis not present

## 2020-03-26 DIAGNOSIS — I4891 Unspecified atrial fibrillation: Secondary | ICD-10-CM

## 2020-03-26 DIAGNOSIS — J9600 Acute respiratory failure, unspecified whether with hypoxia or hypercapnia: Secondary | ICD-10-CM

## 2020-03-26 DIAGNOSIS — Z681 Body mass index (BMI) 19 or less, adult: Secondary | ICD-10-CM | POA: Diagnosis not present

## 2020-03-26 DIAGNOSIS — D649 Anemia, unspecified: Secondary | ICD-10-CM | POA: Diagnosis not present

## 2020-03-26 DIAGNOSIS — D62 Acute posthemorrhagic anemia: Secondary | ICD-10-CM | POA: Diagnosis not present

## 2020-03-26 DIAGNOSIS — R7881 Bacteremia: Secondary | ICD-10-CM | POA: Diagnosis not present

## 2020-03-26 DIAGNOSIS — Z7189 Other specified counseling: Secondary | ICD-10-CM | POA: Diagnosis not present

## 2020-03-26 DIAGNOSIS — E43 Unspecified severe protein-calorie malnutrition: Secondary | ICD-10-CM | POA: Diagnosis not present

## 2020-03-26 DIAGNOSIS — I4892 Unspecified atrial flutter: Secondary | ICD-10-CM | POA: Diagnosis present

## 2020-03-26 DIAGNOSIS — A4101 Sepsis due to Methicillin susceptible Staphylococcus aureus: Secondary | ICD-10-CM | POA: Diagnosis present

## 2020-03-26 DIAGNOSIS — E871 Hypo-osmolality and hyponatremia: Secondary | ICD-10-CM | POA: Diagnosis present

## 2020-03-26 DIAGNOSIS — R627 Adult failure to thrive: Secondary | ICD-10-CM | POA: Diagnosis not present

## 2020-03-26 DIAGNOSIS — Z515 Encounter for palliative care: Secondary | ICD-10-CM | POA: Diagnosis not present

## 2020-03-26 DIAGNOSIS — R6521 Severe sepsis with septic shock: Secondary | ICD-10-CM | POA: Diagnosis present

## 2020-03-26 DIAGNOSIS — G232 Striatonigral degeneration: Secondary | ICD-10-CM | POA: Diagnosis not present

## 2020-03-26 DIAGNOSIS — J1281 Pneumonia due to SARS-associated coronavirus: Secondary | ICD-10-CM | POA: Diagnosis not present

## 2020-03-26 DIAGNOSIS — U071 COVID-19: Secondary | ICD-10-CM | POA: Diagnosis not present

## 2020-03-26 DIAGNOSIS — K922 Gastrointestinal hemorrhage, unspecified: Secondary | ICD-10-CM | POA: Diagnosis not present

## 2020-03-26 DIAGNOSIS — J9621 Acute and chronic respiratory failure with hypoxia: Secondary | ICD-10-CM | POA: Diagnosis present

## 2020-03-26 DIAGNOSIS — J449 Chronic obstructive pulmonary disease, unspecified: Secondary | ICD-10-CM | POA: Diagnosis not present

## 2020-03-26 DIAGNOSIS — J189 Pneumonia, unspecified organism: Secondary | ICD-10-CM | POA: Diagnosis not present

## 2020-03-26 DIAGNOSIS — I2510 Atherosclerotic heart disease of native coronary artery without angina pectoris: Secondary | ICD-10-CM | POA: Diagnosis not present

## 2020-03-26 DIAGNOSIS — R0902 Hypoxemia: Secondary | ICD-10-CM | POA: Diagnosis not present

## 2020-03-26 DIAGNOSIS — R7989 Other specified abnormal findings of blood chemistry: Secondary | ICD-10-CM | POA: Diagnosis not present

## 2020-03-26 DIAGNOSIS — E872 Acidosis: Secondary | ICD-10-CM | POA: Diagnosis present

## 2020-03-26 LAB — COMPREHENSIVE METABOLIC PANEL
ALT: 10 U/L (ref 0–44)
AST: 28 U/L (ref 15–41)
Albumin: 2.5 g/dL — ABNORMAL LOW (ref 3.5–5.0)
Alkaline Phosphatase: 49 U/L (ref 38–126)
Anion gap: 10 (ref 5–15)
BUN: 34 mg/dL — ABNORMAL HIGH (ref 8–23)
CO2: 26 mmol/L (ref 22–32)
Calcium: 7.7 mg/dL — ABNORMAL LOW (ref 8.9–10.3)
Chloride: 112 mmol/L — ABNORMAL HIGH (ref 98–111)
Creatinine, Ser: 0.7 mg/dL (ref 0.61–1.24)
GFR, Estimated: >60 mL/min (ref >60)
Glucose, Bld: 119 mg/dL — ABNORMAL HIGH (ref 70–99)
Potassium: 4.3 mmol/L (ref 3.5–5.1)
Sodium: 148 mmol/L — ABNORMAL HIGH (ref 135–145)
Total Bilirubin: 1.4 mg/dL — ABNORMAL HIGH (ref 0.3–1.2)
Total Protein: 5.6 g/dL — ABNORMAL LOW (ref 6.5–8.1)

## 2020-03-26 LAB — GLUCOSE, CAPILLARY
Glucose-Capillary: 81 mg/dL (ref 70–99)
Glucose-Capillary: 67 mg/dL — ABNORMAL LOW (ref 70–99)
Glucose-Capillary: 131 mg/dL — ABNORMAL HIGH (ref 70–99)

## 2020-03-26 LAB — CBC
HCT: 43.7 % (ref 39.0–52.0)
Hemoglobin: 14.5 g/dL (ref 13.0–17.0)
MCH: 30 pg (ref 26.0–34.0)
MCHC: 33.2 g/dL (ref 30.0–36.0)
MCV: 90.3 fL (ref 80.0–100.0)
Platelets: 201 10*3/uL (ref 150–400)
RBC: 4.84 MIL/uL (ref 4.22–5.81)
RDW: 13.8 % (ref 11.5–15.5)
WBC: 8 10*3/uL (ref 4.0–10.5)
nRBC: 0 % (ref 0.0–0.2)

## 2020-03-26 LAB — PROCALCITONIN: Procalcitonin: <0.1 ng/mL

## 2020-03-26 LAB — CBG MONITORING, ED
Glucose-Capillary: 100 mg/dL — ABNORMAL HIGH (ref 70–99)
Glucose-Capillary: 111 mg/dL — ABNORMAL HIGH (ref 70–99)

## 2020-03-26 LAB — PROTIME-INR
INR: 1.2 (ref 0.8–1.2)
Prothrombin Time: 14.9 seconds (ref 11.4–15.2)

## 2020-03-26 LAB — CORTISOL-AM, BLOOD: Cortisol - AM: 19.1 ug/dL (ref 6.7–22.6)

## 2020-03-26 LAB — LACTIC ACID, PLASMA
Lactic Acid, Venous: 4 mmol/L (ref 0.5–1.9)
Lactic Acid, Venous: 2.2 mmol/L (ref 0.5–1.9)

## 2020-03-26 MED ORDER — LACTATED RINGERS IV BOLUS
1000.0000 mL | Freq: Once | INTRAVENOUS | Status: AC
  Administered 2020-03-26: 01:00:00 1000 mL via INTRAVENOUS

## 2020-03-26 MED ORDER — LACTATED RINGERS IV SOLN: INTRAVENOUS | Status: AC

## 2020-03-26 MED ORDER — DEXTROSE 50 % IV SOLN
12.5000 g | INTRAVENOUS | Status: AC
  Administered 2020-03-26: 20:00:00 12.5 g via INTRAVENOUS
  Filled 2020-03-26: qty 50

## 2020-03-26 MED ORDER — ONDANSETRON HCL 4 MG/2ML IJ SOLN: 4.0000 mg | Freq: Four times a day (QID) | INTRAMUSCULAR | Status: DC | PRN

## 2020-03-26 MED ORDER — HYDROCOD POLST-CPM POLST ER 10-8 MG/5ML PO SUER
5.0000 mL | Freq: Two times a day (BID) | ORAL | Status: DC | PRN
  Administered 2020-03-29 – 2020-04-01 (×4): 5 mL via ORAL
  Filled 2020-03-26 (×4): qty 5

## 2020-03-26 MED ORDER — INSULIN ASPART 100 UNIT/ML ~~LOC~~ SOLN: 0.0000 [IU] | Freq: Every day | SUBCUTANEOUS | Status: DC

## 2020-03-26 MED ORDER — ASCORBIC ACID 500 MG PO TABS
500.0000 mg | ORAL_TABLET | Freq: Every day | ORAL | Status: DC
  Administered 2020-03-26 – 2020-04-02 (×8): 500 mg via ORAL
  Filled 2020-03-26 (×8): qty 1

## 2020-03-26 MED ORDER — GUAIFENESIN-DM 100-10 MG/5ML PO SYRP: 10.0000 mL | ORAL_SOLUTION | ORAL | Status: DC | PRN

## 2020-03-26 MED ORDER — ALBUTEROL SULFATE HFA 108 (90 BASE) MCG/ACT IN AERS
2.0000 | INHALATION_SPRAY | Freq: Four times a day (QID) | RESPIRATORY_TRACT | Status: DC
  Administered 2020-03-26 (×3): 2 via RESPIRATORY_TRACT
  Filled 2020-03-26 (×2): qty 6.7

## 2020-03-26 MED ORDER — ZINC SULFATE 220 (50 ZN) MG PO CAPS
220.0000 mg | ORAL_CAPSULE | Freq: Every day | ORAL | Status: DC
  Administered 2020-03-26 – 2020-03-31 (×6): 220 mg via ORAL
  Filled 2020-03-26 (×6): qty 1

## 2020-03-26 MED ORDER — SODIUM CHLORIDE 0.9 % IV SOLN
500.0000 mg | INTRAVENOUS | Status: DC
  Administered 2020-03-26: 23:00:00 500 mg via INTRAVENOUS
  Filled 2020-03-26 (×2): qty 500

## 2020-03-26 MED ORDER — LABETALOL HCL 5 MG/ML IV SOLN: 10.0000 mg | INTRAVENOUS | Status: DC | PRN

## 2020-03-26 MED ORDER — ONDANSETRON HCL 4 MG PO TABS: 4.0000 mg | ORAL_TABLET | Freq: Four times a day (QID) | ORAL | Status: DC | PRN

## 2020-03-26 MED ORDER — SODIUM CHLORIDE 0.9 % IV SOLN
1.0000 g | INTRAVENOUS | Status: DC
  Administered 2020-03-26: 03:00:00 1 g via INTRAVENOUS
  Filled 2020-03-26: qty 10

## 2020-03-26 MED ORDER — LACTATED RINGERS IV SOLN: INTRAVENOUS | Status: DC

## 2020-03-26 MED ORDER — ACETAMINOPHEN 650 MG RE SUPP: 650.0000 mg | Freq: Four times a day (QID) | RECTAL | Status: DC | PRN

## 2020-03-26 MED ORDER — SODIUM CHLORIDE 0.9 % IV SOLN
1.0000 g | INTRAVENOUS | Status: DC
  Administered 2020-03-26: 22:00:00 1 g via INTRAVENOUS
  Filled 2020-03-26: qty 1
  Filled 2020-03-26: qty 10

## 2020-03-26 MED ORDER — DEXAMETHASONE SODIUM PHOSPHATE 10 MG/ML IJ SOLN
6.0000 mg | INTRAMUSCULAR | Status: DC
  Administered 2020-03-27 – 2020-03-31 (×5): 6 mg via INTRAVENOUS
  Filled 2020-03-26 (×5): qty 1

## 2020-03-26 MED ORDER — INSULIN ASPART 100 UNIT/ML ~~LOC~~ SOLN
0.0000 [IU] | Freq: Three times a day (TID) | SUBCUTANEOUS | Status: DC
  Administered 2020-03-28: 10:00:00 2 [IU] via SUBCUTANEOUS
  Administered 2020-03-29: 10:00:00 3 [IU] via SUBCUTANEOUS
  Administered 2020-03-29 – 2020-03-30 (×2): 2 [IU] via SUBCUTANEOUS
  Filled 2020-03-26 (×5): qty 1

## 2020-03-26 MED ORDER — SODIUM CHLORIDE 0.9 % IV SOLN
500.0000 mg | INTRAVENOUS | Status: DC
  Filled 2020-03-26: qty 500

## 2020-03-26 MED ORDER — METOPROLOL TARTRATE 5 MG/5ML IV SOLN: 5.0000 mg | INTRAVENOUS | Status: DC | PRN

## 2020-03-26 MED ORDER — IOHEXOL 350 MG/ML SOLN
100.0000 mL | Freq: Once | INTRAVENOUS | Status: AC | PRN
  Administered 2020-03-26: 15:00:00 75 mL via INTRAVENOUS

## 2020-03-26 MED ORDER — LINAGLIPTIN 5 MG PO TABS
5.0000 mg | ORAL_TABLET | Freq: Every day | ORAL | Status: DC
  Administered 2020-03-26 – 2020-04-01 (×6): 5 mg via ORAL
  Filled 2020-03-26 (×9): qty 1

## 2020-03-26 MED ORDER — SODIUM CHLORIDE 0.9 % IV SOLN
100.0000 mg | Freq: Every day | INTRAVENOUS | Status: AC
  Administered 2020-03-27 – 2020-03-30 (×4): 100 mg via INTRAVENOUS
  Filled 2020-03-26 (×4): qty 100

## 2020-03-26 MED ORDER — DEXAMETHASONE SODIUM PHOSPHATE 10 MG/ML IJ SOLN
10.0000 mg | Freq: Once | INTRAMUSCULAR | Status: AC
  Administered 2020-03-26: 01:00:00 10 mg via INTRAVENOUS
  Filled 2020-03-26: qty 1

## 2020-03-26 MED ORDER — ENOXAPARIN SODIUM 60 MG/0.6ML ~~LOC~~ SOLN
1.0000 mg/kg | Freq: Two times a day (BID) | SUBCUTANEOUS | Status: DC
  Administered 2020-03-26 (×2): 60 mg via SUBCUTANEOUS
  Filled 2020-03-26 (×3): qty 0.6

## 2020-03-26 MED ORDER — SODIUM CHLORIDE 0.9 % IV SOLN
200.0000 mg | Freq: Once | INTRAVENOUS | Status: AC
  Administered 2020-03-26: 03:00:00 200 mg via INTRAVENOUS
  Filled 2020-03-26: qty 200

## 2020-03-26 MED ORDER — METOPROLOL TARTRATE 5 MG/5ML IV SOLN
5.0000 mg | INTRAVENOUS | Status: DC | PRN
  Administered 2020-03-26: 01:00:00 5 mg via INTRAVENOUS
  Filled 2020-03-26: qty 5

## 2020-03-26 MED ORDER — ACETAMINOPHEN 325 MG PO TABS
650.0000 mg | ORAL_TABLET | Freq: Four times a day (QID) | ORAL | Status: DC | PRN
  Administered 2020-03-29 – 2020-03-30 (×3): 650 mg via ORAL
  Filled 2020-03-26 (×3): qty 2

## 2020-03-26 NOTE — Progress Notes (Signed)
On arrival to unit- patient O2 sats were at 77 %; patient breathing was labored.   Bumped O2 up to 6L; sat patient up, added a humidifier and changed pulse ox sensor.  O2 increased to 95-97%.  Turned back down O2 to 5L (COPD pt)    Charge nurse aware

## 2020-03-26 NOTE — Progress Notes (Signed)
[facility] consulted w/RN re: pt.'s condition in response to referral from [facility] earlier this AM; RN says pt. has been 'confused' today, and that medical team is still trying to determine source of this.  Pt. appears to be undistressed at present, but [place] remains available to support pt. if needed.

## 2020-03-26 NOTE — Telephone Encounter (Signed)
COVID-19 MAB Treatment Evaluation Qualifiers: Age >/= 35 Symptom Onset:Unknown Symptoms Present: [place] Test:does  have a confirmed positive test result  Chart review performed for the patient, [name] via telephone on behalf of the [place] to discuss how they are feeling since COVID-19 diagnosis, current symptoms, and to determine if the patient qualifies for MAB Infusion as a treatment option.   Patient is currently admitted to the hospital, therefore outpatient treatment is not an appropriate course at this time.    Worthy Keeler, DNP, AGNP-c COVID Monoclonal Antibody Treatment [facility]

## 2020-03-26 NOTE — ED Notes (Signed)
Breakfast tray given to pt, pt was fed a few bits of applesauce by this RN and then pt states he only wanted a few bites. Pt educated on importance of nutrition this RN will try again

## 2020-03-26 NOTE — ED Notes (Signed)
Pt BP 173/130 HR 105. Notified Charna Archer, MD. Pt given 5mg  Lopressor per Charna Archer, MD.

## 2020-03-26 NOTE — H&P (Signed)
History and Physical    [name] DXI:[id number] DOB: 1940-12-19 DOA: 03/13/2020  PCP: Idelle Crouch, MD   Patient coming from: Home  I have personally briefly reviewed patient's old medical records in [place]  Chief Complaint: Shortness of breath, altered mental status  HPI: [name] is a 79 y.o. male with medical history significant for HTN, COPD, seizures disorder, Parkinson's, CAD, with history of COVID-19 exposure from his wife who is currently hospitalized for Covid, who is brought in by EMS with a several day history of shortness of breath and cough, and who has been lying in bed, with poor oral intake, arriving unkempt and soiled and disoriented.  Patient is unable to contribute to history due to altered mental status.  On arrival he was afebrile at 97.2, BP 172/103, heart rate 95 respirations 40/min with O2 sat 84% on room air improving to 100% on 3 L.  While in the ER he went into rapid A. fib with heart rate as high as 159, with O2 sat dropping as low as 80% improving to 99% with 4 L.  Blood work returned with WBC of 12,000 and lactic acid 7.4, improving to 4 following 2 L fluid bolus.  Hemoglobin 18.3 suspect secondary to hemoconcentration.  CMP showed sodium of 152, BUN 45, creatinine 1.21.  D-dimer 1824.  Bilirubin 2.5.  Covid positive.  Chest x-ray showed right lower lobe infiltrate.  Patient treated with sepsis fluid bolus, IV antibiotics and also started on Decadron.  Hospitalist consulted for admission.     EKG as reviewed by me : #1: A flutter at 118 #2: Sinus tachycardia Imaging: Right lower lobe infiltrate  Review of Systems: Unable to obtain due to altered mental status  Past Medical History:  Diagnosis Date  . Chest pain   . COPD (chronic obstructive pulmonary disease) ([place])   . Depression   . Diabetes ([place])   . Dysphagia   . Essential tremor   . Heart disease   . Hyperlipidemia   . Hypertension   . Seizure disorder ([place])    . Tardive dyskinesia   . Vitamin D deficiency     Past Surgical History:  Procedure Laterality Date  . CARDIAC CATHETERIZATION N/A 01/19/2016   Procedure: Left Heart Cath and Coronary Angiography;  Surgeon: Jolaine Artist, MD;  Location: [facility];  Service: Cardiovascular;  Laterality: N/A;  . CARDIAC CATHETERIZATION  01/19/2016   Procedure: Coronary Stent Intervention;  Surgeon: Jettie Booze, MD;  Location: [facility];  Service: Cardiovascular;;  . INGUINAL HERNIA REPAIR Left as child     reports that he quit smoking about 15 years ago. He has never used smokeless tobacco. He reports that he does not drink alcohol and does not use drugs.  Allergies  Allergen Reactions  . Levaquin [Levofloxacin In D5w] Nausea And Vomiting  . Penicillins Hives    Childhood allergy. Has patient had a PCN reaction causing immediate rash, facial/tongue/throat swelling, SOB or lightheadedness with hypotension: Unknown Has patient had a PCN reaction causing severe rash involving mucus membranes or skin necrosis: Unknown Has patient had a PCN reaction that required hospitalization: Unknown Has patient had a PCN reaction occurring within the last 10 years: No If all of the above answers are "NO", then may proceed with Cephalosporin use.     Family History  Problem Relation Age of Onset  . Lung cancer Father   . Emphysema Father   . Depression Mother   .  CAD Brother   . Heart attack Brother        MI in his 20's      Prior to Admission medications   Medication Sig Start Date End Date Taking? Authorizing Provider  aspirin EC 81 MG EC tablet Take 1 tablet (81 mg total) by mouth daily. 12/16/15   Regalado, Belkys A, MD  losartan (COZAAR) 100 MG tablet Take 1 tablet (100 mg total) by mouth daily. 03/27/18   Dorothy Spark, MD  metoprolol succinate (TOPROL-XL) 100 MG 24 hr tablet Take 100 mg by mouth daily. Take with or immediately following a meal.    [provider]  nitroGLYCERIN (NITROSTAT) 0.4 MG SL tablet Place 1 tablet (0.4 mg total) under the tongue every 5 (five) minutes x 3 doses as needed for chest pain. 01/21/16   Cheryln Manly, NP  ondansetron (ZOFRAN ODT) 4 MG disintegrating tablet Take 1 tablet (4 mg total) by mouth every 8 (eight) hours as needed for nausea or vomiting. 05/22/18   Lavonia Drafts, MD  rosuvastatin (CRESTOR) 5 MG tablet Take 1 tablet (5 mg total) by mouth daily. 03/27/18   Dorothy Spark, MD    Physical Exam: Vitals:   03/19/2020 2154 03/27/2020 2200 03/26/20 0000 03/26/20 0113  BP:  (!) 178/97 (!) 143/95 (!) 155/94  Pulse:  92 (!) 108 93  Resp:  (!) 40 (!) 28 (!) 25  Temp:      TempSrc:      SpO2: 100%  99% 95%  Weight:      Height:         Vitals:   03/24/2020 2154 03/19/2020 2200 03/26/20 0000 03/26/20 0113  BP:  (!) 178/97 (!) 143/95 (!) 155/94  Pulse:  92 (!) 108 93  Resp:  (!) 40 (!) 28 (!) 25  Temp:      TempSrc:      SpO2: 100%  99% 95%  Weight:      Height:          Constitutional:  Ill-appearing, cachectic lethargic and oriented to name only.  Shaking tremor of hands  HEENT:      Head: Normocephalic and atraumatic.         Eyes: PERLA, EOMI, Conjunctivae are normal. Sclera is non-icteric.       Mouth/Throat: Mucous membranes are moist.       Neck: Supple with no signs of meningismus. Cardiovascular:  Tachycardic. No murmurs, gallops, or rubs. 2+ symmetrical distal pulses are present . No JVD. No LE edema Respiratory:  Tachypneic, respiratory effort increased.Lungs sounds diminished on right with coarse breath sounds Gastrointestinal: Soft, non tender, and non distended with positive bowel sounds.  Genitourinary: No CVA tenderness. Musculoskeletal: Nontender with normal range of motion in all extremities. No cyanosis, or erythema of extremities. Neurologic:  Face is symmetric. Moving all extremities. No gross focal neurologic deficits . Skin: Skin is warm, dry.  No rash or  ulcers Psychiatric: Appears anxious   Labs on Admission: I have personally reviewed following labs and imaging studies  CBC: Recent Labs  Lab 03/24/2020 2158  WBC 12.2*  NEUTROABS 10.1*  HGB 18.3*  HCT 55.2*  MCV 90.2  PLT 093   Basic Metabolic Panel: Recent Labs  Lab 03/16/2020 2158  NA 152*  K 3.9  CL 106  CO2 26  GLUCOSE 129*  BUN 45*  CREATININE 1.21  CALCIUM 8.6*   GFR: Estimated Creatinine Clearance: 42.7 mL/min (by C-G formula based on SCr of 1.21  mg/dL). Liver Function Tests: Recent Labs  Lab 03/30/2020 2158  AST 45*  ALT 16  ALKPHOS 61  BILITOT 2.5*  PROT 7.7  ALBUMIN 3.2*   No results for input(s): LIPASE, AMYLASE in the last 168 hours. No results for input(s): AMMONIA in the last 168 hours. Coagulation Profile: Recent Labs  Lab 03/24/2020 2158  INR 1.0   Cardiac Enzymes: No results for input(s): CKTOTAL, CKMB, CKMBINDEX, TROPONINI in the last 168 hours. BNP (last 3 results) No results for input(s): PROBNP in the last 8760 hours. HbA1C: No results for input(s): HGBA1C in the last 72 hours. CBG: No results for input(s): GLUCAP in the last 168 hours. Lipid Profile: No results for input(s): CHOL, HDL, LDLCALC, TRIG, CHOLHDL, LDLDIRECT in the last 72 hours. Thyroid Function Tests: No results for input(s): TSH, T4TOTAL, FREET4, T3FREE, THYROIDAB in the last 72 hours. Anemia Panel: No results for input(s): VITAMINB12, FOLATE, FERRITIN, TIBC, IRON, RETICCTPCT in the last 72 hours. Urine analysis:    Component Value Date/Time   COLORURINE YELLOW (A) 11/12/2017 1420   APPEARANCEUR CLEAR (A) 11/12/2017 1420   APPEARANCEUR Clear 05/13/2014 1251   LABSPEC 1.018 11/12/2017 1420   LABSPEC 1.013 05/13/2014 1251   PHURINE 5.0 11/12/2017 1420   GLUCOSEU NEGATIVE 11/12/2017 1420   GLUCOSEU Negative 05/13/2014 1251   HGBUR SMALL (A) 11/12/2017 1420   BILIRUBINUR NEGATIVE 11/12/2017 1420   BILIRUBINUR Negative 05/13/2014 1251   KETONESUR NEGATIVE  11/12/2017 1420   PROTEINUR NEGATIVE 11/12/2017 1420   NITRITE NEGATIVE 11/12/2017 1420   LEUKOCYTESUR NEGATIVE 11/12/2017 1420   LEUKOCYTESUR Negative 05/13/2014 1251    Radiological Exams on Admission: No results found.   Assessment/Plan 79 year old male with history of HTN, COPD, seizures disorder, Parkinson's, CAD, with history of COVID-19 exposure from his wife who is currently hospitalized for Covid, presenting with altered mental status, shortness of breath and cough.     Severe sepsis/septic shock ([place])   Pneumonia due to COVID-19 virus Superimposed bacterial pneumonia   Acute respiratory failure due to COVID-19 [facility]) -Patient presented with altered mental status, cough and shortness of breath, O2 sat 88% on room air, tachycardia, increased work of breathing requiring 4 L O2, leukocytosis, lactic acid 7,.  Afebrile and not hypotensive -Chest x-ray with patchy right basilar pneumonia Covid positive. -Suspect superimposed bacterial pneumonia -Follow procalcitonin -Covid treatment: Remdesivir, albuterol, Decadron, antitussives and vitamins -Bacterial pneumonia treatment: Levaquin(patient allergic to cephalosporins) -Oxygen to keep sats over 92% with proning as tolerated -Full dose Lovenox ordered pending PE rule out given elevated D-dimer of 1800 and respiratory failure (CTA chest when patient more stable) -IV fluid bolus and few hours of maintenance fluids, given greatest suspicion for secondary bacterial pneumonia/sepsis but with monitoring for worsening in view of Covid pneumonia -Close monitoring in stepdown    Acute hypernatremia -Likely secondary to dehydration -IV hydration and monitor    Rapid atrial fibrillation, new onset versus other narrow complex tachyarrhythmia -EKG showing A. fib versus other narrow complex tachycardia -Cardiology consult in the a.m. for assistance with rhythm identification -Heart rate improving with IV hydration so no rate control agents  ordered in view of severe sepsis diagnosis -Elevated CHADS2 score but will defer full dose anticoagulation to cardiologist  Elevated D-dimer -Likely related to Covid but acute PE not ruled out -Empiric full dose Lovenox pending stability to go through scanner for CTA chest    Acute metabolic encephalopathy -Secondary to acute infection and sepsis -Fall and aspiration precautions    Essential hypertension -As needed  labetalol IV for blood pressure    COPD (chronic obstructive pulmonary disease) ([facility]) -Albuterol inhaler scheduled and as needed as per treatment of Covid    Parkinson's variant of multiple system atrophy ([facility]) -Chronic and stable    CAD (coronary artery disease) -.  Stable -Continue aspirin, metoprolol, rosuvastatin and nitroglycerin    DVT prophylaxis: On full dose Lovenox Code Status: full code  Family Communication:  none  Disposition Plan: Back to previous home environment Consults called: none  Status:At the time of admission, it appears that the appropriate admission status for this patient is INPATIENT. This is judged to be reasonable and necessary in order to provide the required intensity of service to ensure the patient's safety given the presenting symptoms, physical exam findings, and initial radiographic and laboratory data in the context of their  Comorbid conditions.   Patient requires inpatient status due to high intensity of service, high risk for further deterioration and high frequency of surveillance required.   I certify that at the point of admission it is my clinical judgment that the patient will require inpatient hospital care spanning beyond [place] MD [facility]     03/26/2020, 1:55 AM

## 2020-03-26 NOTE — Progress Notes (Signed)
Pharmacy Antibiotic Note  [name] is a 79 y.o. male admitted on 03/28/2020 with pneumonia (CAP).  Pharmacy has been consulted to switch to  Ceftriaxone and Azithromycin in place of Levaquin.  Pt given Levaquin 750 mg (12/14 @ ~2300). Pt has Levaquin allergy causing N/V Pt has unknown PCN allergy. During prior hospital encounter in April/May 2019, pt ordered and given Ceftriaxone.  Plan: Order Ceftriaxone 1gm q24hr Order Azithromycin 500mg  IV q2hr starting ~24hr after Levaquin given  Height: 5\' 10"  (177.8 cm) Weight: 59 kg (130 lb) IBW/kg (Calculated) : 73  Temp (24hrs), Avg:97.2 F (36.2 C), Min:97.2 F (36.2 C), Max:97.2 F (36.2 C)  Recent Labs  Lab 03/15/2020 2158 03/26/20 0118  WBC 12.2*  --   CREATININE 1.21  --   LATICACIDVEN 7.4* 4.0*    Estimated Creatinine Clearance: 42.7 mL/min (by C-G formula based on SCr of 1.21 mg/dL).    Allergies  Allergen Reactions  . Levaquin [Levofloxacin In D5w] Nausea And Vomiting  . Penicillins Hives    Childhood allergy. Has patient had a PCN reaction causing immediate rash, facial/tongue/throat swelling, SOB or lightheadedness with hypotension: Unknown Has patient had a PCN reaction causing severe rash involving mucus membranes or skin necrosis: Unknown Has patient had a PCN reaction that required hospitalization: Unknown Has patient had a PCN reaction occurring within the last 10 years: No If all of the above answers are "NO", then may proceed with Cephalosporin use.     Thank you for allowing pharmacy to be a part of this patient's care.  Renda Rolls, PharmD, MBA 03/26/2020 2:26 AM

## 2020-03-26 NOTE — Progress Notes (Addendum)
PROGRESS NOTE    [name]  WNI:[id number] DOB: October 29, 1940 DOA: 03/26/2020 PCP: Idelle Crouch, MD   Chief Complaint  Patient presents with  . Shortness of Breath    Brief Narrative:  [name] is [name] 79 y.o. male with medical history significant for HTN, COPD, seizures disorder, Parkinson's, CAD, with history of COVID-19 exposure from his wife who is currently hospitalized for Covid, who is brought in by EMS with [name] several day history of shortness of breath and cough, and who has been lying in bed, with poor oral intake, arriving unkempt and soiled and disoriented.  Patient is unable to contribute to history due to altered mental status.  On arrival he was afebrile at 97.2, BP 172/103, heart rate 95 respirations 40/min with O2 sat 84% on room air improving to 100% on 3 L.  While in the ER he went into rapid [name]. fib with heart rate as high as 159, with O2 sat dropping as low as 80% improving to 99% with 4 L.  Blood work returned with WBC of 12,000 and lactic acid 7.4, improving to 4 following 2 L fluid bolus.  Hemoglobin 18.3 suspect secondary to hemoconcentration.  CMP showed sodium of 152, BUN 45, creatinine 1.21.  D-dimer 1824.  Bilirubin 2.5.  Covid positive.  Chest x-ray showed right lower lobe infiltrate.  Patient treated with sepsis fluid bolus, IV antibiotics and also started on Decadron.  Hospitalist consulted for admission.     Assessment & Plan:   Active Problems:   Essential hypertension   COPD (chronic obstructive pulmonary disease) (HCC)   Seizure disorder (HCC)   Parkinson's variant of multiple system atrophy ([place])   CAD (coronary artery disease)   Severe sepsis (HCC)   Pneumonia due to COVID-19 virus   Acute respiratory failure due to COVID-19 [facility])   Acute hypernatremia   Rapid atrial fibrillation, new onset ([place])   Acute metabolic encephalopathy   Bacterial pneumonia   Sepsis ([place])  Goals of care: son asking about transitioning pt to DNR -  requested he speak with pts wife (his stepmother) in order to make sure everyone is on same page.  He'll call back if they're in agreement.  Will also c/s palliative care, he's interested in [facility] most form.  Severe sepsis/septic shock secondary to covid 19 viral pneumonia  Acute hypoxic respiratory failure  - Patient presented with altered mental status, cough and shortness of breath, O2 sat 88% on room air, tachycardia, increased work of breathing requiring 4 L O2, leukocytosis, lactic acid 7.  Afebrile and not hypotensive - CXR with patchy right basilar infiltrate, procal <0.1 -> low suspicion for superimposed bacterial pneumonia -> will d/c abx - addendum - given CT findings concerning for possible aspiration and lobar pneumonia, will restart abx -continue steroids and remdesivir -- maintain O2>88% -elevated d dimer, on full dose lovenox - follow CT PE protocol and LE [place] as well as echo -- lactate downtrending -- I/O, daily weights -- Prone as able, OOB, flutter, IS, therapy  COVID-19 Labs  No results for input(s): DDIMER, FERRITIN, LDH, CRP in the last 72 hours.  Lab Results  Component Value Date   SARSCOV2NAA POSITIVE ([name]) 04/11/2020   Elevated D dimer: follow LE [place] and CT PE protocol  Acute hypernatremia - improving with IVF, continue to monitor  Supraventricular Tachycardia -EKG with SVT -> initial EKG's read flutter, but poor quality overall -- EKG today with sinus tachy  -Cardiology consult in the [name].m. for  assistance with rhythm identification, appreciate asistance -- follow echo   Acute metabolic encephalopathy -Secondary to acute infection and sepsis - delirium precautions -- follow TSH, vitamin b12, folate, ammonia    Essential hypertension -As needed labetalol for blood pressure > 180 -- home BP meds pending med rec    COPD (chronic obstructive pulmonary disease) (HCC) -Albuterol inhaler scheduled and as needed as per treatment of Covid    Parkinson's variant  of multiple system atrophy (HCC) -Chronic and stable    CAD (coronary artery disease) -.  Stable -Continue aspirin, metoprolol, rosuvastatin and nitroglycerin pending med rec  Med rec currently pending, follow with pharmacy  DVT prophylaxis: lovenox Code Status: full  Family Communication: none at bedside Disposition:   Status is: Inpatient  Remains inpatient appropriate because:Inpatient level of care appropriate due to severity of illness   Dispo: The patient is from: Home              Anticipated d/c is to: pending              Anticipated d/c date is: > 3 days              Patient currently is not medically stable to d/c.       Consultants:   none  Procedures:  none  Antimicrobials: Anti-infectives (From admission, onward)   Start     Dose/Rate Route Frequency Ordered Stop   03/27/20 1000  remdesivir 100 mg in sodium chloride 0.9 % 100 mL IVPB       "Followed by" Linked Group Details   100 mg 200 mL/hr over 30 Minutes Intravenous Daily 03/26/20 0146 03/31/20 0959   03/26/20 2300  azithromycin (ZITHROMAX) 500 mg in sodium chloride 0.9 % 250 mL IVPB  Status:  Discontinued        500 mg 250 mL/hr over 60 Minutes Intravenous Every 24 hours 03/26/20 0218 03/26/20 1448   03/26/20 0300  cefTRIAXone (ROCEPHIN) 1 g in sodium chloride 0.9 % 100 mL IVPB  Status:  Discontinued        1 g 200 mL/hr over 30 Minutes Intravenous Every 24 hours 03/26/20 0218 03/26/20 1448   03/26/20 0145  remdesivir 200 mg in sodium chloride 0.9% 250 mL IVPB       "Followed by" Linked Group Details   200 mg 580 mL/hr over 30 Minutes Intravenous Once 03/26/20 0146 03/26/20 0556   04/03/2020 2245  levofloxacin (LEVAQUIN) IVPB 750 mg        750 mg 100 mL/hr over 90 Minutes Intravenous  Once 03/28/2020 2244 03/26/20 0039         Subjective: [name]&Ox1 Feels ok, thinks he's at home Denies CP  Objective: Vitals:   03/26/20 1100 03/26/20 1130 03/26/20 1200 03/26/20 1335  BP: (!) 159/85 (!)  149/84 (!) 152/83   Pulse: (!) 148 97 (!) 31   Resp: (!) 25 (!) 22 (!) 23   Temp:  98.5 F (36.9 C)  (!) 96.8 F (36 C)  TempSrc:  Oral  Axillary  SpO2: 95% 98% (!) 74%   Weight:    60.4 kg  Height:    5\' 10"  (1.778 m)    Intake/Output Summary (Last 24 hours) at 03/26/2020 1442 Last data filed at 03/26/2020 0557 Gross per 24 hour  Intake 2350 ml  Output --  Net 2350 ml   Filed Weights   04/06/2020 2140 03/26/20 1335  Weight: 59 kg 60.4 kg    Examination:  General exam: Appears calm  and comfortable  Respiratory system: Clear to auscultation. Respiratory effort normal. Cardiovascular system: S1 & S2 heard, RRR. Gastrointestinal system: Abdomen is nondistended, soft and nontender. Central nervous system: Alert and disoriented.  [name]&Ox1. No focal neurological deficits. Extremities: no LEE Skin: No rashes, lesions or ulcers  Data Reviewed: I have personally reviewed following labs and imaging studies  CBC: Recent Labs  Lab 04/05/2020 2158 03/26/20 0507  WBC 12.2* 8.0  NEUTROABS 10.1*  --   HGB 18.3* 14.5  HCT 55.2* 43.7  MCV 90.2 90.3  PLT 302 448    Basic Metabolic Panel: Recent Labs  Lab 03/12/2020 2158 03/26/20 1030  NA 152* 148*  K 3.9 4.3  CL 106 112*  CO2 26 26  GLUCOSE 129* 119*  BUN 45* 34*  CREATININE 1.21 0.70  CALCIUM 8.6* 7.7*    GFR: Estimated Creatinine Clearance: 66.1 mL/min (by C-G formula based on SCr of 0.7 mg/dL).  Liver Function Tests: Recent Labs  Lab 03/22/2020 2158 03/26/20 1030  AST 45* 28  ALT 16 10  ALKPHOS 61 49  BILITOT 2.5* 1.4*  PROT 7.7 5.6*  ALBUMIN 3.2* 2.5*    CBG: Recent Labs  Lab 03/26/20 0252 03/26/20 0821  GLUCAP 100* 111*     Recent Results (from the past 240 hour(s))  Blood culture (routine single)     Status: None (Preliminary result)   Collection Time: 03/21/2020  9:58 PM   Specimen: BLOOD  Result Value Ref Range Status   Specimen Description BLOOD BLOOD RIGHT FOREARM  Final   Special Requests    Final    BOTTLES DRAWN AEROBIC AND ANAEROBIC Blood Culture adequate volume   Culture   Final    NO GROWTH < 12 HOURS Performed at [facility], [address] [place], [place] [address]    Report Status PENDING  Incomplete  Resp Panel by RT-PCR (Flu [name]&B, Covid) Nasopharyngeal Swab     Status: Abnormal   Collection Time: 04/08/2020  9:58 PM   Specimen: Nasopharyngeal Swab; Nasopharyngeal(NP) swabs in vial transport medium  Result Value Ref Range Status   SARS Coronavirus 2 by RT PCR POSITIVE ([name]) NEGATIVE Final    Comment: RESULT CALLED TO, READ BACK BY AND VERIFIED WITH: JEN AGNEW RN 2324 04/02/2020 (NOTE) SARS-CoV-2 target nucleic acids are DETECTED.  The SARS-CoV-2 RNA is generally detectable in upper respiratory specimens during the acute phase of infection. Positive results are indicative of the presence of the identified virus, but do not rule out bacterial infection or co-infection with other pathogens not detected by the test. Clinical correlation with patient history and other diagnostic information is necessary to determine patient infection status. The expected result is Negative.  Fact Sheet for Patients: EntrepreneurPulse.com.au  Fact Sheet for Healthcare Providers: IncredibleEmployment.be  This test is not yet approved or cleared by the [place] FDA and  has been authorized for detection and/or diagnosis of SARS-CoV-2 by FDA under an Emergency Use Authorization (EUA).  This EUA will remain in effect (meaning this test can be used)  for the duration of  the COVID-19 declaration under Section 564(b)(1) of the Act, 21 U.S.C. section 360bbb-3(b)(1), unless the authorization is terminated or revoked sooner.     Influenza [place] by PCR NEGATIVE NEGATIVE Final   Influenza B by PCR NEGATIVE NEGATIVE Final    Comment: (NOTE) The Xpert Xpress SARS-CoV-2/FLU/RSV plus assay is intended as an aid in the diagnosis of influenza from  Nasopharyngeal swab specimens and should not be used as Lalisa Kiehn  sole basis for treatment. Nasal washings and aspirates are unacceptable for Xpert Xpress SARS-CoV-2/FLU/RSV testing.  Fact Sheet for Patients: EntrepreneurPulse.com.au  Fact Sheet for Healthcare Providers: IncredibleEmployment.be  This test is not yet approved or cleared by the [place] FDA and has been authorized for detection and/or diagnosis of SARS-CoV-2 by FDA under an Emergency Use Authorization (EUA). This EUA will remain in effect (meaning this test can be used) for the duration of the COVID-19 declaration under Section 564(b)(1) of the Act, 21 U.S.C. section 360bbb-3(b)(1), unless the authorization is terminated or revoked.  Performed at [facility], [address], [place], [place] [address]          Radiology Studies: DG Chest [place] 1 View  Result Date: 04/01/2020 CLINICAL DATA:  Sepsis EXAM: PORTABLE CHEST 1 VIEW COMPARISON:  05/22/2018 FINDINGS: Patchy right basilar pulmonary infiltrate is present, possibly infectious in the acute setting. The lungs are otherwise clear. No pneumothorax or pleural effusion. Cardiac size within normal limits. Pulmonary vascularity is normal. No acute bone abnormality. IMPRESSION: Patchy right basilar pulmonary infiltrate, possibly infectious in the acute setting. Electronically Signed   By: Fidela Salisbury MD   On: 03/20/2020 22:16        Scheduled Meds: . albuterol  2 puff Inhalation Q6H  . vitamin C  500 mg Oral Daily  . dexamethasone (DECADRON) injection  6 mg Intravenous Q24H  . enoxaparin (LOVENOX) injection  1 mg/kg Subcutaneous Q12H  . insulin aspart  0-5 Units Subcutaneous QHS  . insulin aspart  0-9 Units Subcutaneous TID WC  . linagliptin  5 mg Oral Daily  . zinc sulfate  220 mg Oral Daily   Continuous Infusions: . azithromycin    . cefTRIAXone (ROCEPHIN)  IV Stopped (03/26/20 0557)  . [START ON 03/27/2020]  remdesivir 100 mg in NS 100 mL       LOS: 0 days    Time spent: over 30 min    Fayrene Helper, MD [facility]   To contact the attending provider between 7A-7P or the covering provider during after hours 7P-7A, please log into the web site [email or web address] and access using universal [place] password for that web site. If you do not have the password, please call the hospital operator.  03/26/2020, 2:42 PM

## 2020-03-26 NOTE — ED Notes (Signed)
Pt resting comfortably at this time. NAD noted. Call bell in reach. Pt denies any further needs at this time.

## 2020-03-26 NOTE — ED Notes (Signed)
Spoke to pt son, [name]. Pt son # is [phone].

## 2020-03-26 NOTE — Progress Notes (Signed)
Spoke to son-  Concern for home environment. Son reports hoarding type of environment and concerned patient is not getting his medications appropriately.   Patient wife is currently in the hospital with Covid.   Son also reported he was the medical/healthcare POA.  Had papers when patient was at [facility], but does not have them now.   Told son because wife is the next of kin- she would be the primary decision maker unless he can locate documentations.   Son wants to make patient DNR.    Will get the MD to call and get social worker involved.

## 2020-03-26 NOTE — Progress Notes (Signed)
Remdesivir - Pharmacy Brief Note   O:  CXR: "Patchy right basilar pulmonary infiltrate, possibly infectious in the acute setting." SpO2: 84-100% on 3L/min [place]   A/P:  Remdesivir 200 mg IVPB once followed by 100 mg IVPB daily x 4 days.   Renda Rolls, PharmD, [facility] 03/26/2020 2:04 AM

## 2020-03-26 NOTE — Progress Notes (Signed)
Sepsis protocol is being followed by eLink. 

## 2020-03-26 NOTE — ED Notes (Signed)
Secure chat to IP started

## 2020-03-26 NOTE — ED Notes (Signed)
Pt given daily PO meds via applesauce. Pt was having difficulty understanding to swallow the water with the pills. Pt did well with PO administration with applesauce.

## 2020-03-27 ENCOUNTER — Inpatient Hospital Stay (HOSPITAL_COMMUNITY)
Admit: 2020-03-27 | Discharge: 2020-03-27 | Disposition: A | Discharge status: Home / Self Care | Payer: PPO | Attending: Family Medicine | Admitting: Family Medicine

## 2020-03-27 DIAGNOSIS — R0902 Hypoxemia: Secondary | ICD-10-CM

## 2020-03-27 DIAGNOSIS — R0602 Shortness of breath: Secondary | ICD-10-CM

## 2020-03-27 DIAGNOSIS — J9600 Acute respiratory failure, unspecified whether with hypoxia or hypercapnia: Secondary | ICD-10-CM

## 2020-03-27 DIAGNOSIS — G9341 Metabolic encephalopathy: Secondary | ICD-10-CM

## 2020-03-27 DIAGNOSIS — Z66 Do not resuscitate: Secondary | ICD-10-CM | POA: Diagnosis not present

## 2020-03-27 DIAGNOSIS — R6520 Severe sepsis without septic shock: Secondary | ICD-10-CM

## 2020-03-27 DIAGNOSIS — R7881 Bacteremia: Secondary | ICD-10-CM

## 2020-03-27 DIAGNOSIS — J1282 Pneumonia due to coronavirus disease 2019: Secondary | ICD-10-CM

## 2020-03-27 DIAGNOSIS — Z515 Encounter for palliative care: Secondary | ICD-10-CM

## 2020-03-27 DIAGNOSIS — J449 Chronic obstructive pulmonary disease, unspecified: Secondary | ICD-10-CM

## 2020-03-27 DIAGNOSIS — G232 Striatonigral degeneration: Secondary | ICD-10-CM

## 2020-03-27 DIAGNOSIS — R9431 Abnormal electrocardiogram [ECG] [EKG]: Secondary | ICD-10-CM

## 2020-03-27 DIAGNOSIS — I10 Essential (primary) hypertension: Secondary | ICD-10-CM

## 2020-03-27 DIAGNOSIS — B9561 Methicillin susceptible Staphylococcus aureus infection as the cause of diseases classified elsewhere: Secondary | ICD-10-CM

## 2020-03-27 DIAGNOSIS — A419 Sepsis, unspecified organism: Secondary | ICD-10-CM

## 2020-03-27 DIAGNOSIS — Z7189 Other specified counseling: Secondary | ICD-10-CM | POA: Diagnosis not present

## 2020-03-27 DIAGNOSIS — J159 Unspecified bacterial pneumonia: Secondary | ICD-10-CM

## 2020-03-27 DIAGNOSIS — U071 COVID-19: Secondary | ICD-10-CM

## 2020-03-27 LAB — FOLATE: Folate: 4.5 ng/mL — ABNORMAL LOW (ref >5.9)

## 2020-03-27 LAB — TSH: TSH: 0.206 u[IU]/mL — ABNORMAL LOW (ref 0.350–4.500)

## 2020-03-27 LAB — BLOOD CULTURE ID PANEL (REFLEXED) - BCID2

## 2020-03-27 LAB — T4, FREE: Free T4: 1.26 ng/dL — ABNORMAL HIGH (ref 0.61–1.12)

## 2020-03-27 LAB — C-REACTIVE PROTEIN: CRP: 1.6 mg/dL — ABNORMAL HIGH (ref <1.0)

## 2020-03-27 LAB — GLUCOSE, CAPILLARY
Glucose-Capillary: 142 mg/dL — ABNORMAL HIGH (ref 70–99)
Glucose-Capillary: 105 mg/dL — ABNORMAL HIGH (ref 70–99)
Glucose-Capillary: 110 mg/dL — ABNORMAL HIGH (ref 70–99)
Glucose-Capillary: 107 mg/dL — ABNORMAL HIGH (ref 70–99)

## 2020-03-27 LAB — URINALYSIS, COMPLETE (UACMP) WITH MICROSCOPIC
Bacteria, UA: NONE SEEN
Bilirubin Urine: NEGATIVE
Glucose, UA: NEGATIVE mg/dL
Hgb urine dipstick: NEGATIVE
Ketones, ur: NEGATIVE mg/dL
Leukocytes,Ua: NEGATIVE
Nitrite: NEGATIVE
Protein, ur: NEGATIVE mg/dL
Specific Gravity, Urine: 1.014 (ref 1.005–1.030)
Squamous Epithelial / HPF: NONE SEEN (ref 0–5)
pH: 5 (ref 5.0–8.0)

## 2020-03-27 LAB — COMPREHENSIVE METABOLIC PANEL
ALT: 11 U/L (ref 0–44)
AST: 26 U/L (ref 15–41)
Albumin: 2.3 g/dL — ABNORMAL LOW (ref 3.5–5.0)
Alkaline Phosphatase: 47 U/L (ref 38–126)
Anion gap: 8 (ref 5–15)
BUN: 28 mg/dL — ABNORMAL HIGH (ref 8–23)
CO2: 27 mmol/L (ref 22–32)
Calcium: 7.7 mg/dL — ABNORMAL LOW (ref 8.9–10.3)
Chloride: 113 mmol/L — ABNORMAL HIGH (ref 98–111)
Creatinine, Ser: 0.68 mg/dL (ref 0.61–1.24)
GFR, Estimated: >60 mL/min (ref >60)
Glucose, Bld: 88 mg/dL (ref 70–99)
Potassium: 3.6 mmol/L (ref 3.5–5.1)
Sodium: 148 mmol/L — ABNORMAL HIGH (ref 135–145)
Total Bilirubin: 1 mg/dL (ref 0.3–1.2)
Total Protein: 5.3 g/dL — ABNORMAL LOW (ref 6.5–8.1)

## 2020-03-27 LAB — ECHOCARDIOGRAM COMPLETE
Height: 72 in
S' Lateral: 3.65 cm
Weight: 2204.8 oz

## 2020-03-27 LAB — CBC WITH DIFFERENTIAL/PLATELET
Abs Immature Granulocytes: 0.1 10*3/uL — ABNORMAL HIGH (ref 0.00–0.07)
Basophils Absolute: 0 10*3/uL (ref 0.0–0.1)
Basophils Relative: 0 %
Eosinophils Absolute: 0 10*3/uL (ref 0.0–0.5)
Eosinophils Relative: 0 %
HCT: 40.9 % (ref 39.0–52.0)
Hemoglobin: 13.8 g/dL (ref 13.0–17.0)
Immature Granulocytes: 1 %
Lymphocytes Relative: 6 %
Lymphs Abs: 0.7 10*3/uL (ref 0.7–4.0)
MCH: 30.9 pg (ref 26.0–34.0)
MCHC: 33.7 g/dL (ref 30.0–36.0)
MCV: 91.5 fL (ref 80.0–100.0)
Monocytes Absolute: 0.9 10*3/uL (ref 0.1–1.0)
Monocytes Relative: 8 %
Neutro Abs: 9.3 10*3/uL — ABNORMAL HIGH (ref 1.7–7.7)
Neutrophils Relative %: 85 %
Platelets: 182 10*3/uL (ref 150–400)
RBC: 4.47 MIL/uL (ref 4.22–5.81)
RDW: 13.7 % (ref 11.5–15.5)
WBC: 11 10*3/uL — ABNORMAL HIGH (ref 4.0–10.5)
nRBC: 0 % (ref 0.0–0.2)

## 2020-03-27 LAB — LACTIC ACID, PLASMA
Lactic Acid, Venous: 5.5 mmol/L (ref 0.5–1.9)
Lactic Acid, Venous: 4.4 mmol/L (ref 0.5–1.9)
Lactic Acid, Venous: 3.5 mmol/L (ref 0.5–1.9)

## 2020-03-27 LAB — FIBRIN DERIVATIVES D-DIMER (ARMC ONLY): Fibrin derivatives D-dimer (ARMC): 976.89 ng/mL (FEU) — ABNORMAL HIGH (ref 0.00–499.00)

## 2020-03-27 LAB — MRSA PCR SCREENING: MRSA by PCR: POSITIVE — AB

## 2020-03-27 LAB — AMMONIA: Ammonia: 16 umol/L (ref 9–35)

## 2020-03-27 LAB — BRAIN NATRIURETIC PEPTIDE: B Natriuretic Peptide: 566.2 pg/mL — ABNORMAL HIGH (ref 0.0–100.0)

## 2020-03-27 LAB — VITAMIN B12: Vitamin B-12: 179 pg/mL — ABNORMAL LOW (ref 180–914)

## 2020-03-27 LAB — MAGNESIUM: Magnesium: 2 mg/dL (ref 1.7–2.4)

## 2020-03-27 MED ORDER — ENSURE ENLIVE PO LIQD
237.0000 mL | Freq: Three times a day (TID) | ORAL | Status: DC
  Administered 2020-03-27: 19:00:00 237 mL via ORAL

## 2020-03-27 MED ORDER — CEFAZOLIN SODIUM-DEXTROSE 2-4 GM/100ML-% IV SOLN
2.0000 g | Freq: Three times a day (TID) | INTRAVENOUS | Status: DC
  Administered 2020-03-27 – 2020-04-01 (×14): 2 g via INTRAVENOUS
  Filled 2020-03-27 (×18): qty 100

## 2020-03-27 MED ORDER — CYANOCOBALAMIN 1000 MCG/ML IJ SOLN
1000.0000 ug | Freq: Every day | INTRAMUSCULAR | Status: AC
  Administered 2020-03-27 – 2020-04-02 (×6): 1000 ug via INTRAMUSCULAR
  Filled 2020-03-27 (×8): qty 1

## 2020-03-27 MED ORDER — METOPROLOL TARTRATE 5 MG/5ML IV SOLN
5.0000 mg | INTRAVENOUS | Status: DC | PRN
  Administered 2020-03-29: 01:00:00 5 mg via INTRAVENOUS
  Filled 2020-03-27 (×3): qty 5

## 2020-03-27 MED ORDER — VITAMIN B-12 1000 MCG PO TABS: 1000.0000 ug | ORAL_TABLET | Freq: Every day | ORAL | Status: DC

## 2020-03-27 MED ORDER — ENOXAPARIN SODIUM 40 MG/0.4ML ~~LOC~~ SOLN
40.0000 mg | SUBCUTANEOUS | Status: DC
  Administered 2020-03-27 – 2020-03-31 (×5): 40 mg via SUBCUTANEOUS
  Filled 2020-03-27 (×5): qty 0.4

## 2020-03-27 MED ORDER — DEXTROSE 5 % IV SOLN: INTRAVENOUS | Status: DC

## 2020-03-27 MED ORDER — SODIUM CHLORIDE 0.9 % IV SOLN
1.0000 mg | Freq: Every day | INTRAVENOUS | Status: AC
  Administered 2020-03-27 – 2020-03-30 (×4): 1 mg via INTRAVENOUS
  Filled 2020-03-27 (×5): qty 0.2

## 2020-03-27 MED ORDER — METOPROLOL TARTRATE 25 MG PO TABS
25.0000 mg | ORAL_TABLET | Freq: Two times a day (BID) | ORAL | Status: DC
  Administered 2020-03-27 – 2020-03-30 (×6): 25 mg via ORAL
  Filled 2020-03-27 (×6): qty 1

## 2020-03-27 MED ORDER — METOPROLOL TARTRATE 5 MG/5ML IV SOLN
5.0000 mg | INTRAVENOUS | Status: DC | PRN
  Administered 2020-03-27: 07:00:00 5 mg via INTRAVENOUS
  Filled 2020-03-27: qty 5

## 2020-03-27 MED ORDER — BARICITINIB 2 MG PO TABS
4.0000 mg | ORAL_TABLET | Freq: Every day | ORAL | Status: DC
  Filled 2020-03-27: qty 2

## 2020-03-27 MED ORDER — METOPROLOL TARTRATE 25 MG PO TABS: 25.0000 mg | ORAL_TABLET | Freq: Two times a day (BID) | ORAL | Status: DC

## 2020-03-27 MED ORDER — LACTATED RINGERS IV BOLUS: 500.0000 mL | Freq: Once | INTRAVENOUS | Status: DC

## 2020-03-27 MED ORDER — POTASSIUM CHLORIDE 10 MEQ/100ML IV SOLN
10.0000 meq | INTRAVENOUS | Status: AC
  Administered 2020-03-27 (×2): 10 meq via INTRAVENOUS
  Filled 2020-03-27 (×2): qty 100

## 2020-03-27 MED ORDER — ORAL CARE MOUTH RINSE
15.0000 mL | Freq: Two times a day (BID) | OROMUCOSAL | Status: DC
  Administered 2020-03-27 – 2020-04-03 (×13): 15 mL via OROMUCOSAL

## 2020-03-27 MED ORDER — DEXTROSE-NACL 5-0.45 % IV SOLN: INTRAVENOUS | Status: AC

## 2020-03-27 MED ORDER — FUROSEMIDE 10 MG/ML IJ SOLN
40.0000 mg | Freq: Once | INTRAMUSCULAR | Status: AC
  Administered 2020-03-27: 04:00:00 40 mg via INTRAVENOUS
  Filled 2020-03-27: qty 4

## 2020-03-27 MED ORDER — CHLORHEXIDINE GLUCONATE 0.12 % MT SOLN
15.0000 mL | Freq: Two times a day (BID) | OROMUCOSAL | Status: DC
  Administered 2020-03-27 – 2020-04-03 (×14): 15 mL via OROMUCOSAL
  Filled 2020-03-27 (×14): qty 15

## 2020-03-27 MED ORDER — LEVALBUTEROL TARTRATE 45 MCG/ACT IN AERO
1.0000 | INHALATION_SPRAY | Freq: Four times a day (QID) | RESPIRATORY_TRACT | Status: DC | PRN
Start: 2020-03-27 — End: 2020-04-04
  Filled 2020-03-27 (×2): qty 15

## 2020-03-27 MED ORDER — SODIUM CHLORIDE 0.9 % IV SOLN
2.0000 g | INTRAVENOUS | Status: DC
  Filled 2020-03-27: qty 20

## 2020-03-27 MED ORDER — ADULT MULTIVITAMIN W/MINERALS CH
1.0000 | ORAL_TABLET | Freq: Every day | ORAL | Status: DC
  Administered 2020-03-28 – 2020-04-02 (×6): 1 via ORAL
  Filled 2020-03-27 (×6): qty 1

## 2020-03-27 MED ORDER — LACTATED RINGERS IV BOLUS
1000.0000 mL | Freq: Once | INTRAVENOUS | Status: AC
  Administered 2020-03-27: 09:00:00 1000 mL via INTRAVENOUS

## 2020-03-27 MED ORDER — POTASSIUM CHLORIDE 10 MEQ/100ML IV SOLN
10.0000 meq | INTRAVENOUS | Status: DC
  Filled 2020-03-27 (×2): qty 100

## 2020-03-27 MED ORDER — LACTATED RINGERS IV BOLUS
500.0000 mL | Freq: Once | INTRAVENOUS | Status: AC
  Administered 2020-03-27: 13:00:00 500 mL via INTRAVENOUS

## 2020-03-27 MED ORDER — SODIUM CHLORIDE 0.9 % IV SOLN
1.0000 g | Freq: Once | INTRAVENOUS | Status: AC
  Administered 2020-03-27: 11:00:00 1 g via INTRAVENOUS
  Filled 2020-03-27: qty 10
  Filled 2020-03-27: qty 1

## 2020-03-27 NOTE — Consult Note (Signed)
Consultation Note Date: 03/27/2020   Patient Name: [name]  DOB: [birth year]  MRN: [id number]  Age / Sex: 79 y.o., male  PCP: Idelle Crouch, MD Referring Physician: Elodia Florence., *  Reason for Consultation: Establishing goals of care  HPI/Patient Profile: 79 y.o. male  with past medical history of HTN, COPD, seizures disorder, Parkinson's, and CAD admitted on 03/24/2020 with shortness of breath and cough. Covid positive. Chest x-ray showed right lower lobe infiltrate. Patient being treated for severe septic shock, superimposed bacterial CAP, and MSSA bacteremia. Patient also had an aspiration event and has been evaluated by SLP. Continues to have runs of SVT. Patient with lethargy, confusion, and poor PO intake. PMT consulted to discuss [place].  Clinical Assessment and Goals of Care: I have reviewed medical records including EPIC notes, labs and imaging, received report from Dr. Florene Glen, SLP, and RN, assessed the patient and then spoke with patient's son [name] to discuss diagnosis prognosis, [place], EOL wishes, disposition and options.  I introduced Palliative Medicine as specialized medical care for people living with serious illness. It focuses on providing relief from the symptoms and stress of a serious illness. The goal is to improve quality of life for both the patient and the family.  Patient's son tells me patient has not been doing well for the past 10-12 years with a steady decline since that time. He specifically tells me that the past year he feels as though the patient has "given up". Tells me patient needs assistance with ADLs, unable to care for himself independently. He tells me he does not think patient has been taking his medications as prescribed. He tells me about poor appetite. Tells me about a history of aspiration events.    We discussed patient's current illness and what it means in the larger context of  patient's on-going co-morbidities.  Natural disease trajectory and expectations at EOL were discussed. We discussed patient's COVID infection, pna, and sepsis. Discuss his frailty and poor appetite. Discussed risk of aspiration. Discussed difficulties managing his HR.   I attempted to elicit values and goals of care important to the patient. The difference between aggressive medical intervention and comfort care was considered in light of the patient's goals of care.   He shares that he has discussed wishes with patient's wife [name] and they both agree that DNR is appropriate.   He shares that he has historically served as Designer, jewellery; however, he does not have HCPOA documentation. We discuss that patient's next of kin, his wife, is technically decision maker without paper work stating otherwise. He shares that patient's wife also has COVID and she is not in condition to serve as Media planner and she would want son to make medical decisions. Will attempt to reach out to wife to verify.   Discussed with son the importance of continued conversation with family and the medical providers regarding overall plan of care and treatment options, ensuring decisions are within the context of the patient's values and GOCs.    Questions and concerns were addressed. The family was encouraged to call with questions or concerns.   Primary Decision Maker NEXT OF KIN - per son, wife requests son make decisions - will verify    SUMMARY OF RECOMMENDATIONS   - continue current treatment, DNR - will follow along - will reach out to wife to attempt to verify decision maker although she is ill, recently d/c'd from hospital  Code Status/Advance Care Planning:  DNR  Prognosis:   Unable to determine - at risk for acute decompensation, currently unstable with frail baseline  Discharge Planning: To Be Determined      Primary Diagnoses: Present on Admission: . CAD (coronary artery  disease) . Essential hypertension . COPD (chronic obstructive pulmonary disease) ([place]) . Parkinson's variant of multiple system atrophy ([place]) . Sepsis ([place])   I have reviewed the medical record, interviewed the patient and family, and examined the patient. The following aspects are pertinent.  Past Medical History:  Diagnosis Date  . Chest pain   . COPD (chronic obstructive pulmonary disease) ([place])   . Depression   . Diabetes ([place])   . Dysphagia   . Essential tremor   . Heart disease   . Hyperlipidemia   . Hypertension   . Seizure disorder ([place])   . Tardive dyskinesia   . Vitamin D deficiency    Social History   Socioeconomic History  . Marital status: Married    Spouse name: Not on file  . Number of children: Not on file  . Years of education: Not on file  . Highest education level: Not on file  Occupational History  . Not on file  Tobacco Use  . Smoking status: Former Smoker    Quit date: 09/04/2004    Years since quitting: 15.5  . Smokeless tobacco: Never Used  Vaping Use  . Vaping Use: Never used  Substance and Sexual Activity  . Alcohol use: No    Alcohol/week: 0.0 standard drinks  . Drug use: No  . Sexual activity: Not on file  Other Topics Concern  . Not on file  Social History Narrative   Married.   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   Enjoys spending time with family.    Social Determinants of Health   Financial Resource Strain: Not on file  Food Insecurity: Not on file  Transportation Needs: Not on file  Physical Activity: Not on file  Stress: Not on file  Social Connections: Not on file   Family History  Problem Relation Age of Onset  . Lung cancer Father   . Emphysema Father   . Depression Mother   . CAD Brother   . Heart attack Brother        MI in his 20's   Scheduled Meds: . vitamin C  500 mg Oral Daily  . chlorhexidine  15 mL Mouth Rinse BID  . dexamethasone (DECADRON) injection  6 mg Intravenous Q24H  . enoxaparin  (LOVENOX) injection  40 mg Subcutaneous Q24H  . feeding supplement  237 mL Oral TID BM  . insulin aspart  0-5 Units Subcutaneous QHS  . insulin aspart  0-9 Units Subcutaneous TID WC  . linagliptin  5 mg Oral Daily  . mouth rinse  15 mL Mouth Rinse q12n4p  . metoprolol tartrate  25 mg Oral BID  . [START ON 03/28/2020] multivitamin with minerals  1 tablet Oral Daily  . zinc sulfate  220 mg Oral Daily   Continuous Infusions: .  ceFAZolin (ANCEF) IV    . dextrose 5 % and 0.45% NaCl 75 mL/hr at 03/27/20 1507  . folic acid (FOLVITE) IVPB    . remdesivir 100 mg in NS 100 mL 200 mL/hr at 03/27/20 1229   PRN Meds:.acetaminophen **OR** acetaminophen, chlorpheniramine-HYDROcodone, guaiFENesin-dextromethorphan, levalbuterol, metoprolol tartrate, ondansetron **OR** ondansetron (ZOFRAN) IV Allergies  Allergen Reactions  . Levaquin [Levofloxacin In D5w] Nausea And Vomiting  . Penicillins Hives    Childhood allergy. Has patient had a  PCN reaction causing immediate rash, facial/tongue/throat swelling, SOB or lightheadedness with hypotension: Unknown Has patient had a PCN reaction causing severe rash involving mucus membranes or skin necrosis: Unknown Has patient had a PCN reaction that required hospitalization: Unknown Has patient had a PCN reaction occurring within the last 10 years: No If all of the above answers are "NO", then may proceed with Cephalosporin use.     Vital Signs: BP 116/75   Pulse 76   Temp 97.7 F (36.5 C) (Oral)   Resp (!) 22   Ht 6' (1.829 m)   Wt 62.5 kg   SpO2 95%   BMI 18.69 kg/m  Pain Scale: 0-10   Pain Score: 0-No pain   SpO2: SpO2: 95 % O2 Device:SpO2: 95 % O2 Flow Rate: .O2 Flow Rate (L/min): 5 L/min  IO: Intake/output summary:   Intake/Output Summary (Last 24 hours) at 03/27/2020 1609 Last data filed at 03/27/2020 1455 Gross per 24 hour  Intake 2055.6 ml  Output 2002 ml  Net 53.6 ml    LBM: Last BM Date:  (PTA) Baseline Weight: Weight: 59  kg Most recent weight: Weight: 62.5 kg     Palliative Assessment/Data: PPS 20%    The above conversation was completed via telephone due to the visitor restrictions during the COVID-19 pandemic. Thorough chart review and discussion with necessary members of the care team was completed as part of assessment. All issues were discussed and addressed but no physical exam was performed.  Time Total: 65 minutes Greater than 50%  of this time was spent counseling and coordinating care related to the above assessment and plan.  Juel Burrow, DNP, AGNP-C Palliative Medicine Team [phone] Pager: [phone]

## 2020-03-27 NOTE — Plan of Care (Signed)
  Problem: Clinical Measurements: Goal: Ability to maintain clinical measurements within normal limits will improve Outcome: Progressing Goal: Respiratory complications will improve Outcome: Progressing Goal: Cardiovascular complication will be avoided Outcome: Progressing   Problem: Safety: Goal: Ability to remain free from injury will improve Outcome: Progressing   

## 2020-03-27 NOTE — Progress Notes (Addendum)
Patient having increased episodes of sinus tachycardia/ arrhythmia. HR will elevate to 150-160s for about 10-20 seconds, then trend down into low to mid 100s. Episode initially occassional but have become very frequent in the last hour. Patient is having more episodes of increased HR than normal HR. NP Randol Kern notified. Orders placed.   Update: NP Randol Kern notified of BNP results. Per NP, discontinue IVF. Awaiting further orders.   Update: This RN spoke to the patients son [name]. Per [name], he and the patients wife are requesting that patient be DNR. NP Randol Kern notified. No new orders at this time.   Update: Patient HR stays between 105-130 with spikes to 150-160. NP Randol Kern made aware. New orders received.

## 2020-03-27 NOTE — Evaluation (Signed)
Clinical/Bedside Swallow Evaluation Patient Details  Name: [name] MRN: [id number] Date of Birth: 02-21-1941  Today's Date: 03/27/2020 Time: SLP Start Time (ACUTE ONLY): 1430 SLP Stop Time (ACUTE ONLY): 1530 SLP Time Calculation (min) (ACUTE ONLY): 60 min  Past Medical History:  Past Medical History:  Diagnosis Date  . Chest pain   . COPD (chronic obstructive pulmonary disease) ([place])   . Depression   . Diabetes ([place])   . Dysphagia   . Essential tremor   . Heart disease   . Hyperlipidemia   . Hypertension   . Seizure disorder ([place])   . Tardive dyskinesia   . Vitamin D deficiency    Past Surgical History:  Past Surgical History:  Procedure Laterality Date  . CARDIAC CATHETERIZATION N/A 01/19/2016   Procedure: Left Heart Cath and Coronary Angiography;  Surgeon: Jolaine Artist, MD;  Location: [facility];  Service: Cardiovascular;  Laterality: N/A;  . CARDIAC CATHETERIZATION  01/19/2016   Procedure: Coronary Stent Intervention;  Surgeon: Jettie Booze, MD;  Location: [facility];  Service: Cardiovascular;;  . INGUINAL HERNIA REPAIR Left as child   HPI:  Pt is a 79 y.o. male with medical history significant for HTN, COPD, seizures disorder, Parkinson's, dysphagia, CAD, history of COVID-19 exposure from his wife who is currently hospitalized for Covid, who is brought in by EMS with a several day history of shortness of breath and cough, and who has been lying in bed, with poor oral intake, arriving unkempt and soiled and disoriented.  Patient is unable to contribute to history due to altered mental status. Covid positive.  Chest x-ray showed right lower lobe infiltrate. Patient treated with sepsis fluid bolus, IV antibiotics and also started on Decadron.  Dietician is following for Malnutrition; pt has only eaten a few bites of applesauce during admission and is refusing all other food per report.  Pt is cachectic-appearing at Baseline. Pt c/o  Dysphagia.   Assessment / Plan / Recommendation Clinical Impression  Pt appears to present w/ oropharyngeal phase dysphagia w/ suspected pharyngeal phase dysphagia noted, Suspect neuromuscular deficits d/t Baseline dx of Parkinson's Dis. Pt also has generalized overall weakness w/ little to no interest in po trials. Dietician has noted Malnutrition per notes. Pt consumed po trials w/ overt, clinical s/s of aspiration during trials of thin liquids via Cup; this was not immediately noted w/ trials of Nectar consistency liquids via Cup. Pt appears at risk for aspiration and would benefit from a modified diet w/ aspiration precautions and feeding support. During po trials, pt consumed consistencies w/ overt coughing w/ thin liquids; no decline in vocal quality, or change in respiratory presentation during/post trials of Nectar liquids and Purees. Oral phase appeared grossly [facility] w/ timely bolus management and control of bolus propulsion for A-P transfer for swallowing of trials given. Oral clearing achieved w/ these trial consistencies. Solid food trials were not assessed d/t pt's presentation of fatigue and overall weakness; he is also Edentulous -- solid foods require a level of energy and attention to task for effective mastication for safe swallowing. OM Exam appeared [facility] w/ no unilateral weakness but generalized weakness noted. Speech Clear; low volume. Pt fed self w/ full setup support. Recommend a Dysphagia level 1 (puree) w/ Nectar liquids diet consistency, moistened foods. Recommend aspiration precautions, Pills CRUSHED vs WHOLE in Puree for safer swallowing. Recommend REFLUX precautions d/t pt's Baseline Esophageal phase dysmotility as noted on MBSS in 2016 (cervical esophageal phase dysphagia) -- any  Esophageal phase deficits can impact the pharyngeal phase and increase risk for aspiration of Reflux material. Education given on Pills in Puree; food consistencies; general aspiration precautions as posted in  room. NSG and Palliative Care NSG (for [place] recommended) updated on above. ST services will f/u while admitted for education and toleration of diet. SLP Visit Diagnosis: Dysphagia, oropharyngeal phase (R13.12) (baseline Parkinson's Dis, dysphagia in 2016)    Aspiration Risk  Moderate aspiration risk;Risk for inadequate nutrition/hydration    Diet Recommendation  Dysphagia level 1 (puree) w/ Nectar consistency liquids; strict aspiration precautions; Reflux precautions. Feeding support and monitoring at meals.  Medication Administration: Crushed with puree (vs Whole in puree)    Other  Recommendations Recommended Consults:  (Dietician and Palliative Care following) Oral Care Recommendations: Oral care BID;Oral care before and after PO;Staff/trained caregiver to provide oral care Other Recommendations: Order thickener from pharmacy;Prohibited food (jello, ice cream, thin soups);Remove water pitcher;Have oral suction available   Follow up Recommendations  (TBD)      Frequency and Duration min 2x/week  1 week       Prognosis Prognosis for Safe Diet Advancement: Guarded Barriers to Reach Goals: Cognitive deficits;Time post onset;Severity of deficits;Motivation Barriers/Prognosis Comment: Parkinson's Dis, malnutrition      Swallow Study   General Date of Onset: 03/22/2020 HPI: Pt is a 79 y.o. male with medical history significant for HTN, COPD, seizures disorder, Parkinson's, dysphagia, CAD, history of COVID-19 exposure from his wife who is currently hospitalized for Covid, who is brought in by EMS with a several day history of shortness of breath and cough, and who has been lying in bed, with poor oral intake, arriving unkempt and soiled and disoriented.  Patient is unable to contribute to history due to altered mental status. Covid positive.  Chest x-ray showed right lower lobe infiltrate. Patient treated with sepsis fluid bolus, IV antibiotics and also started on Decadron.  Dietician is  following for Malnutrition; pt has only eaten a few bites of applesauce during admission and is refusing all other food per report.  Pt is cachectic-appearing at Baseline. Pt c/o Dysphagia. Type of Study: Bedside Swallow Evaluation Previous Swallow Assessment: MBSS in 2016 -  Moderate oral phase dysphagia;Moderate pharyngeal phase dysphagia;Mild cervical esophageal phase dysphagia Diet Prior to this Study: Regular;Thin liquids Temperature Spikes Noted: No (wbc 11.0) Respiratory Status: Nasal cannula (3-6L) History of Recent Intubation: No Behavior/Cognition: Alert;Cooperative;Pleasant mood Oral Cavity Assessment: Dry Oral Care Completed by SLP: Yes Oral Cavity - Dentition: Edentulous Vision: Functional for self-feeding Self-Feeding Abilities: Able to feed self;Needs set up;Needs assist Patient Positioning: Upright in bed (needed full positioning) Baseline Vocal Quality: Low vocal intensity Volitional Cough: Weak Volitional Swallow: Able to elicit    Oral/Motor/Sensory Function Overall Oral Motor/Sensory Function: Within functional limits (grossly)   Ice Chips Ice chips: Within functional limits Presentation: Spoon (fed; 2 trials) Other Comments: overall weakness   Thin Liquid Thin Liquid: Impaired Presentation: Cup;Self Fed (4 trials) Oral Phase Impairments:  (adequate) Pharyngeal  Phase Impairments: Cough - Delayed;Cough - Immediate (x1 each)    Nectar Thick Nectar Thick Liquid: Within functional limits (grossly) Presentation: Cup;Self Fed (5 trials accepted)   Honey Thick Honey Thick Liquid: Not tested   Puree Puree: Within functional limits Presentation: Spoon (fed; 3 trials accepted)   Solid     Solid: Not tested Other Comments: pt is too weak and Edentulous for this consistency at this time       Orinda Kenner, [place], [place]  [place] 03/27/2020,4:45 PM

## 2020-03-27 NOTE — Consult Note (Addendum)
PHARMACIST - PHYSICIAN COMMUNICATION  CONCERNING:  Enoxaparin (Lovenox) for DVT Prophylaxis    RECOMMENDATION: Patient was prescribed enoxaparin 60 mg q12h for empiric VTE treatment in setting of COVID-19 infection with associated elevated D-dimer. [place] legs negative for DVT and CTA negative for PE  Filed Weights   03/26/20 1335 03/26/20 1715 03/27/20 0417  Weight: 60.4 kg (133 lb 2.5 oz) 60.6 kg (133 lb 9.6 oz) 62.5 kg (137 lb 12.8 oz)    Body mass index is 18.69 kg/m.  Estimated Creatinine Clearance: 68.4 mL/min (by C-G formula based on SCr of 0.68 mg/dL).   Patient is candidate for enoxaparin 40 mg every 24 hours based on CrCl > 36ml/min and Weight > 45kg  DESCRIPTION: Pharmacy consulted to initiate standard dose Lovenox for VTE prophylaxis  Patient is now receiving enoxaparin 40 mg every 24 hours    Benita Gutter  03/27/2020 9:22 AM

## 2020-03-27 NOTE — Progress Notes (Signed)
Patient was abdominal breathing with heart rate jumping from 160s to 80s.   EKG and vitals obtained.   Lactic acid back up to 5.5 this morning  Keeping patient NPO this am due to coughing and choking with water over night.   Speech has been consulted   Called MD - concern for breathing and labs.  MD is aware

## 2020-03-27 NOTE — Progress Notes (Addendum)
Initial Nutrition Assessment  DOCUMENTATION CODES:   Underweight  INTERVENTION:   Ensure Enlive po TID, each supplement provides 350 kcal and 20 grams of protein  Magic cup TID with meals, each supplement provides 290 kcal and 9 grams of protein  MVI daily   Recommend B12 supplementation   Pt at high refeed risk; recommend monitor potassium, magnesium and phosphorus labs daily until stable  NUTRITION DIAGNOSIS:   Increased nutrient needs related to catabolic illness (COPD, COVID 19) as evidenced by estimated needs.  GOAL:   Patient will meet greater than or equal to 90% of their needs  MONITOR:   PO intake,Supplement acceptance,Labs,Weight trends,Skin,I & O's  REASON FOR ASSESSMENT:   Malnutrition Screening Tool    ASSESSMENT:   79 year old male with history of HTN, COPD, seizure disorder, Parkinson's, DM, NSTEMI, CAD and dysphagia who is admitted with COVID 19 PNA, sepsis, Afib and encephalopathy   Did not see patient today as palliative care consult pending to discuss [place]. Suspect pt with decreased appetite and oral intake at baseline as pt is underweight. Pt likely with poor appetite and oral intake pta r/t COVID 19. Pt with h/o dysphagia; pt is ordered for dysphagia 3 diet but SLP consult is pending. Spoke with RN, pt has only eaten a few bites of applesauce today and is refusing all other food. RD will add supplements and MVI to help pt meet his estimated needs; will change supplements if needed pending SLP evaluation. Pt is at high refeed risk. Per chart, pt is down 8lbs from his last documented weight in March; RD unsure how recently weight loss occurred. RD will follow-up to obtain nutrition related history and exam pending GOC. Pt is at high risk for malnutrition.    Medications reviewed and include: vitamin C, dexamethasone, lovenox, insulin, zinc, azithromycin, ceftriaxone, folic acid, NaCl w/ 5% dextrose @75ml /hr  Labs reviewed: Na 148(H), BUN 28(H), Mg 2.0  wnl Folate- 4.5(L), B12 179(L) Wbc- 11.0(H) cbgs- 142, 105 x 24 hrs  NUTRITION - FOCUSED PHYSICAL EXAM: Unable to perform at this time   Diet Order:   Diet Order            DIET DYS 3 Room service appropriate? Yes; Fluid consistency: Thin  Diet effective now                EDUCATION NEEDS:   Not appropriate for education at this time  Skin:  Skin Assessment: Reviewed RN Assessment  Last BM:  pta  Height:   Ht Readings from Last 1 Encounters:  03/26/20 6' (1.829 m)    Weight:   Wt Readings from Last 1 Encounters:  03/27/20 62.5 kg    Ideal Body Weight:  80.9 kg  BMI:  Body mass index is 18.69 kg/m.  Estimated Nutritional Needs:   Kcal:  2000-2300kcal/day  Protein:  100-115g/day  Fluid:  1.6-1.9L/day  Koleen Distance MS, RD, LDN Please refer to [facility] for RD and/or RD on-call/weekend/after hours pager

## 2020-03-27 NOTE — Consult Note (Signed)
Cardiology Consultation:   Patient ID: [name]; [id number]; 01/13/1941   Admit date: 04/03/2020 Date of Consult: 03/27/2020  Primary Care Provider: Idelle Crouch, MD Primary Cardiologist: [name] Primary Electrophysiologist:  None   Patient Profile:   [name] is a 79 y.o. male with a hx of CAD with NSTEMI in 01/2016 s/p PCI/DES to the OM1, SVT, NSVT, tardive dyskinesia, Parkinson's disease, seizure disorder, DM, HTN, HLD, and COPD who is being seen today for the evaluation of MAT at the request of Dr. Florene Glen.  History of Present Illness:   [name] was previously admitted to [facility] in 12/2015 with chest pain with troponin elevated to 0.27. Echo at that time showed normal LVSF with Gr1DD and no significant valvular abnormalities. Lexiscan MPI showed no evidence of ischemia. He returned to the hospital in 01/2016 with a NSTEMI. LHC showed 99% stenosis of the OM1 which was treated with PCI/DES. EF 50-55%. He was noted to have runs of SVT/NSVT with subsequent outpatient cardiac monitoring showing sinus bradycardia and sinus tachycardia with PVCs. Echo, performed through his PCP's office in 2019 showed an EF of 30%, diffuse HK, normal RVSF, mild AI, and moderate MR. In this setting, he underwent Lexiscan MPI in 04/2018 that showed no evidence of ischemia or scar with an EF of 36% and was overall an intermediate risk study based on his EF. Follow up echo in 04/2018 showed an EF of 19-62% with diastolic dysfunction.  He was admitted to [facility] on 03/26/2020 with severe sepsis and acute hypoxic respiratory failure in the setting of Covid-19 PNA and superimposed bacterial PNA complicated by MSSA bacteremia and acute metabolic encephalopathy. Cardiology is asked to evaluate tachycardic heart rates in the setting of the above. Cardiac labs: BNP 566. Other notable labs: D-dimer 1824, TSH 0.206, potassium 3.6, SCr 0.68, albumin 2.3, WBC 11.0, HGB 13.8, Covid positive,  blood culture + for MSSA. Review of EKG demonstrates MAT, 129 bpm, anterolateral TWI, which was new this morning when compared to EKG in the ED with significant baseline wandering. Telemetry shows MAT/atrial tachycardia.   Past Medical History:  Diagnosis Date  . Chest pain   . COPD (chronic obstructive pulmonary disease) ([place])   . Depression   . Diabetes ([place])   . Dysphagia   . Essential tremor   . Heart disease   . Hyperlipidemia   . Hypertension   . Seizure disorder ([place])   . Tardive dyskinesia   . Vitamin D deficiency     Past Surgical History:  Procedure Laterality Date  . CARDIAC CATHETERIZATION N/A 01/19/2016   Procedure: Left Heart Cath and Coronary Angiography;  Surgeon: Jolaine Artist, MD;  Location: [facility];  Service: Cardiovascular;  Laterality: N/A;  . CARDIAC CATHETERIZATION  01/19/2016   Procedure: Coronary Stent Intervention;  Surgeon: Jettie Booze, MD;  Location: [place] [facility];  Service: Cardiovascular;;  . INGUINAL HERNIA REPAIR Left as child     Home Meds: Prior to Admission medications   Medication Sig Start Date End Date Taking? Authorizing Provider  aspirin EC 81 MG EC tablet Take 1 tablet (81 mg total) by mouth daily. Patient not taking: Reported on 03/26/2020 12/16/15   Regalado, Jerald Kief A, MD  losartan (COZAAR) 100 MG tablet Take 1 tablet (100 mg total) by mouth daily. Patient not taking: No sig reported 03/27/18   Dorothy Spark, MD  metoprolol succinate (TOPROL-XL) 100 MG 24 hr tablet Take 100 mg by mouth  daily. Take with or immediately following a meal. Patient not taking: No sig reported    [provider]  nitroGLYCERIN (NITROSTAT) 0.4 MG SL tablet Place 1 tablet (0.4 mg total) under the tongue every 5 (five) minutes x 3 doses as needed for chest pain. Patient not taking: No sig reported 01/21/16   Reino Bellis B, NP  ondansetron (ZOFRAN ODT) 4 MG disintegrating tablet Take 1 tablet (4 mg total) by mouth every  8 (eight) hours as needed for nausea or vomiting. Patient not taking: No sig reported 05/22/18   Lavonia Drafts, MD  rosuvastatin (CRESTOR) 5 MG tablet Take 1 tablet (5 mg total) by mouth daily. Patient not taking: No sig reported 03/27/18   Dorothy Spark, MD    Inpatient Medications: Scheduled Meds: . vitamin C  500 mg Oral Daily  . chlorhexidine  15 mL Mouth Rinse BID  . dexamethasone (DECADRON) injection  6 mg Intravenous Q24H  . enoxaparin (LOVENOX) injection  1 mg/kg Subcutaneous Q12H  . insulin aspart  0-5 Units Subcutaneous QHS  . insulin aspart  0-9 Units Subcutaneous TID WC  . linagliptin  5 mg Oral Daily  . mouth rinse  15 mL Mouth Rinse q12n4p  . zinc sulfate  220 mg Oral Daily   Continuous Infusions: . azithromycin 500 mg (03/26/20 2254)  . cefTRIAXone (ROCEPHIN)  IV    . [START ON 03/28/2020] cefTRIAXone (ROCEPHIN)  IV    . dextrose    . folic acid (FOLVITE) IVPB    . lactated ringers    . potassium chloride    . remdesivir 100 mg in NS 100 mL     PRN Meds: acetaminophen **OR** acetaminophen, chlorpheniramine-HYDROcodone, guaiFENesin-dextromethorphan, levalbuterol, metoprolol tartrate, ondansetron **OR** ondansetron (ZOFRAN) IV  Allergies:   Allergies  Allergen Reactions  . Levaquin [Levofloxacin In D5w] Nausea And Vomiting  . Penicillins Hives    Childhood allergy. Has patient had a PCN reaction causing immediate rash, facial/tongue/throat swelling, SOB or lightheadedness with hypotension: Unknown Has patient had a PCN reaction causing severe rash involving mucus membranes or skin necrosis: Unknown Has patient had a PCN reaction that required hospitalization: Unknown Has patient had a PCN reaction occurring within the last 10 years: No If all of the above answers are "NO", then may proceed with Cephalosporin use.     Social History:   Social History   Socioeconomic History  . Marital status: Married    Spouse name: Not on file  . Number of  children: Not on file  . Years of education: Not on file  . Highest education level: Not on file  Occupational History  . Not on file  Tobacco Use  . Smoking status: Former Smoker    Quit date: 09/04/2004    Years since quitting: 15.5  . Smokeless tobacco: Never Used  Vaping Use  . Vaping Use: Never used  Substance and Sexual Activity  . Alcohol use: No    Alcohol/week: 0.0 standard drinks  . Drug use: No  . Sexual activity: Not on file  Other Topics Concern  . Not on file  Social History Narrative   Married.   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   Enjoys spending time with family.    Social Determinants of Health   Financial Resource Strain: Not on file  Food Insecurity: Not on file  Transportation Needs: Not on file  Physical Activity: Not on file  Stress: Not on file  Social Connections: Not on file  Intimate Partner  Violence: Not on file     Family History:   Family History  Problem Relation Age of Onset  . Lung cancer Father   . Emphysema Father   . Depression Mother   . CAD Brother   . Heart attack Brother        MI in his 75's    ROS:  Review of Systems  Unable to perform ROS: Other   Acute metabolic encephalopathy   Physical Exam/Data:   Vitals:   03/27/20 0656 03/27/20 0700 03/27/20 0744 03/27/20 0745  BP:  (P) 109/68 117/62 116/75  Pulse:  (P) 97 90 76  Resp: (!) 22     Temp:   97.7 F (36.5 C) 97.7 F (36.5 C)  TempSrc:   Oral Oral  SpO2:   95% 95%  Weight:      Height:        Intake/Output Summary (Last 24 hours) at 03/27/2020 0909 Last data filed at 03/27/2020 0708 Gross per 24 hour  Intake 828.16 ml  Output 2000 ml  Net -1171.84 ml   Filed Weights   03/26/20 1335 03/26/20 1715 03/27/20 0417  Weight: 60.4 kg 60.6 kg 62.5 kg   Body mass index is 18.69 kg/m.   Physical Exam: Deferred given Covid status. Please see MD attestation.    EKG:  The EKG was personally reviewed and demonstrates: MAT, 129 bpm, anterolateral  TWI, which was new this morning when compared to EKG in the ED with significant baseline wandering Telemetry:  Telemetry was personally reviewed and demonstrates: MAT/atrial tachycardia  Weights: Filed Weights   03/26/20 1335 03/26/20 1715 03/27/20 0417  Weight: 60.4 kg 60.6 kg 62.5 kg    Relevant CV Studies:  2D echo 04/2018: 1. The left ventricle has normal systolic function of 67-67%. The cavity  size is normal. There is mild left ventricular wall thickness. Echo  evidence of impaired relaxation diastolic filling patterns.  2. Normal left atrial size.  3. Normal right atrial size.  4. Normal tricuspid valve.  5. The aortic valve normal. There is mild thickening of the aortic valve.  6. The aortic root and ascending aortaare normal is size and structure.  7. The interatrial septum was not assessed. __________  Carlton Adam MPI 04/2018:  Nuclear stress EF: 36%. The left ventricular ejection fraction is moderately decreased (30-44%).  There ST and T wave changes at baseline.  There was no ST segment deviation noted during stress.  This is an intermediate risk study based on the low ejection fraction. There is no evidence of ischemia and no evidence of previous myocardial infarction. __________  Outpatient cardiac monitoring 01/2016:  Sinus bradycardia to sinus tachycardia.  Infrequent PVCs.   No significant arrhythmias.  __________  LHC 01/2016:  Ost Cx lesion, 30 %stenosed.  1st Mrg lesion, 99 %stenosed.  Dist LAD-2 lesion, 30 %stenosed.  Dist LAD-1 lesion, 60 %stenosed.  Mid LAD lesion, 30 %stenosed.  Prox RCA to Mid RCA lesion, 40 %stenosed.  Mid RCA to Dist RCA lesion, 20 %stenosed.  The left ventricular ejection fraction is 50-55% by visual estimate.   Assessment:  1. CAD with high-grade lesion in OM-1 with non-obstructive disease in LAD and RCA 2. LVEF 50-55% with hypokinesis in the mid-inferior wall 3. LVEDP =  15  Plan/Discussion:  Initially the OM-1 was completely occluded but on follow-up angiograms it re-perfused and showed a high-grade 99% lesion in the midsection. Plan PCI of OM-1 with Dr. Irish Lack. Will need aggressive medical management of CAD in  LAD and RCA.   PCI 01/2016:  Mid Cx lesion, 95 %stenosed, initially 100%. A STENT SYNERGY DES 2.5X28 drug eluting stent was successfully placed, postdilated to > 3 mm.  Post intervention, there is a 0% residual stenosis. __________  2D echo 12/2015: - Procedure narrative: Transthoracic echocardiography. Image  quality was fair. The study was technically difficult, as a  result of poor sound wave transmission. Intravenous contrast  (Definity) was administered.  - Left ventricle: The cavity size was normal. Wall thickness was  increased in a pattern of mild LVH. Systolic function was normal.  The estimated ejection fraction was in the range of 60% to 65%.  Wall motion was normal; there were no regional wall motion  abnormalities. Doppler parameters are consistent with abnormal  left ventricular relaxation (grade 1 diastolic dysfunction).  - Aortic valve: Mildly calcified annulus. Trileaflet.  Laboratory Data:  Chemistry Recent Labs  Lab 03/22/2020 2158 03/26/20 1030 03/27/20 0149  NA 152* 148* 148*  K 3.9 4.3 3.6  CL 106 112* 113*  CO2 26 26 27   GLUCOSE 129* 119* 88  BUN 45* 34* 28*  CREATININE 1.21 0.70 0.68  CALCIUM 8.6* 7.7* 7.7*  GFRNONAA >60 >60 >60  ANIONGAP 20* 10 8    Recent Labs  Lab 04/07/2020 2158 03/26/20 1030 03/27/20 0149  PROT 7.7 5.6* 5.3*  ALBUMIN 3.2* 2.5* 2.3*  AST 45* 28 26  ALT 16 10 11   ALKPHOS 61 49 47  BILITOT 2.5* 1.4* 1.0   Hematology Recent Labs  Lab 03/20/2020 2158 03/26/20 0507 03/27/20 0149  WBC 12.2* 8.0 11.0*  RBC 6.12* 4.84 4.47  HGB 18.3* 14.5 13.8  HCT 55.2* 43.7 40.9  MCV 90.2 90.3 91.5  MCH 29.9 30.0 30.9  MCHC 33.2 33.2 33.7  RDW 14.1 13.8 13.7  PLT 302 201  182   Cardiac EnzymesNo results for input(s): TROPONINI in the last 168 hours. No results for input(s): TROPIPOC in the last 168 hours.  BNP Recent Labs  Lab 03/27/20 0149  BNP 566.2*    DDimer No results for input(s): DDIMER in the last 168 hours.  Radiology/Studies:  CT ANGIO CHEST PE W OR WO CONTRAST  Result Date: 03/26/2020 IMPRESSION: 1. No CT evidence of pulmonary embolism. 2. Right lower lobe pneumonia. Aspiration is not excluded. Clinical correlation and follow-up to resolution recommended. 3. Aortic Atherosclerosis (ICD10-I70.0) and Emphysema (ICD10-J43.9). Electronically Signed   By: Anner Crete M.D.   On: 03/26/2020 15:54   US Venous Img Lower Bilateral (DVT)  Result Date: 03/27/2020 IMPRESSION: No evidence of DVT within either lower extremity Electronically Signed   By: Sandi Mariscal M.D.   On: 03/27/2020 08:01   DG Chest Port 1 View  Result Date: 04/03/2020 IMPRESSION: Patchy right basilar pulmonary infiltrate, possibly infectious in the acute setting. Electronically Signed   By: Fidela Salisbury MD   On: 04/08/2020 22:16    Assessment and Plan:   1. MAT/atrial tachycardia: -Likely in the setting of the patient's underlying severe acute illness with recommendation for continued therapy as outlined by IM -Echo pending -TSH low at 0.206, freeT4 pending -Recommend repletion of potassium to goal 4.0 -PRN IV metoprolol for sustained heart rates > 120 bpm -Magnesium pending  2. Severe sepsis with acute hypoxic respiratory failure secondary to Covid-19 PNA, superimposed bacteria PNA, and COPD complicated by MSSA bacteremia: -Management per IM/ID -Surface echo pending -May need TEE once cleared medically/covid given MSSA bacteremia  -Chest CTA pending  3. CAD involving the native coronary  arteries: -Recommend resuming PTA medical therapy including ASA, metoprolol, losartan, and Crestor as able per IM -Echo pending -No plans for inpatient ischemic evaluation at  this time given his severe acute illness     For questions or updates, please contact [facility] Please consult [email or web address] for contact info under Cardiology/STEMI.   Signed, Christell Faith, PA-C [place] Pager: [phone] 03/27/2020, 9:09 AM

## 2020-03-27 NOTE — Progress Notes (Signed)
PHARMACY - PHYSICIAN COMMUNICATION CRITICAL VALUE ALERT - BLOOD CULTURE IDENTIFICATION (BCID)  [name] is an 79 y.o. male who presented to [facility] on 04/11/2020 with a chief complaint of respiratory failure due to COVID   Assessment:  Staph aureus growing in 1 of 2 bottles (anaerobic) , no resistance detected.  (include suspected source if known)  Name of physician (or Provider) Contacted: Rachael Fee, NP   Current antibiotics: Ceftriaxone and azithromycin   Changes to prescribed antibiotics recommended:  Patient is on recommended antibiotics - No changes needed  Results for orders placed or performed during the hospital encounter of 03/14/2020  Blood Culture ID Panel (Reflexed) (Collected: 03/23/2020  9:58 PM)  Result Value Ref Range   Enterococcus faecalis NOT DETECTED NOT DETECTED   Enterococcus Faecium NOT DETECTED NOT DETECTED   Listeria monocytogenes NOT DETECTED NOT DETECTED   Staphylococcus species DETECTED (A) NOT DETECTED   Staphylococcus aureus (BCID) DETECTED (A) NOT DETECTED   Staphylococcus epidermidis NOT DETECTED NOT DETECTED   Staphylococcus lugdunensis NOT DETECTED NOT DETECTED   Streptococcus species NOT DETECTED NOT DETECTED   Streptococcus agalactiae NOT DETECTED NOT DETECTED   Streptococcus pneumoniae NOT DETECTED NOT DETECTED   Streptococcus pyogenes NOT DETECTED NOT DETECTED   A.calcoaceticus-baumannii NOT DETECTED NOT DETECTED   Bacteroides fragilis NOT DETECTED NOT DETECTED   Enterobacterales NOT DETECTED NOT DETECTED   Enterobacter cloacae complex NOT DETECTED NOT DETECTED   Escherichia coli NOT DETECTED NOT DETECTED   Klebsiella aerogenes NOT DETECTED NOT DETECTED   Klebsiella oxytoca NOT DETECTED NOT DETECTED   Klebsiella pneumoniae NOT DETECTED NOT DETECTED   Proteus species NOT DETECTED NOT DETECTED   Salmonella species NOT DETECTED NOT DETECTED   Serratia marcescens NOT DETECTED NOT DETECTED   Haemophilus influenzae NOT DETECTED  NOT DETECTED   Neisseria meningitidis NOT DETECTED NOT DETECTED   Pseudomonas aeruginosa NOT DETECTED NOT DETECTED   Stenotrophomonas maltophilia NOT DETECTED NOT DETECTED   Candida albicans NOT DETECTED NOT DETECTED   Candida auris NOT DETECTED NOT DETECTED   Candida glabrata NOT DETECTED NOT DETECTED   Candida krusei NOT DETECTED NOT DETECTED   Candida parapsilosis NOT DETECTED NOT DETECTED   Candida tropicalis NOT DETECTED NOT DETECTED   Cryptococcus neoformans/gattii NOT DETECTED NOT DETECTED   Meth resistant mecA/C and MREJ NOT DETECTED NOT DETECTED    [name] 03/27/2020  5:21 AM

## 2020-03-27 NOTE — Progress Notes (Signed)
*  PRELIMINARY RESULTS* Echocardiogram 2D Echocardiogram has been performed.  [name] 03/27/2020, 9:15 AM

## 2020-03-27 NOTE — Consult Note (Signed)
NAME: [name]  DOB: [birth year]  MRN: [id number]  Date/Time: 03/27/2020 2:26 PM  REQUESTING PROVIDER: Dr. Florene Glen Subjective:  REASON FOR CONSULT: Staph aureus bacteremia  ? [name] is a 79 y.o. male with a history of COPD, diabetes, hypertension, seizure disorder presented on 03/31/2020 through EMS for shortness of breath of a few days duration.  As per the history patient's wife has been admitted to the hospital with Covid.  Patient has been in bed for many days, poor intake and arriving unkempt and soiled and disoriented.  Patient is not able to give any history.  In the ED his temperature was 97.2, BP 172/103 heart rate of 95 and respiratory rate of 40/min with oxygen sats of 85% on room air which improved to 100% on 3 L oxygen.  While in the ED he went into rapid A. fib with heart rate as high as 159 . Labs showed a WBC of 12,000, lactate of 7.4 which improved to 2:04 liters bolus.  Hemoglobin was 18.3 sodium of 132, BUN 49 creatinine of 1211 indicating dehydration.  D-dimer was 1824 and bilirubin was 2.9.  Covid was positive.  Chest x-ray showed right lower lobe infiltrate.  Patient was started on ceftriaxone and azithromycin was given IV fluids.  He was also put on Decadron and remdesivir. As the blood culture came back positive for staph aureus I am seeing the patient was on Past Medical History:  Diagnosis Date  . Chest pain   . COPD (chronic obstructive pulmonary disease) ([place])   . Depression   . Diabetes ([place])   . Dysphagia   . Essential tremor   . Heart disease   . Hyperlipidemia   . Hypertension   . Seizure disorder ([place])   . Tardive dyskinesia   . Vitamin D deficiency     Past Surgical History:  Procedure Laterality Date  . CARDIAC CATHETERIZATION N/A 01/19/2016   Procedure: Left Heart Cath and Coronary Angiography;  Surgeon: Jolaine Artist, MD;  Location: [facility];  Service: Cardiovascular;  Laterality: N/A;  . CARDIAC CATHETERIZATION   01/19/2016   Procedure: Coronary Stent Intervention;  Surgeon: Jettie Booze, MD;  Location: [facility];  Service: Cardiovascular;;  . INGUINAL HERNIA REPAIR Left as child    Social History   Socioeconomic History  . Marital status: Married    Spouse name: Not on file  . Number of children: Not on file  . Years of education: Not on file  . Highest education level: Not on file  Occupational History  . Not on file  Tobacco Use  . Smoking status: Former Smoker    Quit date: 09/04/2004    Years since quitting: 15.5  . Smokeless tobacco: Never Used  Vaping Use  . Vaping Use: Never used  Substance and Sexual Activity  . Alcohol use: No    Alcohol/week: 0.0 standard drinks  . Drug use: No  . Sexual activity: Not on file  Other Topics Concern  . Not on file  Social History Narrative   Married.   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   Enjoys spending time with family.    Social Determinants of Health   Financial Resource Strain: Not on file  Food Insecurity: Not on file  Transportation Needs: Not on file  Physical Activity: Not on file  Stress: Not on file  Social Connections: Not on file  Intimate Partner Violence: Not on file    Family History  Problem  Relation Age of Onset  . Lung cancer Father   . Emphysema Father   . Depression Mother   . CAD Brother   . Heart attack Brother        MI in his 20's   Allergies  Allergen Reactions  . Levaquin [Levofloxacin In D5w] Nausea And Vomiting  . Penicillins Hives    Childhood allergy. Has patient had a PCN reaction causing immediate rash, facial/tongue/throat swelling, SOB or lightheadedness with hypotension: Unknown Has patient had a PCN reaction causing severe rash involving mucus membranes or skin necrosis: Unknown Has patient had a PCN reaction that required hospitalization: Unknown Has patient had a PCN reaction occurring within the last 10 years: No If all of the above answers are "NO", then may  proceed with Cephalosporin use.     Current Facility-Administered Medications  Medication Dose Route Frequency Provider Last Rate Last Admin  . acetaminophen (TYLENOL) tablet 650 mg  650 mg Oral Q6H PRN Athena Masse, MD       Or  . acetaminophen (TYLENOL) suppository 650 mg  650 mg Rectal Q6H PRN Athena Masse, MD      . ascorbic acid (VITAMIN C) tablet 500 mg  500 mg Oral Daily Judd Gaudier V, MD   500 mg at 03/27/20 1208  . azithromycin (ZITHROMAX) 500 mg in sodium chloride 0.9 % 250 mL IVPB  500 mg Intravenous Q24H Elodia Florence., MD 250 mL/hr at 03/26/20 2254 500 mg at 03/26/20 2254  . [START ON 03/28/2020] cefTRIAXone (ROCEPHIN) 2 g in sodium chloride 0.9 % 100 mL IVPB  2 g Intravenous Q24H Benn Moulder, RPH      . chlorhexidine (PERIDEX) 0.12 % solution 15 mL  15 mL Mouth Rinse BID Elodia Florence., MD   15 mL at 03/27/20 0202  . chlorpheniramine-HYDROcodone (TUSSIONEX) 10-8 MG/5ML suspension 5 mL  5 mL Oral Q12H PRN Athena Masse, MD      . dexamethasone (DECADRON) injection 6 mg  6 mg Intravenous Q24H Athena Masse, MD   6 mg at 03/27/20 0202  . dextrose 5 %-0.45 % sodium chloride infusion   Intravenous Continuous Elodia Florence., MD      . enoxaparin (LOVENOX) injection 40 mg  40 mg Subcutaneous Q24H Benita Gutter, RPH      . feeding supplement (ENSURE ENLIVE / ENSURE PLUS) liquid 237 mL  237 mL Oral TID BM Elodia Florence., MD      . folic acid 1 mg in sodium chloride 0.9 % 50 mL IVPB  1 mg Intravenous Daily Elodia Florence., MD      . guaiFENesin-dextromethorphan (ROBITUSSIN DM) 100-10 MG/5ML syrup 10 mL  10 mL Oral Q4H PRN Athena Masse, MD      . insulin aspart (novoLOG) injection 0-5 Units  0-5 Units Subcutaneous QHS Judd Gaudier V, MD      . insulin aspart (novoLOG) injection 0-9 Units  0-9 Units Subcutaneous TID WC Athena Masse, MD      . levalbuterol [facility]) inhaler 1-2 puff  1-2 puff Inhalation Q6H PRN Sharion Settler, NP      . linagliptin (TRADJENTA) tablet 5 mg  5 mg Oral Daily Athena Masse, MD   5 mg at 03/26/20 1104  . MEDLINE mouth rinse  15 mL Mouth Rinse q12n4p Elodia Florence., MD      . metoprolol tartrate (LOPRESSOR) injection 5 mg  5 mg  Intravenous Q4H PRN Elodia Florence., MD      . Derrill Memo ON 03/28/2020] multivitamin with minerals tablet 1 tablet  1 tablet Oral Daily Elodia Florence., MD      . ondansetron [facility]) tablet 4 mg  4 mg Oral Q6H PRN Athena Masse, MD       Or  . ondansetron [facility]) injection 4 mg  4 mg Intravenous Q6H PRN Judd Gaudier V, MD      . potassium chloride 10 mEq in 100 mL IVPB  10 mEq Intravenous Q1 Hr x 2 Benita Gutter, RPH      . remdesivir 100 mg in sodium chloride 0.9 % 100 mL IVPB  100 mg Intravenous Daily Athena Masse, MD 200 mL/hr at 03/27/20 1229 Infusion Verify at 03/27/20 1229  . zinc sulfate capsule 220 mg  220 mg Oral Daily Athena Masse, MD   220 mg at 03/27/20 1208     Abtx:  Anti-infectives (From admission, onward)   Start     Dose/Rate Route Frequency Ordered Stop   03/28/20 1000  cefTRIAXone (ROCEPHIN) 2 g in sodium chloride 0.9 % 100 mL IVPB        2 g 200 mL/hr over 30 Minutes Intravenous Every 24 hours 03/27/20 0827     03/27/20 1000  remdesivir 100 mg in sodium chloride 0.9 % 100 mL IVPB       "Followed by" Linked Group Details   100 mg 200 mL/hr over 30 Minutes Intravenous Daily 03/26/20 0146 03/31/20 0959   03/27/20 0930  cefTRIAXone (ROCEPHIN) 1 g in sodium chloride 0.9 % 100 mL IVPB        1 g 200 mL/hr over 30 Minutes Intravenous  Once 03/27/20 0830 03/27/20 1124   03/26/20 2300  azithromycin (ZITHROMAX) 500 mg in sodium chloride 0.9 % 250 mL IVPB  Status:  Discontinued        500 mg 250 mL/hr over 60 Minutes Intravenous Every 24 hours 03/26/20 0218 03/26/20 1448   03/26/20 2200  cefTRIAXone (ROCEPHIN) 1 g in sodium chloride 0.9 % 100 mL IVPB  Status:  Discontinued        1 g 200 mL/hr over 30  Minutes Intravenous Every 24 hours 03/26/20 2053 03/27/20 0826   03/26/20 2200  azithromycin (ZITHROMAX) 500 mg in sodium chloride 0.9 % 250 mL IVPB        500 mg 250 mL/hr over 60 Minutes Intravenous Every 24 hours 03/26/20 2053 03/31/20 2159   03/26/20 0300  cefTRIAXone (ROCEPHIN) 1 g in sodium chloride 0.9 % 100 mL IVPB  Status:  Discontinued        1 g 200 mL/hr over 30 Minutes Intravenous Every 24 hours 03/26/20 0218 03/26/20 1448   03/26/20 0145  remdesivir 200 mg in sodium chloride 0.9% 250 mL IVPB       "Followed by" Linked Group Details   200 mg 580 mL/hr over 30 Minutes Intravenous Once 03/26/20 0146 03/26/20 0556   04/06/2020 2245  levofloxacin (LEVAQUIN) IVPB 750 mg        750 mg 100 mL/hr over 90 Minutes Intravenous  Once 03/22/2020 2244 03/26/20 0039      REVIEW OF SYSTEMS:  No review of system available from patient. He is confused  Objective:  VITALS:  BP 116/75   Pulse 76   Temp 97.7 F (36.5 C) (Oral)   Resp (!) 22   Ht 6' (1.829 m)   Wt 62.5 kg  SpO2 95%   BMI 18.69 kg/m  PHYSICAL EXAM:  General: Awake, respiratory distress, emaciated, accessory muscles of respiration  Head: Normocephalic, without obvious abnormality, atraumatic. Eyes: Conjunctivae clear, anicteric sclerae. Pupils are equal ENT Nares normal. No drainage or sinus tenderness. Edentulous Neck: Supple, symmetrical, no adenopathy, thyroid: non tender no carotid bruit and no JVD. Back: No CVA tenderness. Lungs: Bilateral rhonchi Heart: Irregular Abdomen: Soft, non-tender,not distended. Bowel sounds normal. No masses Extremities: atraumatic, no cyanosis. No edema. No clubbing Skin:  sun damaged skin over the chest and face Lymph: Cervical, supraclavicular normal. Neurologic: Moves all extremities Pertinent Labs Lab Results CBC    Component Value Date/Time   WBC 11.0 (H) 03/27/2020 0149   RBC 4.47 03/27/2020 0149   HGB 13.8 03/27/2020 0149   HGB 13.0 05/16/2014 0322   HCT 40.9  03/27/2020 0149   HCT 44.5 12/14/2015 1423   PLT 182 03/27/2020 0149   PLT 159 05/16/2014 0322   MCV 91.5 03/27/2020 0149   MCV 85 05/16/2014 0322   MCH 30.9 03/27/2020 0149   MCHC 33.7 03/27/2020 0149   RDW 13.7 03/27/2020 0149   RDW 13.8 05/16/2014 0322   LYMPHSABS 0.7 03/27/2020 0149   LYMPHSABS 2.3 05/16/2014 0322   MONOABS 0.9 03/27/2020 0149   MONOABS 0.5 05/16/2014 0322   EOSABS 0.0 03/27/2020 0149   EOSABS 0.4 05/16/2014 0322   BASOSABS 0.0 03/27/2020 0149   BASOSABS 0.1 05/16/2014 0322    CMP Latest Ref Rng & Units 03/27/2020 03/26/2020 03/15/2020  Glucose 70 - 99 mg/dL 88 119(H) 129(H)  BUN 8 - 23 mg/dL 28(H) 34(H) 45(H)  Creatinine 0.61 - 1.24 mg/dL 0.68 0.70 1.21  Sodium 135 - 145 mmol/L 148(H) 148(H) 152(H)  Potassium 3.5 - 5.1 mmol/L 3.6 4.3 3.9  Chloride 98 - 111 mmol/L 113(H) 112(H) 106  CO2 22 - 32 mmol/L 27 26 26   Calcium 8.9 - 10.3 mg/dL 7.7(L) 7.7(L) 8.6(L)  Total Protein 6.5 - 8.1 g/dL 5.3(L) 5.6(L) 7.7  Total Bilirubin 0.3 - 1.2 mg/dL 1.0 1.4(H) 2.5(H)  Alkaline Phos 38 - 126 U/L 47 49 61  AST 15 - 41 U/L 26 28 45(H)  ALT 0 - 44 U/L 11 10 16       Microbiology: Recent Results (from the past 240 hour(s))  Blood culture (routine single)     Status: None (Preliminary result)   Collection Time: 03/15/2020  9:58 PM   Specimen: BLOOD  Result Value Ref Range Status   Specimen Description BLOOD BLOOD RIGHT FOREARM  Final   Special Requests   Final    BOTTLES DRAWN AEROBIC AND ANAEROBIC Blood Culture adequate volume   Culture  Setup Time   Final    GRAM POSITIVE COCCI ANAEROBIC BOTTLE ONLY Organism ID to follow CRITICAL RESULT CALLED TO, READ BACK BY AND VERIFIED WITHVioleta Gelinas AT 0093 03/27/20 SDR Performed at [facility], [address], [place], [place] [address]    Culture GRAM POSITIVE COCCI  Final   Report Status PENDING  Incomplete  Resp Panel by RT-PCR (Flu A&B, Covid) Nasopharyngeal Swab     Status: Abnormal   Collection  Time: 03/22/2020  9:58 PM   Specimen: Nasopharyngeal Swab; Nasopharyngeal(NP) swabs in vial transport medium  Result Value Ref Range Status   SARS Coronavirus 2 by RT PCR POSITIVE (A) NEGATIVE Final    Comment: RESULT CALLED TO, READ BACK BY AND VERIFIED WITH: JEN AGNEW RN 2324 03/18/2020 (NOTE) SARS-CoV-2 target nucleic acids are DETECTED.  The SARS-CoV-2  RNA is generally detectable in upper respiratory specimens during the acute phase of infection. Positive results are indicative of the presence of the identified virus, but do not rule out bacterial infection or co-infection with other pathogens not detected by the test. Clinical correlation with patient history and other diagnostic information is necessary to determine patient infection status. The expected result is Negative.  Fact Sheet for Patients: EntrepreneurPulse.com.au  Fact Sheet for Healthcare Providers: IncredibleEmployment.be  This test is not yet approved or cleared by the [place] FDA and  has been authorized for detection and/or diagnosis of SARS-CoV-2 by FDA under an Emergency Use Authorization (EUA).  This EUA will remain in effect (meaning this test can be used)  for the duration of  the COVID-19 declaration under Section 564(b)(1) of the Act, 21 U.S.C. section 360bbb-3(b)(1), unless the authorization is terminated or revoked sooner.     Influenza A by PCR NEGATIVE NEGATIVE Final   Influenza B by PCR NEGATIVE NEGATIVE Final    Comment: (NOTE) The Xpert Xpress SARS-CoV-2/FLU/RSV plus assay is intended as an aid in the diagnosis of influenza from Nasopharyngeal swab specimens and should not be used as a sole basis for treatment. Nasal washings and aspirates are unacceptable for Xpert Xpress SARS-CoV-2/FLU/RSV testing.  Fact Sheet for Patients: EntrepreneurPulse.com.au  Fact Sheet for Healthcare  Providers: IncredibleEmployment.be  This test is not yet approved or cleared by the [place] FDA and has been authorized for detection and/or diagnosis of SARS-CoV-2 by FDA under an Emergency Use Authorization (EUA). This EUA will remain in effect (meaning this test can be used) for the duration of the COVID-19 declaration under Section 564(b)(1) of the Act, 21 U.S.C. section 360bbb-3(b)(1), unless the authorization is terminated or revoked.  Performed at [facility], [address] [place], [place] [address]   Blood Culture ID Panel (Reflexed)     Status: Abnormal   Collection Time: 03/22/2020  9:58 PM  Result Value Ref Range Status   Enterococcus faecalis NOT DETECTED NOT DETECTED Final   Enterococcus Faecium NOT DETECTED NOT DETECTED Final   Listeria monocytogenes NOT DETECTED NOT DETECTED Final   Staphylococcus species DETECTED (A) NOT DETECTED Final    Comment: CRITICAL RESULT CALLED TO, READ BACK BY AND VERIFIED WITH:  JASON ROBBINS AT 0509 03/27/20 SDR    Staphylococcus aureus (BCID) DETECTED (A) NOT DETECTED Final    Comment: CRITICAL RESULT CALLED TO, READ BACK BY AND VERIFIED WITH:  JASON ROBBINS AT 0509 03/27/20 SDR    Staphylococcus epidermidis NOT DETECTED NOT DETECTED Final   Staphylococcus lugdunensis NOT DETECTED NOT DETECTED Final   Streptococcus species NOT DETECTED NOT DETECTED Final   Streptococcus agalactiae NOT DETECTED NOT DETECTED Final   Streptococcus pneumoniae NOT DETECTED NOT DETECTED Final   Streptococcus pyogenes NOT DETECTED NOT DETECTED Final   A.calcoaceticus-baumannii NOT DETECTED NOT DETECTED Final   Bacteroides fragilis NOT DETECTED NOT DETECTED Final   Enterobacterales NOT DETECTED NOT DETECTED Final   Enterobacter cloacae complex NOT DETECTED NOT DETECTED Final   Escherichia coli NOT DETECTED NOT DETECTED Final   Klebsiella aerogenes NOT DETECTED NOT DETECTED Final   Klebsiella oxytoca NOT DETECTED NOT  DETECTED Final   Klebsiella pneumoniae NOT DETECTED NOT DETECTED Final   Proteus species NOT DETECTED NOT DETECTED Final   Salmonella species NOT DETECTED NOT DETECTED Final   Serratia marcescens NOT DETECTED NOT DETECTED Final   Haemophilus influenzae NOT DETECTED NOT DETECTED Final   Neisseria meningitidis NOT DETECTED NOT DETECTED Final  Pseudomonas aeruginosa NOT DETECTED NOT DETECTED Final   Stenotrophomonas maltophilia NOT DETECTED NOT DETECTED Final   Candida albicans NOT DETECTED NOT DETECTED Final   Candida auris NOT DETECTED NOT DETECTED Final   Candida glabrata NOT DETECTED NOT DETECTED Final   Candida krusei NOT DETECTED NOT DETECTED Final   Candida parapsilosis NOT DETECTED NOT DETECTED Final   Candida tropicalis NOT DETECTED NOT DETECTED Final   Cryptococcus neoformans/gattii NOT DETECTED NOT DETECTED Final   Meth resistant mecA/C and MREJ NOT DETECTED NOT DETECTED Final    Comment: Performed at [facility], [address] [place], [place] [address]  MRSA PCR Screening     Status: Abnormal   Collection Time: 03/27/20 12:00 PM   Specimen: Nasopharyngeal  Result Value Ref Range Status   MRSA by PCR POSITIVE (A) NEGATIVE Final    Comment:        The GeneXpert MRSA Assay (FDA approved for NASAL specimens only), is one component of a comprehensive MRSA colonization surveillance program. It is not intended to diagnose MRSA infection nor to guide or monitor treatment for MRSA infections. RESULT CALLED TO, READ BACK BY AND VERIFIED WITHMaryland Pink AT 5427 03/27/20 SDR Performed at [facility], [address] [place], [place] [address]     IMAGING RESULTS:  I have personally reviewed the films No CT evidence of pulmonary embolism. 2. Right lower lobe pneumonia. Aspiration is not excluded. Clinical correlation and follow-up to resolution recommended  ? Impression/Recommendation ? ?Acute hypoxic respiratory failure secondary to  Covid. Covid pneumonia: Getting remdesivir and Decadron  Staph aureus bacteremia secondary to the above.  Stop ceftriaxone and azithromycin and changed to cefazolin.  We will get a 2D echo and repeat blood cultures  COPD  Encephalopathy secondary to infection Unclear what his baseline status is  ?Diarrhea 2 episodes likely due to the infection and IV fluids and antibiotics.  At this time there is no reason to suspect C. difficile.  ___________________________________________________ Discussed with care team ID will follow Note:  This document was prepared using Dragon voice recognition software and may include unintentional dictation errors.

## 2020-03-27 NOTE — Progress Notes (Signed)
PROGRESS NOTE    [name]  TOI:[id number] DOB: 29-Mar-1941 DOA: 03/27/2020 PCP: Idelle Crouch, MD   Chief Complaint  Patient presents with  . Shortness of Breath    Brief Narrative:  [name] is [name] 79 y.o. male with medical history significant for HTN, COPD, seizures disorder, Parkinson's, CAD, with history of COVID-19 exposure from his wife who is currently hospitalized for Covid, who is brought in by EMS with [name] several day history of shortness of breath and cough, and who has been lying in bed, with poor oral intake, arriving unkempt and soiled and disoriented.  Patient is unable to contribute to history due to altered mental status.  On arrival he was afebrile at 97.2, BP 172/103, heart rate 95 respirations 40/min with O2 sat 84% on room air improving to 100% on 3 L.  While in the ER he went into rapid Carlyle Mcelrath. fib with heart rate as high as 159, with O2 sat dropping as low as 80% improving to 99% with 4 L.  Blood work returned with WBC of 12,000 and lactic acid 7.4, improving to 4 following 2 L fluid bolus.  Hemoglobin 18.3 suspect secondary to hemoconcentration.  CMP showed sodium of 152, BUN 45, creatinine 1.21.  D-dimer 1824.  Bilirubin 2.5.  Covid positive.  Chest x-ray showed right lower lobe infiltrate.  Patient treated with sepsis fluid bolus, IV antibiotics and also started on Decadron.  Hospitalist consulted for admission.     Assessment & Plan:   Active Problems:   Essential hypertension   COPD (chronic obstructive pulmonary disease) (HCC)   Seizure disorder (HCC)   Parkinson's variant of multiple system atrophy ([place])   CAD (coronary artery disease)   Severe sepsis (HCC)   Pneumonia due to COVID-19 virus   Acute respiratory failure due to COVID-19 [facility])   Acute hypernatremia   Rapid atrial fibrillation, new onset ([place])   Acute metabolic encephalopathy   Bacterial pneumonia   Sepsis ([place])  Goals of care: son asking about transitioning pt to DNR -  requested he speak with pts wife (his stepmother) in order to make sure everyone is on same page.  He'll call back if they're in agreement.  Will also c/s palliative care, he's interested in [facility] form.  Will discuss with wife/son today, 12/16.  Severe sepsis/septic shock secondary to covid 19 viral pneumonia  Acute hypoxic respiratory failure  Superimposed Bacteria Community Acquired Pneumonia  MSSA Bacteremia - Patient presented with altered mental status, cough and shortness of breath, O2 sat 88% on room air, tachycardia, increased work of breathing requiring 4 L O2, leukocytosis, lactic acid 7.  Afebrile and not hypotensive - CT chest with right lower lobe pneumonia - SLP eval for aspiration - apparently aspirated last night on water - NPO pending speech eval -continue steroids and remdesivir.  Hold off on baricitinib due to concern for bacteremia and superimposed bacterial pneumonia. - follow repeat blood cultures (anaerobic bottle from 12/14 with BCID + for staph aureus), will discuss with ID - continue ceftriaxone and azithromycin  -- maintain O2>88% -elevated d dimer, on full dose lovenox - follow CT PE protocol and LE [place] as well as echo, pending -- lactate, back up today, follow with IVF -- I/O, daily weights -- Prone as able, OOB, flutter, IS, therapy  COVID-19 Labs  No results for input(s): DDIMER, FERRITIN, LDH, CRP in the last 72 hours.  Lab Results  Component Value Date   SARSCOV2NAA POSITIVE ([name]) 04/11/2020  Lactic Acidosis: suspect this is 2/2 hypovolemia/dehydration/sepsis from above, pt still with hypernatremia, reports feeling thirsty, does not clinically appear overloaded on exam.  Follow with IVF.  Elevated BNP noted, though clinically not overloaded at this time (received lasix x1 overnight). - bolus and follow  Supraventricular Tachycardia -EKG with SVT -> initial EKG's read flutter, but poor quality overall --EKG today with sinus tachy/SVT - telemetry with  sinus tachy (mostly in 90's, low 100's, but jumping up at times as fast as 150's+) and frequent ectopy - prn metop for sustained HR > 120 - follow with IVF -Cardiology consult, appreciate recs --follow echo   Elevated D dimer: follow LE [place] (negative for DVT) and CT PE protocol (without PE)  Acute hypernatremia - improving with IVF, continue to monitor  Acute metabolic encephalopathy -Secondary to acute infection and sepsis - delirium precautions -- follow TSH (low follow free T4), vitamin b12 (pending), folate (low, supplement), ammonia (wnl)  Folate Deficiency: follow B12, supplement    Essential hypertension -prn metop, BP ok  --med rec hasn't been completed yet - he's currently NPO pending speech    COPD (chronic obstructive pulmonary disease) (HCC) -Albuterol inhaler scheduled and as needed as per treatment of Covid    Parkinson's variant of multiple system atrophy (HCC) -Chronic and stable    CAD (coronary artery disease) -.  Stable -Continue aspirin, metoprolol, rosuvastatin and nitroglycerin pending med rec  Med rec currently pending, follow with pharmacy  DVT prophylaxis: lovenox Code Status: full  Family Communication: none at bedside Disposition:   Status is: Inpatient  Remains inpatient appropriate because:Inpatient level of care appropriate due to severity of illness   Dispo: The patient is from: Home              Anticipated d/c is to: pending              Anticipated d/c date is: > 3 days              Patient currently is not medically stable to d/c.       Consultants:   none  Procedures:  none  Antimicrobials: Anti-infectives (From admission, onward)   Start     Dose/Rate Route Frequency Ordered Stop   03/28/20 1000  cefTRIAXone (ROCEPHIN) 2 g in sodium chloride 0.9 % 100 mL IVPB        2 g 200 mL/hr over 30 Minutes Intravenous Every 24 hours 03/27/20 0827     03/27/20 1000  remdesivir 100 mg in sodium chloride 0.9 % 100 mL IVPB        "Followed by" Linked Group Details   100 mg 200 mL/hr over 30 Minutes Intravenous Daily 03/26/20 0146 03/31/20 0959   03/27/20 0930  cefTRIAXone (ROCEPHIN) 1 g in sodium chloride 0.9 % 100 mL IVPB        1 g 200 mL/hr over 30 Minutes Intravenous  Once 03/27/20 0830     03/26/20 2300  azithromycin (ZITHROMAX) 500 mg in sodium chloride 0.9 % 250 mL IVPB  Status:  Discontinued        500 mg 250 mL/hr over 60 Minutes Intravenous Every 24 hours 03/26/20 0218 03/26/20 1448   03/26/20 2200  cefTRIAXone (ROCEPHIN) 1 g in sodium chloride 0.9 % 100 mL IVPB  Status:  Discontinued        1 g 200 mL/hr over 30 Minutes Intravenous Every 24 hours 03/26/20 2053 03/27/20 0826   03/26/20 2200  azithromycin (ZITHROMAX) 500 mg in sodium chloride  0.9 % 250 mL IVPB        500 mg 250 mL/hr over 60 Minutes Intravenous Every 24 hours 03/26/20 2053 03/31/20 2159   03/26/20 0300  cefTRIAXone (ROCEPHIN) 1 g in sodium chloride 0.9 % 100 mL IVPB  Status:  Discontinued        1 g 200 mL/hr over 30 Minutes Intravenous Every 24 hours 03/26/20 0218 03/26/20 1448   03/26/20 0145  remdesivir 200 mg in sodium chloride 0.9% 250 mL IVPB       "Followed by" Linked Group Details   200 mg 580 mL/hr over 30 Minutes Intravenous Once 03/26/20 0146 03/26/20 0556   03/19/2020 2245  levofloxacin (LEVAQUIN) IVPB 750 mg        750 mg 100 mL/hr over 90 Minutes Intravenous  Once 03/14/2020 2244 03/26/20 0039         Subjective: Kayonna Lawniczak&Ox3 today No complaints, but apparently told nurse last night he didn't want to live (bc he was feeling poorly), but told me today he was "alright"  Objective: Vitals:   03/27/20 0656 03/27/20 0700 03/27/20 0744 03/27/20 0745  BP:  (P) 109/68 117/62 116/75  Pulse:  (P) 97 90 76  Resp: (!) 22     Temp:   97.7 F (36.5 C) 97.7 F (36.5 C)  TempSrc:   Oral Oral  SpO2:   95% 95%  Weight:      Height:        Intake/Output Summary (Last 24 hours) at 03/27/2020 0838 Last data filed at 03/27/2020  0708 Gross per 24 hour  Intake 828.16 ml  Output 2000 ml  Net -1171.84 ml   Filed Weights   03/26/20 1335 03/26/20 1715 03/27/20 0417  Weight: 60.4 kg 60.6 kg 62.5 kg    Examination:  General: No acute distress. Mucous membranes moist, but he notes thirst Cardiovascular: Heart sounds show Chigozie Basaldua regular rate, and rhythm.  No LE edema. Lungs: coarse breath sounds, greatest on R Abdomen: Soft, nontender, nondistended Neurological: Alert and oriented 3. Moves all extremities 4 with equal strength. Cranial nerves II through XII grossly intact. Skin: Warm and dry. No rashes or lesions. Extremities: No clubbing or cyanosis. No edema.    Data Reviewed: I have personally reviewed following labs and imaging studies  CBC: Recent Labs  Lab 04/11/2020 2158 03/26/20 0507 03/27/20 0149  WBC 12.2* 8.0 11.0*  NEUTROABS 10.1*  --  9.3*  HGB 18.3* 14.5 13.8  HCT 55.2* 43.7 40.9  MCV 90.2 90.3 91.5  PLT 302 201 433    Basic Metabolic Panel: Recent Labs  Lab 03/19/2020 2158 03/26/20 1030 03/27/20 0149  NA 152* 148* 148*  K 3.9 4.3 3.6  CL 106 112* 113*  CO2 26 26 27   GLUCOSE 129* 119* 88  BUN 45* 34* 28*  CREATININE 1.21 0.70 0.68  CALCIUM 8.6* 7.7* 7.7*    GFR: Estimated Creatinine Clearance: 68.4 mL/min (by C-G formula based on SCr of 0.68 mg/dL).  Liver Function Tests: Recent Labs  Lab 04/05/2020 2158 03/26/20 1030 03/27/20 0149  AST 45* 28 26  ALT 16 10 11   ALKPHOS 61 49 47  BILITOT 2.5* 1.4* 1.0  PROT 7.7 5.6* 5.3*  ALBUMIN 3.2* 2.5* 2.3*    CBG: Recent Labs  Lab 03/26/20 0252 03/26/20 0821 03/26/20 1702 03/26/20 2011 03/26/20 2048  GLUCAP 100* 111* 81 67* 131*     Recent Results (from the past 240 hour(s))  Blood culture (routine single)     Status: None (Preliminary  result)   Collection Time: 04/02/2020  9:58 PM   Specimen: BLOOD  Result Value Ref Range Status   Specimen Description BLOOD BLOOD RIGHT FOREARM  Final   Special Requests   Final     BOTTLES DRAWN AEROBIC AND ANAEROBIC Blood Culture adequate volume   Culture  Setup Time   Final    GRAM POSITIVE COCCI ANAEROBIC BOTTLE ONLY Organism ID to follow CRITICAL RESULT CALLED TO, READ BACK BY AND VERIFIED WITHVioleta Gelinas AT 5643 03/27/20 SDR Performed at [facility], [address], [place], [place] [address]    Culture GRAM POSITIVE COCCI  Final   Report Status PENDING  Incomplete  Resp Panel by RT-PCR (Flu Michaelangelo Mittelman&B, Covid) Nasopharyngeal Swab     Status: Abnormal   Collection Time: 03/23/2020  9:58 PM   Specimen: Nasopharyngeal Swab; Nasopharyngeal(NP) swabs in vial transport medium  Result Value Ref Range Status   SARS Coronavirus 2 by RT PCR POSITIVE (Acey Woodfield) NEGATIVE Final    Comment: RESULT CALLED TO, READ BACK BY AND VERIFIED WITH: JEN AGNEW RN [id number] 03/24/2020 (NOTE) SARS-CoV-2 target nucleic acids are DETECTED.  The SARS-CoV-2 RNA is generally detectable in upper respiratory specimens during the acute phase of infection. Positive results are indicative of the presence of the identified virus, but do not rule out bacterial infection or co-infection with other pathogens not detected by the test. Clinical correlation with patient history and other diagnostic information is necessary to determine patient infection status. The expected result is Negative.  Fact Sheet for Patients: EntrepreneurPulse.com.au  Fact Sheet for Healthcare Providers: IncredibleEmployment.be  This test is not yet approved or cleared by the [place] FDA and  has been authorized for detection and/or diagnosis of SARS-CoV-2 by FDA under an Emergency Use Authorization (EUA).  This EUA will remain in effect (meaning this test can be used)  for the duration of  the COVID-19 declaration under Section 564(b)(1) of the Act, 21 U.S.C. section 360bbb-3(b)(1), unless the authorization is terminated or revoked sooner.     Influenza Donesha Wallander by PCR NEGATIVE  NEGATIVE Final   Influenza B by PCR NEGATIVE NEGATIVE Final    Comment: (NOTE) The Xpert Xpress SARS-CoV-2/FLU/RSV plus assay is intended as an aid in the diagnosis of influenza from Nasopharyngeal swab specimens and should not be used as Chaunce Winkels sole basis for treatment. Nasal washings and aspirates are unacceptable for Xpert Xpress SARS-CoV-2/FLU/RSV testing.  Fact Sheet for Patients: EntrepreneurPulse.com.au  Fact Sheet for Healthcare Providers: IncredibleEmployment.be  This test is not yet approved or cleared by the [place] FDA and has been authorized for detection and/or diagnosis of SARS-CoV-2 by FDA under an Emergency Use Authorization (EUA). This EUA will remain in effect (meaning this test can be used) for the duration of the COVID-19 declaration under Section 564(b)(1) of the Act, 21 U.S.C. section 360bbb-3(b)(1), unless the authorization is terminated or revoked.  Performed at [facility], [address] [place], [place] [address]   Blood Culture ID Panel (Reflexed)     Status: Abnormal   Collection Time: 03/19/2020  9:58 PM  Result Value Ref Range Status   Enterococcus faecalis NOT DETECTED NOT DETECTED Final   Enterococcus Faecium NOT DETECTED NOT DETECTED Final   Listeria monocytogenes NOT DETECTED NOT DETECTED Final   Staphylococcus species DETECTED (Christie Copley) NOT DETECTED Final    Comment: CRITICAL RESULT CALLED TO, READ BACK BY AND VERIFIED WITH:  JASON ROBBINS AT 0509 03/27/20 SDR    Staphylococcus aureus (BCID) DETECTED (Antavion Bartoszek) NOT  DETECTED Final    Comment: CRITICAL RESULT CALLED TO, READ BACK BY AND VERIFIED WITH:  JASON ROBBINS AT 0509 03/27/20 SDR    Staphylococcus epidermidis NOT DETECTED NOT DETECTED Final   Staphylococcus lugdunensis NOT DETECTED NOT DETECTED Final   Streptococcus species NOT DETECTED NOT DETECTED Final   Streptococcus agalactiae NOT DETECTED NOT DETECTED Final   Streptococcus pneumoniae NOT  DETECTED NOT DETECTED Final   Streptococcus pyogenes NOT DETECTED NOT DETECTED Final   Mujahid Jalomo.calcoaceticus-baumannii NOT DETECTED NOT DETECTED Final   Bacteroides fragilis NOT DETECTED NOT DETECTED Final   Enterobacterales NOT DETECTED NOT DETECTED Final   Enterobacter cloacae complex NOT DETECTED NOT DETECTED Final   Escherichia coli NOT DETECTED NOT DETECTED Final   Klebsiella aerogenes NOT DETECTED NOT DETECTED Final   Klebsiella oxytoca NOT DETECTED NOT DETECTED Final   Klebsiella pneumoniae NOT DETECTED NOT DETECTED Final   Proteus species NOT DETECTED NOT DETECTED Final   Salmonella species NOT DETECTED NOT DETECTED Final   Serratia marcescens NOT DETECTED NOT DETECTED Final   Haemophilus influenzae NOT DETECTED NOT DETECTED Final   Neisseria meningitidis NOT DETECTED NOT DETECTED Final   Pseudomonas aeruginosa NOT DETECTED NOT DETECTED Final   Stenotrophomonas maltophilia NOT DETECTED NOT DETECTED Final   Candida albicans NOT DETECTED NOT DETECTED Final   Candida auris NOT DETECTED NOT DETECTED Final   Candida glabrata NOT DETECTED NOT DETECTED Final   Candida krusei NOT DETECTED NOT DETECTED Final   Candida parapsilosis NOT DETECTED NOT DETECTED Final   Candida tropicalis NOT DETECTED NOT DETECTED Final   Cryptococcus neoformans/gattii NOT DETECTED NOT DETECTED Final   Meth resistant mecA/C and MREJ NOT DETECTED NOT DETECTED Final    Comment: Performed at [facility], [address] [place], [place] [address]         Radiology Studies: CT ANGIO CHEST PE W OR WO CONTRAST  Result Date: 03/26/2020 CLINICAL DATA:  79 year old male with concern for pulmonary embolism. EXAM: CT ANGIOGRAPHY CHEST WITH CONTRAST TECHNIQUE: Multidetector CT imaging of the chest was performed using the standard protocol during bolus administration of intravenous contrast. Multiplanar CT image reconstructions and MIPs were obtained to evaluate the vascular anatomy. CONTRAST:  58mL OMNIPAQUE  IOHEXOL 350 MG/ML SOLN COMPARISON:  CT dated 08/09/2017. FINDINGS: Cardiovascular: Top-normal cardiac size. No pericardial effusion. There is 3 vessel coronary vascular calcification. Moderate atherosclerotic calcification of the thoracic aorta. No aneurysmal dilatation or dissection. Evaluation of the pulmonary arteries is limited due to respiratory motion artifact. No pulmonary artery embolus identified. Mediastinum/Nodes: Top-normal right hilar lymph nodes, likely reactive. No mediastinal adenopathy. The esophagus is grossly unremarkable. Enlargement of the left thyroid lobe similar to studies dating back to 2015. No mediastinal fluid collection. Lungs/Pleura: Background of moderate to severe emphysema. There is Mozella Rexrode patchy area of airspace opacity involving the right lower lobe and right infrahilar region most consistent with pneumonia. Clinical correlation and follow-up to resolution recommended. There is no pleural effusion pneumothorax. The central airways remain patent. There is however partial occlusion of the right lower lobe bronchus and therefore aspiration or and occlusive lesion is not entirely excluded. Upper Abdomen: No acute abnormality. Musculoskeletal: Osteopenia with degenerative changes of the spine. No acute osseous pathology. Review of the MIP images confirms the above findings. IMPRESSION: 1. No CT evidence of pulmonary embolism. 2. Right lower lobe pneumonia. Aspiration is not excluded. Clinical correlation and follow-up to resolution recommended. 3. Aortic Atherosclerosis (ICD10-I70.0) and Emphysema (ICD10-J43.9). Electronically Signed   By: Laren Everts.D.  On: 03/26/2020 15:54   US Venous Img Lower Bilateral (DVT)  Result Date: 03/27/2020 CLINICAL DATA:  Elevated D-dimer.  Evaluate for DVT. EXAM: BILATERAL LOWER EXTREMITY VENOUS DOPPLER ULTRASOUND TECHNIQUE: Gray-scale sonography with graded compression, as well as color Doppler and duplex ultrasound were performed to evaluate  the lower extremity deep venous systems from the level of the common femoral vein and including the common femoral, femoral, profunda femoral, popliteal and calf veins including the posterior tibial, peroneal and gastrocnemius veins when visible. The superficial great saphenous vein was also interrogated. Spectral Doppler was utilized to evaluate flow at rest and with distal augmentation maneuvers in the common femoral, femoral and popliteal veins. COMPARISON:  None. FINDINGS: RIGHT LOWER EXTREMITY Common Femoral Vein: No evidence of thrombus. Normal compressibility, respiratory phasicity and response to augmentation. Saphenofemoral Junction: No evidence of thrombus. Normal compressibility and flow on color Doppler imaging. Profunda Femoral Vein: No evidence of thrombus. Normal compressibility and flow on color Doppler imaging. Femoral Vein: No evidence of thrombus. Normal compressibility, respiratory phasicity and response to augmentation. Popliteal Vein: No evidence of thrombus. Normal compressibility, respiratory phasicity and response to augmentation. Calf Veins: No evidence of thrombus. Normal compressibility and flow on color Doppler imaging. Superficial Great Saphenous Vein: No evidence of thrombus. Normal compressibility. Venous Reflux:  None. Other Findings:  None. LEFT LOWER EXTREMITY Common Femoral Vein: No evidence of thrombus. Normal compressibility, respiratory phasicity and response to augmentation. Saphenofemoral Junction: No evidence of thrombus. Normal compressibility and flow on color Doppler imaging. Profunda Femoral Vein: No evidence of thrombus. Normal compressibility and flow on color Doppler imaging. Femoral Vein: No evidence of thrombus. Normal compressibility, respiratory phasicity and response to augmentation. Popliteal Vein: No evidence of thrombus. Normal compressibility, respiratory phasicity and response to augmentation. Calf Veins: No evidence of thrombus. Normal compressibility and  flow on color Doppler imaging. Superficial Great Saphenous Vein: No evidence of thrombus. Normal compressibility. Venous Reflux:  None. Other Findings:  None. IMPRESSION: No evidence of DVT within either lower extremity Electronically Signed   By: Sandi Mariscal M.D.   On: 03/27/2020 08:01   DG Chest Port 1 View  Result Date: 03/18/2020 CLINICAL DATA:  Sepsis EXAM: PORTABLE CHEST 1 VIEW COMPARISON:  05/22/2018 FINDINGS: Patchy right basilar pulmonary infiltrate is present, possibly infectious in the acute setting. The lungs are otherwise clear. No pneumothorax or pleural effusion. Cardiac size within normal limits. Pulmonary vascularity is normal. No acute bone abnormality. IMPRESSION: Patchy right basilar pulmonary infiltrate, possibly infectious in the acute setting. Electronically Signed   By: Fidela Salisbury MD   On: 03/17/2020 22:16        Scheduled Meds: . vitamin C  500 mg Oral Daily  . chlorhexidine  15 mL Mouth Rinse BID  . dexamethasone (DECADRON) injection  6 mg Intravenous Q24H  . enoxaparin (LOVENOX) injection  1 mg/kg Subcutaneous Q12H  . insulin aspart  0-5 Units Subcutaneous QHS  . insulin aspart  0-9 Units Subcutaneous TID WC  . linagliptin  5 mg Oral Daily  . mouth rinse  15 mL Mouth Rinse q12n4p  . zinc sulfate  220 mg Oral Daily   Continuous Infusions: . azithromycin 500 mg (03/26/20 2254)  . cefTRIAXone (ROCEPHIN)  IV    . [START ON 03/28/2020] cefTRIAXone (ROCEPHIN)  IV    . dextrose    . lactated ringers    . remdesivir 100 mg in NS 100 mL       LOS: 1 day    Time spent: over  30 min    Fayrene Helper, MD [facility]   To contact the attending provider between 7A-7P or the covering provider during after hours 7P-7A, please log into the web site [email or web address] and access using universal [place] password for that web site. If you do not have the password, please call the hospital operator.  03/27/2020, 8:38 AM

## 2020-03-28 DIAGNOSIS — Z66 Do not resuscitate: Secondary | ICD-10-CM | POA: Diagnosis not present

## 2020-03-28 DIAGNOSIS — R627 Adult failure to thrive: Secondary | ICD-10-CM

## 2020-03-28 DIAGNOSIS — Z7189 Other specified counseling: Secondary | ICD-10-CM | POA: Diagnosis not present

## 2020-03-28 DIAGNOSIS — Z515 Encounter for palliative care: Secondary | ICD-10-CM | POA: Diagnosis not present

## 2020-03-28 DIAGNOSIS — I471 Supraventricular tachycardia: Secondary | ICD-10-CM

## 2020-03-28 DIAGNOSIS — R0602 Shortness of breath: Secondary | ICD-10-CM | POA: Diagnosis not present

## 2020-03-28 LAB — COMPREHENSIVE METABOLIC PANEL
ALT: 13 U/L (ref 0–44)
AST: 27 U/L (ref 15–41)
Albumin: 2.5 g/dL — ABNORMAL LOW (ref 3.5–5.0)
Alkaline Phosphatase: 42 U/L (ref 38–126)
Anion gap: 9 (ref 5–15)
BUN: 25 mg/dL — ABNORMAL HIGH (ref 8–23)
CO2: 28 mmol/L (ref 22–32)
Calcium: 7.6 mg/dL — ABNORMAL LOW (ref 8.9–10.3)
Chloride: 109 mmol/L (ref 98–111)
Creatinine, Ser: 0.59 mg/dL — ABNORMAL LOW (ref 0.61–1.24)
GFR, Estimated: >60 mL/min (ref >60)
Glucose, Bld: 127 mg/dL — ABNORMAL HIGH (ref 70–99)
Potassium: 3.6 mmol/L (ref 3.5–5.1)
Sodium: 146 mmol/L — ABNORMAL HIGH (ref 135–145)
Total Bilirubin: 1 mg/dL (ref 0.3–1.2)
Total Protein: 5.3 g/dL — ABNORMAL LOW (ref 6.5–8.1)

## 2020-03-28 LAB — GLUCOSE, CAPILLARY
Glucose-Capillary: 169 mg/dL — ABNORMAL HIGH (ref 70–99)
Glucose-Capillary: 98 mg/dL (ref 70–99)
Glucose-Capillary: 116 mg/dL — ABNORMAL HIGH (ref 70–99)
Glucose-Capillary: 157 mg/dL — ABNORMAL HIGH (ref 70–99)

## 2020-03-28 LAB — CBC WITH DIFFERENTIAL/PLATELET
Abs Immature Granulocytes: 0.15 10*3/uL — ABNORMAL HIGH (ref 0.00–0.07)
Basophils Absolute: 0 10*3/uL (ref 0.0–0.1)
Basophils Relative: 0 %
Eosinophils Absolute: 0 10*3/uL (ref 0.0–0.5)
Eosinophils Relative: 0 %
HCT: 40.9 % (ref 39.0–52.0)
Hemoglobin: 13.5 g/dL (ref 13.0–17.0)
Immature Granulocytes: 1 %
Lymphocytes Relative: 6 %
Lymphs Abs: 0.7 10*3/uL (ref 0.7–4.0)
MCH: 29.8 pg (ref 26.0–34.0)
MCHC: 33 g/dL (ref 30.0–36.0)
MCV: 90.3 fL (ref 80.0–100.0)
Monocytes Absolute: 1 10*3/uL (ref 0.1–1.0)
Monocytes Relative: 9 %
Neutro Abs: 10.1 10*3/uL — ABNORMAL HIGH (ref 1.7–7.7)
Neutrophils Relative %: 84 %
Platelets: 203 10*3/uL (ref 150–400)
RBC: 4.53 MIL/uL (ref 4.22–5.81)
RDW: 13.3 % (ref 11.5–15.5)
WBC: 11.9 10*3/uL — ABNORMAL HIGH (ref 4.0–10.5)
nRBC: 0 % (ref 0.0–0.2)

## 2020-03-28 LAB — FIBRIN DERIVATIVES D-DIMER (ARMC ONLY): Fibrin derivatives D-dimer (ARMC): 937.03 ng/mL (FEU) — ABNORMAL HIGH (ref 0.00–499.00)

## 2020-03-28 LAB — T4, FREE: Free T4: 1.15 ng/dL — ABNORMAL HIGH (ref 0.61–1.12)

## 2020-03-28 LAB — PHOSPHORUS: Phosphorus: 2.4 mg/dL — ABNORMAL LOW (ref 2.5–4.6)

## 2020-03-28 LAB — TSH: TSH: 0.218 u[IU]/mL — ABNORMAL LOW (ref 0.350–4.500)

## 2020-03-28 LAB — FERRITIN: Ferritin: 200 ng/mL (ref 24–336)

## 2020-03-28 LAB — C-REACTIVE PROTEIN: CRP: 2.5 mg/dL — ABNORMAL HIGH (ref <1.0)

## 2020-03-28 LAB — PROCALCITONIN: Procalcitonin: <0.1 ng/mL

## 2020-03-28 LAB — MAGNESIUM: Magnesium: 2.1 mg/dL (ref 1.7–2.4)

## 2020-03-28 LAB — LACTIC ACID, PLASMA: Lactic Acid, Venous: 2.3 mmol/L (ref 0.5–1.9)

## 2020-03-28 MED ORDER — SODIUM CHLORIDE 0.9 % IV SOLN
INTRAVENOUS | Status: DC | PRN
  Administered 2020-03-28 (×2): 500 mL via INTRAVENOUS
  Administered 2020-04-01: 07:00:00 1000 mL via INTRAVENOUS

## 2020-03-28 MED ORDER — MORPHINE SULFATE (PF) 2 MG/ML IV SOLN
1.0000 mg | INTRAVENOUS | Status: DC | PRN
  Administered 2020-03-29 – 2020-04-03 (×8): 1 mg via INTRAVENOUS
  Filled 2020-03-28 (×8): qty 1

## 2020-03-28 MED ORDER — MUPIROCIN 2 % EX OINT
1.0000 "application " | TOPICAL_OINTMENT | Freq: Two times a day (BID) | CUTANEOUS | Status: AC
  Administered 2020-03-28 – 2020-04-01 (×10): 1 via NASAL
  Filled 2020-03-28: qty 22

## 2020-03-28 MED ORDER — CHLORHEXIDINE GLUCONATE CLOTH 2 % EX PADS
6.0000 | MEDICATED_PAD | Freq: Every day | CUTANEOUS | Status: AC
  Administered 2020-03-28 – 2020-04-01 (×5): 6 via TOPICAL

## 2020-03-28 MED ORDER — NEPRO/CARBSTEADY PO LIQD
237.0000 mL | Freq: Three times a day (TID) | ORAL | Status: DC
  Administered 2020-03-28 – 2020-03-31 (×9): 237 mL via ORAL

## 2020-03-28 MED ORDER — THIAMINE HCL 100 MG/ML IJ SOLN
100.0000 mg | Freq: Every day | INTRAMUSCULAR | Status: DC
  Administered 2020-03-28 – 2020-04-01 (×5): 100 mg via INTRAVENOUS
  Filled 2020-03-28 (×5): qty 2

## 2020-03-28 NOTE — TOC Initial Note (Signed)
Transition of Care [facility]) - Initial/Assessment Note    Patient Details  Name: [name] MRN: [id number] Date of Birth: [birth year]  Transition of Care [facility]) CM/SW Contact:    Eileen Stanford, LCSW Phone Number: 03/28/2020, 2:24 PM  Clinical Narrative: CSW spoke with pt's son via telephone. Pt's son is concerned about pt's living situation. Pt's son states pt lives with his spouse however, his concern is pt's wife is a Ship broker and the apartment is a "death trap." Pt's son states they live in a trailer and there are roaches. Pt's daughter has gotten the trailer sprayed but pt's son states the company told them it wouldn't get better unless the inside was cleaned. Per pt's son pt walked with a cane prior to admission. Pt's son states pt would go through episodes of a depressive mood where he didn't want to do anything. Pt's son states he doubts pt will make it out of the hospital but if he does he is agreeable and prefers SNF. Pt's son states he would prefer [facility]--CSW explained pt would have to go to a facility that accepts Covid positive patients--pt's son understanding.                  Expected Discharge Plan: Skilled Nursing Facility Barriers to Discharge: Continued Medical Work up   Patient Goals and CMS Choice Patient states their goals for this hospitalization and ongoing recovery are:: to make it out of the hospital per son   Choice offered to / list presented to : Adult Children  Expected Discharge Plan and Services Expected Discharge Plan: [place] In-house Referral: Clinical Social Work   Post Acute Care Choice: [place] Living arrangements for the past 2 months: [place]                                      Prior Living Arrangements/Services Living arrangements for the past 2 months: Single Family Home Lives with:: Spouse Patient language and need for interpreter reviewed:: Yes Do you feel safe going  back to the place where you live?: Yes      Need for Family Participation in Patient Care: Yes (Comment) Care giver support system in place?: Yes (comment)   Criminal Activity/Legal Involvement Pertinent to Current Situation/Hospitalization: No - Comment as needed  Activities of Daily Living   ADL Screening (condition at time of admission) Patient's cognitive ability adequate to safely complete daily activities?: No Is the patient deaf or have difficulty hearing?: Yes Does the patient have difficulty seeing, even when wearing glasses/contacts?: Yes Does the patient have difficulty concentrating, remembering, or making decisions?: Yes Patient able to express need for assistance with ADLs?: No Does the patient have difficulty dressing or bathing?: Yes Independently performs ADLs?: Yes (appropriate for developmental age) Does the patient have difficulty walking or climbing stairs?: Yes Weakness of Legs: Both Weakness of Arms/Hands: None  Permission Sought/Granted Permission sought to share information with : Family Supports    Share Information with NAME: [name]     Permission granted to share info w Relationship: son     Emotional Assessment Appearance:: Appears stated age Attitude/Demeanor/Rapport: Unable to Assess Affect (typically observed): Unable to Assess Orientation: : Oriented to Self Alcohol / Substance Use: Not Applicable Psych Involvement: No (comment)  Admission diagnosis:  SOB (shortness of breath) [R06.02] Hypoxia [R09.02] Atrial fibrillation with RVR ([place]) [I48.91] Sepsis ([place]) [A41.9]  Pneumonia due to COVID-19 virus [U07.1, J12.82] Patient Active Problem List   Diagnosis Date Noted  . Goals of care, counseling/discussion   . Palliative care by specialist   . DNR (do not resuscitate)   . Severe sepsis ([place]) 03/26/2020  . Pneumonia due to COVID-19 virus 03/26/2020  . Acute respiratory failure due to COVID-19 ([place]) 03/26/2020  . Acute hypernatremia  03/26/2020  . Rapid atrial fibrillation, new onset ([place]) 03/26/2020  . Acute metabolic encephalopathy 84/85/9276  . Bacterial pneumonia 03/26/2020  . Sepsis ([place]) 03/26/2020  . Atherosclerosis of lower extremity with claudication ([place]) 09/13/2017  . COPD exacerbation ([place]) 08/09/2017  . Hypokalemia 06/20/2016  . Syncope   . CAD (coronary artery disease) 06/19/2016  . Status post coronary artery stent placement   . SVT (supraventricular tachycardia) ([place])   . NSVT (nonsustained ventricular tachycardia) ([place])   . NSTEMI (non-ST elevated myocardial infarction) ([place])   . Parkinson's variant of multiple system atrophy ([place]) 12/19/2015  . Encephalopathy chronic 12/14/2015  . Chest pain   . COPD (chronic obstructive pulmonary disease) ([place])   . Heart disease   . Seizure disorder ([place])   . Depression   . Tardive dyskinesia   . Dysphagia   . Skin lesion 08/14/2015  . Shortness of breath 08/14/2015  . Essential hypertension 08/14/2015  . Hyperlipidemia 08/14/2015  . Essential tremor 08/14/2015   PCP:  Idelle Crouch, MD Pharmacy:   [facility], [place] [address] [address] Phone: [phone] Fax: [phone]     Social Determinants of Health (SDOH) Interventions    Readmission Risk Interventions No flowsheet data found.

## 2020-03-28 NOTE — Progress Notes (Signed)
Progress Note  Patient Name: [name] Date of Encounter: 03/28/2020  [facility] Cardiologist: Ena Dawley, MD   Subjective   The patient was not interviewed due to Covid status.  Telemetry was reviewed and revealed intermittent episodes of SVT.  Inpatient Medications    Scheduled Meds: . vitamin C  500 mg Oral Daily  . chlorhexidine  15 mL Mouth Rinse BID  . Chlorhexidine Gluconate Cloth  6 each Topical Q0600  . cyanocobalamin  1,000 mcg Intramuscular Daily   Followed by  . [START ON 04/03/2020] vitamin B-12  1,000 mcg Oral Daily  . dexamethasone (DECADRON) injection  6 mg Intravenous Q24H  . enoxaparin (LOVENOX) injection  40 mg Subcutaneous Q24H  . feeding supplement (NEPRO CARB STEADY)  237 mL Oral TID BM  . insulin aspart  0-5 Units Subcutaneous QHS  . insulin aspart  0-9 Units Subcutaneous TID WC  . linagliptin  5 mg Oral Daily  . mouth rinse  15 mL Mouth Rinse q12n4p  . metoprolol tartrate  25 mg Oral BID  . multivitamin with minerals  1 tablet Oral Daily  . mupirocin ointment  1 application Nasal BID  . thiamine injection  100 mg Intravenous Daily  . zinc sulfate  220 mg Oral Daily   Continuous Infusions: . sodium chloride Stopped (03/28/20 1506)  .  ceFAZolin (ANCEF) IV 200 mL/hr at 03/28/20 1507  . dextrose 5 % and 0.45% NaCl Stopped (03/28/20 1504)  . folic acid (FOLVITE) IVPB 100 mL/hr at 03/28/20 1507  . remdesivir 100 mg in NS 100 mL Stopped (03/28/20 1033)   PRN Meds: sodium chloride, acetaminophen **OR** acetaminophen, chlorpheniramine-HYDROcodone, guaiFENesin-dextromethorphan, levalbuterol, metoprolol tartrate, ondansetron **OR** ondansetron (ZOFRAN) IV   Vital Signs    Vitals:   03/27/20 2019 03/28/20 0304 03/28/20 0856 03/28/20 1243  BP: (!) 145/79 138/87 (!) 141/92 (!) 143/67  Pulse: 89 96 (!) 103 95  Resp: (!) 21 16    Temp: 97.9 F (36.6 C) (!) 97.5 F (36.4 C) 98.1 F (36.7 C) 97.9 F (36.6 C)  TempSrc: Oral Oral  Oral Oral  SpO2: 99% 100% 100% 97%  Weight:  62.5 kg    Height:        Intake/Output Summary (Last 24 hours) at 03/28/2020 1625 Last data filed at 03/28/2020 1507 Gross per 24 hour  Intake 2695.95 ml  Output 950 ml  Net 1745.95 ml   Last 3 Weights 03/28/2020 03/27/2020 03/26/2020  Weight (lbs) 137 lb 12.8 oz 137 lb 12.8 oz 133 lb 9.6 oz  Weight (kg) 62.506 kg 62.506 kg 60.601 kg  Some encounter information is confidential and restricted. Go to Review Flowsheets activity to see all data.      Telemetry    Intermittent SVT with frequent PACs.- Personally Reviewed  ECG      Physical Exam  Physical examination was not performed.   Labs    High Sensitivity Troponin:  No results for input(s): TROPONINIHS in the last 720 hours.    Chemistry Recent Labs  Lab 03/26/20 1030 03/27/20 0149 03/28/20 0245  NA 148* 148* 146*  K 4.3 3.6 3.6  CL 112* 113* 109  CO2 26 27 28   GLUCOSE 119* 88 127*  BUN 34* 28* 25*  CREATININE 0.70 0.68 0.59*  CALCIUM 7.7* 7.7* 7.6*  PROT 5.6* 5.3* 5.3*  ALBUMIN 2.5* 2.3* 2.5*  AST 28 26 27   ALT 10 11 13   ALKPHOS 49 47 42  BILITOT 1.4* 1.0 1.0  GFRNONAA >60 >60 >  [place] 10 8 9      Hematology Recent Labs  Lab 03/26/20 0507 03/27/20 0149 03/28/20 0245  WBC 8.0 11.0* 11.9*  RBC 4.84 4.47 4.53  HGB 14.5 13.8 13.5  HCT 43.7 40.9 40.9  MCV 90.3 91.5 90.3  MCH 30.0 30.9 29.8  MCHC 33.2 33.7 33.0  RDW 13.8 13.7 13.3  PLT 201 182 203    BNP Recent Labs  Lab 03/27/20 0149  BNP 566.2*     DDimer No results for input(s): DDIMER in the last 168 hours.   Radiology    US Venous Img Lower Bilateral (DVT)  Result Date: 03/27/2020 CLINICAL DATA:  Elevated D-dimer.  Evaluate for DVT. EXAM: BILATERAL LOWER EXTREMITY VENOUS DOPPLER ULTRASOUND TECHNIQUE: Gray-scale sonography with graded compression, as well as color Doppler and duplex ultrasound were performed to evaluate the lower extremity deep venous systems from the level of  the common femoral vein and including the common femoral, femoral, profunda femoral, popliteal and calf veins including the posterior tibial, peroneal and gastrocnemius veins when visible. The superficial great saphenous vein was also interrogated. Spectral Doppler was utilized to evaluate flow at rest and with distal augmentation maneuvers in the common femoral, femoral and popliteal veins. COMPARISON:  None. FINDINGS: RIGHT LOWER EXTREMITY Common Femoral Vein: No evidence of thrombus. Normal compressibility, respiratory phasicity and response to augmentation. Saphenofemoral Junction: No evidence of thrombus. Normal compressibility and flow on color Doppler imaging. Profunda Femoral Vein: No evidence of thrombus. Normal compressibility and flow on color Doppler imaging. Femoral Vein: No evidence of thrombus. Normal compressibility, respiratory phasicity and response to augmentation. Popliteal Vein: No evidence of thrombus. Normal compressibility, respiratory phasicity and response to augmentation. Calf Veins: No evidence of thrombus. Normal compressibility and flow on color Doppler imaging. Superficial Great Saphenous Vein: No evidence of thrombus. Normal compressibility. Venous Reflux:  None. Other Findings:  None. LEFT LOWER EXTREMITY Common Femoral Vein: No evidence of thrombus. Normal compressibility, respiratory phasicity and response to augmentation. Saphenofemoral Junction: No evidence of thrombus. Normal compressibility and flow on color Doppler imaging. Profunda Femoral Vein: No evidence of thrombus. Normal compressibility and flow on color Doppler imaging. Femoral Vein: No evidence of thrombus. Normal compressibility, respiratory phasicity and response to augmentation. Popliteal Vein: No evidence of thrombus. Normal compressibility, respiratory phasicity and response to augmentation. Calf Veins: No evidence of thrombus. Normal compressibility and flow on color Doppler imaging. Superficial Great Saphenous  Vein: No evidence of thrombus. Normal compressibility. Venous Reflux:  None. Other Findings:  None. IMPRESSION: No evidence of DVT within either lower extremity Electronically Signed   By: Sandi Mariscal M.D.   On: 03/27/2020 08:01   ECHOCARDIOGRAM COMPLETE  Result Date: 03/27/2020    ECHOCARDIOGRAM REPORT   Patient Name:   [name] [facility] Date of Exam: 03/27/2020 Medical Rec #:  [id number]             Height:       72.0 in Accession #:    [id number]            Weight:       137.8 lb Date of Birth:  09/30/1940             BSA:          1.819 m Patient Age:    79 years              BP:           116/75 mmHg Patient Gender: M  HR:           76 bpm. Exam Location:  [facility] Procedure: 2D Echo, Cardiac Doppler and Color Doppler Indications:     Abnormal ECG 794.31  History:         Patient has prior history of Echocardiogram examinations, most                  recent 05/12/2018. COPD; Risk Factors:Hypertension and Diabetes.  Sonographer:     Sherrie Sport RDCS (AE) Referring Phys:  [phone] A CALDWELL POWELL JR Diagnosing Phys: Ida Rogue MD  Sonographer Comments: Technically difficult study due to poor echo windows, no apical window and no parasternal window. Image acquisition challenging due to patient body habitus and Image acquisition challenging due to COPD. IMPRESSIONS  1. Left ventricular ejection fraction, by estimation, is 40 to 45%. The left ventricle has mildly decreased function. The left ventricle demonstrates global hypokinesis. Left ventricular diastolic parameters are indeterminate.  2. Right ventricular systolic function is normal. The right ventricular size is normal. Tricuspid regurgitation signal is inadequate for assessing PA pressure.  3. Ectopy appreciated. FINDINGS  Left Ventricle: Left ventricular ejection fraction, by estimation, is 40 to 45%. The left ventricle has mildly decreased function. The left ventricle demonstrates global hypokinesis. The left ventricular internal  cavity size was normal in size. There is  no left ventricular hypertrophy. Left ventricular diastolic parameters are indeterminate. Right Ventricle: The right ventricular size is normal. No increase in right ventricular wall thickness. Right ventricular systolic function is normal. Tricuspid regurgitation signal is inadequate for assessing PA pressure. Left Atrium: Left atrial size was normal in size. Right Atrium: Right atrial size was normal in size. Pericardium: There is no evidence of pericardial effusion. Mitral Valve: The mitral valve was not well visualized. No evidence of mitral valve regurgitation. No evidence of mitral valve stenosis. Tricuspid Valve: The tricuspid valve is not well visualized. Tricuspid valve regurgitation is not demonstrated. No evidence of tricuspid stenosis. Aortic Valve: The aortic valve was not well visualized. Aortic valve regurgitation is not visualized. No aortic stenosis is present. Pulmonic Valve: The pulmonic valve was not well visualized. Pulmonic valve regurgitation is not visualized. No evidence of pulmonic stenosis. Aorta: The aortic root is normal in size and structure. Venous: The inferior vena cava is normal in size with greater than 50% respiratory variability, suggesting right atrial pressure of 3 mmHg. IAS/Shunts: No atrial level shunt detected by color flow Doppler.  LEFT VENTRICLE PLAX 2D LVIDd:         4.30 cm LVIDs:         3.65 cm LV PW:         1.60 cm LV IVS:        1.20 cm  LEFT ATRIUM         Index LA diam:    4.70 cm 2.58 cm/m  PULMONIC VALVE PV Vmax:        0.87 m/s PV Peak grad:   3.0 mmHg RVOT Peak grad: 5 mmHg  Ida Rogue MD Electronically signed by Ida Rogue MD Signature Date/Time: 03/27/2020/4:43:00 PM    Final     Cardiac Studies   Echocardiogram done yesterday showed an EF of 40 to 45%  Patient Profile     79 y.o. male with a hx of CAD with NSTEMI in 01/2016 s/p PCI/DES to the OM1, SVT, NSVT, tardive dyskinesia, Parkinson's  disease, seizure disorder, DM, HTN, HLD, and COPD who is being seen today for intermittent tachycardia in  the setting of severe Covid pneumonia.  Assessment & Plan    1.  PSVT likely atrial tachycardia or multifocal atrial tachycardia.  Currently on metoprolol 25 mg twice daily.  Consider increasing the dose to 50 mg twice daily given intermittent tachycardia.  2.  Severe COVID-19 pneumonia: Continue supportive care.  3.  MSSA bacteremia: No clear vegetation on transthoracic echo.  TEE might be needed.  However, the patient does not seem to be medically stable for TEE at the present time.      For questions or updates, please contact [place] Please consult [email or web address] for contact info under        Signed, Kathlyn Sacramento, MD  03/28/2020, 4:25 PM

## 2020-03-28 NOTE — Progress Notes (Signed)
Assessed patient during morning medication pass. Patient able to tell me he is in the hospital but unsure of location. Responded to his name. Patient declined any food offers. Ate less than 10% of breakfast meal tray. Patient did take all medications crushed in applesauce and drank few sips of Nepro Carb Steady. Will continue to assess patient frequently. Patient's son [name] updated via phone about patient's health status. Current patient presentation compared to night shift report appears to remain consistent. Palliative care in room during my assessment. She informed me she will also call and update family today.

## 2020-03-28 NOTE — Progress Notes (Signed)
PROGRESS NOTE    [name]  PJA:[id number] DOB: [birth year] DOA: 03/18/2020 PCP: Idelle Crouch, MD   Chief Complaint  Patient presents with  . Shortness of Breath    Brief Narrative:  [name] is [name] 79 y.o. male with medical history significant for HTN, COPD, seizures disorder, Parkinson's, CAD, with history of COVID-19 exposure from his wife who is currently hospitalized for Covid, who is brought in by EMS with [name] several day history of shortness of breath and cough, and who has been lying in bed, with poor oral intake, arriving unkempt and soiled and disoriented.  Patient is unable to contribute to history due to altered mental status.  On arrival he was afebrile at 97.2, BP 172/103, heart rate 95 respirations 40/min with O2 sat 84% on room air improving to 100% on 3 L.  While in the ER he went into rapid Affie Gasner. fib with heart rate as high as 159, with O2 sat dropping as low as 80% improving to 99% with 4 L.  Blood work returned with WBC of 12,000 and lactic acid 7.4, improving to 4 following 2 L fluid bolus.  Hemoglobin 18.3 suspect secondary to hemoconcentration.  CMP showed sodium of 152, BUN 45, creatinine 1.21.  D-dimer 1824.  Bilirubin 2.5.  Covid positive.  Chest x-ray showed right lower lobe infiltrate.  Patient treated with sepsis fluid bolus, IV antibiotics and also started on Decadron.  Hospitalist consulted for admission.     Assessment & Plan:   Active Problems:   Essential hypertension   COPD (chronic obstructive pulmonary disease) (HCC)   Seizure disorder (HCC)   Parkinson's variant of multiple system atrophy ([place])   CAD (coronary artery disease)   Severe sepsis ([place])   Pneumonia due to COVID-19 virus   Acute respiratory failure due to COVID-19 [facility])   Acute hypernatremia   Rapid atrial fibrillation, new onset ([place])   Acute metabolic encephalopathy   Bacterial pneumonia   Sepsis ([place])   Goals of care, counseling/discussion   Palliative care by  specialist   DNR (do not resuscitate)  Goals of care: DNR per son and pt's wife.  Overall prognosis is poor.  Palliative care following.  Severe sepsis/septic shock secondary to covid 19 viral pneumonia  Acute hypoxic respiratory failure  Superimposed Bacteria Community Acquired Pneumonia  MSSA Bacteremia - Patient presented with altered mental status, cough and shortness of breath, O2 sat 88% on room air, tachycardia, increased work of breathing requiring 4 L O2, leukocytosis, lactic acid 7.  Afebrile and not hypotensive - CT chest with right lower lobe pneumonia - SLP eval for aspiration -  Dysphagia 1, nectar thick liquids -continue steroids and remdesivir.  Hold off on baricitinib due to bacteremia - 12/14 blood cx with staph aureus (pending sensitivities) - repeat cx from 12/16 pending - continue ancef per ID - ID c/s, appreciate recommendations - recommending ancef, echo (EF 40-45%, global hypokinesis, valves not well visualized), repeat blood cx -- maintain O2>88% -- lactate improved -- I/O, daily weights -- Prone as able, OOB, flutter, IS, therapy  COVID-19 Labs  Recent Labs    03/27/20 0149 03/28/20 0245 03/28/20 0444  FERRITIN  --  200  --   CRP 1.6*  --  2.5*    Lab Results  Component Value Date   SARSCOV2NAA POSITIVE ([place]) 04/11/2020   Lactic Acidosis: suspect this is 2/2 hypovolemia/dehydration/sepsis from above, pt still with hypernatremia, reports feeling thirsty, does not clinically appear overloaded on exam.  Follow with IVF.  Elevated BNP noted, though clinically not overloaded at this time (received lasix x1 overnight). - improved with IVF  Supraventricular Tachycardia - appreciate cardiology assistance -> paroxysmal atrial tachycardia vs MAT  - metop PO 25 mg BID - prn metop for sustained HR > 120 - follow with IVF -Cardiology consult, appreciate recs --follow echo - notable for decreased EF, appreciate cards recs  HFrEF: does not appear volume  overloaded. New onset, possibly 2/2 stress with covid infection and bacteremia.   Cardiology recs appreciated     Elevated D dimer: follow LE [place] (negative for DVT) and CT PE protocol (without PE)  Acute hypernatremia - improving with IVF, continue to monitor  Acute metabolic encephalopathy -Secondary to acute infection and sepsis - delirium precautions -- follow TSH (low follow free T4), vitamin b12 (pending), folate (low, supplement), ammonia (wnl)  Abnormal TFT: free T4 is elevated, TSH low (but not completely suppressed), ? Hyperthyroidism vs in setting of acute illness, will repeat labs and follow  Folate Deficiency  B12 Deficiency: supplement    Essential hypertension -prn metop, BP ok  - metoprolol 25 mg BID --med rec hasn't been completed yet (will try to follow with son)    COPD (chronic obstructive pulmonary disease) (HCC) -Albuterol inhaler scheduled and as needed as per treatment of Covid    Parkinson's variant of multiple system atrophy (HCC) -Chronic and stable    CAD (coronary artery disease) -.  Stable -Continue aspirin, metoprolol, rosuvastatin and nitroglycerin pending med rec  Med rec currently pending, follow with pharmacy (and family)  DVT prophylaxis: lovenox Code Status: full  Family Communication: none at bedside Disposition:   Status is: Inpatient  Remains inpatient appropriate because:Inpatient level of care appropriate due to severity of illness   Dispo: The patient is from: Home              Anticipated d/c is to: pending              Anticipated d/c date is: > 3 days              Patient currently is not medically stable to d/c.       Consultants:   none  Procedures:  none  Antimicrobials: Anti-infectives (From admission, onward)   Start     Dose/Rate Route Frequency Ordered Stop   03/28/20 1000  cefTRIAXone (ROCEPHIN) 2 g in sodium chloride 0.9 % 100 mL IVPB  Status:  Discontinued        2 g 200 mL/hr over 30 Minutes  Intravenous Every 24 hours 03/27/20 0827 03/27/20 1557   03/27/20 2000  ceFAZolin (ANCEF) IVPB 2g/100 mL premix        2 g 200 mL/hr over 30 Minutes Intravenous Every 8 hours 03/27/20 1559     03/27/20 1000  remdesivir 100 mg in sodium chloride 0.9 % 100 mL IVPB       "Followed by" Linked Group Details   100 mg 200 mL/hr over 30 Minutes Intravenous Daily 03/26/20 0146 03/31/20 0959   03/27/20 0930  cefTRIAXone (ROCEPHIN) 1 g in sodium chloride 0.9 % 100 mL IVPB        1 g 200 mL/hr over 30 Minutes Intravenous  Once 03/27/20 0830 03/27/20 1124   03/26/20 2300  azithromycin (ZITHROMAX) 500 mg in sodium chloride 0.9 % 250 mL IVPB  Status:  Discontinued        500 mg 250 mL/hr over 60 Minutes Intravenous Every 24 hours 03/26/20 0218  03/26/20 1448   03/26/20 2200  cefTRIAXone (ROCEPHIN) 1 g in sodium chloride 0.9 % 100 mL IVPB  Status:  Discontinued        1 g 200 mL/hr over 30 Minutes Intravenous Every 24 hours 03/26/20 2053 03/27/20 0826   03/26/20 2200  azithromycin (ZITHROMAX) 500 mg in sodium chloride 0.9 % 250 mL IVPB  Status:  Discontinued        500 mg 250 mL/hr over 60 Minutes Intravenous Every 24 hours 03/26/20 2053 03/27/20 1559   03/26/20 0300  cefTRIAXone (ROCEPHIN) 1 g in sodium chloride 0.9 % 100 mL IVPB  Status:  Discontinued        1 g 200 mL/hr over 30 Minutes Intravenous Every 24 hours 03/26/20 0218 03/26/20 1448   03/26/20 0145  remdesivir 200 mg in sodium chloride 0.9% 250 mL IVPB       "Followed by" Linked Group Details   200 mg 580 mL/hr over 30 Minutes Intravenous Once 03/26/20 0146 03/26/20 0556   03/21/2020 2245  levofloxacin (LEVAQUIN) IVPB 750 mg        750 mg 100 mL/hr over 90 Minutes Intravenous  Once 04/05/2020 2244 03/26/20 0039         Subjective: Sueellen Kayes&Ox1 today No new complaints  Objective: Vitals:   03/27/20 2019 03/28/20 0304 03/28/20 0856 03/28/20 1243  BP: (!) 145/79 138/87 (!) 141/92 (!) 143/67  Pulse: 89 96 (!) 103 95  Resp: (!) 21 16    Temp:  97.9 F (36.6 C) (!) 97.5 F (36.4 C) 98.1 F (36.7 C) 97.9 F (36.6 C)  TempSrc: Oral Oral Oral Oral  SpO2: 99% 100% 100% 97%  Weight:  62.5 kg    Height:        Intake/Output Summary (Last 24 hours) at 03/28/2020 1339 Last data filed at 03/28/2020 0757 Gross per 24 hour  Intake 1857.84 ml  Output 952 ml  Net 905.84 ml   Filed Weights   03/26/20 1715 03/27/20 0417 03/28/20 0304  Weight: 60.6 kg 62.5 kg 62.5 kg    Examination:  General: No acute distress. Frail, cachetic Cardiovascular: Heart sounds show Cherese Lozano regular rate, and rhythm.  Lungs: Clear to auscultation bilaterally Abdomen: Soft, nontender, nondistended  Neurological: Alert and oriented 1. Moves all extremities 4. Cranial nerves II through XII grossly intact. Skin: Warm and dry. No rashes or lesions. Extremities: No clubbing or cyanosis. No edema.   Data Reviewed: I have personally reviewed following labs and imaging studies  CBC: Recent Labs  Lab 03/17/2020 2158 03/26/20 0507 03/27/20 0149 03/28/20 0245  WBC 12.2* 8.0 11.0* 11.9*  NEUTROABS 10.1*  --  9.3* 10.1*  HGB 18.3* 14.5 13.8 13.5  HCT 55.2* 43.7 40.9 40.9  MCV 90.2 90.3 91.5 90.3  PLT 302 201 182 240    Basic Metabolic Panel: Recent Labs  Lab 04/01/2020 2158 03/26/20 1030 03/27/20 0149 03/27/20 0659 03/28/20 0245  NA 152* 148* 148*  --  146*  K 3.9 4.3 3.6  --  3.6  CL 106 112* 113*  --  109  CO2 26 26 27   --  28  GLUCOSE 129* 119* 88  --  127*  BUN 45* 34* 28*  --  25*  CREATININE 1.21 0.70 0.68  --  0.59*  CALCIUM 8.6* 7.7* 7.7*  --  7.6*  MG  --   --   --  2.0 2.1  PHOS  --   --   --   --  2.4*  GFR: Estimated Creatinine Clearance: 68.4 mL/min (Xianna Siverling) (by C-G formula based on SCr of 0.59 mg/dL (L)).  Liver Function Tests: Recent Labs  Lab 04/03/2020 2158 03/26/20 1030 03/27/20 0149 03/28/20 0245  AST 45* 28 26 27   ALT 16 10 11 13   ALKPHOS 61 49 47 42  BILITOT 2.5* 1.4* 1.0 1.0  PROT 7.7 5.6* 5.3* 5.3*  ALBUMIN 3.2*  2.5* 2.3* 2.5*    CBG: Recent Labs  Lab 03/27/20 1219 03/27/20 1745 03/27/20 2020 03/28/20 0911 03/28/20 1231  GLUCAP 105* 110* 107* 169* 98     Recent Results (from the past 240 hour(s))  Blood culture (routine single)     Status: Abnormal (Preliminary result)   Collection Time: 04/01/2020  9:58 PM   Specimen: BLOOD  Result Value Ref Range Status   Specimen Description   Final    BLOOD BLOOD RIGHT FOREARM Performed at [facility], [address] [place], [place] [address]    Special Requests   Final    BOTTLES DRAWN AEROBIC AND ANAEROBIC Blood Culture adequate volume Performed at [facility], [address] [place], [place] [address]    Culture  Setup Time   Final    GRAM POSITIVE COCCI ANAEROBIC BOTTLE ONLY Organism ID to follow CRITICAL RESULT CALLED TO, READ BACK BY AND VERIFIED WITHVioleta Gelinas AT 4580 03/27/20 SDR Performed at [facility], [address] [place], [place] [address]    Culture (Jakyiah Briones)  Final    STAPHYLOCOCCUS AUREUS SUSCEPTIBILITIES TO FOLLOW Performed at [facility], [place] [place], [place] [address]    Report Status PENDING  Incomplete  Resp Panel by RT-PCR (Flu Caroline Matters&B, Covid) Nasopharyngeal Swab     Status: Abnormal   Collection Time: 03/12/2020  9:58 PM   Specimen: Nasopharyngeal Swab; Nasopharyngeal(NP) swabs in vial transport medium  Result Value Ref Range Status   SARS Coronavirus 2 by RT PCR POSITIVE (Amillion Macchia) NEGATIVE Final    Comment: RESULT CALLED TO, READ BACK BY AND VERIFIED WITH: JEN AGNEW RN [id number] 03/28/2020 (NOTE) SARS-CoV-2 target nucleic acids are DETECTED.  The SARS-CoV-2 RNA is generally detectable in upper respiratory specimens during the acute phase of infection. Positive results are indicative of the presence of the identified virus, but do not rule out bacterial infection or co-infection with other pathogens not detected by the test. Clinical correlation with patient history  and other diagnostic information is necessary to determine patient infection status. The expected result is Negative.  Fact Sheet for Patients: EntrepreneurPulse.com.au  Fact Sheet for Healthcare Providers: IncredibleEmployment.be  This test is not yet approved or cleared by the [place] FDA and  has been authorized for detection and/or diagnosis of SARS-CoV-2 by FDA under an Emergency Use Authorization (EUA).  This EUA will remain in effect (meaning this test can be used)  for the duration of  the COVID-19 declaration under Section 564(b)(1) of the Act, 21 U.S.C. section 360bbb-3(b)(1), unless the authorization is terminated or revoked sooner.     Influenza Geneieve Duell by PCR NEGATIVE NEGATIVE Final   Influenza B by PCR NEGATIVE NEGATIVE Final    Comment: (NOTE) The Xpert Xpress SARS-CoV-2/FLU/RSV plus assay is intended as an aid in the diagnosis of influenza from Nasopharyngeal swab specimens and should not be used as Tyshay Adee sole basis for treatment. Nasal washings and aspirates are unacceptable for Xpert Xpress SARS-CoV-2/FLU/RSV testing.  Fact Sheet for Patients: EntrepreneurPulse.com.au  Fact Sheet for Healthcare Providers: IncredibleEmployment.be  This test is not yet approved  or cleared by the [place] and has been authorized for detection and/or diagnosis of SARS-CoV-2 by FDA under an Emergency Use Authorization (EUA). This EUA will remain in effect (meaning this test can be used) for the duration of the COVID-19 declaration under Section 564(b)(1) of the Act, 21 U.S.C. section 360bbb-3(b)(1), unless the authorization is terminated or revoked.  Performed at [facility], [address] [place], [place] [address]   Blood Culture ID Panel (Reflexed)     Status: Abnormal   Collection Time: 03/17/2020  9:58 PM  Result Value Ref Range Status   Enterococcus faecalis NOT DETECTED NOT  DETECTED Final   Enterococcus Faecium NOT DETECTED NOT DETECTED Final   Listeria monocytogenes NOT DETECTED NOT DETECTED Final   Staphylococcus species DETECTED ([place]) NOT DETECTED Final    Comment: CRITICAL RESULT CALLED TO, READ BACK BY AND VERIFIED WITH:  JASON ROBBINS AT 0509 03/27/20 SDR    Staphylococcus aureus (BCID) DETECTED (Japneet Staggs) NOT DETECTED Final    Comment: CRITICAL RESULT CALLED TO, READ BACK BY AND VERIFIED WITH:  JASON ROBBINS AT 0509 03/27/20 SDR    Staphylococcus epidermidis NOT DETECTED NOT DETECTED Final   Staphylococcus lugdunensis NOT DETECTED NOT DETECTED Final   Streptococcus species NOT DETECTED NOT DETECTED Final   Streptococcus agalactiae NOT DETECTED NOT DETECTED Final   Streptococcus pneumoniae NOT DETECTED NOT DETECTED Final   Streptococcus pyogenes NOT DETECTED NOT DETECTED Final   Haruye Lainez.calcoaceticus-baumannii NOT DETECTED NOT DETECTED Final   Bacteroides fragilis NOT DETECTED NOT DETECTED Final   Enterobacterales NOT DETECTED NOT DETECTED Final   Enterobacter cloacae complex NOT DETECTED NOT DETECTED Final   Escherichia coli NOT DETECTED NOT DETECTED Final   Klebsiella aerogenes NOT DETECTED NOT DETECTED Final   Klebsiella oxytoca NOT DETECTED NOT DETECTED Final   Klebsiella pneumoniae NOT DETECTED NOT DETECTED Final   Proteus species NOT DETECTED NOT DETECTED Final   Salmonella species NOT DETECTED NOT DETECTED Final   Serratia marcescens NOT DETECTED NOT DETECTED Final   Haemophilus influenzae NOT DETECTED NOT DETECTED Final   Neisseria meningitidis NOT DETECTED NOT DETECTED Final   Pseudomonas aeruginosa NOT DETECTED NOT DETECTED Final   Stenotrophomonas maltophilia NOT DETECTED NOT DETECTED Final   Candida albicans NOT DETECTED NOT DETECTED Final   Candida auris NOT DETECTED NOT DETECTED Final   Candida glabrata NOT DETECTED NOT DETECTED Final   Candida krusei NOT DETECTED NOT DETECTED Final   Candida parapsilosis NOT DETECTED NOT DETECTED Final    Candida tropicalis NOT DETECTED NOT DETECTED Final   Cryptococcus neoformans/gattii NOT DETECTED NOT DETECTED Final   Meth resistant mecA/C and MREJ NOT DETECTED NOT DETECTED Final    Comment: Performed at [facility], [address] [place], [place] [address]  MRSA PCR Screening     Status: Abnormal   Collection Time: 03/27/20 12:00 PM   Specimen: Nasopharyngeal  Result Value Ref Range Status   MRSA by PCR POSITIVE (Stormie Ventola) NEGATIVE Final    Comment:        The GeneXpert MRSA Assay (FDA approved for NASAL specimens only), is one component of Esteban Kobashigawa comprehensive MRSA colonization surveillance program. It is not intended to diagnose MRSA infection nor to guide or monitor treatment for MRSA infections. RESULT CALLED TO, READ BACK BY AND VERIFIED WITHMaryland Pink AT 3536 03/27/20 SDR Performed at [facility], [address], [place], [place] [address]          Radiology Studies: CT ANGIO CHEST PE W  OR WO CONTRAST  Result Date: 03/26/2020 CLINICAL DATA:  79 year old male with concern for pulmonary embolism. EXAM: CT ANGIOGRAPHY CHEST WITH CONTRAST TECHNIQUE: Multidetector CT imaging of the chest was performed using the standard protocol during bolus administration of intravenous contrast. Multiplanar CT image reconstructions and MIPs were obtained to evaluate the vascular anatomy. CONTRAST:  44mL OMNIPAQUE IOHEXOL 350 MG/ML SOLN COMPARISON:  CT dated 08/09/2017. FINDINGS: Cardiovascular: Top-normal cardiac size. No pericardial effusion. There is 3 vessel coronary vascular calcification. Moderate atherosclerotic calcification of the thoracic aorta. No aneurysmal dilatation or dissection. Evaluation of the pulmonary arteries is limited due to respiratory motion artifact. No pulmonary artery embolus identified. Mediastinum/Nodes: Top-normal right hilar lymph nodes, likely reactive. No mediastinal adenopathy. The esophagus is grossly unremarkable. Enlargement of the left  thyroid lobe similar to studies dating back to 2015. No mediastinal fluid collection. Lungs/Pleura: Background of moderate to severe emphysema. There is Truman Aceituno patchy area of airspace opacity involving the right lower lobe and right infrahilar region most consistent with pneumonia. Clinical correlation and follow-up to resolution recommended. There is no pleural effusion pneumothorax. The central airways remain patent. There is however partial occlusion of the right lower lobe bronchus and therefore aspiration or and occlusive lesion is not entirely excluded. Upper Abdomen: No acute abnormality. Musculoskeletal: Osteopenia with degenerative changes of the spine. No acute osseous pathology. Review of the MIP images confirms the above findings. IMPRESSION: 1. No CT evidence of pulmonary embolism. 2. Right lower lobe pneumonia. Aspiration is not excluded. Clinical correlation and follow-up to resolution recommended. 3. Aortic Atherosclerosis (ICD10-I70.0) and Emphysema (ICD10-J43.9). Electronically Signed   By: Anner Crete M.D.   On: 03/26/2020 15:54   US Venous Img Lower Bilateral (DVT)  Result Date: 03/27/2020 CLINICAL DATA:  Elevated D-dimer.  Evaluate for DVT. EXAM: BILATERAL LOWER EXTREMITY VENOUS DOPPLER ULTRASOUND TECHNIQUE: Gray-scale sonography with graded compression, as well as color Doppler and duplex ultrasound were performed to evaluate the lower extremity deep venous systems from the level of the common femoral vein and including the common femoral, femoral, profunda femoral, popliteal and calf veins including the posterior tibial, peroneal and gastrocnemius veins when visible. The superficial great saphenous vein was also interrogated. Spectral Doppler was utilized to evaluate flow at rest and with distal augmentation maneuvers in the common femoral, femoral and popliteal veins. COMPARISON:  None. FINDINGS: RIGHT LOWER EXTREMITY Common Femoral Vein: No evidence of thrombus. Normal compressibility,  respiratory phasicity and response to augmentation. Saphenofemoral Junction: No evidence of thrombus. Normal compressibility and flow on color Doppler imaging. Profunda Femoral Vein: No evidence of thrombus. Normal compressibility and flow on color Doppler imaging. Femoral Vein: No evidence of thrombus. Normal compressibility, respiratory phasicity and response to augmentation. Popliteal Vein: No evidence of thrombus. Normal compressibility, respiratory phasicity and response to augmentation. Calf Veins: No evidence of thrombus. Normal compressibility and flow on color Doppler imaging. Superficial Great Saphenous Vein: No evidence of thrombus. Normal compressibility. Venous Reflux:  None. Other Findings:  None. LEFT LOWER EXTREMITY Common Femoral Vein: No evidence of thrombus. Normal compressibility, respiratory phasicity and response to augmentation. Saphenofemoral Junction: No evidence of thrombus. Normal compressibility and flow on color Doppler imaging. Profunda Femoral Vein: No evidence of thrombus. Normal compressibility and flow on color Doppler imaging. Femoral Vein: No evidence of thrombus. Normal compressibility, respiratory phasicity and response to augmentation. Popliteal Vein: No evidence of thrombus. Normal compressibility, respiratory phasicity and response to augmentation. Calf Veins: No evidence of thrombus. Normal compressibility and flow on color Doppler imaging. Superficial Great  Saphenous Vein: No evidence of thrombus. Normal compressibility. Venous Reflux:  None. Other Findings:  None. IMPRESSION: No evidence of DVT within either lower extremity Electronically Signed   By: Sandi Mariscal M.D.   On: 03/27/2020 08:01   ECHOCARDIOGRAM COMPLETE  Result Date: 03/27/2020    ECHOCARDIOGRAM REPORT   Patient Name:   Anh Jerral [facility] Date of Exam: 03/27/2020 Medical Rec #:  [id number]             Height:       72.0 in Accession #:    [id number]            Weight:       137.8 lb Date of Birth:   12-17-1940             BSA:          1.819 m Patient Age:    13 years              BP:           116/75 mmHg Patient Gender: M                     HR:           76 bpm. Exam Location:  [facility] Procedure: 2D Echo, Cardiac Doppler and Color Doppler Indications:     Abnormal ECG 794.31  History:         Patient has prior history of Echocardiogram examinations, most                  recent 05/12/2018. COPD; Risk Factors:Hypertension and Diabetes.  Sonographer:     Sherrie Sport RDCS (AE) Referring Phys:  [phone] Monasia Lair CALDWELL POWELL JR Diagnosing Phys: Ida Rogue MD  Sonographer Comments: Technically difficult study due to poor echo windows, no apical window and no parasternal window. Image acquisition challenging due to patient body habitus and Image acquisition challenging due to COPD. IMPRESSIONS  1. Left ventricular ejection fraction, by estimation, is 40 to 45%. The left ventricle has mildly decreased function. The left ventricle demonstrates global hypokinesis. Left ventricular diastolic parameters are indeterminate.  2. Right ventricular systolic function is normal. The right ventricular size is normal. Tricuspid regurgitation signal is inadequate for assessing PA pressure.  3. Ectopy appreciated. FINDINGS  Left Ventricle: Left ventricular ejection fraction, by estimation, is 40 to 45%. The left ventricle has mildly decreased function. The left ventricle demonstrates global hypokinesis. The left ventricular internal cavity size was normal in size. There is  no left ventricular hypertrophy. Left ventricular diastolic parameters are indeterminate. Right Ventricle: The right ventricular size is normal. No increase in right ventricular wall thickness. Right ventricular systolic function is normal. Tricuspid regurgitation signal is inadequate for assessing PA pressure. Left Atrium: Left atrial size was normal in size. Right Atrium: Right atrial size was normal in size. Pericardium: There is no evidence of pericardial  effusion. Mitral Valve: The mitral valve was not well visualized. No evidence of mitral valve regurgitation. No evidence of mitral valve stenosis. Tricuspid Valve: The tricuspid valve is not well visualized. Tricuspid valve regurgitation is not demonstrated. No evidence of tricuspid stenosis. Aortic Valve: The aortic valve was not well visualized. Aortic valve regurgitation is not visualized. No aortic stenosis is present. Pulmonic Valve: The pulmonic valve was not well visualized. Pulmonic valve regurgitation is not visualized. No evidence of pulmonic stenosis. Aorta: The aortic root is normal in size and structure. Venous: The inferior vena cava is normal in size  with greater than 50% respiratory variability, suggesting right atrial pressure of 3 mmHg. IAS/Shunts: No atrial level shunt detected by color flow Doppler.  LEFT VENTRICLE PLAX 2D LVIDd:         4.30 cm LVIDs:         3.65 cm LV PW:         1.60 cm LV IVS:        1.20 cm  LEFT ATRIUM         Index LA diam:    4.70 cm 2.58 cm/m  PULMONIC VALVE PV Vmax:        0.87 m/s PV Peak grad:   3.0 mmHg RVOT Peak grad: 5 mmHg  Ida Rogue MD Electronically signed by Ida Rogue MD Signature Date/Time: 03/27/2020/4:43:00 PM    Final         Scheduled Meds: . vitamin C  500 mg Oral Daily  . chlorhexidine  15 mL Mouth Rinse BID  . Chlorhexidine Gluconate Cloth  6 each Topical Q0600  . cyanocobalamin  1,000 mcg Intramuscular Daily   Followed by  . [START ON 04/03/2020] vitamin B-12  1,000 mcg Oral Daily  . dexamethasone (DECADRON) injection  6 mg Intravenous Q24H  . enoxaparin (LOVENOX) injection  40 mg Subcutaneous Q24H  . feeding supplement (NEPRO CARB STEADY)  237 mL Oral TID BM  . insulin aspart  0-5 Units Subcutaneous QHS  . insulin aspart  0-9 Units Subcutaneous TID WC  . linagliptin  5 mg Oral Daily  . mouth rinse  15 mL Mouth Rinse q12n4p  . metoprolol tartrate  25 mg Oral BID  . multivitamin with minerals  1 tablet Oral Daily  .  mupirocin ointment  1 application Nasal BID  . thiamine injection  100 mg Intravenous Daily  . zinc sulfate  220 mg Oral Daily   Continuous Infusions: . sodium chloride 500 mL (03/28/20 1002)  .  ceFAZolin (ANCEF) IV Stopped (03/28/20 0533)  . dextrose 5 % and 0.45% NaCl 100 mL/hr at 03/28/20 1000  . folic acid (FOLVITE) IVPB 1 mg (03/27/20 1848)  . remdesivir 100 mg in NS 100 mL 100 mg (03/28/20 1003)     LOS: 2 days    Time spent: over 58 min    Fayrene Helper, MD [facility]   To contact the attending provider between 7A-7P or the covering provider during after hours 7P-7A, please log into the web site [email or web address] and access using universal [place] password for that web site. If you do not have the password, please call the hospital operator.  03/28/2020, 1:39 PM

## 2020-03-28 NOTE — Progress Notes (Signed)
Daily Progress Note   Patient Name: [name]       Date: 03/28/2020 DOB: 10-27-1940  Age: 79 y.o. MRN#: [id number] Attending Physician: Elodia Florence., * Primary Care Physician: Idelle Crouch, MD Admit Date: 03/14/2020  Reason for Consultation/Follow-up: Establishing goals of care  Subjective: Reports he feels bad but denies pain/shortness of breath. Reports no appetite - refuses food and drink offered at my time at bedside  Length of Stay: 2  Current Medications: Scheduled Meds:  . vitamin C  500 mg Oral Daily  . chlorhexidine  15 mL Mouth Rinse BID  . Chlorhexidine Gluconate Cloth  6 each Topical Q0600  . cyanocobalamin  1,000 mcg Intramuscular Daily   Followed by  . [START ON 04/03/2020] vitamin B-12  1,000 mcg Oral Daily  . dexamethasone (DECADRON) injection  6 mg Intravenous Q24H  . enoxaparin (LOVENOX) injection  40 mg Subcutaneous Q24H  . feeding supplement (NEPRO CARB STEADY)  237 mL Oral TID BM  . insulin aspart  0-5 Units Subcutaneous QHS  . insulin aspart  0-9 Units Subcutaneous TID WC  . linagliptin  5 mg Oral Daily  . mouth rinse  15 mL Mouth Rinse q12n4p  . metoprolol tartrate  25 mg Oral BID  . multivitamin with minerals  1 tablet Oral Daily  . mupirocin ointment  1 application Nasal BID  . thiamine injection  100 mg Intravenous Daily  . zinc sulfate  220 mg Oral Daily    Continuous Infusions: . sodium chloride 500 mL (03/28/20 1002)  .  ceFAZolin (ANCEF) IV Stopped (03/28/20 0533)  . dextrose 5 % and 0.45% NaCl 100 mL/hr at 03/28/20 1000  . folic acid (FOLVITE) IVPB 1 mg (03/27/20 1848)  . remdesivir 100 mg in NS 100 mL 100 mg (03/28/20 1003)    PRN Meds: sodium chloride, acetaminophen **OR** acetaminophen, chlorpheniramine-HYDROcodone,  guaiFENesin-dextromethorphan, levalbuterol, metoprolol tartrate, ondansetron **OR** ondansetron (ZOFRAN) IV  Physical Exam Constitutional:      Appearance: He is ill-appearing.  Pulmonary:     Comments: Mild tachypnea noted with mild acc muscle use Skin:    General: Skin is warm and dry.  Neurological:     Mental Status: He is alert.     Motor: Weakness present.     Comments: Periods of confusion             Vital Signs: BP (!) (P) 141/92 (BP Location: Right Arm)   Pulse (!) (P) 103   Temp (P) 98.1 F (36.7 C) (Oral)   Resp 16   Ht 6' (1.829 m)   Wt 62.5 kg   SpO2 (P) 100%   BMI 18.69 kg/m  SpO2: SpO2: (P) 100 % O2 Device: O2 Device: (P) Nasal Cannula O2 Flow Rate: O2 Flow Rate (L/min): 5 L/min  Intake/output summary:   Intake/Output Summary (Last 24 hours) at 03/28/2020 1020 Last data filed at 03/28/2020 0757 Gross per 24 hour  Intake 3085.28 ml  Output 952 ml  Net 2133.28 ml   LBM: Last BM Date: 03/27/20 Baseline Weight: Weight: 59 kg Most recent weight: Weight: 62.5 kg       Palliative Assessment/Data: PPS 20%    Flowsheet Rows  Flowsheet Row Most Recent Value  Intake Tab   Referral Department Hospitalist  Unit at Time of Referral Intermediate Care Unit  Palliative Care Primary Diagnosis Sepsis/Infectious Disease  Date Notified 03/26/20  Palliative Care Type New Palliative care  Reason for referral Clarify Goals of Care  Date of Admission 04/03/2020  Date first seen by Palliative Care 03/27/20  # of days Palliative referral response time 1 Day(s)  # of days IP prior to Palliative referral 1  Clinical Assessment   Palliative Performance Scale Score 20%  Psychosocial & Spiritual Assessment   Palliative Care Outcomes   Patient/Family meeting held? Yes  Who was at the meeting? son  Palliative Care Outcomes Clarified goals of care, Provided psychosocial or spiritual support      Patient Active Problem List   Diagnosis Date Noted  . Goals of care,  counseling/discussion   . Palliative care by specialist   . DNR (do not resuscitate)   . Severe sepsis ([place]) 03/26/2020  . Pneumonia due to COVID-19 virus 03/26/2020  . Acute respiratory failure due to COVID-19 ([place]) 03/26/2020  . Acute hypernatremia 03/26/2020  . Rapid atrial fibrillation, new onset ([place]) 03/26/2020  . Acute metabolic encephalopathy 60/63/0160  . Bacterial pneumonia 03/26/2020  . Sepsis ([place]) 03/26/2020  . Atherosclerosis of lower extremity with claudication ([place]) 09/13/2017  . COPD exacerbation ([place]) 08/09/2017  . Hypokalemia 06/20/2016  . Syncope   . CAD (coronary artery disease) 06/19/2016  . Status post coronary artery stent placement   . SVT (supraventricular tachycardia) ([place])   . NSVT (nonsustained ventricular tachycardia) ([place])   . NSTEMI (non-ST elevated myocardial infarction) ([place])   . Parkinson's variant of multiple system atrophy ([place]) 12/19/2015  . Encephalopathy chronic 12/14/2015  . Chest pain   . COPD (chronic obstructive pulmonary disease) ([place])   . Heart disease   . Seizure disorder ([place])   . Depression   . Tardive dyskinesia   . Dysphagia   . Skin lesion 08/14/2015  . Shortness of breath 08/14/2015  . Essential hypertension 08/14/2015  . Hyperlipidemia 08/14/2015  . Essential tremor 08/14/2015    Palliative Care Assessment & Plan   HPI: 79 y.o. male  with past medical history of HTN, COPD, seizures disorder, Parkinson's, and CAD admitted on 03/14/2020 with shortness of breath and cough. Covid positive. Chest x-ray showed right lower lobe infiltrate. Patient being treated for severe septic shock, superimposed bacterial CAP, and MSSA bacteremia. Patient also had an aspiration event and has been evaluated by SLP. Continues to have runs of SVT. Patient with lethargy, confusion, and poor PO intake. PMT consulted to discuss [place].  Assessment: No significant changes overnight.   Attempted to call Katharine Look (wife) to provide update and verify son  as Media planner - no answer. Left voice message with call back number.   Spoke with son [name] - provided update. Discussed plan to continue with current interventions and see how patient responds. Discussed transition to comfort measures only if patient declines - [name] agrees this is appropriate. [name] asks about intubation and we discussed this further - he shares he does not think patient would want this. We confirmed DNR/DNI.   Per RN overnight patient stated he wanted to go to sleep and not wake up.   [name] expresses that if patient survives hospitalization he will need placement as he cannot return home d/t unsafe environment/lack of caregiver.   Recommendations/Plan:  Continue current measures with DNR/DNI in place - if significant decline son agrees to transition to  comfort measures only  If patient d/c's to SNF - recommend consult to outpatient palliative for follow up d/t chronic health conditions and frail baseline status  Son has been serving as Media planner as we have been unable to get in touch with wife and son shares she agrees with him being decision maker/decisions for DNR/DNI  Will ask PMT member to follow up next week  Code Status:  DNR  Prognosis:   Unable to determine - at risk for acute decompensation, currently unstable with frail baseline  Discharge Planning:  To Be Determined - if d/c to SNF rec palliative consult outpatient  Care plan was discussed with RN, son  Thank you for allowing the Palliative Medicine Team to assist in the care of this patient.   Total Time 30 minutes Prolonged Time Billed  no       Greater than 50%  of this time was spent counseling and coordinating care related to the above assessment and plan.  Juel Burrow, DNP, [facility] Palliative Medicine Team Team Phone # [phone]  Pager [phone]

## 2020-03-28 NOTE — Plan of Care (Signed)
Pt confused and hard to understand. PT on 5L [place]. VSS. Will continue to monitor. Problem: Education: Goal: Knowledge of General Education information will improve Description: Including pain rating scale, medication(s)/side effects and non-pharmacologic comfort measures Outcome: Progressing   Problem: Health Behavior/Discharge Planning: Goal: Ability to manage health-related needs will improve Outcome: Progressing   Problem: Clinical Measurements: Goal: Ability to maintain clinical measurements within normal limits will improve Outcome: Progressing Goal: Will remain free from infection Outcome: Progressing Goal: Diagnostic test results will improve Outcome: Progressing Goal: Respiratory complications will improve Outcome: Progressing Goal: Cardiovascular complication will be avoided Outcome: Progressing   Problem: Activity: Goal: Risk for activity intolerance will decrease Outcome: Progressing   Problem: Nutrition: Goal: Adequate nutrition will be maintained Outcome: Progressing   Problem: Coping: Goal: Level of anxiety will decrease Outcome: Progressing   Problem: Elimination: Goal: Will not experience complications related to bowel motility Outcome: Progressing Goal: Will not experience complications related to urinary retention Outcome: Progressing   Problem: Pain Managment: Goal: General experience of comfort will improve Outcome: Progressing   Problem: Safety: Goal: Ability to remain free from injury will improve Outcome: Progressing   Problem: Skin Integrity: Goal: Risk for impaired skin integrity will decrease Outcome: Progressing

## 2020-03-28 NOTE — Progress Notes (Signed)
Date of Admission:  03/27/2020      ID: [name] is a 79 y.o. maleActive Problems:   Essential hypertension   COPD (chronic obstructive pulmonary disease) (HCC)   Seizure disorder (HCC)   Parkinson's variant of multiple system atrophy ([place])   CAD (coronary artery disease)   Severe sepsis ([place])   Pneumonia due to COVID-19 virus   Acute respiratory failure due to COVID-19 [facility])   Acute hypernatremia   Rapid atrial fibrillation, new onset ([place])   Acute metabolic encephalopathy   Bacterial pneumonia   Sepsis ([place])   Goals of care, counseling/discussion   Palliative care by specialist   DNR (do not resuscitate)    Subjective: Unable to give me any history He shakes his head when I call his name and smiles confused  Medications:  . vitamin C  500 mg Oral Daily  . chlorhexidine  15 mL Mouth Rinse BID  . Chlorhexidine Gluconate Cloth  6 each Topical Q0600  . cyanocobalamin  1,000 mcg Intramuscular Daily   Followed by  . [START ON 04/03/2020] vitamin B-12  1,000 mcg Oral Daily  . dexamethasone (DECADRON) injection  6 mg Intravenous Q24H  . enoxaparin (LOVENOX) injection  40 mg Subcutaneous Q24H  . feeding supplement (NEPRO CARB STEADY)  237 mL Oral TID BM  . insulin aspart  0-5 Units Subcutaneous QHS  . insulin aspart  0-9 Units Subcutaneous TID WC  . linagliptin  5 mg Oral Daily  . mouth rinse  15 mL Mouth Rinse q12n4p  . metoprolol tartrate  25 mg Oral BID  . multivitamin with minerals  1 tablet Oral Daily  . mupirocin ointment  1 application Nasal BID  . thiamine injection  100 mg Intravenous Daily  . zinc sulfate  220 mg Oral Daily    Objective: Vital signs in last 24 hours: Temp:  [97.5 F (36.4 C)-98.6 F (37 C)] (P) 98.1 F (36.7 C) (12/17 0856) Pulse Rate:  [89-103] (P) 103 (12/17 0856) Resp:  [16-21] 16 (12/17 0304) BP: (123-145)/(79-87) (P) 141/92 (12/17 0856) SpO2:  [98 %-100 %] (P) 100 % (12/17 0856) Weight:  [62.5 kg] 62.5 kg (12/17  0304)  PHYSICAL EXAM:  General: Awake, oriented to self.  Emaciated Chest rhonchi b/l [id number] Abdomen: Soft, non-tender,not distended. Bowel sounds normal. No masses Extremities: atraumatic, no cyanosis. No edema. No clubbing Skin: No rashes or lesions. Or bruising Lymph: Cervical, supraclavicular normal. Neurologic: Grossly non-focal  Lab Results Recent Labs    03/27/20 0149 03/28/20 0245  WBC 11.0* 11.9*  HGB 13.8 13.5  HCT 40.9 40.9  NA 148* 146*  K 3.6 3.6  CL 113* 109  CO2 27 28  BUN 28* 25*  CREATININE 0.68 0.59*   Liver Panel Recent Labs    03/27/20 0149 03/28/20 0245  PROT 5.3* 5.3*  ALBUMIN 2.3* 2.5*  AST 26 27  ALT 11 13  ALKPHOS 47 42  BILITOT 1.0 1.0   Sedimentation Rate No results for input(s): ESRSEDRATE in the last 72 hours. C-Reactive Protein Recent Labs    03/27/20 0149 03/28/20 0444  CRP 1.6* 2.5*    Microbiology:  Studies/Results: CT ANGIO CHEST PE W OR WO CONTRAST  Result Date: 03/26/2020 CLINICAL DATA:  79 year old male with concern for pulmonary embolism. EXAM: CT ANGIOGRAPHY CHEST WITH CONTRAST TECHNIQUE: Multidetector CT imaging of the chest was performed using the standard protocol during bolus administration of intravenous contrast. Multiplanar CT image reconstructions and MIPs were obtained to evaluate the vascular anatomy.  CONTRAST:  17mL OMNIPAQUE IOHEXOL 350 MG/ML SOLN COMPARISON:  CT dated 08/09/2017. FINDINGS: Cardiovascular: Top-normal cardiac size. No pericardial effusion. There is 3 vessel coronary vascular calcification. Moderate atherosclerotic calcification of the thoracic aorta. No aneurysmal dilatation or dissection. Evaluation of the pulmonary arteries is limited due to respiratory motion artifact. No pulmonary artery embolus identified. Mediastinum/Nodes: Top-normal right hilar lymph nodes, likely reactive. No mediastinal adenopathy. The esophagus is grossly unremarkable. Enlargement of the left thyroid lobe similar to  studies dating back to 2015. No mediastinal fluid collection. Lungs/Pleura: Background of moderate to severe emphysema. There is a patchy area of airspace opacity involving the right lower lobe and right infrahilar region most consistent with pneumonia. Clinical correlation and follow-up to resolution recommended. There is no pleural effusion pneumothorax. The central airways remain patent. There is however partial occlusion of the right lower lobe bronchus and therefore aspiration or and occlusive lesion is not entirely excluded. Upper Abdomen: No acute abnormality. Musculoskeletal: Osteopenia with degenerative changes of the spine. No acute osseous pathology. Review of the MIP images confirms the above findings. IMPRESSION: 1. No CT evidence of pulmonary embolism. 2. Right lower lobe pneumonia. Aspiration is not excluded. Clinical correlation and follow-up to resolution recommended. 3. Aortic Atherosclerosis (ICD10-I70.0) and Emphysema (ICD10-J43.9). Electronically Signed   By: Anner Crete M.D.   On: 03/26/2020 15:54   US Venous Img Lower Bilateral (DVT)  Result Date: 03/27/2020 CLINICAL DATA:  Elevated D-dimer.  Evaluate for DVT. EXAM: BILATERAL LOWER EXTREMITY VENOUS DOPPLER ULTRASOUND TECHNIQUE: Gray-scale sonography with graded compression, as well as color Doppler and duplex ultrasound were performed to evaluate the lower extremity deep venous systems from the level of the common femoral vein and including the common femoral, femoral, profunda femoral, popliteal and calf veins including the posterior tibial, peroneal and gastrocnemius veins when visible. The superficial great saphenous vein was also interrogated. Spectral Doppler was utilized to evaluate flow at rest and with distal augmentation maneuvers in the common femoral, femoral and popliteal veins. COMPARISON:  None. FINDINGS: RIGHT LOWER EXTREMITY Common Femoral Vein: No evidence of thrombus. Normal compressibility, respiratory phasicity  and response to augmentation. Saphenofemoral Junction: No evidence of thrombus. Normal compressibility and flow on color Doppler imaging. Profunda Femoral Vein: No evidence of thrombus. Normal compressibility and flow on color Doppler imaging. Femoral Vein: No evidence of thrombus. Normal compressibility, respiratory phasicity and response to augmentation. Popliteal Vein: No evidence of thrombus. Normal compressibility, respiratory phasicity and response to augmentation. Calf Veins: No evidence of thrombus. Normal compressibility and flow on color Doppler imaging. Superficial Great Saphenous Vein: No evidence of thrombus. Normal compressibility. Venous Reflux:  None. Other Findings:  None. LEFT LOWER EXTREMITY Common Femoral Vein: No evidence of thrombus. Normal compressibility, respiratory phasicity and response to augmentation. Saphenofemoral Junction: No evidence of thrombus. Normal compressibility and flow on color Doppler imaging. Profunda Femoral Vein: No evidence of thrombus. Normal compressibility and flow on color Doppler imaging. Femoral Vein: No evidence of thrombus. Normal compressibility, respiratory phasicity and response to augmentation. Popliteal Vein: No evidence of thrombus. Normal compressibility, respiratory phasicity and response to augmentation. Calf Veins: No evidence of thrombus. Normal compressibility and flow on color Doppler imaging. Superficial Great Saphenous Vein: No evidence of thrombus. Normal compressibility. Venous Reflux:  None. Other Findings:  None. IMPRESSION: No evidence of DVT within either lower extremity Electronically Signed   By: Sandi Mariscal M.D.   On: 03/27/2020 08:01   ECHOCARDIOGRAM COMPLETE  Result Date: 03/27/2020    ECHOCARDIOGRAM REPORT   Patient  Name:   Jamier Luan Pulling Date of Exam: 03/27/2020 Medical Rec #:  [id number]             Height:       72.0 in Accession #:    [id number]            Weight:       137.8 lb Date of Birth:  06-03-1940             BSA:           1.819 m Patient Age:    82 years              BP:           116/75 mmHg Patient Gender: M                     HR:           76 bpm. Exam Location:  [facility] Procedure: 2D Echo, Cardiac Doppler and Color Doppler Indications:     Abnormal ECG 794.31  History:         Patient has prior history of Echocardiogram examinations, most                  recent 05/12/2018. COPD; Risk Factors:Hypertension and Diabetes.  Sonographer:     Sherrie Sport RDCS (AE) Referring Phys:  [phone] A CALDWELL POWELL JR Diagnosing Phys: Ida Rogue MD  Sonographer Comments: Technically difficult study due to poor echo windows, no apical window and no parasternal window. Image acquisition challenging due to patient body habitus and Image acquisition challenging due to COPD. IMPRESSIONS  1. Left ventricular ejection fraction, by estimation, is 40 to 45%. The left ventricle has mildly decreased function. The left ventricle demonstrates global hypokinesis. Left ventricular diastolic parameters are indeterminate.  2. Right ventricular systolic function is normal. The right ventricular size is normal. Tricuspid regurgitation signal is inadequate for assessing PA pressure.  3. Ectopy appreciated. FINDINGS  Left Ventricle: Left ventricular ejection fraction, by estimation, is 40 to 45%. The left ventricle has mildly decreased function. The left ventricle demonstrates global hypokinesis. The left ventricular internal cavity size was normal in size. There is  no left ventricular hypertrophy. Left ventricular diastolic parameters are indeterminate. Right Ventricle: The right ventricular size is normal. No increase in right ventricular wall thickness. Right ventricular systolic function is normal. Tricuspid regurgitation signal is inadequate for assessing PA pressure. Left Atrium: Left atrial size was normal in size. Right Atrium: Right atrial size was normal in size. Pericardium: There is no evidence of pericardial effusion. Mitral Valve: The mitral  valve was not well visualized. No evidence of mitral valve regurgitation. No evidence of mitral valve stenosis. Tricuspid Valve: The tricuspid valve is not well visualized. Tricuspid valve regurgitation is not demonstrated. No evidence of tricuspid stenosis. Aortic Valve: The aortic valve was not well visualized. Aortic valve regurgitation is not visualized. No aortic stenosis is present. Pulmonic Valve: The pulmonic valve was not well visualized. Pulmonic valve regurgitation is not visualized. No evidence of pulmonic stenosis. Aorta: The aortic root is normal in size and structure. Venous: The inferior vena cava is normal in size with greater than 50% respiratory variability, suggesting right atrial pressure of 3 mmHg. IAS/Shunts: No atrial level shunt detected by color flow Doppler.  LEFT VENTRICLE PLAX 2D LVIDd:         4.30 cm LVIDs:         3.65 cm LV PW:  1.60 cm LV IVS:        1.20 cm  LEFT ATRIUM         Index LA diam:    4.70 cm 2.58 cm/m  PULMONIC VALVE PV Vmax:        0.87 m/s PV Peak grad:   3.0 mmHg RVOT Peak grad: 5 mmHg  Ida Rogue MD Electronically signed by Ida Rogue MD Signature Date/Time: 03/27/2020/4:43:00 PM    Final      Assessment/Plan:  Staph aureus bacteremia- on cefazolin Repeat bc from 12/16 NG so far 2 d echo- poor study- valves not well visualized  COVID pneumonia- remdisivir and decadron  Acute hypoxic resp failure due to COVID/COPD  COPD  Encephalopathy  Palliative on board ID will follow him peripherally this weekend Call if needed

## 2020-03-28 NOTE — NC FL2 (Signed)
[place] LEVEL OF CARE SCREENING TOOL     IDENTIFICATION  Patient Name: [name] Birthdate: 26-Dec-1940 Sex: male Admission Date (Current Location): 04/03/2020  [place] Number:  Engineering geologist and Address:  [facility] [place] [facility], [address], [place], [place] [address]      Provider Number: [id number]  Attending Physician Name and Address:  Elodia Florence., *  Relative Name and Phone Number:       Current Level of Care: Hospital Recommended Level of Care: [place] Prior Approval Number:    Date Approved/Denied:   PASRR Number: [id number]  Discharge Plan: SNF    Current Diagnoses: Patient Active Problem List   Diagnosis Date Noted  . Goals of care, counseling/discussion   . Palliative care by specialist   . DNR (do not resuscitate)   . Severe sepsis ([place]) 03/26/2020  . Pneumonia due to COVID-19 virus 03/26/2020  . Acute respiratory failure due to COVID-19 ([place]) 03/26/2020  . Acute hypernatremia 03/26/2020  . Rapid atrial fibrillation, new onset ([place]) 03/26/2020  . Acute metabolic encephalopathy 04/20/3233  . Bacterial pneumonia 03/26/2020  . Sepsis ([place]) 03/26/2020  . Atherosclerosis of lower extremity with claudication ([place]) 09/13/2017  . COPD exacerbation ([place]) 08/09/2017  . Hypokalemia 06/20/2016  . Syncope   . CAD (coronary artery disease) 06/19/2016  . Status post coronary artery stent placement   . SVT (supraventricular tachycardia) ([place])   . NSVT (nonsustained ventricular tachycardia) ([place])   . NSTEMI (non-ST elevated myocardial infarction) ([place])   . Parkinson's variant of multiple system atrophy ([place]) 12/19/2015  . Encephalopathy chronic 12/14/2015  . Chest pain   . COPD (chronic obstructive pulmonary disease) ([place])   . Heart disease   . Seizure disorder ([place])   . Depression   . Tardive dyskinesia   . Dysphagia   . Skin lesion 08/14/2015  . Shortness of breath  08/14/2015  . Essential hypertension 08/14/2015  . Hyperlipidemia 08/14/2015  . Essential tremor 08/14/2015    Orientation RESPIRATION BLADDER Height & Weight     Self  O2 ([place] 5L) External catheter,Incontinent (placed 12/15) Weight: 137 lb 12.8 oz (62.5 kg) Height:  6' (182.9 cm)  BEHAVIORAL SYMPTOMS/MOOD NEUROLOGICAL BOWEL NUTRITION STATUS      Incontinent Diet (DYS 1, Nectar Thick)  AMBULATORY STATUS COMMUNICATION OF NEEDS Skin   Extensive Assist Verbally Normal                       Personal Care Assistance Level of Assistance  Bathing,Feeding,Dressing Bathing Assistance: Maximum assistance Feeding assistance: Limited assistance Dressing Assistance: Maximum assistance     Functional Limitations Info  Sight,Hearing,Speech Sight Info: Adequate Hearing Info: Adequate Speech Info: Adequate    SPECIAL CARE FACTORS FREQUENCY  PT (By licensed PT),OT (By licensed OT)     PT Frequency: 5x OT Frequency: 5x            Contractures Contractures Info: Not present    Additional Factors Info  Code Status,Allergies Code Status Info: DNR Allergies Info: Levaquin (Levofloxacin In D5w), Penicillins           Current Medications (03/28/2020):  This is the current hospital active medication list Current Facility-Administered Medications  Medication Dose Route Frequency Provider Last Rate Last Admin  . 0.9 %  sodium chloride infusion   Intravenous PRN Elodia Florence., MD 10 mL/hr at 03/28/20 1002 500 mL at 03/28/20 1002  . acetaminophen (TYLENOL) tablet 650  mg  650 mg Oral Q6H PRN Athena Masse, MD       Or  . acetaminophen (TYLENOL) suppository 650 mg  650 mg Rectal Q6H PRN Athena Masse, MD      . ascorbic acid (VITAMIN C) tablet 500 mg  500 mg Oral Daily Athena Masse, MD   500 mg at 03/28/20 0950  . ceFAZolin (ANCEF) IVPB 2g/100 mL premix  2 g Intravenous Q8H Tsosie Billing, MD   Stopped at 03/28/20 0533  . chlorhexidine (PERIDEX) 0.12 %  solution 15 mL  15 mL Mouth Rinse BID Elodia Florence., MD   15 mL at 03/28/20 0949  . Chlorhexidine Gluconate Cloth 2 % PADS 6 each  6 each Topical Q0600 Elodia Florence., MD   6 each at 03/28/20 0507  . chlorpheniramine-HYDROcodone (TUSSIONEX) 10-8 MG/5ML suspension 5 mL  5 mL Oral Q12H PRN Athena Masse, MD      . cyanocobalamin ((VITAMIN B-12)) injection 1,000 mcg  1,000 mcg Intramuscular Daily Elodia Florence., MD   1,000 mcg at 03/27/20 2155   Followed by  . [START ON 04/03/2020] vitamin B-12 (CYANOCOBALAMIN) tablet 1,000 mcg  1,000 mcg Oral Daily Elodia Florence., MD      . dexamethasone (DECADRON) injection 6 mg  6 mg Intravenous Q24H Athena Masse, MD   6 mg at 03/28/20 0154  . dextrose 5 %-0.45 % sodium chloride infusion   Intravenous Continuous Elodia Florence., MD 100 mL/hr at 03/28/20 1000 New Bag at 03/28/20 1000  . enoxaparin (LOVENOX) injection 40 mg  40 mg Subcutaneous Q24H Benita Gutter, RPH   40 mg at 03/27/20 2154  . feeding supplement (NEPRO CARB STEADY) liquid 237 mL  237 mL Oral TID BM Elodia Florence., MD   237 mL at 03/28/20 0951  . folic acid 1 mg in sodium chloride 0.9 % 50 mL IVPB  1 mg Intravenous Daily Elodia Florence., MD 100.4 mL/hr at 03/27/20 1848 1 mg at 03/27/20 1848  . guaiFENesin-dextromethorphan (ROBITUSSIN DM) 100-10 MG/5ML syrup 10 mL  10 mL Oral Q4H PRN Athena Masse, MD      . insulin aspart (novoLOG) injection 0-5 Units  0-5 Units Subcutaneous QHS Judd Gaudier V, MD      . insulin aspart (novoLOG) injection 0-9 Units  0-9 Units Subcutaneous TID WC Athena Masse, MD   2 Units at 03/28/20 [phone]  . levalbuterol [facility] HFA) inhaler 1-2 puff  1-2 puff Inhalation Q6H PRN Sharion Settler, NP      . linagliptin (TRADJENTA) tablet 5 mg  5 mg Oral Daily Athena Masse, MD   5 mg at 03/28/20 0950  . MEDLINE mouth rinse  15 mL Mouth Rinse q12n4p Elodia Florence., MD   15 mL at 03/27/20 1826  . metoprolol  tartrate (LOPRESSOR) injection 5 mg  5 mg Intravenous Q4H PRN Elodia Florence., MD      . metoprolol tartrate (LOPRESSOR) tablet 25 mg  25 mg Oral BID Minna Merritts, MD   25 mg at 03/28/20 0949  . multivitamin with minerals tablet 1 tablet  1 tablet Oral Daily Elodia Florence., MD   1 tablet at 03/28/20 0950  . mupirocin ointment (BACTROBAN) 2 % 1 application  1 application Nasal BID Elodia Florence., MD   1 application at 01/74/94 [phone]  . ondansetron (ZOFRAN) tablet 4 mg  4 mg  Oral Q6H PRN Athena Masse, MD       Or  . ondansetron [facility]) injection 4 mg  4 mg Intravenous Q6H PRN Athena Masse, MD      . remdesivir 100 mg in sodium chloride 0.9 % 100 mL IVPB  100 mg Intravenous Daily Judd Gaudier V, MD 200 mL/hr at 03/28/20 1003 100 mg at 03/28/20 1003  . thiamine (B-1) injection 100 mg  100 mg Intravenous Daily Elodia Florence., MD   100 mg at 03/28/20 0949  . zinc sulfate capsule 220 mg  220 mg Oral Daily Athena Masse, MD   220 mg at 03/28/20 3582     Discharge Medications: Please see discharge summary for a list of discharge medications.  Relevant Imaging Results:  Relevant Lab Results:   Additional Information [id number]  Eileen Stanford, LCSW

## 2020-03-29 DIAGNOSIS — B9561 Methicillin susceptible Staphylococcus aureus infection as the cause of diseases classified elsewhere: Secondary | ICD-10-CM | POA: Diagnosis not present

## 2020-03-29 DIAGNOSIS — R7881 Bacteremia: Secondary | ICD-10-CM | POA: Diagnosis not present

## 2020-03-29 DIAGNOSIS — U071 COVID-19: Secondary | ICD-10-CM | POA: Diagnosis not present

## 2020-03-29 DIAGNOSIS — J1282 Pneumonia due to coronavirus disease 2019: Secondary | ICD-10-CM | POA: Diagnosis not present

## 2020-03-29 LAB — GLUCOSE, CAPILLARY
Glucose-Capillary: 223 mg/dL — ABNORMAL HIGH (ref 70–99)
Glucose-Capillary: 154 mg/dL — ABNORMAL HIGH (ref 70–99)
Glucose-Capillary: 117 mg/dL — ABNORMAL HIGH (ref 70–99)
Glucose-Capillary: 108 mg/dL — ABNORMAL HIGH (ref 70–99)

## 2020-03-29 LAB — COMPREHENSIVE METABOLIC PANEL
ALT: 12 U/L (ref 0–44)
AST: 21 U/L (ref 15–41)
Albumin: 2.4 g/dL — ABNORMAL LOW (ref 3.5–5.0)
Alkaline Phosphatase: 45 U/L (ref 38–126)
Anion gap: 7 (ref 5–15)
BUN: 18 mg/dL (ref 8–23)
CO2: 29 mmol/L (ref 22–32)
Calcium: 7.3 mg/dL — ABNORMAL LOW (ref 8.9–10.3)
Chloride: 103 mmol/L (ref 98–111)
Creatinine, Ser: 0.5 mg/dL — ABNORMAL LOW (ref 0.61–1.24)
GFR, Estimated: >60 mL/min (ref >60)
Glucose, Bld: 203 mg/dL — ABNORMAL HIGH (ref 70–99)
Potassium: 3.4 mmol/L — ABNORMAL LOW (ref 3.5–5.1)
Sodium: 139 mmol/L (ref 135–145)
Total Bilirubin: 1.1 mg/dL (ref 0.3–1.2)
Total Protein: 5 g/dL — ABNORMAL LOW (ref 6.5–8.1)

## 2020-03-29 LAB — CBC WITH DIFFERENTIAL/PLATELET
Abs Immature Granulocytes: 0.15 10*3/uL — ABNORMAL HIGH (ref 0.00–0.07)
Basophils Absolute: 0 10*3/uL (ref 0.0–0.1)
Basophils Relative: 0 %
Eosinophils Absolute: 0 10*3/uL (ref 0.0–0.5)
Eosinophils Relative: 0 %
HCT: 39.6 % (ref 39.0–52.0)
Hemoglobin: 13.8 g/dL (ref 13.0–17.0)
Immature Granulocytes: 1 %
Lymphocytes Relative: 3 %
Lymphs Abs: 0.4 10*3/uL — ABNORMAL LOW (ref 0.7–4.0)
MCH: 30.9 pg (ref 26.0–34.0)
MCHC: 34.8 g/dL (ref 30.0–36.0)
MCV: 88.6 fL (ref 80.0–100.0)
Monocytes Absolute: 0.6 10*3/uL (ref 0.1–1.0)
Monocytes Relative: 5 %
Neutro Abs: 11 10*3/uL — ABNORMAL HIGH (ref 1.7–7.7)
Neutrophils Relative %: 91 %
Platelets: 212 10*3/uL (ref 150–400)
RBC: 4.47 MIL/uL (ref 4.22–5.81)
RDW: 13.2 % (ref 11.5–15.5)
WBC: 12.1 10*3/uL — ABNORMAL HIGH (ref 4.0–10.5)
nRBC: 0 % (ref 0.0–0.2)

## 2020-03-29 LAB — URINE CULTURE: Culture: NO GROWTH

## 2020-03-29 LAB — CULTURE, BLOOD (SINGLE): Special Requests: ADEQUATE

## 2020-03-29 LAB — C-REACTIVE PROTEIN: CRP: 1.2 mg/dL — ABNORMAL HIGH (ref <1.0)

## 2020-03-29 LAB — FIBRIN DERIVATIVES D-DIMER (ARMC ONLY): Fibrin derivatives D-dimer (ARMC): 773.84 ng/mL (FEU) — ABNORMAL HIGH (ref 0.00–499.00)

## 2020-03-29 MED ORDER — DEXTROSE IN LACTATED RINGERS 5 % IV SOLN: INTRAVENOUS | Status: AC

## 2020-03-29 MED ORDER — POTASSIUM CHLORIDE 20 MEQ PO PACK
40.0000 meq | PACK | Freq: Once | ORAL | Status: DC
  Filled 2020-03-29: qty 2

## 2020-03-29 MED ORDER — POTASSIUM CHLORIDE CRYS ER 20 MEQ PO TBCR
40.0000 meq | EXTENDED_RELEASE_TABLET | Freq: Once | ORAL | Status: AC
  Administered 2020-03-29: 13:00:00 40 meq via ORAL
  Filled 2020-03-29: qty 2

## 2020-03-29 NOTE — Progress Notes (Signed)
Speech Language Pathology Treatment: Dysphagia  Patient Details Name: [name] MRN: [id number] DOB: December 07, 1940 Today's Date: 03/29/2020 Time: 3532-9924 SLP Time Calculation (min) (ACUTE ONLY): 45 min  Assessment / Plan / Recommendation Clinical Impression  Pt appears to present w/ oropharyngeal phase dysphagia w/ swallow function SIGNIFICANTLY impacted by his declined Cognitive status impacting his overall awareness during oral intake tasks. The Cognitive decline impacts his overall awareness/engagement w/ po tasks which can increase risk for aspiration. Though present d/t Cognitive decline, his risk for aspiration is reduced when following aspiration precautions and when fully supported during the meal w/ setup, feeding, and aspiration precautions. He requires mod+ verbal/tactile/visual cues for orientation to bolus presentation; and overall need to REDUCE DISTRACTIONS during po trials. Noted min white patchiness along lateral tongue and underneath sidein buccal area -- will inform MD.  Pt was positioned upright in bed then supported w/ feeding; he consumed trials of Nectar liquids VIA SPOON, purees w/ No immediate, overt clinical s/s of aspiration noted; no decline in mumbled phonations, no cough, and no decline in respiratory status during/post trials. Oral phase was c/b min lengthy time for bolus management and munching mastication of purees. Oral clearing required Time as he used lingual sweeping and smacking to clear orally b/t bites. TSP of Nectar liquids were given in b/t foods to Alternate bites/sips to aid clearing, intake overall though this required Mod+ cues.  Discussed pt's presentation this tx session w/ MD: noticed more incoherent, constant mumbled speech than at eval.  Little to no improvement in presentation from eval w/ more cognitive/mental status decline felt. Recommend continue current dysphagia diet consistency w/ Nectar liquids via TSP as needed vs Cup; aspiration  precautions; pills Crushed in puree; supervision at meals w/ NSG. Precautions posted in room. Recommend Dietician, Palliative Care f/u. NSG/MD updated.     HPI HPI: Pt is a 79 y.o. male with medical history significant for HTN, COPD, seizures disorder, Parkinson's, dysphagia, CAD, history of COVID-19 exposure from his wife who is currently hospitalized for Covid, who is brought in by EMS with a several day history of shortness of breath and cough, and who has been lying in bed, with poor oral intake, arriving unkempt and soiled and disoriented.  Patient is unable to contribute to history due to altered mental status. Covid positive.  Chest x-ray showed right lower lobe infiltrate. Patient treated with sepsis fluid bolus, IV antibiotics and also started on Decadron.  Dietician is following for Malnutrition; pt has only eaten a few bites of applesauce during admission and is refusing all other food per report.  Pt is cachectic-appearing at Baseline. Pt c/o Dysphagia.      SLP Plan  Continue with current plan of care       Recommendations  Diet recommendations: Dysphagia 1 (puree);Nectar-thick liquid Liquids provided via: Teaspoon;Cup (strict monitoring) Medication Administration: Crushed with puree (for safe swallowing) Supervision: Staff to assist with self feeding;Full supervision/cueing for compensatory strategies Compensations: Minimize environmental distractions;Slow rate;Small sips/bites;Lingual sweep for clearance of pocketing;Multiple dry swallows after each bite/sip;Follow solids with liquid Postural Changes and/or Swallow Maneuvers: Seated upright 90 degrees;Upright 30-60 min after meal                General recommendations:  (Dietician f/u; Palliative Care for [place]) Oral Care Recommendations: Oral care BID;Oral care before and after PO;Staff/trained caregiver to provide oral care Follow up Recommendations:  (TBD) SLP Visit Diagnosis: Dysphagia, oropharyngeal phase (R13.12)  (Cognitive decilne) Plan: Continue with current plan of care  [place], MS, CCC-SLP Speech Language Pathologist Rehab Services [phone] [facility] 03/29/2020, 9:49 AM

## 2020-03-29 NOTE — Plan of Care (Signed)
  Problem: Clinical Measurements: Goal: Will remain free from infection Outcome: Progressing Goal: Respiratory complications will improve Outcome: Progressing Goal: Cardiovascular complication will be avoided Outcome: Progressing   Problem: Pain Managment: Goal: General experience of comfort will improve Outcome: Progressing   

## 2020-03-29 NOTE — Progress Notes (Addendum)
PROGRESS NOTE    [name]  TKW:[id number] DOB: 1941/01/28 DOA: 04/07/2020 PCP: Jay Crouch, MD   Chief Complaint  Patient presents with  . Shortness of Breath    Brief Narrative:  [name] is [name] 79 y.o. male with medical history significant for HTN, COPD, seizures disorder, Parkinson's, CAD, with history of COVID-19 exposure from his wife who is currently hospitalized for Covid, who is brought in by EMS with [name] several day history of shortness of breath and cough, and who has been lying in bed, with poor oral intake, arriving unkempt and soiled and disoriented.  Patient is unable to contribute to history due to altered mental status.  On arrival he was afebrile at 97.2, BP 172/103, heart rate 95 respirations 40/min with O2 sat 84% on room air improving to 100% on 3 L.  While in the ER he went into rapid [name]. fib with heart rate as high as 159, with O2 sat dropping as low as 80% improving to 99% with 4 L.  Blood work returned with WBC of 12,000 and lactic acid 7.4, improving to 4 following 2 L fluid bolus.  Hemoglobin 18.3 suspect secondary to hemoconcentration.  CMP showed sodium of 152, BUN 45, creatinine 1.21.  D-dimer 1824.  Bilirubin 2.5.  Covid positive.  Chest x-ray showed right lower lobe infiltrate.  Patient treated with sepsis fluid bolus, IV antibiotics and also started on Decadron.  Hospitalist consulted for admission.     Assessment & Plan:   Active Problems:   SOB (shortness of breath)   Essential hypertension   COPD (chronic obstructive pulmonary disease) (HCC)   Seizure disorder (HCC)   Parkinson's variant of multiple system atrophy (HCC)   PSVT (paroxysmal supraventricular tachycardia) (HCC)   CAD (coronary artery disease)   Severe sepsis (HCC)   Pneumonia due to COVID-19 virus   Acute respiratory failure due to COVID-19 [facility])   Acute hypernatremia   Rapid atrial fibrillation, new onset ([place])   Acute metabolic encephalopathy   Bacterial  pneumonia   Sepsis ([place])   Goals of care, counseling/discussion   Palliative care by specialist   DNR (do not resuscitate)  Goals of care: DNR per son and pt's wife.  Overall prognosis is poor.  Palliative care following.  Severe sepsis/septic shock secondary to covid 19 viral pneumonia  Acute hypoxic respiratory failure  Superimposed Bacteria Community Acquired Pneumonia  MSSA Bacteremia - Patient presented with altered mental status, cough and shortness of breath, O2 sat 88% on room air, tachycardia, increased work of breathing requiring 4 L O2, leukocytosis, lactic acid 7.  Afebrile and not hypotensive - currently requiring 5 L [place] - CT chest with right lower lobe pneumonia - SLP eval for aspiration -  Dysphagia 1, nectar thick liquids -continue steroids and remdesivir.  Hold off on baricitinib due to bacteremia - 12/14 blood cx with staph aureus (mssa) - repeat cx from 12/16 NGTD x2 days - urine cx pending  - continue ancef per ID - ID c/s, appreciate recommendations - recommending ancef, echo (EF 40-45%, global hypokinesis, valves not well visualized), repeat blood cx -- maintain O2>88% -- lactate improved -- I/O, daily weights -- Prone as able, OOB, flutter, IS, therapy  COVID-19 Labs  Recent Labs    03/27/20 0149 03/28/20 0245 03/28/20 0444 03/29/20 0617  FERRITIN  --  200  --   --   CRP 1.6*  --  2.5* 1.2*    Lab Results  Component Value Date  SARSCOV2NAA POSITIVE ([name]) 04/02/2020   Lactic Acidosis: suspect this is 2/2 hypovolemia/dehydration/sepsis from above, pt still with hypernatremia, reports feeling thirsty, does not clinically appear overloaded on exam.  Follow with IVF.  Elevated BNP noted, though clinically not overloaded at this time (received lasix x1 overnight). - improved with IVF, follow  Supraventricular Tachycardia - appreciate cardiology assistance -> paroxysmal atrial tachycardia vs MAT  - metop PO 25 mg BID - prn metop for sustained HR >  120 - follow with IVF -Cardiology consult, appreciate recs --follow echo - notable for decreased EF, appreciate cards recs  HFrEF: does not appear volume overloaded. New onset, possibly 2/2 stress with covid infection and bacteremia.   Cardiology recs appreciated     Elevated D dimer: follow LE [place] (negative for DVT) and CT PE protocol (without PE)  Acute hypernatremia - improving with IVF, continue to monitor  Acute metabolic encephalopathy -Secondary to acute infection and sepsis - [name]&Ox2 today (knew dec, said he was home) - able to interact somewhat, but then has periods of moaning/mumbling, but denies pain or discomfort - delirium precautions -- follow TSH (low follow free T4), vitamin b12 (pending), folate (low, supplement), ammonia (wnl)  Abnormal TFT: as TSH not completely suppressed and repeat free T4 borderline elevated - suspect this is abnormal in setting of acute illness, follow   Folate Deficiency  B12 Deficiency: supplement    Essential hypertension -prn metop, BP ok  - metoprolol 25 mg BID --med rec hasn't been completed yet (will try to follow with son)    COPD (chronic obstructive pulmonary disease) (HCC) -Albuterol inhaler scheduled and as needed as per treatment of Covid    Parkinson's variant of multiple system atrophy ([place]) -Chronic and stable    CAD (coronary artery disease) -.  Stable -Continue aspirin, metoprolol, rosuvastatin and nitroglycerin pending med rec  Med rec currently pending, follow with pharmacy (and family) - med rec looks like he's not taking   DVT prophylaxis: lovenox Code Status: full  Family Communication: none at bedside - son over phone Disposition:   Status is: Inpatient  Remains inpatient appropriate because:Inpatient level of care appropriate due to severity of illness   Dispo: The patient is from: Home              Anticipated d/c is to: pending              Anticipated d/c date is: > 3 days              Patient  currently is not medically stable to d/c.       Consultants:   none  Procedures:  none  Antimicrobials: Anti-infectives (From admission, onward)   Start     Dose/Rate Route Frequency Ordered Stop   03/28/20 1000  cefTRIAXone (ROCEPHIN) 2 g in sodium chloride 0.9 % 100 mL IVPB  Status:  Discontinued        2 g 200 mL/hr over 30 Minutes Intravenous Every 24 hours 03/27/20 0827 03/27/20 1557   03/27/20 2000  ceFAZolin (ANCEF) IVPB 2g/100 mL premix        2 g 200 mL/hr over 30 Minutes Intravenous Every 8 hours 03/27/20 1559     03/27/20 1000  remdesivir 100 mg in sodium chloride 0.9 % 100 mL IVPB       "Followed by" Linked Group Details   100 mg 200 mL/hr over 30 Minutes Intravenous Daily 03/26/20 0146 03/31/20 0959   03/27/20 0930  cefTRIAXone (ROCEPHIN) 1 g in  sodium chloride 0.9 % 100 mL IVPB        1 g 200 mL/hr over 30 Minutes Intravenous  Once 03/27/20 0830 03/27/20 1124   03/26/20 2300  azithromycin (ZITHROMAX) 500 mg in sodium chloride 0.9 % 250 mL IVPB  Status:  Discontinued        500 mg 250 mL/hr over 60 Minutes Intravenous Every 24 hours 03/26/20 0218 03/26/20 1448   03/26/20 2200  cefTRIAXone (ROCEPHIN) 1 g in sodium chloride 0.9 % 100 mL IVPB  Status:  Discontinued        1 g 200 mL/hr over 30 Minutes Intravenous Every 24 hours 03/26/20 2053 03/27/20 0826   03/26/20 2200  azithromycin (ZITHROMAX) 500 mg in sodium chloride 0.9 % 250 mL IVPB  Status:  Discontinued        500 mg 250 mL/hr over 60 Minutes Intravenous Every 24 hours 03/26/20 2053 03/27/20 1559   03/26/20 0300  cefTRIAXone (ROCEPHIN) 1 g in sodium chloride 0.9 % 100 mL IVPB  Status:  Discontinued        1 g 200 mL/hr over 30 Minutes Intravenous Every 24 hours 03/26/20 0218 03/26/20 1448   03/26/20 0145  remdesivir 200 mg in sodium chloride 0.9% 250 mL IVPB       "Followed by" Linked Group Details   200 mg 580 mL/hr over 30 Minutes Intravenous Once 03/26/20 0146 03/26/20 0556   03/26/2020 2245   levofloxacin (LEVAQUIN) IVPB 750 mg        750 mg 100 mL/hr over 90 Minutes Intravenous  Once 04/02/2020 2244 03/26/20 0039         Subjective: Qiana Landgrebe&Ox2, denies pain  Objective: Vitals:   03/29/20 0401 03/29/20 0410 03/29/20 0905 03/29/20 1241  BP: (!) 151/91  133/83 123/74  Pulse: 96  94 76  Resp: 18  20   Temp: (!) 97.5 F (36.4 C)  97.7 F (36.5 C) (!) 96.8 F (36 C)  TempSrc: Oral  Oral Axillary  SpO2: (!) 89% 94% 94% 94%  Weight: 62.9 kg     Height:        Intake/Output Summary (Last 24 hours) at 03/29/2020 1415 Last data filed at 03/29/2020 0938 Gross per 24 hour  Intake 2011.64 ml  Output 1700 ml  Net 311.64 ml   Filed Weights   03/27/20 0417 03/28/20 0304 03/29/20 0401  Weight: 62.5 kg 62.5 kg 62.9 kg    Examination:  General: No acute distress. Cachetic, ill appearing. Cardiovascular: Heart sounds show Vasil Juhasz regular rate, and rhythm. Lungs: Clear to auscultation bilaterally Abdomen: Soft, nontender, nondistended Neurological: Alert and oriented 2, able to interact with me and answer some questions, but confused - has periods of moaning, but denies discomfort. Moves all extremities 4. Cranial nerves II through XII grossly intact. Skin: Warm and dry. No rashes or lesions. Extremities: No clubbing or cyanosis. No edema.    Data Reviewed: I have personally reviewed following labs and imaging studies  CBC: Recent Labs  Lab 03/15/2020 2158 03/26/20 0507 03/27/20 0149 03/28/20 0245 03/29/20 0617  WBC 12.2* 8.0 11.0* 11.9* 12.1*  NEUTROABS 10.1*  --  9.3* 10.1* 11.0*  HGB 18.3* 14.5 13.8 13.5 13.8  HCT 55.2* 43.7 40.9 40.9 39.6  MCV 90.2 90.3 91.5 90.3 88.6  PLT 302 201 182 203 854    Basic Metabolic Panel: Recent Labs  Lab 03/19/2020 2158 03/26/20 1030 03/27/20 0149 03/27/20 0659 03/28/20 0245 03/29/20 0617  NA 152* 148* 148*  --  146* 139  K 3.9 4.3 3.6  --  3.6 3.4*  CL 106 112* 113*  --  109 103  CO2 26 26 27   --  28 29  GLUCOSE 129* 119* 88   --  127* 203*  BUN 45* 34* 28*  --  25* 18  CREATININE 1.21 0.70 0.68  --  0.59* 0.50*  CALCIUM 8.6* 7.7* 7.7*  --  7.6* 7.3*  MG  --   --   --  2.0 2.1  --   PHOS  --   --   --   --  2.4*  --     GFR: Estimated Creatinine Clearance: 68.8 mL/min (Pryce Folts) (by C-G formula based on SCr of 0.5 mg/dL (L)).  Liver Function Tests: Recent Labs  Lab 03/19/2020 2158 03/26/20 1030 03/27/20 0149 03/28/20 0245 03/29/20 0617  AST 45* 28 26 27 21   ALT 16 10 11 13 12   ALKPHOS 61 49 47 42 45  BILITOT 2.5* 1.4* 1.0 1.0 1.1  PROT 7.7 5.6* 5.3* 5.3* 5.0*  ALBUMIN 3.2* 2.5* 2.3* 2.5* 2.4*    CBG: Recent Labs  Lab 03/28/20 1231 03/28/20 1803 03/28/20 1930 03/29/20 0910 03/29/20 1240  GLUCAP 98 116* 157* 223* 154*     Recent Results (from the past 240 hour(s))  Blood culture (routine single)     Status: Abnormal   Collection Time: 03/18/2020  9:58 PM   Specimen: BLOOD  Result Value Ref Range Status   Specimen Description   Final    BLOOD BLOOD RIGHT FOREARM Performed at [facility], [address], [place], [place] [address]    Special Requests   Final    BOTTLES DRAWN AEROBIC AND ANAEROBIC Blood Culture adequate volume Performed at [facility], [address] [place], [place] [address]    Culture  Setup Time   Final    GRAM POSITIVE COCCI ANAEROBIC BOTTLE ONLY Organism ID to follow CRITICAL RESULT CALLED TO, READ BACK BY AND VERIFIED WITH: JASON ROBBINS AT 3893 03/27/20 [place] Performed at [facility], [address] [place],  [address]    Culture STAPHYLOCOCCUS AUREUS (Sabirin Baray)  Final   Report Status 03/29/2020 FINAL  Final   Organism ID, Bacteria STAPHYLOCOCCUS AUREUS  Final      Susceptibility   Staphylococcus aureus - MIC*    CIPROFLOXACIN <=0.5 SENSITIVE Sensitive     ERYTHROMYCIN <=0.25 SENSITIVE Sensitive     GENTAMICIN <=0.5 SENSITIVE Sensitive     OXACILLIN 0.5 SENSITIVE Sensitive     TETRACYCLINE <=1 SENSITIVE Sensitive     VANCOMYCIN  1 SENSITIVE Sensitive     TRIMETH/SULFA <=10 SENSITIVE Sensitive     CLINDAMYCIN <=0.25 SENSITIVE Sensitive     RIFAMPIN <=0.5 SENSITIVE Sensitive     Inducible Clindamycin NEGATIVE Sensitive     * STAPHYLOCOCCUS AUREUS  Resp Panel by RT-PCR (Flu Liandro Thelin&B, Covid) Nasopharyngeal Swab     Status: Abnormal   Collection Time: 03/27/2020  9:58 PM   Specimen: Nasopharyngeal Swab; Nasopharyngeal(NP) swabs in vial transport medium  Result Value Ref Range Status   SARS Coronavirus 2 by RT PCR POSITIVE (Manmeet Arzola) NEGATIVE Final    Comment: RESULT CALLED TO, READ BACK BY AND VERIFIED WITH: JEN AGNEW RN 2324 03/20/2020 (NOTE) SARS-CoV-2 target nucleic acids are DETECTED.  The SARS-CoV-2 RNA is generally detectable in upper respiratory specimens during the acute phase of infection. Positive results are indicative of the presence of the identified virus, but do not rule out bacterial infection or co-infection with other  pathogens not detected by the test. Clinical correlation with patient history and other diagnostic information is necessary to determine patient infection status. The expected result is Negative.  Fact Sheet for Patients: EntrepreneurPulse.com.au  Fact Sheet for Healthcare Providers: IncredibleEmployment.be  This test is not yet approved or cleared by the [place] FDA and  has been authorized for detection and/or diagnosis of SARS-CoV-2 by FDA under an Emergency Use Authorization (EUA).  This EUA will remain in effect (meaning this test can be used)  for the duration of  the COVID-19 declaration under Section 564(b)(1) of the Act, 21 U.S.C. section 360bbb-3(b)(1), unless the authorization is terminated or revoked sooner.     Influenza Jennae Hakeem by PCR NEGATIVE NEGATIVE Final   Influenza B by PCR NEGATIVE NEGATIVE Final    Comment: (NOTE) The Xpert Xpress SARS-CoV-2/FLU/RSV plus assay is intended as an aid in the diagnosis of influenza from Nasopharyngeal  swab specimens and should not be used as Gillie Fleites sole basis for treatment. Nasal washings and aspirates are unacceptable for Xpert Xpress SARS-CoV-2/FLU/RSV testing.  Fact Sheet for Patients: EntrepreneurPulse.com.au  Fact Sheet for Healthcare Providers: IncredibleEmployment.be  This test is not yet approved or cleared by the [place] FDA and has been authorized for detection and/or diagnosis of SARS-CoV-2 by FDA under an Emergency Use Authorization (EUA). This EUA will remain in effect (meaning this test can be used) for the duration of the COVID-19 declaration under Section 564(b)(1) of the Act, 21 U.S.C. section 360bbb-3(b)(1), unless the authorization is terminated or revoked.  Performed at [facility], [address] [place], [place] [address]   Blood Culture ID Panel (Reflexed)     Status: Abnormal   Collection Time: 03/31/2020  9:58 PM  Result Value Ref Range Status   Enterococcus faecalis NOT DETECTED NOT DETECTED Final   Enterococcus Faecium NOT DETECTED NOT DETECTED Final   Listeria monocytogenes NOT DETECTED NOT DETECTED Final   Staphylococcus species DETECTED (Lalania Haseman) NOT DETECTED Final    Comment: CRITICAL RESULT CALLED TO, READ BACK BY AND VERIFIED WITH:  JASON ROBBINS AT 0509 03/27/20 SDR    Staphylococcus aureus (BCID) DETECTED (Bobbyjoe Pabst) NOT DETECTED Final    Comment: CRITICAL RESULT CALLED TO, READ BACK BY AND VERIFIED WITH:  JASON ROBBINS AT 0509 03/27/20 SDR    Staphylococcus epidermidis NOT DETECTED NOT DETECTED Final   Staphylococcus lugdunensis NOT DETECTED NOT DETECTED Final   Streptococcus species NOT DETECTED NOT DETECTED Final   Streptococcus agalactiae NOT DETECTED NOT DETECTED Final   Streptococcus pneumoniae NOT DETECTED NOT DETECTED Final   Streptococcus pyogenes NOT DETECTED NOT DETECTED Final   Janyra Barillas.calcoaceticus-baumannii NOT DETECTED NOT DETECTED Final   Bacteroides fragilis NOT DETECTED NOT DETECTED Final    Enterobacterales NOT DETECTED NOT DETECTED Final   Enterobacter cloacae complex NOT DETECTED NOT DETECTED Final   Escherichia coli NOT DETECTED NOT DETECTED Final   Klebsiella aerogenes NOT DETECTED NOT DETECTED Final   Klebsiella oxytoca NOT DETECTED NOT DETECTED Final   Klebsiella pneumoniae NOT DETECTED NOT DETECTED Final   Proteus species NOT DETECTED NOT DETECTED Final   Salmonella species NOT DETECTED NOT DETECTED Final   Serratia marcescens NOT DETECTED NOT DETECTED Final   Haemophilus influenzae NOT DETECTED NOT DETECTED Final   Neisseria meningitidis NOT DETECTED NOT DETECTED Final   Pseudomonas aeruginosa NOT DETECTED NOT DETECTED Final   Stenotrophomonas maltophilia NOT DETECTED NOT DETECTED Final   Candida albicans NOT DETECTED NOT DETECTED Final   Candida auris NOT DETECTED NOT DETECTED Final  Candida glabrata NOT DETECTED NOT DETECTED Final   Candida krusei NOT DETECTED NOT DETECTED Final   Candida parapsilosis NOT DETECTED NOT DETECTED Final   Candida tropicalis NOT DETECTED NOT DETECTED Final   Cryptococcus neoformans/gattii NOT DETECTED NOT DETECTED Final   Meth resistant mecA/C and MREJ NOT DETECTED NOT DETECTED Final    Comment: Performed at [facility], [address] [place], [place] [address]  CULTURE, BLOOD (ROUTINE X 2) w Reflex to ID Panel     Status: None (Preliminary result)   Collection Time: 03/27/20  7:33 AM   Specimen: BLOOD  Result Value Ref Range Status   Specimen Description BLOOD LEFT HAND  Final   Special Requests   Final    BOTTLES DRAWN AEROBIC AND ANAEROBIC Blood Culture results may not be optimal due to an excessive volume of blood received in culture bottles   Culture   Final    NO GROWTH 2 DAYS Performed at [facility], [address] [place], [place] [address]    Report Status PENDING  Incomplete  CULTURE, BLOOD (ROUTINE X 2) w Reflex to ID Panel     Status: None (Preliminary result)   Collection Time: 03/27/20   7:35 AM   Specimen: BLOOD  Result Value Ref Range Status   Specimen Description BLOOD RIGHT HAND  Final   Special Requests   Final    BOTTLES DRAWN AEROBIC AND ANAEROBIC Blood Culture results may not be optimal due to an excessive volume of blood received in culture bottles   Culture   Final    NO GROWTH 2 DAYS Performed at [facility], [address], [place], [place] [address]    Report Status PENDING  Incomplete  MRSA PCR Screening     Status: Abnormal   Collection Time: 03/27/20 12:00 PM   Specimen: Nasopharyngeal  Result Value Ref Range Status   MRSA by PCR POSITIVE (Dia Donate) NEGATIVE Final    Comment:        The GeneXpert MRSA Assay (FDA approved for NASAL specimens only), is one component of Zolton Dowson comprehensive MRSA colonization surveillance program. It is not intended to diagnose MRSA infection nor to guide or monitor treatment for MRSA infections. RESULT CALLED TO, READ BACK BY AND VERIFIED WITHMaryland Pink AT 1354 03/27/20 SDR Performed at [facility], [address] [place], [place] [address]   Urine culture     Status: None (Preliminary result)   Collection Time: 03/27/20 12:08 PM   Specimen: In/Out Cath Urine  Result Value Ref Range Status   Specimen Description   Final    IN/OUT CATH URINE Performed at [facility], [address], [place], [place] [address]    Special Requests   Final    NONE Performed at [facility], [address] [place], [place] [address]    Culture   Final    CULTURE REINCUBATED FOR BETTER GROWTH Performed at [facility], [place] [address], [place], [place] [address]    Report Status PENDING  Incomplete         Radiology Studies: No results found.      Scheduled Meds: . vitamin C  500 mg Oral Daily  . chlorhexidine  15 mL Mouth Rinse BID  . Chlorhexidine Gluconate Cloth  6 each Topical Q0600  . cyanocobalamin  1,000 mcg Intramuscular Daily   Followed by  . [START ON  04/03/2020] vitamin B-12  1,000 mcg Oral Daily  . dexamethasone (DECADRON)  injection  6 mg Intravenous Q24H  . enoxaparin (LOVENOX) injection  40 mg Subcutaneous Q24H  . feeding supplement (NEPRO CARB STEADY)  237 mL Oral TID BM  . insulin aspart  0-5 Units Subcutaneous QHS  . insulin aspart  0-9 Units Subcutaneous TID WC  . linagliptin  5 mg Oral Daily  . mouth rinse  15 mL Mouth Rinse q12n4p  . metoprolol tartrate  25 mg Oral BID  . multivitamin with minerals  1 tablet Oral Daily  . mupirocin ointment  1 application Nasal BID  . thiamine injection  100 mg Intravenous Daily  . zinc sulfate  220 mg Oral Daily   Continuous Infusions: . sodium chloride Stopped (03/28/20 1506)  .  ceFAZolin (ANCEF) IV 2 g (03/29/20 5726)  . folic acid (FOLVITE) IVPB 1 mg (03/29/20 1301)  . remdesivir 100 mg in NS 100 mL 100 mg (03/29/20 0938)     LOS: 3 days    Time spent: over 30 min    Fayrene Helper, MD [facility]   To contact the attending provider between 7A-7P or the covering provider during after hours 7P-7A, please log into the web site [email or web address] and access using universal [place] password for that web site. If you do not have the password, please call the hospital operator.  03/29/2020, 2:15 PM

## 2020-03-30 DIAGNOSIS — J1282 Pneumonia due to coronavirus disease 2019: Secondary | ICD-10-CM | POA: Diagnosis not present

## 2020-03-30 DIAGNOSIS — I2510 Atherosclerotic heart disease of native coronary artery without angina pectoris: Secondary | ICD-10-CM

## 2020-03-30 DIAGNOSIS — U071 COVID-19: Secondary | ICD-10-CM | POA: Diagnosis not present

## 2020-03-30 DIAGNOSIS — R7881 Bacteremia: Secondary | ICD-10-CM | POA: Diagnosis not present

## 2020-03-30 DIAGNOSIS — B9561 Methicillin susceptible Staphylococcus aureus infection as the cause of diseases classified elsewhere: Secondary | ICD-10-CM | POA: Diagnosis not present

## 2020-03-30 LAB — GLUCOSE, CAPILLARY
Glucose-Capillary: 181 mg/dL — ABNORMAL HIGH (ref 70–99)
Glucose-Capillary: 115 mg/dL — ABNORMAL HIGH (ref 70–99)
Glucose-Capillary: 100 mg/dL — ABNORMAL HIGH (ref 70–99)
Glucose-Capillary: 84 mg/dL (ref 70–99)

## 2020-03-30 LAB — CBC WITH DIFFERENTIAL/PLATELET
Abs Immature Granulocytes: 0.16 10*3/uL — ABNORMAL HIGH (ref 0.00–0.07)
Basophils Absolute: 0 10*3/uL (ref 0.0–0.1)
Basophils Relative: 0 %
Eosinophils Absolute: 0 10*3/uL (ref 0.0–0.5)
Eosinophils Relative: 0 %
HCT: 40.4 % (ref 39.0–52.0)
Hemoglobin: 14.2 g/dL (ref 13.0–17.0)
Immature Granulocytes: 1 %
Lymphocytes Relative: 3 %
Lymphs Abs: 0.4 10*3/uL — ABNORMAL LOW (ref 0.7–4.0)
MCH: 30.5 pg (ref 26.0–34.0)
MCHC: 35.1 g/dL (ref 30.0–36.0)
MCV: 86.9 fL (ref 80.0–100.0)
Monocytes Absolute: 0.7 10*3/uL (ref 0.1–1.0)
Monocytes Relative: 5 %
Neutro Abs: 13.3 10*3/uL — ABNORMAL HIGH (ref 1.7–7.7)
Neutrophils Relative %: 91 %
Platelets: 200 10*3/uL (ref 150–400)
RBC: 4.65 MIL/uL (ref 4.22–5.81)
RDW: 13.2 % (ref 11.5–15.5)
WBC: 14.6 10*3/uL — ABNORMAL HIGH (ref 4.0–10.5)
nRBC: 0 % (ref 0.0–0.2)

## 2020-03-30 LAB — COMPREHENSIVE METABOLIC PANEL
ALT: 12 U/L (ref 0–44)
AST: 24 U/L (ref 15–41)
Albumin: 2.5 g/dL — ABNORMAL LOW (ref 3.5–5.0)
Alkaline Phosphatase: 54 U/L (ref 38–126)
Anion gap: 7 (ref 5–15)
BUN: 14 mg/dL (ref 8–23)
CO2: 28 mmol/L (ref 22–32)
Calcium: 7.4 mg/dL — ABNORMAL LOW (ref 8.9–10.3)
Chloride: 101 mmol/L (ref 98–111)
Creatinine, Ser: 0.46 mg/dL — ABNORMAL LOW (ref 0.61–1.24)
GFR, Estimated: >60 mL/min (ref >60)
Glucose, Bld: 161 mg/dL — ABNORMAL HIGH (ref 70–99)
Potassium: 3.5 mmol/L (ref 3.5–5.1)
Sodium: 136 mmol/L (ref 135–145)
Total Bilirubin: 1.1 mg/dL (ref 0.3–1.2)
Total Protein: 5.2 g/dL — ABNORMAL LOW (ref 6.5–8.1)

## 2020-03-30 LAB — C-REACTIVE PROTEIN: CRP: 0.8 mg/dL (ref <1.0)

## 2020-03-30 LAB — FIBRIN DERIVATIVES D-DIMER (ARMC ONLY): Fibrin derivatives D-dimer (ARMC): 729.68 ng/mL (FEU) — ABNORMAL HIGH (ref 0.00–499.00)

## 2020-03-30 MED ORDER — NYSTATIN 100000 UNIT/ML MT SUSP
5.0000 mL | Freq: Four times a day (QID) | OROMUCOSAL | Status: DC
  Administered 2020-03-30 – 2020-04-01 (×10): 500000 [IU] via ORAL
  Filled 2020-03-30 (×11): qty 5

## 2020-03-30 MED ORDER — FOLIC ACID 1 MG PO TABS
1.0000 mg | ORAL_TABLET | Freq: Every day | ORAL | Status: DC
  Administered 2020-03-31 – 2020-04-02 (×3): 1 mg via ORAL
  Filled 2020-03-30 (×3): qty 1

## 2020-03-30 MED ORDER — METOPROLOL TARTRATE 50 MG PO TABS
50.0000 mg | ORAL_TABLET | Freq: Two times a day (BID) | ORAL | Status: DC
  Administered 2020-03-30 – 2020-04-02 (×5): 50 mg via ORAL
  Filled 2020-03-30 (×6): qty 1

## 2020-03-30 MED ORDER — ROSUVASTATIN CALCIUM 10 MG PO TABS
5.0000 mg | ORAL_TABLET | Freq: Every day | ORAL | Status: DC
  Administered 2020-03-30 – 2020-04-02 (×4): 5 mg via ORAL
  Filled 2020-03-30 (×4): qty 1

## 2020-03-30 MED ORDER — ASPIRIN EC 81 MG PO TBEC
81.0000 mg | DELAYED_RELEASE_TABLET | Freq: Every day | ORAL | Status: DC
  Administered 2020-03-30 – 2020-04-01 (×3): 81 mg via ORAL
  Filled 2020-03-30 (×3): qty 1

## 2020-03-30 MED ORDER — LOSARTAN POTASSIUM 25 MG PO TABS
25.0000 mg | ORAL_TABLET | Freq: Every day | ORAL | Status: DC
Start: 2020-03-31 — End: 2020-04-02
  Administered 2020-03-31 – 2020-04-01 (×2): 25 mg via ORAL
  Filled 2020-03-30 (×2): qty 1

## 2020-03-30 NOTE — Progress Notes (Signed)
Progress Note  Patient Name: [name] Date of Encounter: 03/30/2020  [facility] Cardiologist: Ena Dawley, MD   Subjective   Not interviewed due to current Covid status.  Telemetry shows frequent PVCs, bigeminy, brief SVE's.  Inpatient Medications    Scheduled Meds: . vitamin C  500 mg Oral Daily  . chlorhexidine  15 mL Mouth Rinse BID  . Chlorhexidine Gluconate Cloth  6 each Topical Q0600  . cyanocobalamin  1,000 mcg Intramuscular Daily   Followed by  . [START ON 04/03/2020] vitamin B-12  1,000 mcg Oral Daily  . dexamethasone (DECADRON) injection  6 mg Intravenous Q24H  . enoxaparin (LOVENOX) injection  40 mg Subcutaneous Q24H  . feeding supplement (NEPRO CARB STEADY)  237 mL Oral TID BM  . [START ON 94/17/4081] folic acid  1 mg Oral Daily  . insulin aspart  0-5 Units Subcutaneous QHS  . insulin aspart  0-9 Units Subcutaneous TID WC  . linagliptin  5 mg Oral Daily  . mouth rinse  15 mL Mouth Rinse q12n4p  . metoprolol tartrate  50 mg Oral BID  . multivitamin with minerals  1 tablet Oral Daily  . mupirocin ointment  1 application Nasal BID  . nystatin  5 mL Oral QID  . thiamine injection  100 mg Intravenous Daily  . zinc sulfate  220 mg Oral Daily   Continuous Infusions: . sodium chloride Stopped (03/28/20 1506)  .  ceFAZolin (ANCEF) IV 2 g (03/30/20 0602)  . dextrose 5% lactated ringers 75 mL/hr at 03/30/20 0401  . folic acid (FOLVITE) IVPB 1 mg (03/29/20 1301)   PRN Meds: sodium chloride, acetaminophen **OR** acetaminophen, chlorpheniramine-HYDROcodone, guaiFENesin-dextromethorphan, levalbuterol, metoprolol tartrate, morphine injection, ondansetron **OR** ondansetron (ZOFRAN) IV   Vital Signs    Vitals:   03/30/20 0540 03/30/20 0652 03/30/20 0835 03/30/20 1013  BP: 125/74 135/85 (!) 151/67   Pulse: (!) 106 90 91   Resp:  20 (!) 28 20  Temp: 98.7 F (37.1 C) 98.5 F (36.9 C) 97.6 F (36.4 C)   TempSrc: Oral Oral Oral   SpO2: 98% 98%  94%   Weight: 62.8 kg     Height:        Intake/Output Summary (Last 24 hours) at 03/30/2020 1103 Last data filed at 03/30/2020 0950 Gross per 24 hour  Intake 917.97 ml  Output 2350 ml  Net -1432.03 ml   Last 3 Weights 03/30/2020 03/29/2020 03/28/2020  Weight (lbs) 138 lb 8 oz 138 lb 11.2 oz 137 lb 12.8 oz  Weight (kg) 62.823 kg 62.914 kg 62.506 kg  Some encounter information is confidential and restricted. Go to Review Flowsheets activity to see all data.      Telemetry    Sinus rhythm, frequent ectopy PVCs, SVE's.- Personally Reviewed  ECG    No new tracing- Personally Reviewed  Physical Exam   Not performed  Labs    High Sensitivity Troponin:  No results for input(s): TROPONINIHS in the last 720 hours.    Chemistry Recent Labs  Lab 03/28/20 0245 03/29/20 0617 03/30/20 0426  NA 146* 139 136  K 3.6 3.4* 3.5  CL 109 103 101  CO2 28 29 28   GLUCOSE 127* 203* 161*  BUN 25* 18 14  CREATININE 0.59* 0.50* 0.46*  CALCIUM 7.6* 7.3* 7.4*  PROT 5.3* 5.0* 5.2*  ALBUMIN 2.5* 2.4* 2.5*  AST 27 21 24   ALT 13 12 12   ALKPHOS 42 45 54  BILITOT 1.0 1.1 1.1  GFRNONAA >60 >60 >  60  ANIONGAP 9 7 7      Hematology Recent Labs  Lab 03/28/20 0245 03/29/20 0617 03/30/20 0426  WBC 11.9* 12.1* 14.6*  RBC 4.53 4.47 4.65  HGB 13.5 13.8 14.2  HCT 40.9 39.6 40.4  MCV 90.3 88.6 86.9  MCH 29.8 30.9 30.5  MCHC 33.0 34.8 35.1  RDW 13.3 13.2 13.2  PLT 203 212 200    BNP Recent Labs  Lab 03/27/20 0149  BNP 566.2*     DDimer No results for input(s): DDIMER in the last 168 hours.   Radiology    No results found.  Cardiac Studies   TTE 03/2020 1. Left ventricular ejection fraction, by estimation, is 40 to 45%. The  left ventricle has mildly decreased function. The left ventricle  demonstrates global hypokinesis. Left ventricular diastolic parameters are  indeterminate.  2. Right ventricular systolic function is normal. The right ventricular  size is normal.  Tricuspid regurgitation signal is inadequate for assessing  PA pressure.  3. Ectopy appreciated.   Patient Profile     79 y.o. male history of CAD/NSTEMI status post PCI/DES to OM1, SVT, tardive dyskinesia, Parkinson's disease, seizure disorder, hypertension, COPD being seen for intermittent SVTs in the setting of COVID-19 pneumonia  Assessment & Plan    1.  PSVT -Telemetry with frequent ectopy -Increase Lopressor to 50 mg twice daily -Transition to Toprol-XL when ectopy stabilizes.  2.  Heart failure reduced ejection fraction, EF 40 to 45% -Restart PTA losartan at low-dose 25 mg.  Titrate as BP permits. -Lopressor for now, Toprol-XL on discharge as mentioned above.  3. CAD/PCI/DES to OM1 -restart pta asa, crestor -BB as above  4.  COVID-19 pneumonia, -Management as per primary team  Total encounter time 35 minutes  Greater than 50% was spent in counseling and coordination of care with the patient     Signed, Kate Sable, MD  03/30/2020, 11:03 AM

## 2020-03-30 NOTE — Plan of Care (Signed)
  Problem: Clinical Measurements: Goal: Will remain free from infection Outcome: Progressing Goal: Respiratory complications will improve Outcome: Progressing   Problem: Pain Managment: Goal: General experience of comfort will improve Outcome: Progressing   Problem: Safety: Goal: Ability to remain free from injury will improve Outcome: Progressing   

## 2020-03-30 NOTE — Progress Notes (Signed)
CCMD called and reported pt just have 6 beats of vtach. Pt asymptomatic on assessment. VSS. Notify NP Randol Kern. Will continue to monitor.

## 2020-03-30 NOTE — Progress Notes (Signed)
PHARMACIST - PHYSICIAN COMMUNICATION   CONCERNING: IV to Oral Route Change Policy  RECOMMENDATION: This patient is receiving Folic Acid 1g by the intravenous route.  Based on criteria approved by the Pharmacy and Therapeutics Committee, the intravenous medication(s) is/are being converted to the equivalent oral dose form(s).   DESCRIPTION: These criteria include:  The patient is eating (either orally or via tube) and/or has been taking other orally administered medications for a least 24 hours  The patient has no evidence of active gastrointestinal bleeding or impaired GI absorption (gastrectomy, short bowel, patient on TNA or NPO).  THERE IS A NATIONAL SHORTAGE OF IV FOLIC ACID.   If you have questions about this conversion, please contact the [place], PharmD, BCPS Clinical Pharmacist 03/30/2020 10:36 AM

## 2020-03-30 NOTE — Progress Notes (Signed)
PROGRESS NOTE    [name]  IEP:[id number] DOB: 1940-12-09 DOA: 03/15/2020 PCP: Idelle Crouch, MD   Chief Complaint  Patient presents with  . Shortness of Breath    Brief Narrative:  [name] is [name] 79 y.o. male with medical history significant for HTN, COPD, seizures disorder, Parkinson's, CAD, with history of COVID-19 exposure from his wife who is currently hospitalized for Covid, who is brought in by EMS with [name] several day history of shortness of breath and cough, and who has been lying in bed, with poor oral intake, arriving unkempt and soiled and disoriented.  Patient is unable to contribute to history due to altered mental status.  On arrival he was afebrile at 97.2, BP 172/103, heart rate 95 respirations 40/min with O2 sat 84% on room air improving to 100% on 3 L.  While in the ER he went into rapid [name]. fib with heart rate as high as 159, with O2 sat dropping as low as 80% improving to 99% with 4 L.  Blood work returned with WBC of 12,000 and lactic acid 7.4, improving to 4 following 2 L fluid bolus.  Hemoglobin 18.3 suspect secondary to hemoconcentration.  CMP showed sodium of 152, BUN 45, creatinine 1.21.  D-dimer 1824.  Bilirubin 2.5.  Covid positive.  Chest x-ray showed right lower lobe infiltrate.  Patient treated with sepsis fluid bolus, IV antibiotics and also started on Decadron.  Hospitalist consulted for admission.     Assessment & Plan:   Active Problems:   SOB (shortness of breath)   Essential hypertension   COPD (chronic obstructive pulmonary disease) (HCC)   Seizure disorder (HCC)   Parkinson's variant of multiple system atrophy (HCC)   PSVT (paroxysmal supraventricular tachycardia) (HCC)   CAD (coronary artery disease)   Severe sepsis (HCC)   Pneumonia due to COVID-19 virus   Acute respiratory failure due to COVID-19 [facility])   Acute hypernatremia   Rapid atrial fibrillation, new onset ([place])   Acute metabolic encephalopathy   Bacterial  pneumonia   Sepsis ([name])   Goals of care, counseling/discussion   Palliative care by specialist   DNR (do not resuscitate)   MSSA bacteremia  Goals of care: DNR per son and pt's wife.  Overall prognosis is poor.  Palliative care following.  Severe sepsis/septic shock secondary to covid 19 viral pneumonia  Acute hypoxic respiratory failure  Superimposed Bacteria Community Acquired Pneumonia  MSSA Bacteremia - Patient presented with altered mental status, cough and shortness of breath, O2 sat 88% on room air, tachycardia, increased work of breathing requiring 4 L O2, leukocytosis, lactic acid 7.  Afebrile and not hypotensive - currently requiring 5 L [place] - CT chest with right lower lobe pneumonia - SLP eval for aspiration -  Dysphagia 1, nectar thick liquids -continue steroids and remdesivir.  Hold off on baricitinib due to bacteremia - 12/14 blood cx with staph aureus (mssa) - repeat cx from 12/16 NGTD x3 days - urine cx pending  - continue ancef per ID - ID c/s, appreciate recommendations - recommending ancef, echo (EF 40-45%, global hypokinesis, valves not well visualized), repeat blood cx -- maintain O2>88% -- lactate improved -- I/O, daily weights -- Prone as able, OOB, flutter, IS, therapy  COVID-19 Labs  Recent Labs    03/28/20 0245 03/28/20 0444 03/29/20 0617  FERRITIN 200  --   --   CRP  --  2.5* 1.2*    Lab Results  Component Value Date   SARSCOV2NAA  POSITIVE (Lum Stillinger) 64/33/2951   Acute metabolic encephalopathy -Secondary to acute infection and sepsis - [name]&Ox1 today, but [name] little more alert today - delirium precautions -- follow TSH (low follow free T4), vitamin b12 (pending), folate (low, supplement), ammonia (wnl) -- most concerning issue at this time is his oral intake which is minimal in setting of his acute illness/delirium - will need to follow closely as if this does not improve, may need to consider transition to comfort measures  Lactic Acidosis: suspect  this is 2/2 hypovolemia/dehydration/sepsis from above, pt still with hypernatremia, reports feeling thirsty, does not clinically appear overloaded on exam.  Follow with IVF.  Elevated BNP noted, though clinically not overloaded at this time (received lasix x1 overnight). - improved with IVF, follow  Supraventricular Tachycardia - appreciate cardiology assistance -> paroxysmal atrial tachycardia vs MAT  - metop PO 25 mg BID - prn metop for sustained HR > 120 - follow with IVF -Cardiology consult, appreciate recs --follow echo - notable for decreased EF, appreciate cards recs  HFrEF: does not appear volume overloaded. New onset, possibly 2/2 stress with covid infection and bacteremia.   Cardiology recs appreciated     Elevated D dimer: follow LE [place] (negative for DVT) and CT PE protocol (without PE)  Acute hypernatremia - resolved, follow with IVF  Abnormal TFT: as TSH not completely suppressed and repeat free T4 borderline elevated - suspect this is abnormal in setting of acute illness, follow   Folate Deficiency  B12 Deficiency: supplement    Essential hypertension -prn metop, BP ok  - metoprolol 25 mg BID --per med rec, not taking home meds    COPD (chronic obstructive pulmonary disease) (HCC) -Albuterol inhaler scheduled and as needed as per treatment of Covid    Parkinson's variant of multiple system atrophy (HCC) -Chronic and stable    CAD (coronary artery disease) -.  Stable -Continue aspirin, metoprolol, rosuvastatin and nitroglycerin (as above, per med rec, he wasn't taking home meds)  Med rec currently pending, follow with pharmacy (and family) - med rec looks like he's not taking   DVT prophylaxis: lovenox Code Status: full  Family Communication: none at bedside - son over phone Disposition:   Status is: Inpatient  Remains inpatient appropriate because:Inpatient level of care appropriate due to severity of illness   Dispo: The patient is from: Home               Anticipated d/c is to: pending              Anticipated d/c date is: > 3 days              Patient currently is not medically stable to d/c.       Consultants:   none  Procedures:  none  Antimicrobials: Anti-infectives (From admission, onward)   Start     Dose/Rate Route Frequency Ordered Stop   03/28/20 1000  cefTRIAXone (ROCEPHIN) 2 g in sodium chloride 0.9 % 100 mL IVPB  Status:  Discontinued        2 g 200 mL/hr over 30 Minutes Intravenous Every 24 hours 03/27/20 0827 03/27/20 1557   03/27/20 2000  ceFAZolin (ANCEF) IVPB 2g/100 mL premix        2 g 200 mL/hr over 30 Minutes Intravenous Every 8 hours 03/27/20 1559     03/27/20 1000  remdesivir 100 mg in sodium chloride 0.9 % 100 mL IVPB       "Followed by" Linked Group Details  100 mg 200 mL/hr over 30 Minutes Intravenous Daily 03/26/20 0146 03/30/20 1015   03/27/20 0930  cefTRIAXone (ROCEPHIN) 1 g in sodium chloride 0.9 % 100 mL IVPB        1 g 200 mL/hr over 30 Minutes Intravenous  Once 03/27/20 0830 03/27/20 1124   03/26/20 2300  azithromycin (ZITHROMAX) 500 mg in sodium chloride 0.9 % 250 mL IVPB  Status:  Discontinued        500 mg 250 mL/hr over 60 Minutes Intravenous Every 24 hours 03/26/20 0218 03/26/20 1448   03/26/20 2200  cefTRIAXone (ROCEPHIN) 1 g in sodium chloride 0.9 % 100 mL IVPB  Status:  Discontinued        1 g 200 mL/hr over 30 Minutes Intravenous Every 24 hours 03/26/20 2053 03/27/20 0826   03/26/20 2200  azithromycin (ZITHROMAX) 500 mg in sodium chloride 0.9 % 250 mL IVPB  Status:  Discontinued        500 mg 250 mL/hr over 60 Minutes Intravenous Every 24 hours 03/26/20 2053 03/27/20 1559   03/26/20 0300  cefTRIAXone (ROCEPHIN) 1 g in sodium chloride 0.9 % 100 mL IVPB  Status:  Discontinued        1 g 200 mL/hr over 30 Minutes Intravenous Every 24 hours 03/26/20 0218 03/26/20 1448   03/26/20 0145  remdesivir 200 mg in sodium chloride 0.9% 250 mL IVPB       "Followed by" Linked Group Details    200 mg 580 mL/hr over 30 Minutes Intravenous Once 03/26/20 0146 03/26/20 0556   03/29/2020 2245  levofloxacin (LEVAQUIN) IVPB 750 mg        750 mg 100 mL/hr over 90 Minutes Intravenous  Once 03/16/2020 2244 03/26/20 0039         Subjective: Rainee Sweatt&OX1, denies pain or discomfort  Objective: Vitals:   03/30/20 0540 03/30/20 0652 03/30/20 0835 03/30/20 1013  BP: 125/74 135/85 (!) 151/67   Pulse: (!) 106 90 91   Resp:  20 (!) 28 20  Temp: 98.7 F (37.1 C) 98.5 F (36.9 C) 97.6 F (36.4 C)   TempSrc: Oral Oral Oral   SpO2: 98% 98% 94%   Weight: 62.8 kg     Height:        Intake/Output Summary (Last 24 hours) at 03/30/2020 1034 Last data filed at 03/30/2020 0950 Gross per 24 hour  Intake 917.97 ml  Output 2350 ml  Net -1432.03 ml   Filed Weights   03/28/20 0304 03/29/20 0401 03/30/20 0540  Weight: 62.5 kg 62.9 kg 62.8 kg    Examination:  General: No acute distress. Cachetic, ill appearing Cardiovascular: Heart sounds show [name] regular rate, and rhythm. . Lungs: diminished Abdomen: Soft, nontender, nondistended Neurological: Alert and oriented 1. Moves all extremities 4. Cranial nerves II through XII grossly intact. Skin: Warm and dry. No rashes or lesions. Extremities: No clubbing or cyanosis. No edema.   Data Reviewed: I have personally reviewed following labs and imaging studies  CBC: Recent Labs  Lab 03/24/2020 2158 03/26/20 0507 03/27/20 0149 03/28/20 0245 03/29/20 0617 03/30/20 0426  WBC 12.2* 8.0 11.0* 11.9* 12.1* 14.6*  NEUTROABS 10.1*  --  9.3* 10.1* 11.0* 13.3*  HGB 18.3* 14.5 13.8 13.5 13.8 14.2  HCT 55.2* 43.7 40.9 40.9 39.6 40.4  MCV 90.2 90.3 91.5 90.3 88.6 86.9  PLT 302 201 182 203 212 237    Basic Metabolic Panel: Recent Labs  Lab 03/26/20 1030 03/27/20 0149 03/27/20 6283 03/28/20 0245 03/29/20 0617 03/30/20 0426  NA 148* 148*  --  146* 139 136  K 4.3 3.6  --  3.6 3.4* 3.5  CL 112* 113*  --  109 103 101  CO2 26 27  --  28 29 28    GLUCOSE 119* 88  --  127* 203* 161*  BUN 34* 28*  --  25* 18 14  CREATININE 0.70 0.68  --  0.59* 0.50* 0.46*  CALCIUM 7.7* 7.7*  --  7.6* 7.3* 7.4*  MG  --   --  2.0 2.1  --   --   PHOS  --   --   --  2.4*  --   --     GFR: Estimated Creatinine Clearance: 68.7 mL/min ([name]) (by C-G formula based on SCr of 0.46 mg/dL (L)).  Liver Function Tests: Recent Labs  Lab 03/26/20 1030 03/27/20 0149 03/28/20 0245 03/29/20 0617 03/30/20 0426  AST 28 26 27 21 24   ALT 10 11 13 12 12   ALKPHOS 49 47 42 45 54  BILITOT 1.4* 1.0 1.0 1.1 1.1  PROT 5.6* 5.3* 5.3* 5.0* 5.2*  ALBUMIN 2.5* 2.3* 2.5* 2.4* 2.5*    CBG: Recent Labs  Lab 03/29/20 0910 03/29/20 1240 03/29/20 1743 03/29/20 2135 03/30/20 0840  GLUCAP 223* 154* 117* 108* 181*     Recent Results (from the past 240 hour(s))  Blood culture (routine single)     Status: Abnormal   Collection Time: 04/05/2020  9:58 PM   Specimen: BLOOD  Result Value Ref Range Status   Specimen Description   Final    BLOOD BLOOD RIGHT FOREARM Performed at [facility], [address] [place], [place] [address]    Special Requests   Final    BOTTLES DRAWN AEROBIC AND ANAEROBIC Blood Culture adequate volume Performed at [facility], [address] [place], [place] [address]    Culture  Setup Time   Final    GRAM POSITIVE COCCI ANAEROBIC BOTTLE ONLY Organism ID to follow CRITICAL RESULT CALLED TO, READ BACK BY AND VERIFIED WITH: JASON ROBBINS AT 0539 03/27/20 Mutual Performed at [facility], [address] [place], [place] [address]    Culture STAPHYLOCOCCUS AUREUS ([name])  Final   Report Status 03/29/2020 FINAL  Final   Organism ID, Bacteria STAPHYLOCOCCUS AUREUS  Final      Susceptibility   Staphylococcus aureus - MIC*    CIPROFLOXACIN <=0.5 SENSITIVE Sensitive     ERYTHROMYCIN <=0.25 SENSITIVE Sensitive     GENTAMICIN <=0.5 SENSITIVE Sensitive     OXACILLIN 0.5 SENSITIVE Sensitive     TETRACYCLINE <=1 SENSITIVE  Sensitive     VANCOMYCIN 1 SENSITIVE Sensitive     TRIMETH/SULFA <=10 SENSITIVE Sensitive     CLINDAMYCIN <=0.25 SENSITIVE Sensitive     RIFAMPIN <=0.5 SENSITIVE Sensitive     Inducible Clindamycin NEGATIVE Sensitive     * STAPHYLOCOCCUS AUREUS  Resp Panel by RT-PCR (Flu Shyne Lehrke&B, Covid) Nasopharyngeal Swab     Status: Abnormal   Collection Time: 03/20/2020  9:58 PM   Specimen: Nasopharyngeal Swab; Nasopharyngeal(NP) swabs in vial transport medium  Result Value Ref Range Status   SARS Coronavirus 2 by RT PCR POSITIVE ([name]) NEGATIVE Final    Comment: RESULT CALLED TO, READ BACK BY AND VERIFIED WITH: JEN AGNEW RN 2324 03/28/2020 (NOTE) SARS-CoV-2 target nucleic acids are DETECTED.  The SARS-CoV-2 RNA is generally detectable in upper respiratory specimens during the acute phase of infection. Positive results are indicative of the presence of the identified virus, but  do not rule out bacterial infection or co-infection with other pathogens not detected by the test. Clinical correlation with patient history and other diagnostic information is necessary to determine patient infection status. The expected result is Negative.  Fact Sheet for Patients: EntrepreneurPulse.com.au  Fact Sheet for Healthcare Providers: IncredibleEmployment.be  This test is not yet approved or cleared by the [place] FDA and  has been authorized for detection and/or diagnosis of SARS-CoV-2 by FDA under an Emergency Use Authorization (EUA).  This EUA will remain in effect (meaning this test can be used)  for the duration of  the COVID-19 declaration under Section 564(b)(1) of the Act, 21 U.S.C. section 360bbb-3(b)(1), unless the authorization is terminated or revoked sooner.     Influenza Rainier Feuerborn by PCR NEGATIVE NEGATIVE Final   Influenza B by PCR NEGATIVE NEGATIVE Final    Comment: (NOTE) The Xpert Xpress SARS-CoV-2/FLU/RSV plus assay is intended as an aid in the diagnosis of  influenza from Nasopharyngeal swab specimens and should not be used as [name] sole basis for treatment. Nasal washings and aspirates are unacceptable for Xpert Xpress SARS-CoV-2/FLU/RSV testing.  Fact Sheet for Patients: EntrepreneurPulse.com.au  Fact Sheet for Healthcare Providers: IncredibleEmployment.be  This test is not yet approved or cleared by the [place] FDA and has been authorized for detection and/or diagnosis of SARS-CoV-2 by FDA under an Emergency Use Authorization (EUA). This EUA will remain in effect (meaning this test can be used) for the duration of the COVID-19 declaration under Section 564(b)(1) of the Act, 21 U.S.C. section 360bbb-3(b)(1), unless the authorization is terminated or revoked.  Performed at [facility], [address] [place], [place] [address]   Blood Culture ID Panel (Reflexed)     Status: Abnormal   Collection Time: 04/08/2020  9:58 PM  Result Value Ref Range Status   Enterococcus faecalis NOT DETECTED NOT DETECTED Final   Enterococcus Faecium NOT DETECTED NOT DETECTED Final   Listeria monocytogenes NOT DETECTED NOT DETECTED Final   Staphylococcus species DETECTED ([name]) NOT DETECTED Final    Comment: CRITICAL RESULT CALLED TO, READ BACK BY AND VERIFIED WITH:  JASON ROBBINS AT 0509 03/27/20 SDR    Staphylococcus aureus (BCID) DETECTED ([name]) NOT DETECTED Final    Comment: CRITICAL RESULT CALLED TO, READ BACK BY AND VERIFIED WITH:  JASON ROBBINS AT 0509 03/27/20 SDR    Staphylococcus epidermidis NOT DETECTED NOT DETECTED Final   Staphylococcus lugdunensis NOT DETECTED NOT DETECTED Final   Streptococcus species NOT DETECTED NOT DETECTED Final   Streptococcus agalactiae NOT DETECTED NOT DETECTED Final   Streptococcus pneumoniae NOT DETECTED NOT DETECTED Final   Streptococcus pyogenes NOT DETECTED NOT DETECTED Final   Krystal Teachey.calcoaceticus-baumannii NOT DETECTED NOT DETECTED Final   Bacteroides fragilis NOT  DETECTED NOT DETECTED Final   Enterobacterales NOT DETECTED NOT DETECTED Final   Enterobacter cloacae complex NOT DETECTED NOT DETECTED Final   Escherichia coli NOT DETECTED NOT DETECTED Final   Klebsiella aerogenes NOT DETECTED NOT DETECTED Final   Klebsiella oxytoca NOT DETECTED NOT DETECTED Final   Klebsiella pneumoniae NOT DETECTED NOT DETECTED Final   Proteus species NOT DETECTED NOT DETECTED Final   Salmonella species NOT DETECTED NOT DETECTED Final   Serratia marcescens NOT DETECTED NOT DETECTED Final   Haemophilus influenzae NOT DETECTED NOT DETECTED Final   Neisseria meningitidis NOT DETECTED NOT DETECTED Final   Pseudomonas aeruginosa NOT DETECTED NOT DETECTED Final   Stenotrophomonas maltophilia NOT DETECTED NOT DETECTED Final   Candida albicans NOT DETECTED NOT DETECTED Final  Candida auris NOT DETECTED NOT DETECTED Final   Candida glabrata NOT DETECTED NOT DETECTED Final   Candida krusei NOT DETECTED NOT DETECTED Final   Candida parapsilosis NOT DETECTED NOT DETECTED Final   Candida tropicalis NOT DETECTED NOT DETECTED Final   Cryptococcus neoformans/gattii NOT DETECTED NOT DETECTED Final   Meth resistant mecA/C and MREJ NOT DETECTED NOT DETECTED Final    Comment: Performed at [facility], [address] [place], [place] [address]  CULTURE, BLOOD (ROUTINE X 2) w Reflex to ID Panel     Status: None (Preliminary result)   Collection Time: 03/27/20  7:33 AM   Specimen: BLOOD  Result Value Ref Range Status   Specimen Description BLOOD LEFT HAND  Final   Special Requests   Final    BOTTLES DRAWN AEROBIC AND ANAEROBIC Blood Culture results may not be optimal due to an excessive volume of blood received in culture bottles   Culture   Final    NO GROWTH 3 DAYS Performed at [facility], [address] [place], [place] [address]    Report Status PENDING  Incomplete  CULTURE, BLOOD (ROUTINE X 2) w Reflex to ID Panel     Status: None (Preliminary result)    Collection Time: 03/27/20  7:35 AM   Specimen: BLOOD  Result Value Ref Range Status   Specimen Description BLOOD RIGHT HAND  Final   Special Requests   Final    BOTTLES DRAWN AEROBIC AND ANAEROBIC Blood Culture results may not be optimal due to an excessive volume of blood received in culture bottles   Culture   Final    NO GROWTH 3 DAYS Performed at [facility], [address] [place], [place] [address]    Report Status PENDING  Incomplete  MRSA PCR Screening     Status: Abnormal   Collection Time: 03/27/20 12:00 PM   Specimen: Nasopharyngeal  Result Value Ref Range Status   MRSA by PCR POSITIVE ([name]) NEGATIVE Final    Comment:        The GeneXpert MRSA Assay (FDA approved for NASAL specimens only), is one component of [name] comprehensive MRSA colonization surveillance program. It is not intended to diagnose MRSA infection nor to guide or monitor treatment for MRSA infections. RESULT CALLED TO, READ BACK BY AND VERIFIED WITHMaryland Pink AT 2876 03/27/20 SDR Performed at [facility], [address] [place], [place] [address]   Urine culture     Status: None   Collection Time: 03/27/20 12:08 PM   Specimen: In/Out Cath Urine  Result Value Ref Range Status   Specimen Description   Final    IN/OUT CATH URINE Performed at [facility], [address], [place], [place] [address]    Special Requests   Final    NONE Performed at [facility], [address], [place], [place] [address]    Culture   Final    NO GROWTH Performed at [facility], [place] [address] [place], [place] [address]    Report Status 03/29/2020 FINAL  Final         Radiology Studies: No results found.      Scheduled Meds: . vitamin C  500 mg Oral Daily  . chlorhexidine  15 mL Mouth Rinse BID  . Chlorhexidine Gluconate Cloth  6 each Topical Q0600  . cyanocobalamin  1,000 mcg Intramuscular Daily   Followed by  . [START ON 04/03/2020]  vitamin B-12  1,000 mcg Oral  Daily  . dexamethasone (DECADRON) injection  6 mg Intravenous Q24H  . enoxaparin (LOVENOX) injection  40 mg Subcutaneous Q24H  . feeding supplement (NEPRO CARB STEADY)  237 mL Oral TID BM  . insulin aspart  0-5 Units Subcutaneous QHS  . insulin aspart  0-9 Units Subcutaneous TID WC  . linagliptin  5 mg Oral Daily  . mouth rinse  15 mL Mouth Rinse q12n4p  . metoprolol tartrate  50 mg Oral BID  . multivitamin with minerals  1 tablet Oral Daily  . mupirocin ointment  1 application Nasal BID  . thiamine injection  100 mg Intravenous Daily  . zinc sulfate  220 mg Oral Daily   Continuous Infusions: . sodium chloride Stopped (03/28/20 1506)  .  ceFAZolin (ANCEF) IV 2 g (03/30/20 0602)  . dextrose 5% lactated ringers 75 mL/hr at 03/30/20 0401  . folic acid (FOLVITE) IVPB 1 mg (03/29/20 1301)     LOS: 4 days    Time spent: over 30 min    Fayrene Helper, MD [facility]   To contact the attending provider between 7A-7P or the covering provider during after hours 7P-7A, please log into the web site [email or web address] and access using universal [place] password for that web site. If you do not have the password, please call the hospital operator.  03/30/2020, 10:34 AM

## 2020-03-31 DIAGNOSIS — D72829 Elevated white blood cell count, unspecified: Secondary | ICD-10-CM

## 2020-03-31 DIAGNOSIS — R0602 Shortness of breath: Secondary | ICD-10-CM | POA: Diagnosis not present

## 2020-03-31 DIAGNOSIS — U071 COVID-19: Secondary | ICD-10-CM | POA: Diagnosis not present

## 2020-03-31 DIAGNOSIS — J1281 Pneumonia due to SARS-associated coronavirus: Secondary | ICD-10-CM

## 2020-03-31 DIAGNOSIS — G9340 Encephalopathy, unspecified: Secondary | ICD-10-CM

## 2020-03-31 LAB — CBC WITH DIFFERENTIAL/PLATELET
Abs Immature Granulocytes: 0.14 10*3/uL — ABNORMAL HIGH (ref 0.00–0.07)
Basophils Absolute: 0 10*3/uL (ref 0.0–0.1)
Basophils Relative: 0 %
Eosinophils Absolute: 0 10*3/uL (ref 0.0–0.5)
Eosinophils Relative: 0 %
HCT: 40.1 % (ref 39.0–52.0)
Hemoglobin: 14 g/dL (ref 13.0–17.0)
Immature Granulocytes: 1 %
Lymphocytes Relative: 3 %
Lymphs Abs: 0.5 10*3/uL — ABNORMAL LOW (ref 0.7–4.0)
MCH: 29.8 pg (ref 26.0–34.0)
MCHC: 34.9 g/dL (ref 30.0–36.0)
MCV: 85.3 fL (ref 80.0–100.0)
Monocytes Absolute: 0.9 10*3/uL (ref 0.1–1.0)
Monocytes Relative: 6 %
Neutro Abs: 13.6 10*3/uL — ABNORMAL HIGH (ref 1.7–7.7)
Neutrophils Relative %: 90 %
Platelets: 224 10*3/uL (ref 150–400)
RBC: 4.7 MIL/uL (ref 4.22–5.81)
RDW: 13.2 % (ref 11.5–15.5)
WBC: 15.1 10*3/uL — ABNORMAL HIGH (ref 4.0–10.5)
nRBC: 0 % (ref 0.0–0.2)

## 2020-03-31 LAB — COMPREHENSIVE METABOLIC PANEL
ALT: 14 U/L (ref 0–44)
AST: 26 U/L (ref 15–41)
Albumin: 2.3 g/dL — ABNORMAL LOW (ref 3.5–5.0)
Alkaline Phosphatase: 60 U/L (ref 38–126)
Anion gap: 10 (ref 5–15)
BUN: 20 mg/dL (ref 8–23)
CO2: 26 mmol/L (ref 22–32)
Calcium: 7.5 mg/dL — ABNORMAL LOW (ref 8.9–10.3)
Chloride: 100 mmol/L (ref 98–111)
Creatinine, Ser: 0.52 mg/dL — ABNORMAL LOW (ref 0.61–1.24)
GFR, Estimated: >60 mL/min (ref >60)
Glucose, Bld: 121 mg/dL — ABNORMAL HIGH (ref 70–99)
Potassium: 3.6 mmol/L (ref 3.5–5.1)
Sodium: 136 mmol/L (ref 135–145)
Total Bilirubin: 1.3 mg/dL — ABNORMAL HIGH (ref 0.3–1.2)
Total Protein: 4.9 g/dL — ABNORMAL LOW (ref 6.5–8.1)

## 2020-03-31 LAB — GLUCOSE, CAPILLARY
Glucose-Capillary: 131 mg/dL — ABNORMAL HIGH (ref 70–99)
Glucose-Capillary: 112 mg/dL — ABNORMAL HIGH (ref 70–99)
Glucose-Capillary: 138 mg/dL — ABNORMAL HIGH (ref 70–99)
Glucose-Capillary: 101 mg/dL — ABNORMAL HIGH (ref 70–99)
Glucose-Capillary: 121 mg/dL — ABNORMAL HIGH (ref 70–99)

## 2020-03-31 LAB — FIBRIN DERIVATIVES D-DIMER (ARMC ONLY): Fibrin derivatives D-dimer (ARMC): 555.83 ng/mL (FEU) — ABNORMAL HIGH (ref 0.00–499.00)

## 2020-03-31 LAB — C-REACTIVE PROTEIN: CRP: 0.7 mg/dL (ref <1.0)

## 2020-03-31 MED ORDER — DEXTROSE IN LACTATED RINGERS 5 % IV SOLN: INTRAVENOUS | Status: AC

## 2020-03-31 NOTE — Progress Notes (Signed)
PROGRESS NOTE    [name]  RXV:[id number] DOB: 08/26/1940 DOA: 04/09/2020 PCP: Idelle Crouch, MD   Chief Complaint  Patient presents with  . Shortness of Breath    Brief Narrative:  [name] is [name] 79 y.o. male with medical history significant for HTN, COPD, seizures disorder, Parkinson's, CAD, with history of COVID-19 exposure from his wife who is currently hospitalized for Covid, who is brought in by EMS with [name] several day history of shortness of breath and cough, and who has been lying in bed, with poor oral intake, arriving unkempt and soiled and disoriented.  Patient is unable to contribute to history due to altered mental status.  On arrival he was afebrile at 97.2, BP 172/103, heart rate 95 respirations 40/min with O2 sat 84% on room air improving to 100% on 3 L.  While in the ER he went into rapid Lennart Gladish. fib with heart rate as high as 159, with O2 sat dropping as low as 80% improving to 99% with 4 L.  Blood work returned with WBC of 12,000 and lactic acid 7.4, improving to 4 following 2 L fluid bolus.  Hemoglobin 18.3 suspect secondary to hemoconcentration.  CMP showed sodium of 152, BUN 45, creatinine 1.21.  D-dimer 1824.  Bilirubin 2.5.  Covid positive.  Chest x-ray showed right lower lobe infiltrate.  Patient treated with sepsis fluid bolus, IV antibiotics and also started on Decadron.  Hospitalist consulted for admission.     Assessment & Plan:   Active Problems:   SOB (shortness of breath)   Essential hypertension   COPD (chronic obstructive pulmonary disease) (HCC)   Seizure disorder (HCC)   Parkinson's variant of multiple system atrophy (HCC)   PSVT (paroxysmal supraventricular tachycardia) (HCC)   CAD (coronary artery disease)   Severe sepsis (HCC)   Pneumonia due to COVID-19 virus   Acute respiratory failure due to COVID-19 [facility])   Acute hypernatremia   Rapid atrial fibrillation, new onset ([place])   Acute metabolic encephalopathy   Bacterial  pneumonia   Sepsis ([place])   Goals of care, counseling/discussion   Palliative care by specialist   DNR (do not resuscitate)   MSSA bacteremia  Goals of care: DNR per son and pt's wife.  Overall prognosis is poor.  Palliative care following.  He's not eating, taking minimal PO.  Discussed with son, he's got poor intake - likely related to covid, delirium, MSSA bacteremia - currently addressing these as below - but his prognosis is poor.  If his PO intake does not improve, discussed that comfort measures and hospice may be most appropriate.  He's going to talk to his siblings, he's going to consider options, including feeding tube.    Severe sepsis/septic shock secondary to covid 19 viral pneumonia  Acute hypoxic respiratory failure  Superimposed Bacteria Community Acquired Pneumonia  MSSA Bacteremia - Patient presented with altered mental status, cough and shortness of breath, O2 sat 88% on room air, tachycardia, increased work of breathing requiring 4 L O2, leukocytosis, lactic acid 7.  Afebrile and not hypotensive - currently on room air - CT chest with right lower lobe pneumonia - SLP eval for aspiration -  Dysphagia 1, nectar thick liquids - he's completed remdesivir.  With improvement in oxygen status and bacteremia, will d/c steroids. - 12/14 blood cx with staph aureus (mssa) - repeat cx from 12/16 NGTD x3 days - urine cx NG - continue ancef per ID - ID c/s, appreciate recommendations - recommending ancef, echo (  EF 40-45%, global hypokinesis, valves not well visualized), repeat blood cx -- maintain O2>88% -- lactate improved -- I/O, daily weights -- Prone as able, OOB, flutter, IS, therapy  COVID-19 Labs  Recent Labs    03/29/20 0617 03/30/20 0427 03/31/20 0417  CRP 1.2* 0.8 0.7    Lab Results  Component Value Date   SARSCOV2NAA POSITIVE (Jumar Greenstreet) 50/38/8828   Acute metabolic encephalopathy -Secondary to acute infection and sepsis  - delirium precautions  -- follow TSH (low  follow free T4), vitamin b12 (pending), folate (low, supplement), ammonia (wnl)  Lactic Acidosis: suspect this is 2/2 hypovolemia/dehydration/sepsis from above, pt still with hypernatremia, reports feeling thirsty, does not clinically appear overloaded on exam.  Follow with IVF.  Elevated BNP noted, though clinically not overloaded at this time (received lasix x1 overnight). - improved with IVF, follow  Supraventricular Tachycardia - appreciate cardiology assistance -> paroxysmal atrial tachycardia vs MAT  - metop PO 25 mg BID - prn metop for sustained HR > 120 - follow with IVF -Cardiology consult, appreciate recs --follow echo - notable for decreased EF, appreciate cards recs  HFrEF: does not appear volume overloaded. New onset, possibly 2/2 stress with covid infection and bacteremia.   Cardiology recs appreciated     Elevated D dimer: follow LE [place] (negative for DVT) and CT PE protocol (without PE)  Acute hypernatremia - resolved, follow with IVF  Abnormal TFT: as TSH not completely suppressed and repeat free T4 borderline elevated - suspect this is abnormal in setting of acute illness, follow   Folate Deficiency  B12 Deficiency: supplement    Essential hypertension -prn metop, BP ok  - metoprolol 50 mg BID, losartan 25 --per med rec, not taking home meds    COPD (chronic obstructive pulmonary disease) ([place]) -Albuterol inhaler scheduled and as needed as per treatment of Covid    Parkinson's variant of multiple system atrophy (HCC) -Chronic and stable    CAD (coronary artery disease) -.  Stable -Continue aspirin, metoprolol, rosuvastatin   DVT prophylaxis: lovenox Code Status: full  Family Communication: none at bedside - son over phone 12/20 Disposition:   Status is: Inpatient  Remains inpatient appropriate because:Inpatient level of care appropriate due to severity of illness   Dispo: The patient is from: Home              Anticipated d/c is to: pending               Anticipated d/c date is: > 3 days              Patient currently is not medically stable to d/c.       Consultants:   none  Procedures:  none  Antimicrobials: Anti-infectives (From admission, onward)   Start     Dose/Rate Route Frequency Ordered Stop   03/28/20 1000  cefTRIAXone (ROCEPHIN) 2 g in sodium chloride 0.9 % 100 mL IVPB  Status:  Discontinued        2 g 200 mL/hr over 30 Minutes Intravenous Every 24 hours 03/27/20 0827 03/27/20 1557   03/27/20 2000  ceFAZolin (ANCEF) IVPB 2g/100 mL premix        2 g 200 mL/hr over 30 Minutes Intravenous Every 8 hours 03/27/20 1559     03/27/20 1000  remdesivir 100 mg in sodium chloride 0.9 % 100 mL IVPB       "Followed by" Linked Group Details   100 mg 200 mL/hr over 30 Minutes Intravenous Daily 03/26/20 0146 03/30/20 1015  03/27/20 0930  cefTRIAXone (ROCEPHIN) 1 g in sodium chloride 0.9 % 100 mL IVPB        1 g 200 mL/hr over 30 Minutes Intravenous  Once 03/27/20 0830 03/27/20 1124   03/26/20 2300  azithromycin (ZITHROMAX) 500 mg in sodium chloride 0.9 % 250 mL IVPB  Status:  Discontinued        500 mg 250 mL/hr over 60 Minutes Intravenous Every 24 hours 03/26/20 0218 03/26/20 1448   03/26/20 2200  cefTRIAXone (ROCEPHIN) 1 g in sodium chloride 0.9 % 100 mL IVPB  Status:  Discontinued        1 g 200 mL/hr over 30 Minutes Intravenous Every 24 hours 03/26/20 2053 03/27/20 0826   03/26/20 2200  azithromycin (ZITHROMAX) 500 mg in sodium chloride 0.9 % 250 mL IVPB  Status:  Discontinued        500 mg 250 mL/hr over 60 Minutes Intravenous Every 24 hours 03/26/20 2053 03/27/20 1559   03/26/20 0300  cefTRIAXone (ROCEPHIN) 1 g in sodium chloride 0.9 % 100 mL IVPB  Status:  Discontinued        1 g 200 mL/hr over 30 Minutes Intravenous Every 24 hours 03/26/20 0218 03/26/20 1448   03/26/20 0145  remdesivir 200 mg in sodium chloride 0.9% 250 mL IVPB       "Followed by" Linked Group Details   200 mg 580 mL/hr over 30 Minutes  Intravenous Once 03/26/20 0146 03/26/20 0556   03/20/2020 2245  levofloxacin (LEVAQUIN) IVPB 750 mg        750 mg 100 mL/hr over 90 Minutes Intravenous  Once 03/13/2020 2244 03/26/20 0039         Subjective: Lekeisha Arenas&Ox2 today No new complaints - persistently confused  Objective: Vitals:   03/31/20 0424 03/31/20 0500 03/31/20 0936 03/31/20 1145  BP: 131/61  109/74 106/65  Pulse: 81   64  Resp: 18   16  Temp: 97.9 F (36.6 C)  97.9 F (36.6 C) 97.8 F (36.6 C)  TempSrc:   Oral   SpO2: 95%  99% 98%  Weight:  62.7 kg    Height:        Intake/Output Summary (Last 24 hours) at 03/31/2020 1509 Last data filed at 03/31/2020 0500 Gross per 24 hour  Intake 210 ml  Output 1050 ml  Net -840 ml   Filed Weights   03/29/20 0401 03/30/20 0540 03/31/20 0500  Weight: 62.9 kg 62.8 kg 62.7 kg    Examination:  General: No acute distress. Cachetic, chronically ill appearing Cardiovascular: Heart sounds show Adali Pennings regular rate, and rhythm Lungs: Clear to auscultation bilaterally Abdomen: Soft, nontender, nondistended  Neurological: Alert and oriented 2. Moves all extremities 4. Cranial nerves II through XII grossly intact. Skin: Warm and dry. No rashes or lesions. Extremities: No clubbing or cyanosis. No edema.   Data Reviewed: I have personally reviewed following labs and imaging studies  CBC: Recent Labs  Lab 03/27/20 0149 03/28/20 0245 03/29/20 0617 03/30/20 0426 03/31/20 0417  WBC 11.0* 11.9* 12.1* 14.6* 15.1*  NEUTROABS 9.3* 10.1* 11.0* 13.3* 13.6*  HGB 13.8 13.5 13.8 14.2 14.0  HCT 40.9 40.9 39.6 40.4 40.1  MCV 91.5 90.3 88.6 86.9 85.3  PLT 182 203 212 200 425    Basic Metabolic Panel: Recent Labs  Lab 03/27/20 0149 03/27/20 0659 03/28/20 0245 03/29/20 0617 03/30/20 0426 03/31/20 0417  NA 148*  --  146* 139 136 136  K 3.6  --  3.6 3.4* 3.5 3.6  CL  113*  --  109 103 101 100  CO2 27  --  28 29 28 26   GLUCOSE 88  --  127* 203* 161* 121*  BUN 28*  --  25* 18 14 20    CREATININE 0.68  --  0.59* 0.50* 0.46* 0.52*  CALCIUM 7.7*  --  7.6* 7.3* 7.4* 7.5*  MG  --  2.0 2.1  --   --   --   PHOS  --   --  2.4*  --   --   --     GFR: Estimated Creatinine Clearance: 68.6 mL/min (Emanual Lamountain) (by C-G formula based on SCr of 0.52 mg/dL (L)).  Liver Function Tests: Recent Labs  Lab 03/27/20 0149 03/28/20 0245 03/29/20 0617 03/30/20 0426 03/31/20 0417  AST 26 27 21 24 26   ALT 11 13 12 12 14   ALKPHOS 47 42 45 54 60  BILITOT 1.0 1.0 1.1 1.1 1.3*  PROT 5.3* 5.3* 5.0* 5.2* 4.9*  ALBUMIN 2.3* 2.5* 2.4* 2.5* 2.3*    CBG: Recent Labs  Lab 03/30/20 1725 03/30/20 2128 03/31/20 0536 03/31/20 0949 03/31/20 1151  GLUCAP 100* 84 131* 112* 138*     Recent Results (from the past 240 hour(s))  Blood culture (routine single)     Status: Abnormal   Collection Time: 03/27/2020  9:58 PM   Specimen: BLOOD  Result Value Ref Range Status   Specimen Description   Final    BLOOD BLOOD RIGHT FOREARM Performed at [facility], [address], [place], [place] [address]    Special Requests   Final    BOTTLES DRAWN AEROBIC AND ANAEROBIC Blood Culture adequate volume Performed at [facility], [address] [place], [place] [address]    Culture  Setup Time   Final    GRAM POSITIVE COCCI ANAEROBIC BOTTLE ONLY Organism ID to follow CRITICAL RESULT CALLED TO, READ BACK BY AND VERIFIED WITH: JASON ROBBINS AT 4970 03/27/20 [place] Performed at [facility], [address] [place], [place] [address]    Culture STAPHYLOCOCCUS AUREUS (Bettie Swavely)  Final   Report Status 03/29/2020 FINAL  Final   Organism ID, Bacteria STAPHYLOCOCCUS AUREUS  Final      Susceptibility   Staphylococcus aureus - MIC*    CIPROFLOXACIN <=0.5 SENSITIVE Sensitive     ERYTHROMYCIN <=0.25 SENSITIVE Sensitive     GENTAMICIN <=0.5 SENSITIVE Sensitive     OXACILLIN 0.5 SENSITIVE Sensitive     TETRACYCLINE <=1 SENSITIVE Sensitive     VANCOMYCIN 1 SENSITIVE Sensitive     TRIMETH/SULFA  <=10 SENSITIVE Sensitive     CLINDAMYCIN <=0.25 SENSITIVE Sensitive     RIFAMPIN <=0.5 SENSITIVE Sensitive     Inducible Clindamycin NEGATIVE Sensitive     * STAPHYLOCOCCUS AUREUS  Resp Panel by RT-PCR (Flu Earnestine Tuohey&B, Covid) Nasopharyngeal Swab     Status: Abnormal   Collection Time: 03/17/2020  9:58 PM   Specimen: Nasopharyngeal Swab; Nasopharyngeal(NP) swabs in vial transport medium  Result Value Ref Range Status   SARS Coronavirus 2 by RT PCR POSITIVE (Tatumn Corbridge) NEGATIVE Final    Comment: RESULT CALLED TO, READ BACK BY AND VERIFIED WITH: JEN AGNEW RN 2324 04/06/2020 (NOTE) SARS-CoV-2 target nucleic acids are DETECTED.  The SARS-CoV-2 RNA is generally detectable in upper respiratory specimens during the acute phase of infection. Positive results are indicative of the presence of the identified virus, but do not rule out bacterial infection or co-infection with other pathogens not detected by the test. Clinical correlation with patient history  and other diagnostic information is necessary to determine patient infection status. The expected result is Negative.  Fact Sheet for Patients: EntrepreneurPulse.com.au  Fact Sheet for Healthcare Providers: IncredibleEmployment.be  This test is not yet approved or cleared by the [place] FDA and  has been authorized for detection and/or diagnosis of SARS-CoV-2 by FDA under an Emergency Use Authorization (EUA).  This EUA will remain in effect (meaning this test can be used)  for the duration of  the COVID-19 declaration under Section 564(b)(1) of the Act, 21 U.S.C. section 360bbb-3(b)(1), unless the authorization is terminated or revoked sooner.     Influenza Brittanee Ghazarian by PCR NEGATIVE NEGATIVE Final   Influenza B by PCR NEGATIVE NEGATIVE Final    Comment: (NOTE) The Xpert Xpress SARS-CoV-2/FLU/RSV plus assay is intended as an aid in the diagnosis of influenza from Nasopharyngeal swab specimens and should not be used as  Marsia Cino sole basis for treatment. Nasal washings and aspirates are unacceptable for Xpert Xpress SARS-CoV-2/FLU/RSV testing.  Fact Sheet for Patients: EntrepreneurPulse.com.au  Fact Sheet for Healthcare Providers: IncredibleEmployment.be  This test is not yet approved or cleared by the [place] FDA and has been authorized for detection and/or diagnosis of SARS-CoV-2 by FDA under an Emergency Use Authorization (EUA). This EUA will remain in effect (meaning this test can be used) for the duration of the COVID-19 declaration under Section 564(b)(1) of the Act, 21 U.S.C. section 360bbb-3(b)(1), unless the authorization is terminated or revoked.  Performed at [facility], [address] [place], [place] [address]   Blood Culture ID Panel (Reflexed)     Status: Abnormal   Collection Time: 03/16/2020  9:58 PM  Result Value Ref Range Status   Enterococcus faecalis NOT DETECTED NOT DETECTED Final   Enterococcus Faecium NOT DETECTED NOT DETECTED Final   Listeria monocytogenes NOT DETECTED NOT DETECTED Final   Staphylococcus species DETECTED (Jaxtin Raimondo) NOT DETECTED Final    Comment: CRITICAL RESULT CALLED TO, READ BACK BY AND VERIFIED WITH:  JASON ROBBINS AT 0509 03/27/20 SDR    Staphylococcus aureus (BCID) DETECTED (Julien Berryman) NOT DETECTED Final    Comment: CRITICAL RESULT CALLED TO, READ BACK BY AND VERIFIED WITH:  JASON ROBBINS AT 0509 03/27/20 SDR    Staphylococcus epidermidis NOT DETECTED NOT DETECTED Final   Staphylococcus lugdunensis NOT DETECTED NOT DETECTED Final   Streptococcus species NOT DETECTED NOT DETECTED Final   Streptococcus agalactiae NOT DETECTED NOT DETECTED Final   Streptococcus pneumoniae NOT DETECTED NOT DETECTED Final   Streptococcus pyogenes NOT DETECTED NOT DETECTED Final   Waddell Iten.calcoaceticus-baumannii NOT DETECTED NOT DETECTED Final   Bacteroides fragilis NOT DETECTED NOT DETECTED Final   Enterobacterales NOT DETECTED NOT DETECTED  Final   Enterobacter cloacae complex NOT DETECTED NOT DETECTED Final   Escherichia coli NOT DETECTED NOT DETECTED Final   Klebsiella aerogenes NOT DETECTED NOT DETECTED Final   Klebsiella oxytoca NOT DETECTED NOT DETECTED Final   Klebsiella pneumoniae NOT DETECTED NOT DETECTED Final   Proteus species NOT DETECTED NOT DETECTED Final   Salmonella species NOT DETECTED NOT DETECTED Final   Serratia marcescens NOT DETECTED NOT DETECTED Final   Haemophilus influenzae NOT DETECTED NOT DETECTED Final   Neisseria meningitidis NOT DETECTED NOT DETECTED Final   Pseudomonas aeruginosa NOT DETECTED NOT DETECTED Final   Stenotrophomonas maltophilia NOT DETECTED NOT DETECTED Final   Candida albicans NOT DETECTED NOT DETECTED Final   Candida auris NOT DETECTED NOT DETECTED Final   Candida glabrata NOT DETECTED NOT DETECTED Final   Candida  krusei NOT DETECTED NOT DETECTED Final   Candida parapsilosis NOT DETECTED NOT DETECTED Final   Candida tropicalis NOT DETECTED NOT DETECTED Final   Cryptococcus neoformans/gattii NOT DETECTED NOT DETECTED Final   Meth resistant mecA/C and MREJ NOT DETECTED NOT DETECTED Final    Comment: Performed at [facility], [address] [place], [place] [address]  CULTURE, BLOOD (ROUTINE X 2) w Reflex to ID Panel     Status: None (Preliminary result)   Collection Time: 03/27/20  7:33 AM   Specimen: BLOOD  Result Value Ref Range Status   Specimen Description BLOOD LEFT HAND  Final   Special Requests   Final    BOTTLES DRAWN AEROBIC AND ANAEROBIC Blood Culture results may not be optimal due to an excessive volume of blood received in culture bottles   Culture   Final    NO GROWTH 3 DAYS Performed at [facility], [address] [place], [place] [address]    Report Status PENDING  Incomplete  CULTURE, BLOOD (ROUTINE X 2) w Reflex to ID Panel     Status: None (Preliminary result)   Collection Time: 03/27/20  7:35 AM   Specimen: BLOOD  Result Value Ref  Range Status   Specimen Description BLOOD RIGHT HAND  Final   Special Requests   Final    BOTTLES DRAWN AEROBIC AND ANAEROBIC Blood Culture results may not be optimal due to an excessive volume of blood received in culture bottles   Culture   Final    NO GROWTH 3 DAYS Performed at [facility], [address] [place], [place] [address]    Report Status PENDING  Incomplete  MRSA PCR Screening     Status: Abnormal   Collection Time: 03/27/20 12:00 PM   Specimen: Nasopharyngeal  Result Value Ref Range Status   MRSA by PCR POSITIVE (Emiliana Blaize) NEGATIVE Final    Comment:        The GeneXpert MRSA Assay (FDA approved for NASAL specimens only), is one component of Kataleia Quaranta comprehensive MRSA colonization surveillance program. It is not intended to diagnose MRSA infection nor to guide or monitor treatment for MRSA infections. RESULT CALLED TO, READ BACK BY AND VERIFIED WITHMaryland Pink AT 9563 03/27/20 SDR Performed at [facility], [address] [place], [place] [address]   Urine culture     Status: None   Collection Time: 03/27/20 12:08 PM   Specimen: In/Out Cath Urine  Result Value Ref Range Status   Specimen Description   Final    IN/OUT CATH URINE Performed at [facility], [address] [place], [place] [address]    Special Requests   Final    NONE Performed at [facility], [address], [place], [place] [address]    Culture   Final    NO GROWTH Performed at [facility], [address], [place], [place] [address]    Report Status 03/29/2020 FINAL  Final         Radiology Studies: No results found.      Scheduled Meds: . vitamin C  500 mg Oral Daily  . aspirin EC  81 mg Oral Daily  . chlorhexidine  15 mL Mouth Rinse BID  . Chlorhexidine Gluconate Cloth  6 each Topical Q0600  . cyanocobalamin  1,000 mcg Intramuscular Daily   Followed by  . [START ON 04/03/2020] vitamin B-12  1,000 mcg Oral Daily  . enoxaparin  (LOVENOX) injection  40 mg Subcutaneous  Q24H  . feeding supplement (NEPRO CARB STEADY)  237 mL Oral TID BM  . folic acid  1 mg Oral Daily  . insulin aspart  0-5 Units Subcutaneous QHS  . insulin aspart  0-9 Units Subcutaneous TID WC  . linagliptin  5 mg Oral Daily  . losartan  25 mg Oral Daily  . mouth rinse  15 mL Mouth Rinse q12n4p  . metoprolol tartrate  50 mg Oral BID  . multivitamin with minerals  1 tablet Oral Daily  . mupirocin ointment  1 application Nasal BID  . nystatin  5 mL Oral QID  . rosuvastatin  5 mg Oral Daily  . thiamine injection  100 mg Intravenous Daily  . zinc sulfate  220 mg Oral Daily   Continuous Infusions: . sodium chloride Stopped (03/28/20 1506)  .  ceFAZolin (ANCEF) IV 2 g (03/31/20 1319)     LOS: 5 days    Time spent: over 30 min    Fayrene Helper, MD [facility]   To contact the attending provider between 7A-7P or the covering provider during after hours 7P-7A, please log into the web site [email or web address] and access using universal [place] password for that web site. If you do not have the password, please call the hospital operator.  03/31/2020, 3:09 PM

## 2020-03-31 NOTE — Progress Notes (Signed)
Date of Admission:  03/18/2020    ID: [name] is a 79 y.o. male  Active Problems:   SOB (shortness of breath)   Essential hypertension   COPD (chronic obstructive pulmonary disease) (HCC)   Seizure disorder (HCC)   Parkinson's variant of multiple system atrophy (HCC)   PSVT (paroxysmal supraventricular tachycardia) (HCC)   CAD (coronary artery disease)   Severe sepsis ([place])   Pneumonia due to COVID-19 virus   Acute respiratory failure due to COVID-19 [facility])   Acute hypernatremia   Rapid atrial fibrillation, new onset ([place])   Acute metabolic encephalopathy   Bacterial pneumonia   Sepsis ([place])   Goals of care, counseling/discussion   Palliative care by specialist   DNR (do not resuscitate)   MSSA bacteremia    Subjective: Non history available from aptient He is moving all over in his bed Not sob  Medications:  . vitamin C  500 mg Oral Daily  . aspirin EC  81 mg Oral Daily  . chlorhexidine  15 mL Mouth Rinse BID  . Chlorhexidine Gluconate Cloth  6 each Topical Q0600  . cyanocobalamin  1,000 mcg Intramuscular Daily   Followed by  . [START ON 04/03/2020] vitamin B-12  1,000 mcg Oral Daily  . enoxaparin (LOVENOX) injection  40 mg Subcutaneous Q24H  . feeding supplement (NEPRO CARB STEADY)  237 mL Oral TID BM  . folic acid  1 mg Oral Daily  . insulin aspart  0-5 Units Subcutaneous QHS  . insulin aspart  0-9 Units Subcutaneous TID WC  . linagliptin  5 mg Oral Daily  . losartan  25 mg Oral Daily  . mouth rinse  15 mL Mouth Rinse q12n4p  . metoprolol tartrate  50 mg Oral BID  . multivitamin with minerals  1 tablet Oral Daily  . mupirocin ointment  1 application Nasal BID  . nystatin  5 mL Oral QID  . rosuvastatin  5 mg Oral Daily  . thiamine injection  100 mg Intravenous Daily  . zinc sulfate  220 mg Oral Daily    Objective: Vital signs in last 24 hours: Temp:  [97.2 F (36.2 C)-98 F (36.7 C)] 97.9 F (36.6 C) (12/20 0936) Pulse Rate:  [66-81] 81  (12/20 0424) Resp:  [15-21] 18 (12/20 0424) BP: (109-132)/(61-92) 109/74 (12/20 0936) SpO2:  [94 %-100 %] 99 % (12/20 0936) Weight:  [62.7 kg] 62.7 kg (12/20 0500)  PHYSICAL EXAM:  General:Awake, a bit restless, not sob, frail, says a few words but appears confused , Lungs:b/l air entry Heart: s1s2 Abdomen: Soft, non-tender,not distended. Bowel sounds normal. No masses Extremities: atraumatic, no cyanosis. No edema. No clubbing Skin: No rashes or lesions. Or bruising Lymph: Cervical, supraclavicular normal. Neurologic: Grossly non-focal  Lab Results Recent Labs    03/30/20 0426 03/31/20 0417  WBC 14.6* 15.1*  HGB 14.2 14.0  HCT 40.4 40.1  NA 136 136  K 3.5 3.6  CL 101 100  CO2 28 26  BUN 14 20  CREATININE 0.46* 0.52*   Liver Panel Recent Labs    03/30/20 0426 03/31/20 0417  PROT 5.2* 4.9*  ALBUMIN 2.5* 2.3*  AST 24 26  ALT 12 14  ALKPHOS 54 60  BILITOT 1.1 1.3*   Sedimentation Rate No results for input(s): ESRSEDRATE in the last 72 hours. C-Reactive Protein Recent Labs    03/30/20 0427 03/31/20 0417  CRP 0.8 0.7    Microbiology:  Studies/Results: No results found.   Assessment/Plan: SARS COV2 viral  infection with pneumonia - received remdisvir and steroids-completed course  MSSA bacteremia secondary to superadded infection 1/4 blood culture - low bioburden on 03/16/2020. Repeat blood culture on 12/16 ng. 2 d echo Likely has bacterial pneumonia as well On cefazolin- will give for a total of 2-3 weeks  Endocarditis is unlikely  End date is 04/16/19  COPD Some leucocytosis- could be a steroid effect- observe  Encephalopathy ( dont know what his baseline is-)   Discussed with his nurse

## 2020-03-31 NOTE — Plan of Care (Signed)
  Problem: Clinical Measurements: Goal: Respiratory complications will improve Outcome: Progressing   Problem: Clinical Measurements: Goal: Cardiovascular complication will be avoided Outcome: Completed/Met   Problem: Elimination: Goal: Will not experience complications related to bowel motility Outcome: Progressing   Problem: Elimination: Goal: Will not experience complications related to urinary retention Outcome: Progressing   Problem: Pain Managment: Goal: General experience of comfort will improve Outcome: Progressing   Problem: Safety: Goal: Ability to remain free from injury will improve Outcome: Progressing

## 2020-03-31 NOTE — Progress Notes (Signed)
Pt is moving to room 122. Report given to Leanora Cover RN. Pt son [name] made aware of pt moving.

## 2020-04-01 DIAGNOSIS — B9561 Methicillin susceptible Staphylococcus aureus infection as the cause of diseases classified elsewhere: Secondary | ICD-10-CM | POA: Diagnosis not present

## 2020-04-01 DIAGNOSIS — R7881 Bacteremia: Secondary | ICD-10-CM | POA: Diagnosis not present

## 2020-04-01 DIAGNOSIS — Z7189 Other specified counseling: Secondary | ICD-10-CM | POA: Diagnosis not present

## 2020-04-01 LAB — CULTURE, BLOOD (ROUTINE X 2)
Culture: NO GROWTH
Culture: NO GROWTH

## 2020-04-01 LAB — CBC WITH DIFFERENTIAL/PLATELET
Abs Immature Granulocytes: 0.19 10*3/uL — ABNORMAL HIGH (ref 0.00–0.07)
Basophils Absolute: 0 10*3/uL (ref 0.0–0.1)
Basophils Relative: 0 %
Eosinophils Absolute: 0.1 10*3/uL (ref 0.0–0.5)
Eosinophils Relative: 0 %
HCT: 35.7 % — ABNORMAL LOW (ref 39.0–52.0)
Hemoglobin: 12.5 g/dL — ABNORMAL LOW (ref 13.0–17.0)
Immature Granulocytes: 1 %
Lymphocytes Relative: 8 %
Lymphs Abs: 1.5 10*3/uL (ref 0.7–4.0)
MCH: 30.1 pg (ref 26.0–34.0)
MCHC: 35 g/dL (ref 30.0–36.0)
MCV: 86 fL (ref 80.0–100.0)
Monocytes Absolute: 1.8 10*3/uL — ABNORMAL HIGH (ref 0.1–1.0)
Monocytes Relative: 10 %
Neutro Abs: 15.2 10*3/uL — ABNORMAL HIGH (ref 1.7–7.7)
Neutrophils Relative %: 81 %
Platelets: 291 10*3/uL (ref 150–400)
RBC: 4.15 MIL/uL — ABNORMAL LOW (ref 4.22–5.81)
RDW: 13.9 % (ref 11.5–15.5)
WBC: 18.8 10*3/uL — ABNORMAL HIGH (ref 4.0–10.5)
nRBC: 0 % (ref 0.0–0.2)

## 2020-04-01 LAB — COMPREHENSIVE METABOLIC PANEL
ALT: 11 U/L (ref 0–44)
AST: 24 U/L (ref 15–41)
Albumin: 2.2 g/dL — ABNORMAL LOW (ref 3.5–5.0)
Alkaline Phosphatase: 53 U/L (ref 38–126)
Anion gap: 7 (ref 5–15)
BUN: 37 mg/dL — ABNORMAL HIGH (ref 8–23)
CO2: 27 mmol/L (ref 22–32)
Calcium: 7.7 mg/dL — ABNORMAL LOW (ref 8.9–10.3)
Chloride: 107 mmol/L (ref 98–111)
Creatinine, Ser: 0.74 mg/dL (ref 0.61–1.24)
GFR, Estimated: >60 mL/min (ref >60)
Glucose, Bld: 118 mg/dL — ABNORMAL HIGH (ref 70–99)
Potassium: 3.4 mmol/L — ABNORMAL LOW (ref 3.5–5.1)
Sodium: 141 mmol/L (ref 135–145)
Total Bilirubin: 0.9 mg/dL (ref 0.3–1.2)
Total Protein: 4.6 g/dL — ABNORMAL LOW (ref 6.5–8.1)

## 2020-04-01 LAB — HEMOGLOBIN AND HEMATOCRIT, BLOOD
HCT: 30.6 % — ABNORMAL LOW (ref 39.0–52.0)
HCT: 30.6 % — ABNORMAL LOW (ref 39.0–52.0)
Hemoglobin: 10.2 g/dL — ABNORMAL LOW (ref 13.0–17.0)
Hemoglobin: 10.4 g/dL — ABNORMAL LOW (ref 13.0–17.0)

## 2020-04-01 LAB — GLUCOSE, CAPILLARY
Glucose-Capillary: 130 mg/dL — ABNORMAL HIGH (ref 70–99)
Glucose-Capillary: 111 mg/dL — ABNORMAL HIGH (ref 70–99)
Glucose-Capillary: 106 mg/dL — ABNORMAL HIGH (ref 70–99)
Glucose-Capillary: 96 mg/dL (ref 70–99)

## 2020-04-01 LAB — FERRITIN: Ferritin: 198 ng/mL (ref 24–336)

## 2020-04-01 LAB — FIBRIN DERIVATIVES D-DIMER (ARMC ONLY): Fibrin derivatives D-dimer (ARMC): 549.74 ng/mL (FEU) — ABNORMAL HIGH (ref 0.00–499.00)

## 2020-04-01 LAB — C-REACTIVE PROTEIN: CRP: 0.6 mg/dL (ref <1.0)

## 2020-04-01 LAB — PHOSPHORUS: Phosphorus: 1.4 mg/dL — ABNORMAL LOW (ref 2.5–4.6)

## 2020-04-01 LAB — MAGNESIUM: Magnesium: 2.2 mg/dL (ref 1.7–2.4)

## 2020-04-01 MED ORDER — K PHOS MONO-SOD PHOS DI & MONO 155-852-130 MG PO TABS
500.0000 mg | ORAL_TABLET | Freq: Once | ORAL | Status: DC
  Filled 2020-04-01: qty 2

## 2020-04-01 MED ORDER — PANTOPRAZOLE SODIUM 40 MG IV SOLR: 40.0000 mg | Freq: Two times a day (BID) | INTRAVENOUS | Status: DC

## 2020-04-01 MED ORDER — SODIUM CHLORIDE 0.9 % IV SOLN
8.0000 mg/h | INTRAVENOUS | Status: DC
  Administered 2020-04-01 – 2020-04-02 (×3): 8 mg/h via INTRAVENOUS
  Filled 2020-04-01 (×3): qty 80

## 2020-04-01 MED ORDER — SODIUM CHLORIDE 0.9 % IV SOLN
80.0000 mg | Freq: Once | INTRAVENOUS | Status: AC
  Administered 2020-04-01: 18:00:00 80 mg via INTRAVENOUS
  Filled 2020-04-01: qty 80

## 2020-04-01 MED ORDER — DEXTROSE 5 % IV SOLN
2.0000 g | Freq: Three times a day (TID) | INTRAVENOUS | Status: DC
  Filled 2020-04-01 (×4): qty 2000

## 2020-04-01 MED ORDER — CEFAZOLIN SODIUM-DEXTROSE 2-4 GM/100ML-% IV SOLN
2.0000 g | Freq: Three times a day (TID) | INTRAVENOUS | Status: DC
  Administered 2020-04-01 – 2020-04-03 (×8): 2 g via INTRAVENOUS
  Filled 2020-04-01 (×10): qty 100

## 2020-04-01 MED ORDER — THIAMINE HCL 100 MG PO TABS
100.0000 mg | ORAL_TABLET | Freq: Every day | ORAL | Status: DC
  Administered 2020-04-02: 08:00:00 100 mg via ORAL
  Filled 2020-04-01: qty 1

## 2020-04-01 NOTE — Progress Notes (Signed)
SLP Cancellation Note  Patient Details Name: [name] MRN: [id number] DOB: [birth year]   Cancelled treatment:       Reason Eval/Treat Not Completed:  (chart reviewed; consulted NSG re: pt's status today). Per NSG report, patient has been restless and confused with poor appetite. He only accepts 1-3 bites, sip of Nectar liquid then refuses further. He is tolerating his Pills Crushed in Puree w/ encouragement. MD is concerned about AMS; still remains on O2 support w/ O2 sats in the upper 80s-90s at times, per MD note. Palliative Care consult has been placed to address [place]. ST services will continue to f/u w/ pt's status while admitted; no recommendation to upgrade diet consistency at this time d/t continued illness, weakness, AMS, and increased risk for aspiration. NSG/MD updated. Recommend frequent oral care; aspiration precautions w/ all oral intake.    Orinda Kenner, MS, CCC-SLP Speech Language Pathologist Rehab Services [phone] [facility] 04/01/2020, 1:54 PM

## 2020-04-01 NOTE — Progress Notes (Addendum)
Daily Progress Note   Patient Name: [name]       Date: 04/01/2020 DOB: 31-Aug-1940  Age: 79 y.o. MRN#: [id number] Attending Physician: Elodia Florence., * Primary Care Physician: Idelle Crouch, MD Admit Date: 03/31/2020  Reason for Consultation/Follow-up: Establishing goals of care  Subjective: Patient is on covid isolation. Spoke with primary team, and delirium and lack of intake are of concern among his other diagnoses.  Per nursing, patient has been restless and confused with poor appetite.   Attempted to call wife and VM left. Spoke with patient's son. He discusses family dynamics. He states he has been well updated by medical staff, and accurately verbalizes patient's status, and discusses delirium and oral intake.   He states the patient's wife Katharine Look is in quarantine from covid. He is unsure how long she has left on quarantine. He states the patient has 3 biological children, 2 daughters and himself. He states one of his daughters Sondra Come is estranged. He states he has been keeping the family updated. He states they have been trying to do what is in the patient's best interest, and what he would want to do. Son continues to confirm DNR/DNI, and states the family would not want a feeding tube, but would like more time to see how he does before making definitive decisions.   Told son I will re-attempt to call patient's wife tomorrow, and he stated he would give her the message.     Length of Stay: 6  Current Medications: Scheduled Meds:  . vitamin C  500 mg Oral Daily  . aspirin EC  81 mg Oral Daily  . chlorhexidine  15 mL Mouth Rinse BID  . cyanocobalamin  1,000 mcg Intramuscular Daily   Followed by  . [START ON 04/03/2020] vitamin B-12  1,000 mcg Oral Daily   . enoxaparin (LOVENOX) injection  40 mg Subcutaneous Q24H  . feeding supplement (NEPRO CARB STEADY)  237 mL Oral TID BM  . folic acid  1 mg Oral Daily  . insulin aspart  0-5 Units Subcutaneous QHS  . insulin aspart  0-9 Units Subcutaneous TID WC  . linagliptin  5 mg Oral Daily  . losartan  25 mg Oral Daily  . mouth rinse  15 mL Mouth Rinse q12n4p  . metoprolol tartrate  50 mg  Oral BID  . multivitamin with minerals  1 tablet Oral Daily  . mupirocin ointment  1 application Nasal BID  . nystatin  5 mL Oral QID  . rosuvastatin  5 mg Oral Daily  . thiamine injection  100 mg Intravenous Daily  . zinc sulfate  220 mg Oral Daily    Continuous Infusions: . sodium chloride 1,000 mL (04/01/20 0635)  .  ceFAZolin (ANCEF) IV    . dextrose 5% lactated ringers 75 mL/hr at 03/31/20 1606    PRN Meds: sodium chloride, acetaminophen **OR** acetaminophen, chlorpheniramine-HYDROcodone, guaiFENesin-dextromethorphan, levalbuterol, metoprolol tartrate, morphine injection, ondansetron **OR** ondansetron (ZOFRAN) IV        Vital Signs: BP 90/69   Pulse 80   Temp (!) 97.5 F (36.4 C)   Resp (!) 28   Ht 6' (1.829 m)   Wt 62.5 kg   SpO2 100%   BMI 18.69 kg/m  SpO2: SpO2: 100 % O2 Device: O2 Device: Nasal Cannula O2 Flow Rate: O2 Flow Rate (L/min): 2 L/min  Intake/output summary:   Intake/Output Summary (Last 24 hours) at 04/01/2020 1238 Last data filed at 03/31/2020 2117 Gross per 24 hour  Intake 220 ml  Output --  Net 220 ml   LBM: Last BM Date:  (unknown) Baseline Weight: Weight: 59 kg Most recent weight: Weight: 62.5 kg       Palliative Assessment/Data:    Flowsheet Rows   Flowsheet Row Most Recent Value  Intake Tab   Referral Department Hospitalist  Unit at Time of Referral Intermediate Care Unit  Palliative Care Primary Diagnosis Sepsis/Infectious Disease  Date Notified 03/26/20  Palliative Care Type New Palliative care  Reason for referral Clarify Goals of Care  Date of  Admission 03/22/2020  Date first seen by Palliative Care 03/27/20  # of days Palliative referral response time 1 Day(s)  # of days IP prior to Palliative referral 1  Clinical Assessment   Palliative Performance Scale Score 20%  Psychosocial & Spiritual Assessment   Palliative Care Outcomes   Patient/Family meeting held? Yes  Who was at the meeting? son  Palliative Care Outcomes Clarified goals of care, Provided psychosocial or spiritual support      Patient Active Problem List   Diagnosis Date Noted  . MSSA bacteremia   . Goals of care, counseling/discussion   . Palliative care by specialist   . DNR (do not resuscitate)   . Severe sepsis ([place]) 03/26/2020  . Pneumonia due to COVID-19 virus 03/26/2020  . Acute respiratory failure due to COVID-19 ([place]) 03/26/2020  . Acute hypernatremia 03/26/2020  . Rapid atrial fibrillation, new onset ([place]) 03/26/2020  . Acute metabolic encephalopathy [id number]  . Bacterial pneumonia 03/26/2020  . Sepsis ([place]) 03/26/2020  . Atherosclerosis of lower extremity with claudication ([place]) 09/13/2017  . COPD exacerbation ([place]) 08/09/2017  . Hypokalemia 06/20/2016  . Syncope   . CAD (coronary artery disease) 06/19/2016  . Status post coronary artery stent placement   . PSVT (paroxysmal supraventricular tachycardia) ([place])   . NSVT (nonsustained ventricular tachycardia) ([place])   . NSTEMI (non-ST elevated myocardial infarction) ([place])   . Parkinson's variant of multiple system atrophy ([place]) 12/19/2015  . Encephalopathy chronic 12/14/2015  . Chest pain   . COPD (chronic obstructive pulmonary disease) ([place])   . Heart disease   . Seizure disorder ([place])   . Depression   . Tardive dyskinesia   . Dysphagia   . Skin lesion 08/14/2015  . SOB (shortness of breath) 08/14/2015  . Essential hypertension  08/14/2015  . Hyperlipidemia 08/14/2015  . Essential tremor 08/14/2015    Palliative Care Assessment & Plan   Recommendations/Plan:  Would recommend to  primary team initiating Risperdal 0.5mg  BID day 1, and increasing to 1mg  BID day 2 for delirium.  Addendum: Repeat EKG obtained and continued prolonged QTC noted. Could consider speaking with family about risk vs benefit of the medication as the delirium may impact overall decision to transition to comfort care. Could use low dosing. Risperdal is the least sedating of the medication class, and all of the medications in the class could affect QTC.       Palliative will continue to follow.     Code Status:    Code Status Orders  (From admission, onward)         Start     Ordered   03/27/20 0910  Do not attempt resuscitation (DNR)  Continuous       Question Answer Comment  In the event of cardiac or respiratory ARREST Do not call a "code blue"   In the event of cardiac or respiratory ARREST Do not perform Intubation, CPR, defibrillation or ACLS   In the event of cardiac or respiratory ARREST Use medication by any route, position, wound care, and other measures to relive pain and suffering. May use oxygen, suction and manual treatment of airway obstruction as needed for comfort.      03/27/20 0910        Code Status History    Date Active Date Inactive Code Status Order ID Comments User Context   03/26/2020 0147 03/27/2020 0910 Full Code [id number]  Athena Masse, MD ED   08/09/2017 1416 08/10/2017 2047 Full Code [id number]  Gladstone Lighter, MD Inpatient   06/19/2016 1240 06/20/2016 1942 Full Code [id number]  Maren Reamer, MD ED   01/17/2016 1744 01/21/2016 1516 DNR [id number]  Isaiah Serge, NP ED   12/23/2015 1303 01/17/2016 1140 DNR [id number]  Ludwig Clarks, DO Outpatient   12/14/2015 1340 12/16/2015 1602 DNR [id number]  Radene Gunning, NP Inpatient   12/14/2015 1238 12/14/2015 1340 Full Code [id number]  Radene Gunning, NP ED   Advance Care Planning Activity       Prognosis:   Poor overall  Care plan was discussed with primary team. Case discussed with Dr. Hilma Favors.   Thank you  for allowing the Palliative Medicine Team to assist in the care of this patient.   Total Time  35 min Prolonged Time Billed  no         Greater than 50%  of this time was spent counseling and coordinating care related to the above assessment and plan.  COVID-19 DISASTER DECLARATION:    PHYSICAL EXAMINATION WAS NOT POSSIBLE DUE TO TREATMENT OF COVID-19  AND CONSERVATION OF PERSONAL PROTECTIVE EQUIPMENT-   Patient assessed or the symptoms described in the history of present illness.  In the context of the Global COVID-19 pandemic, which necessitated consideration that the patient might be at risk for infection with the SARS-CoV-2 virus that causes COVID-19, Institutional protocols and algorithms that pertain to the evaluation of patients at risk for COVID-19 are in a state of rapid change based on information released by regulatory bodies including the CDC and federal and state organizations. These policies and algorithms were followed during the patient's care while in hospital.  Asencion Gowda, NP  Please contact Palliative Medicine Team phone at [phone] for questions and concerns.

## 2020-04-01 NOTE — Progress Notes (Signed)
   04/01/20 0933  Assess: MEWS Score  Temp (!) 97.5 F (36.4 C)  BP 90/69  Pulse Rate 80  Resp (!) 28  Level of Consciousness Alert  SpO2 100 %  O2 Device Nasal Cannula  O2 Flow Rate (L/min) 2 L/min  Assess: MEWS Score  MEWS Temp 0  MEWS Systolic 1  MEWS Pulse 0  MEWS RR 2  MEWS LOC 0  MEWS Score 3  MEWS Score Color Yellow  Assess: if the MEWS score is Yellow or Red  Were vital signs taken at a resting state? Yes  Focused Assessment No change from prior assessment  Early Detection of Sepsis Score *See Row Information* Low  MEWS guidelines implemented *See Row Information* Yes  Treat  MEWS Interventions Administered scheduled meds/treatments;Administered prn meds/treatments  Pain Scale 0-10  Pain Score 0  Take Vital Signs  Increase Vital Sign Frequency  Yellow: Q 2hr X 2 then Q 4hr X 2, if remains yellow, continue Q 4hrs  Escalate  MEWS: Escalate Yellow: discuss with charge nurse/RN and consider discussing with provider and RRT  Notify: Charge Nurse/RN  Name of Charge Nurse/RN Notified Regino Schultze RN  Date Charge Nurse/RN Notified 04/01/20  Time Charge Nurse/RN Notified [phone]  Document  Patient Outcome Not stable and remains on department  Progress note created (see row info) Yes

## 2020-04-01 NOTE — Progress Notes (Signed)
   04/01/20 1813  Assess: MEWS Score  Temp 98.2 F (36.8 C)  BP (!) 89/60  Pulse Rate 98  Resp (!) 28  Level of Consciousness Alert  SpO2 94 %  O2 Device Room Air  Assess: MEWS Score  MEWS Temp 0  MEWS Systolic 1  MEWS Pulse 0  MEWS RR 2  MEWS LOC 0  MEWS Score 3  MEWS Score Color Yellow  Assess: if the MEWS score is Yellow or Red  Were vital signs taken at a resting state? Yes  Focused Assessment No change from prior assessment  Early Detection of Sepsis Score *See Row Information* Low  MEWS guidelines implemented *See Row Information* Yes  Treat  MEWS Interventions Administered prn meds/treatments  Take Vital Signs  Increase Vital Sign Frequency  Yellow: Q 2hr X 2 then Q 4hr X 2, if remains yellow, continue Q 4hrs  Escalate  MEWS: Escalate Yellow: discuss with charge nurse/RN and consider discussing with provider and RRT  Notify: Charge Nurse/RN  Name of Charge Nurse/RN Notified Brandi RN  Date Charge Nurse/RN Notified 04/01/20  Time Charge Nurse/RN Notified [place]  Document  Patient Outcome Stabilized after interventions  Progress note created (see row info) Yes

## 2020-04-01 NOTE — Progress Notes (Addendum)
PROGRESS NOTE    [name]  [id number] DOB: Sep 10, 1940 DOA: 03/27/2020 PCP: Jay Crouch, MD   Chief Complaint  Patient presents with  . Shortness of Breath    Brief Narrative:  [name] is [name] 79 y.o. male with medical history significant for HTN, COPD, seizures disorder, Parkinson's, CAD, with history of COVID-19 exposure from his wife who was recently discharge, who is brought in by EMS with [name] several day history of shortness of breath and cough, and who has been lying in bed, with poor oral intake, arriving unkempt and soiled and disoriented.  He was found to have covid and lobar pneumonia as well as MSSA bacteremia.  He's doing poorly overall with poor oral intake, though is back on room air.  Have discussed poor prognosis with son, palliative care is following to help with goals of care discussions.    Assessment & Plan:   Active Problems:   SOB (shortness of breath)   Essential hypertension   COPD (chronic obstructive pulmonary disease) (HCC)   Seizure disorder (HCC)   Parkinson's variant of multiple system atrophy (HCC)   PSVT (paroxysmal supraventricular tachycardia) (HCC)   CAD (coronary artery disease)   Severe sepsis (HCC)   Pneumonia due to COVID-19 virus   Acute respiratory failure due to COVID-19 [facility])   Acute hypernatremia   Rapid atrial fibrillation, new onset ([place])   Acute metabolic encephalopathy   Bacterial pneumonia   Sepsis ([place])   Goals of care, counseling/discussion   Palliative care by specialist   DNR (do not resuscitate)   MSSA bacteremia  Addendum: nursing noted tarry stools -> stop aspirin and lovenox -> start PPI bolus and infusion.  Check hemoglobin and type and screen.  Discussed with son. Currently hemodynamically stable. Consider GI consult in AM pending overall GOC   Goals of care: DNR per son and pt's wife.  Pt's wife wants [name] to make decisions per our discussion.  Overall prognosis is poor.  Palliative  care following.  He's not eating, taking minimal PO.  Discussed with son, he's got poor intake - likely related to covid, delirium, MSSA bacteremia - currently addressing these as below - but his prognosis is poor.  If his PO intake does not improve, discussed that comfort measures and hospice may be most appropriate.  Appreciate palliative care assistance Repeat EKG - palliative care recommending risperdal, will hold off with prolonged QTc and follow  Severe sepsis/septic shock secondary to covid 19 viral pneumonia  Acute hypoxic respiratory failure  Superimposed Bacteria Community Acquired Pneumonia  MSSA Bacteremia - Patient presented with altered mental status, cough and shortness of breath, O2 sat 88% on room air, tachycardia, increased work of breathing requiring 4 L O2, leukocytosis, lactic acid 7.  Afebrile and not hypotensive - currently on room air - CT chest with right lower lobe pneumonia - SLP eval for aspiration -  Dysphagia 1, nectar thick liquids - he's completed remdesivir.  With improvement in oxygen status and bacteremia, will d/c steroids. - 12/14 blood cx with staph aureus (mssa) - repeat cx from 12/16 NGTD x5 days - urine cx NG - continue ancef per ID, plan for 2-3 weeks - ID c/s, appreciate recommendations - recommending ancef, echo (EF 40-45%, global hypokinesis, valves not well visualized), repeat blood cx - endocarditis thought unlikely per ID -- maintain O2>88% -- lactate improved -- I/O, daily weights -- Prone as able, OOB, flutter, IS, therapy  COVID-19 Labs  Recent Labs  03/30/20 0427 03/31/20 0417 04/01/20 0543  FERRITIN  --   --  198  CRP 0.8 0.7 0.6    Lab Results  Component Value Date   SARSCOV2NAA POSITIVE ([name]) 64/40/3474   Acute metabolic encephalopathy -Secondary to acute infection and sepsis  - delirium precautions  -- follow TSH (low follow free T4 - elevated - see below), vitamin b12 (low, supplement), folate (low, supplement), ammonia  (wnl)  Leukocytosis: likely 2/2 steroids, infection - follow, steroids now d/c'd  Lactic Acidosis: suspect this is 2/2 hypovolemia/dehydration/sepsis from above, pt still with hypernatremia, reports feeling thirsty, does not clinically appear overloaded on exam.  Follow with IVF.  Elevated BNP noted, though clinically not overloaded at this time (received lasix x1 overnight). - improved with IVF, follow  Supraventricular Tachycardia - appreciate cardiology assistance -> paroxysmal atrial tachycardia vs MAT  - metop PO 50 mg BID -> cards recommending transition to toprol xl when stabilized - prn metop for sustained HR > 120 - follow with IVF -Cardiology consult, appreciate recs --follow echo - notable for decreased EF, appreciate cards recs  HFrEF: does not appear volume overloaded. New onset, possibly 2/2 stress with covid infection and bacteremia.   Cardiology recs appreciated - restart losartan low dose, titrate as able, transition beta blocker to toprol on d/c    Elevated D dimer: follow LE [place] (negative for DVT) and CT PE protocol (without PE)  Acute hypernatremia - resolved, follow with IVF  Abnormal TFT: as TSH not completely suppressed and repeat free T4 borderline elevated - suspect this is abnormal in setting of acute illness, follow   Folate Deficiency  B12 Deficiency: supplement    Essential hypertension -prn metop, BP ok  - metoprolol 50 mg BID, losartan 25 --per med rec, not taking home meds    COPD (chronic obstructive pulmonary disease) (HCC) -Albuterol inhaler scheduled and as needed as per treatment of Covid    Parkinson's variant of multiple system atrophy (HCC) -Chronic and stable    CAD (coronary artery disease) -.  Stable -Continue aspirin, metoprolol, rosuvastatin   DVT prophylaxis: lovenox Code Status: full  Family Communication: none at bedside - son over phone 12/21 and wife 12/21 Disposition:   Status is: Inpatient  Remains inpatient  appropriate because:Inpatient level of care appropriate due to severity of illness   Dispo: The patient is from: Home              Anticipated d/c is to: pending              Anticipated d/c date is: > 3 days              Patient currently is not medically stable to d/c.       Consultants:   none  Procedures:  none  Antimicrobials: Anti-infectives (From admission, onward)   Start     Dose/Rate Route Frequency Ordered Stop   04/01/20 1600  ceFAZolin (ANCEF) IVPB 2g/100 mL premix        2 g 200 mL/hr over 30 Minutes Intravenous Every 8 hours 04/01/20 1513     04/01/20 1400  ceFAZolin (ANCEF) 2 g in dextrose 5 % 100 mL IVPB  Status:  Discontinued        2 g 240 mL/hr over 30 Minutes Intravenous Every 8 hours 04/01/20 0833 04/01/20 1513   03/28/20 1000  cefTRIAXone (ROCEPHIN) 2 g in sodium chloride 0.9 % 100 mL IVPB  Status:  Discontinued        2  g 200 mL/hr over 30 Minutes Intravenous Every 24 hours 03/27/20 0827 03/27/20 1557   03/27/20 2000  ceFAZolin (ANCEF) IVPB 2g/100 mL premix  Status:  Discontinued        2 g 200 mL/hr over 30 Minutes Intravenous Every 8 hours 03/27/20 1559 04/01/20 0833   03/27/20 1000  remdesivir 100 mg in sodium chloride 0.9 % 100 mL IVPB       "Followed by" Linked Group Details   100 mg 200 mL/hr over 30 Minutes Intravenous Daily 03/26/20 0146 03/30/20 1015   03/27/20 0930  cefTRIAXone (ROCEPHIN) 1 g in sodium chloride 0.9 % 100 mL IVPB        1 g 200 mL/hr over 30 Minutes Intravenous  Once 03/27/20 0830 03/27/20 1124   03/26/20 2300  azithromycin (ZITHROMAX) 500 mg in sodium chloride 0.9 % 250 mL IVPB  Status:  Discontinued        500 mg 250 mL/hr over 60 Minutes Intravenous Every 24 hours 03/26/20 0218 03/26/20 1448   03/26/20 2200  cefTRIAXone (ROCEPHIN) 1 g in sodium chloride 0.9 % 100 mL IVPB  Status:  Discontinued        1 g 200 mL/hr over 30 Minutes Intravenous Every 24 hours 03/26/20 2053 03/27/20 0826   03/26/20 2200  azithromycin  (ZITHROMAX) 500 mg in sodium chloride 0.9 % 250 mL IVPB  Status:  Discontinued        500 mg 250 mL/hr over 60 Minutes Intravenous Every 24 hours 03/26/20 2053 03/27/20 1559   03/26/20 0300  cefTRIAXone (ROCEPHIN) 1 g in sodium chloride 0.9 % 100 mL IVPB  Status:  Discontinued        1 g 200 mL/hr over 30 Minutes Intravenous Every 24 hours 03/26/20 0218 03/26/20 1448   03/26/20 0145  remdesivir 200 mg in sodium chloride 0.9% 250 mL IVPB       "Followed by" Linked Group Details   200 mg 580 mL/hr over 30 Minutes Intravenous Once 03/26/20 0146 03/26/20 0556   03/21/2020 2245  levofloxacin (LEVAQUIN) IVPB 750 mg        750 mg 100 mL/hr over 90 Minutes Intravenous  Once 04/07/2020 2244 03/26/20 0039         Subjective: Adalie Mand&Ox2 (with prompting, knew hospitali) Denies complaint  Objective: Vitals:   04/01/20 0500 04/01/20 0933 04/01/20 1242 04/01/20 1521  BP: 108/66 90/69 (!) 91/50 109/64  Pulse: 90 80 83 90  Resp: 19 (!) 28 16 18   Temp: 98 F (36.7 C) (!) 97.5 F (36.4 C) 97.8 F (36.6 C) (!) 97.3 F (36.3 C)  TempSrc:      SpO2: 95% 100% 97% 100%  Weight: 62.5 kg     Height:        Intake/Output Summary (Last 24 hours) at 04/01/2020 1647 Last data filed at 03/31/2020 2117 Gross per 24 hour  Intake 220 ml  Output --  Net 220 ml   Filed Weights   03/30/20 0540 03/31/20 0500 04/01/20 0500  Weight: 62.8 kg 62.7 kg 62.5 kg    Examination:  General: No acute distress. Cachetic, chronically ill appearing. Cardiovascular: Heart sounds show Jakarri Lesko regular rate, and rhythm. Lungs: Clear to auscultation bilaterally  Abdomen: Soft, nontender, nondistended  Neurological: Alert and oriented 3. Moves all extremities 4. Cranial nerves II through XII grossly intact. Skin: Warm and dry. No rashes or lesions. Extremities: No clubbing or cyanosis. No edema  Data Reviewed: I have personally reviewed following labs and imaging studies  CBC:  Recent Labs  Lab 03/28/20 0245 03/29/20 0617  03/30/20 0426 03/31/20 0417 04/01/20 0543  WBC 11.9* 12.1* 14.6* 15.1* 18.8*  NEUTROABS 10.1* 11.0* 13.3* 13.6* 15.2*  HGB 13.5 13.8 14.2 14.0 12.5*  HCT 40.9 39.6 40.4 40.1 35.7*  MCV 90.3 88.6 86.9 85.3 86.0  PLT 203 212 200 224 [id number]    Basic Metabolic Panel: Recent Labs  Lab 03/27/20 0659 03/28/20 0245 03/29/20 0617 03/30/20 0426 03/31/20 0417 04/01/20 0543  NA  --  146* 139 136 136 141  K  --  3.6 3.4* 3.5 3.6 3.4*  CL  --  109 103 101 100 107  CO2  --  28 29 28 26 27   GLUCOSE  --  127* 203* 161* 121* 118*  BUN  --  25* 18 14 20  37*  CREATININE  --  0.59* 0.50* 0.46* 0.52* 0.74  CALCIUM  --  7.6* 7.3* 7.4* 7.5* 7.7*  MG 2.0 2.1  --   --   --  2.2  PHOS  --  2.4*  --   --   --  1.4*    GFR: Estimated Creatinine Clearance: 68.4 mL/min (by C-G formula based on SCr of 0.74 mg/dL).  Liver Function Tests: Recent Labs  Lab 03/28/20 0245 03/29/20 0617 03/30/20 0426 03/31/20 0417 04/01/20 0543  AST 27 21 24 26 24   ALT 13 12 12 14 11   ALKPHOS 42 45 54 60 53  BILITOT 1.0 1.1 1.1 1.3* 0.9  PROT 5.3* 5.0* 5.2* 4.9* 4.6*  ALBUMIN 2.5* 2.4* 2.5* 2.3* 2.2*    CBG: Recent Labs  Lab 03/31/20 1151 03/31/20 1721 03/31/20 2010 04/01/20 0916 04/01/20 1243  GLUCAP 138* 101* 121* 130* 111*     Recent Results (from the past 240 hour(s))  Blood culture (routine single)     Status: Abnormal   Collection Time: 03/26/2020  9:58 PM   Specimen: BLOOD  Result Value Ref Range Status   Specimen Description   Final    BLOOD BLOOD RIGHT FOREARM Performed at [facility], [address] [place], [place] [address]    Special Requests   Final    BOTTLES DRAWN AEROBIC AND ANAEROBIC Blood Culture adequate volume Performed at [facility], [address] [place], [place] [address]    Culture  Setup Time   Final    GRAM POSITIVE COCCI ANAEROBIC BOTTLE ONLY Organism ID to follow CRITICAL RESULT CALLED TO, READ BACK BY AND VERIFIED WITH: JASON ROBBINS AT  [id number] 03/27/20 [place] Performed at [facility], [address] [place], [place] [address]    Culture STAPHYLOCOCCUS AUREUS (Kevis Qu)  Final   Report Status 03/29/2020 FINAL  Final   Organism ID, Bacteria STAPHYLOCOCCUS AUREUS  Final      Susceptibility   Staphylococcus aureus - MIC*    CIPROFLOXACIN <=0.5 SENSITIVE Sensitive     ERYTHROMYCIN <=0.25 SENSITIVE Sensitive     GENTAMICIN <=0.5 SENSITIVE Sensitive     OXACILLIN 0.5 SENSITIVE Sensitive     TETRACYCLINE <=1 SENSITIVE Sensitive     VANCOMYCIN 1 SENSITIVE Sensitive     TRIMETH/SULFA <=10 SENSITIVE Sensitive     CLINDAMYCIN <=0.25 SENSITIVE Sensitive     RIFAMPIN <=0.5 SENSITIVE Sensitive     Inducible Clindamycin NEGATIVE Sensitive     * STAPHYLOCOCCUS AUREUS  Resp Panel by RT-PCR (Flu Darely Becknell&B, Covid) Nasopharyngeal Swab     Status: Abnormal   Collection Time: 03/31/2020  9:58 PM   Specimen: Nasopharyngeal Swab; Nasopharyngeal(NP) swabs in vial transport  medium  Result Value Ref Range Status   SARS Coronavirus 2 by RT PCR POSITIVE (Abiha Lukehart) NEGATIVE Final    Comment: RESULT CALLED TO, READ BACK BY AND VERIFIED WITH: JEN AGNEW RN [id number] 03/19/2020 (NOTE) SARS-CoV-2 target nucleic acids are DETECTED.  The SARS-CoV-2 RNA is generally detectable in upper respiratory specimens during the acute phase of infection. Positive results are indicative of the presence of the identified virus, but do not rule out bacterial infection or co-infection with other pathogens not detected by the test. Clinical correlation with patient history and other diagnostic information is necessary to determine patient infection status. The expected result is Negative.  Fact Sheet for Patients: EntrepreneurPulse.com.au  Fact Sheet for Healthcare Providers: IncredibleEmployment.be  This test is not yet approved or cleared by the [place] FDA and  has been authorized for detection and/or diagnosis of SARS-CoV-2 by FDA  under an Emergency Use Authorization (EUA).  This EUA will remain in effect (meaning this test can be used)  for the duration of  the COVID-19 declaration under Section 564(b)(1) of the Act, 21 U.S.C. section 360bbb-3(b)(1), unless the authorization is terminated or revoked sooner.     Influenza [facility] [place] by PCR NEGATIVE NEGATIVE Final   Influenza B by PCR NEGATIVE NEGATIVE Final    Comment: (NOTE) The Xpert Xpress SARS-CoV-2/FLU/RSV plus assay is intended as an aid in the diagnosis of influenza from Nasopharyngeal swab specimens and should not be used as Earnstine Meinders sole basis for treatment. Nasal washings and aspirates are unacceptable for Xpert Xpress SARS-CoV-2/FLU/RSV testing.  Fact Sheet for Patients: EntrepreneurPulse.com.au  Fact Sheet for Healthcare Providers: IncredibleEmployment.be  This test is not yet approved or cleared by the [place] FDA and has been authorized for detection and/or diagnosis of SARS-CoV-2 by FDA under an Emergency Use Authorization (EUA). This EUA will remain in effect (meaning this test can be used) for the duration of the COVID-19 declaration under Section 564(b)(1) of the Act, 21 U.S.C. section 360bbb-3(b)(1), unless the authorization is terminated or revoked.  Performed at [facility], [address] [place], [place] [address]   Blood Culture ID Panel (Reflexed)     Status: Abnormal   Collection Time: 03/27/2020  9:58 PM  Result Value Ref Range Status   Enterococcus faecalis NOT DETECTED NOT DETECTED Final   Enterococcus Faecium NOT DETECTED NOT DETECTED Final   Listeria monocytogenes NOT DETECTED NOT DETECTED Final   Staphylococcus species DETECTED (Genella Bas) NOT DETECTED Final    Comment: CRITICAL RESULT CALLED TO, READ BACK BY AND VERIFIED WITH:  JASON ROBBINS AT 0509 03/27/20 SDR    Staphylococcus aureus (BCID) DETECTED (Eann Cleland) NOT DETECTED Final    Comment: CRITICAL RESULT CALLED TO, READ BACK BY AND VERIFIED  WITH:  JASON ROBBINS AT 0509 03/27/20 SDR    Staphylococcus epidermidis NOT DETECTED NOT DETECTED Final   Staphylococcus lugdunensis NOT DETECTED NOT DETECTED Final   Streptococcus species NOT DETECTED NOT DETECTED Final   Streptococcus agalactiae NOT DETECTED NOT DETECTED Final   Streptococcus pneumoniae NOT DETECTED NOT DETECTED Final   Streptococcus pyogenes NOT DETECTED NOT DETECTED Final   Lonita Debes.calcoaceticus-baumannii NOT DETECTED NOT DETECTED Final   Bacteroides fragilis NOT DETECTED NOT DETECTED Final   Enterobacterales NOT DETECTED NOT DETECTED Final   Enterobacter cloacae complex NOT DETECTED NOT DETECTED Final   Escherichia coli NOT DETECTED NOT DETECTED Final   Klebsiella aerogenes NOT DETECTED NOT DETECTED Final   Klebsiella oxytoca NOT DETECTED NOT DETECTED Final   Klebsiella pneumoniae NOT DETECTED NOT DETECTED  Final   Proteus species NOT DETECTED NOT DETECTED Final   Salmonella species NOT DETECTED NOT DETECTED Final   Serratia marcescens NOT DETECTED NOT DETECTED Final   Haemophilus influenzae NOT DETECTED NOT DETECTED Final   Neisseria meningitidis NOT DETECTED NOT DETECTED Final   Pseudomonas aeruginosa NOT DETECTED NOT DETECTED Final   Stenotrophomonas maltophilia NOT DETECTED NOT DETECTED Final   Candida albicans NOT DETECTED NOT DETECTED Final   Candida auris NOT DETECTED NOT DETECTED Final   Candida glabrata NOT DETECTED NOT DETECTED Final   Candida krusei NOT DETECTED NOT DETECTED Final   Candida parapsilosis NOT DETECTED NOT DETECTED Final   Candida tropicalis NOT DETECTED NOT DETECTED Final   Cryptococcus neoformans/gattii NOT DETECTED NOT DETECTED Final   Meth resistant mecA/C and MREJ NOT DETECTED NOT DETECTED Final    Comment: Performed at [facility], [address] [place], [place] [address]  CULTURE, BLOOD (ROUTINE X 2) w Reflex to ID Panel     Status: None   Collection Time: 03/27/20  7:33 AM   Specimen: BLOOD  Result Value Ref Range Status    Specimen Description BLOOD LEFT HAND  Final   Special Requests   Final    BOTTLES DRAWN AEROBIC AND ANAEROBIC Blood Culture results may not be optimal due to an excessive volume of blood received in culture bottles   Culture   Final    NO GROWTH 5 DAYS Performed at [facility], [address] [place], [place] [address]    Report Status 04/01/2020 FINAL  Final  CULTURE, BLOOD (ROUTINE X 2) w Reflex to ID Panel     Status: None   Collection Time: 03/27/20  7:35 AM   Specimen: BLOOD  Result Value Ref Range Status   Specimen Description BLOOD RIGHT HAND  Final   Special Requests   Final    BOTTLES DRAWN AEROBIC AND ANAEROBIC Blood Culture results may not be optimal due to an excessive volume of blood received in culture bottles   Culture   Final    NO GROWTH 5 DAYS Performed at [facility], [address] [place], [place] [address]    Report Status 04/01/2020 FINAL  Final  MRSA PCR Screening     Status: Abnormal   Collection Time: 03/27/20 12:00 PM   Specimen: Nasopharyngeal  Result Value Ref Range Status   MRSA by PCR POSITIVE (Hence Derrick) NEGATIVE Final    Comment:        The GeneXpert MRSA Assay (FDA approved for NASAL specimens only), is one component of Tobby Fawcett comprehensive MRSA colonization surveillance program. It is not intended to diagnose MRSA infection nor to guide or monitor treatment for MRSA infections. RESULT CALLED TO, READ BACK BY AND VERIFIED WITHMaryland Pink AT [id number] 03/27/20 SDR Performed at [facility], [address] [place], [place] [address]   Urine culture     Status: None   Collection Time: 03/27/20 12:08 PM   Specimen: In/Out Cath Urine  Result Value Ref Range Status   Specimen Description   Final    IN/OUT CATH URINE Performed at [facility], [address] [place], [place] [address]    Special Requests   Final    NONE Performed at [facility], [address], [place], [place] [address]     Culture   Final    NO GROWTH Performed at [facility], [place] [address] [place], [place] [address]    Report Status 03/29/2020  FINAL  Final         Radiology Studies: No results found.      Scheduled Meds: . vitamin C  500 mg Oral Daily  . aspirin EC  81 mg Oral Daily  . chlorhexidine  15 mL Mouth Rinse BID  . cyanocobalamin  1,000 mcg Intramuscular Daily   Followed by  . [START ON 04/03/2020] vitamin B-12  1,000 mcg Oral Daily  . enoxaparin (LOVENOX) injection  40 mg Subcutaneous Q24H  . feeding supplement (NEPRO CARB STEADY)  237 mL Oral TID BM  . folic acid  1 mg Oral Daily  . insulin aspart  0-5 Units Subcutaneous QHS  . insulin aspart  0-9 Units Subcutaneous TID WC  . linagliptin  5 mg Oral Daily  . losartan  25 mg Oral Daily  . mouth rinse  15 mL Mouth Rinse q12n4p  . metoprolol tartrate  50 mg Oral BID  . multivitamin with minerals  1 tablet Oral Daily  . mupirocin ointment  1 application Nasal BID  . nystatin  5 mL Oral QID  . phosphorus  500 mg Oral Once  . rosuvastatin  5 mg Oral Daily  . [START ON 04/02/2020] thiamine  100 mg Oral Daily  . zinc sulfate  220 mg Oral Daily   Continuous Infusions: . sodium chloride 1,000 mL (04/01/20 0635)  .  ceFAZolin (ANCEF) IV       LOS: 6 days    Time spent: over 30 min    Fayrene Helper, MD [facility]   To contact the attending provider between 7A-7P or the covering provider during after hours 7P-7A, please log into the web site [email or web address] and access using universal  password for that web site. If you do not have the password, please call the hospital operator.  04/01/2020, 4:47 PM

## 2020-04-02 DIAGNOSIS — G9341 Metabolic encephalopathy: Secondary | ICD-10-CM | POA: Diagnosis not present

## 2020-04-02 DIAGNOSIS — R7881 Bacteremia: Secondary | ICD-10-CM | POA: Diagnosis not present

## 2020-04-02 DIAGNOSIS — Z515 Encounter for palliative care: Secondary | ICD-10-CM | POA: Diagnosis not present

## 2020-04-02 DIAGNOSIS — U071 COVID-19: Secondary | ICD-10-CM | POA: Diagnosis not present

## 2020-04-02 DIAGNOSIS — Z7189 Other specified counseling: Secondary | ICD-10-CM | POA: Diagnosis not present

## 2020-04-02 LAB — HEMOGLOBIN AND HEMATOCRIT, BLOOD
HCT: 25.6 % — ABNORMAL LOW (ref 39.0–52.0)
HCT: 24 % — ABNORMAL LOW (ref 39.0–52.0)
Hemoglobin: 8.5 g/dL — ABNORMAL LOW (ref 13.0–17.0)
Hemoglobin: 7.9 g/dL — ABNORMAL LOW (ref 13.0–17.0)

## 2020-04-02 LAB — CBC WITH DIFFERENTIAL/PLATELET
Abs Immature Granulocytes: 0.31 10*3/uL — ABNORMAL HIGH (ref 0.00–0.07)
Basophils Absolute: 0 10*3/uL (ref 0.0–0.1)
Basophils Relative: 0 %
Eosinophils Absolute: 0 10*3/uL (ref 0.0–0.5)
Eosinophils Relative: 0 %
HCT: 30.2 % — ABNORMAL LOW (ref 39.0–52.0)
Hemoglobin: 10.1 g/dL — ABNORMAL LOW (ref 13.0–17.0)
Immature Granulocytes: 2 %
Lymphocytes Relative: 5 %
Lymphs Abs: 1.1 10*3/uL (ref 0.7–4.0)
MCH: 30.1 pg (ref 26.0–34.0)
MCHC: 33.4 g/dL (ref 30.0–36.0)
MCV: 89.9 fL (ref 80.0–100.0)
Monocytes Absolute: 1.4 10*3/uL — ABNORMAL HIGH (ref 0.1–1.0)
Monocytes Relative: 7 %
Neutro Abs: 18.3 10*3/uL — ABNORMAL HIGH (ref 1.7–7.7)
Neutrophils Relative %: 86 %
Platelets: 255 10*3/uL (ref 150–400)
RBC: 3.36 MIL/uL — ABNORMAL LOW (ref 4.22–5.81)
RDW: 14.6 % (ref 11.5–15.5)
WBC: 21.1 10*3/uL — ABNORMAL HIGH (ref 4.0–10.5)
nRBC: 0 % (ref 0.0–0.2)

## 2020-04-02 LAB — COMPREHENSIVE METABOLIC PANEL
ALT: 15 U/L (ref 0–44)
AST: 30 U/L (ref 15–41)
Albumin: 2.2 g/dL — ABNORMAL LOW (ref 3.5–5.0)
Alkaline Phosphatase: 51 U/L (ref 38–126)
Anion gap: 10 (ref 5–15)
BUN: 62 mg/dL — ABNORMAL HIGH (ref 8–23)
CO2: 27 mmol/L (ref 22–32)
Calcium: 7.6 mg/dL — ABNORMAL LOW (ref 8.9–10.3)
Chloride: 110 mmol/L (ref 98–111)
Creatinine, Ser: 0.82 mg/dL (ref 0.61–1.24)
GFR, Estimated: >60 mL/min (ref >60)
Glucose, Bld: 145 mg/dL — ABNORMAL HIGH (ref 70–99)
Potassium: 3.9 mmol/L (ref 3.5–5.1)
Sodium: 147 mmol/L — ABNORMAL HIGH (ref 135–145)
Total Bilirubin: 1.1 mg/dL (ref 0.3–1.2)
Total Protein: 4.6 g/dL — ABNORMAL LOW (ref 6.5–8.1)

## 2020-04-02 LAB — GLUCOSE, CAPILLARY
Glucose-Capillary: 123 mg/dL — ABNORMAL HIGH (ref 70–99)
Glucose-Capillary: 131 mg/dL — ABNORMAL HIGH (ref 70–99)
Glucose-Capillary: 125 mg/dL — ABNORMAL HIGH (ref 70–99)

## 2020-04-02 LAB — MAGNESIUM: Magnesium: 2.4 mg/dL (ref 1.7–2.4)

## 2020-04-02 LAB — PHOSPHORUS: Phosphorus: 2.7 mg/dL (ref 2.5–4.6)

## 2020-04-02 MED ORDER — LOSARTAN POTASSIUM 25 MG PO TABS: 12.5000 mg | ORAL_TABLET | Freq: Every day | ORAL | Status: DC

## 2020-04-02 MED ORDER — LORAZEPAM 2 MG/ML IJ SOLN
1.0000 mg | Freq: Once | INTRAMUSCULAR | Status: AC
  Administered 2020-04-02: 23:00:00 1 mg via INTRAVENOUS
  Filled 2020-04-02: qty 1

## 2020-04-02 NOTE — Progress Notes (Signed)
Daily Progress Note   Patient Name: [name]       Date: 04/02/2020 DOB: 05-05-1940  Age: 79 y.o. MRN#: [id number] Attending Physician: Jennye Boroughs, MD Primary Care Physician: Idelle Crouch, MD Admit Date: 03/29/2020  Reason for Consultation/Follow-up: Establishing goals of care  Subjective: Patient is on covid isolation. Patient is confused. Spoke with his wife. She discusses her diagnosis and admission for covid just prior to his admission. She states she is improving slowly herself.  She states she spoke with the attending team yesterday, and has been kept well updated by them and patient's son [name]. She states she understands her husband may die. She states "I know he won't eat" and "he's confused and don't know anything."  She states he is a good man and she hates to lose him. She states he would not want any form of life support, or feeding tube. She states he has been clear that when it is his time to go, he is ready to go. Discussed care moving forward. She states she wants to try to give it another week. Discussed that he could die before then and she states she is aware of this. She revises her statement to say she would like to give him through the weekend to see how he does, and states if he declines prior to that time frame, to call her back and she will speak to the patient's children.     Length of Stay: 7  Current Medications: Scheduled Meds:  . vitamin C  500 mg Oral Daily  . chlorhexidine  15 mL Mouth Rinse BID  . cyanocobalamin  1,000 mcg Intramuscular Daily   Followed by  . [START ON 04/03/2020] vitamin B-12  1,000 mcg Oral Daily  . feeding supplement (NEPRO CARB STEADY)  237 mL Oral TID BM  . folic acid  1 mg Oral Daily  . insulin aspart  0-5 Units  Subcutaneous QHS  . insulin aspart  0-9 Units Subcutaneous TID WC  . linagliptin  5 mg Oral Daily  . mouth rinse  15 mL Mouth Rinse q12n4p  . metoprolol tartrate  50 mg Oral BID  . multivitamin with minerals  1 tablet Oral Daily  . nystatin  5 mL Oral QID  . [START ON 04/05/2020] pantoprazole  40 mg Intravenous Q12H  .  phosphorus  500 mg Oral Once  . rosuvastatin  5 mg Oral Daily  . thiamine  100 mg Oral Daily  . zinc sulfate  220 mg Oral Daily    Continuous Infusions: . sodium chloride 1,000 mL (04/01/20 0635)  .  ceFAZolin (ANCEF) IV 2 g (04/02/20 1455)  . pantoprozole (PROTONIX) infusion 8 mg/hr (04/02/20 0811)    PRN Meds: sodium chloride, acetaminophen **OR** acetaminophen, chlorpheniramine-HYDROcodone, guaiFENesin-dextromethorphan, levalbuterol, metoprolol tartrate, morphine injection, ondansetron **OR** ondansetron (ZOFRAN) IV        Vital Signs: BP (!) 124/112 (BP Location: Left Arm)   Pulse 82   Temp (!) 97.2 F (36.2 C)   Resp 17   Ht 6' (1.829 m)   Wt 62.5 kg   SpO2 92%   BMI 18.69 kg/m  SpO2: SpO2: 92 % O2 Device: O2 Device: NRB O2 Flow Rate: O2 Flow Rate (L/min): 15 L/min  Intake/output summary:   Intake/Output Summary (Last 24 hours) at 04/02/2020 1521 Last data filed at 04/01/2020 2000 Gross per 24 hour  Intake 395.09 ml  Output 132 ml  Net 263.09 ml   LBM: Last BM Date: 04/02/20 Baseline Weight: Weight: 59 kg Most recent weight: Weight: 62.5 kg       Palliative Assessment/Data:    Flowsheet Rows   Flowsheet Row Most Recent Value  Intake Tab   Referral Department Hospitalist  Unit at Time of Referral Intermediate Care Unit  Palliative Care Primary Diagnosis Sepsis/Infectious Disease  Date Notified 03/26/20  Palliative Care Type New Palliative care  Reason for referral Clarify Goals of Care  Date of Admission 04/10/2020  Date first seen by Palliative Care 03/27/20  # of days Palliative referral response time 1 Day(s)  # of days IP prior  to Palliative referral 1  Clinical Assessment   Palliative Performance Scale Score 20%  Psychosocial & Spiritual Assessment   Palliative Care Outcomes   Patient/Family meeting held? Yes  Who was at the meeting? son  Palliative Care Outcomes Clarified goals of care, Provided psychosocial or spiritual support      Patient Active Problem List   Diagnosis Date Noted  . MSSA bacteremia   . Goals of care, counseling/discussion   . Palliative care by specialist   . DNR (do not resuscitate)   . Severe sepsis ([place]) 03/26/2020  . Pneumonia due to COVID-19 virus 03/26/2020  . Acute respiratory failure due to COVID-19 ([place]) 03/26/2020  . Acute hypernatremia 03/26/2020  . Rapid atrial fibrillation, new onset ([place]) 03/26/2020  . Acute metabolic encephalopathy [id number]  . Bacterial pneumonia 03/26/2020  . Sepsis ([place]) 03/26/2020  . Atherosclerosis of lower extremity with claudication ([place]) 09/13/2017  . COPD exacerbation ([place]) 08/09/2017  . Hypokalemia 06/20/2016  . Syncope   . CAD (coronary artery disease) 06/19/2016  . Status post coronary artery stent placement   . PSVT (paroxysmal supraventricular tachycardia) ([place])   . NSVT (nonsustained ventricular tachycardia) ([place])   . NSTEMI (non-ST elevated myocardial infarction) ([place])   . Parkinson's variant of multiple system atrophy ([place]) 12/19/2015  . Encephalopathy chronic 12/14/2015  . Chest pain   . COPD (chronic obstructive pulmonary disease) ([place])   . Heart disease   . Seizure disorder ([place])   . Depression   . Tardive dyskinesia   . Dysphagia   . Skin lesion 08/14/2015  . SOB (shortness of breath) 08/14/2015  . Essential hypertension 08/14/2015  . Hyperlipidemia 08/14/2015  . Essential tremor 08/14/2015    Palliative Care Assessment & Plan   Recommendations/Plan:  Wife would like to see how he does through the weekend and make decisions from there. She verbalizes her understanding that he could die prior to the end of the  weekend.     Code Status:    Code Status Orders  (From admission, onward)         Start     Ordered   03/27/20 0910  Do not attempt resuscitation (DNR)  Continuous       Question Answer Comment  In the event of cardiac or respiratory ARREST Do not call a "code blue"   In the event of cardiac or respiratory ARREST Do not perform Intubation, CPR, defibrillation or ACLS   In the event of cardiac or respiratory ARREST Use medication by any route, position, wound care, and other measures to relive pain and suffering. May use oxygen, suction and manual treatment of airway obstruction as needed for comfort.      03/27/20 0910        Code Status History    Date Active Date Inactive Code Status Order ID Comments User Context   03/26/2020 0147 03/27/2020 0910 Full Code [id number]  Athena Masse, MD ED   08/09/2017 1416 08/10/2017 2047 Full Code [id number]  Gladstone Lighter, MD Inpatient   06/19/2016 1240 06/20/2016 1942 Full Code [id number]  Maren Reamer, MD ED   01/17/2016 1744 01/21/2016 1516 DNR [id number]  Isaiah Serge, NP ED   12/23/2015 1303 01/17/2016 1140 DNR [id number]  Ludwig Clarks, DO Outpatient   12/14/2015 1340 12/16/2015 1602 DNR [id number]  Radene Gunning, NP Inpatient   12/14/2015 1238 12/14/2015 1340 Full Code [id number]  Radene Gunning, NP ED   Advance Care Planning Activity       Prognosis:   Poor overall   Thank you for allowing the Palliative Medicine Team to assist in the care of this patient.   Total Time  25 min Prolonged Time Billed  no         Greater than 50%  of this time was spent counseling and coordinating care related to the above assessment and plan.  COVID-19 DISASTER DECLARATION:    PHYSICAL EXAMINATION WAS NOT POSSIBLE DUE TO TREATMENT OF COVID-19  AND CONSERVATION OF PERSONAL PROTECTIVE EQUIPMENT-   Patient assessed or the symptoms described in the history of present illness.  In the context of the Global COVID-19 pandemic, which  necessitated consideration that the patient might be at risk for infection with the SARS-CoV-2 virus that causes COVID-19, Institutional protocols and algorithms that pertain to the evaluation of patients at risk for COVID-19 are in a state of rapid change based on information released by regulatory bodies including the CDC and federal and state organizations. These policies and algorithms were followed during the patient's care while in hospital.  Asencion Gowda, NP  Please contact Palliative Medicine Team phone at [phone] for questions and concerns.

## 2020-04-02 NOTE — Progress Notes (Addendum)
Progress Note    [name]  QIO:[id number] DOB: 1941/01/04  DOA: 04/02/2020 PCP: Idelle Crouch, MD      Brief Narrative:    Medical records reviewed and are as summarized below:  [name] is a 79 y.o. male       Assessment/Plan:   Active Problems:   SOB (shortness of breath)   Essential hypertension   COPD (chronic obstructive pulmonary disease) (HCC)   Seizure disorder (HCC)   Parkinson's variant of multiple system atrophy (HCC)   PSVT (paroxysmal supraventricular tachycardia) ([place])   CAD (coronary artery disease)   Severe sepsis ([place])   Pneumonia due to COVID-19 virus   Acute respiratory failure due to COVID-19 [facility])   Acute hypernatremia   Rapid atrial fibrillation, new onset ([place])   Acute metabolic encephalopathy   Bacterial pneumonia   Sepsis ([place])   Goals of care, counseling/discussion   Palliative care by specialist   DNR (do not resuscitate)   MSSA bacteremia   Nutrition Problem: Increased nutrient needs Etiology: catabolic illness (COPD, COVID 19)  Signs/Symptoms: estimated needs   Body mass index is 18.69 kg/m.  (Underweight)     Severe sepsis/septic shocksecondary to covid 19 viral pneumonia  Acute hypoxic respiratory failure  Superimposed Bacteria Community Acquired Pneumonia  MSSA Bacteremia Completed IV remdesivir. Blood culture on 2020-04-02 showed MSSA.  No growth on repeat culture on 03/27/2020.  ID recommends treatment with IV Ancef for 2 to 3 weeks. - 12/14 blood cx with staph aureus (mssa)  echo (EF 40-45%, global hypokinesis, valves not well visualized), repeat blood cx - endocarditis thought unlikely per ID  He is requiring 15 L/min oxygen via nonrebreather mask for worsening acute hypoxic respiratory failure.  Taper down oxygen as able.  Proning position as tolerated. Use incentive spirometer as able.   Acute GI bleeding Continue IV Protonix infusion.  Hemoglobin is stable.  Continue  to monitor H&H.  Acute metabolic encephalopathy Secondary to acute infection and hypoxia. Ammonia level is within normal limits   Leukocytosis: Increasing WBC.  Continue antibiotics.   Lactic Acidosis:  Likely from sepsis, hypoxia Repeat lactic acid tomorrow   Supraventricular Tachycardia - appreciate cardiology assistance -> paroxysmal atrial tachycardia vs MAT  - metop PO 50 mg BID -> cards recommending transition to toprol xl when stabilized - prn metop for sustained HR > 120 Cardiologist has signed off.  HFrEF: does not appear volume overloaded. New onset Continue metoprolol.  Losartan held because of hypotension.   Elevated D dimer: follow LE [place] (negative for DVT) and CT PE protocol (without PE)  Acute hypernatremia Resolved  Abnormal TFT:  This is likely abnormal because of acute illness.  Outpatient follow-up.   Folate Deficiency  B12 Deficiency:  Continue folic acid and vitamin B12 supplement.  Prolonged QT interval Monitor QT interval on EKG. Avoid antipsychotics including Risperdal for now.  Other comorbidities include COPD, CAD, hypertension, Parkinson's disease   Plan of care was discussed with his son, [name], over the phone.  Goals of care were discussed.  He understands that patient's condition has worsened and he may not do well.  He confirms the patient is DO NOT RESUSCITATE and should not be intubated or placed on mechanical ventilation.  He is yet to make a decision regarding comfort measures.    Diet Order            DIET - DYS 1 Room service appropriate? Yes with Assist; Fluid consistency: Nectar Thick  Diet effective now                    Consultants:  Cardiologist  Infectious disease  Procedures:  None    Medications:   . vitamin C  500 mg Oral Daily  . chlorhexidine  15 mL Mouth Rinse BID  . cyanocobalamin  1,000 mcg Intramuscular Daily   Followed by  . [START ON 04/03/2020] vitamin B-12  1,000 mcg Oral Daily   . feeding supplement (NEPRO CARB STEADY)  237 mL Oral TID BM  . folic acid  1 mg Oral Daily  . insulin aspart  0-5 Units Subcutaneous QHS  . insulin aspart  0-9 Units Subcutaneous TID WC  . linagliptin  5 mg Oral Daily  . mouth rinse  15 mL Mouth Rinse q12n4p  . metoprolol tartrate  50 mg Oral BID  . multivitamin with minerals  1 tablet Oral Daily  . nystatin  5 mL Oral QID  . [START ON 04/05/2020] pantoprazole  40 mg Intravenous Q12H  . phosphorus  500 mg Oral Once  . rosuvastatin  5 mg Oral Daily  . thiamine  100 mg Oral Daily  . zinc sulfate  220 mg Oral Daily   Continuous Infusions: . sodium chloride 1,000 mL (04/01/20 0635)  .  ceFAZolin (ANCEF) IV 2 g (04/02/20 1455)  . pantoprozole (PROTONIX) infusion 8 mg/hr (04/02/20 0811)     Anti-infectives (From admission, onward)   Start     Dose/Rate Route Frequency Ordered Stop   04/01/20 1600  ceFAZolin (ANCEF) IVPB 2g/100 mL premix        2 g 200 mL/hr over 30 Minutes Intravenous Every 8 hours 04/01/20 1513     04/01/20 1400  ceFAZolin (ANCEF) 2 g in dextrose 5 % 100 mL IVPB  Status:  Discontinued        2 g 240 mL/hr over 30 Minutes Intravenous Every 8 hours 04/01/20 0833 04/01/20 1513   03/28/20 1000  cefTRIAXone (ROCEPHIN) 2 g in sodium chloride 0.9 % 100 mL IVPB  Status:  Discontinued        2 g 200 mL/hr over 30 Minutes Intravenous Every 24 hours 03/27/20 0827 03/27/20 1557   03/27/20 2000  ceFAZolin (ANCEF) IVPB 2g/100 mL premix  Status:  Discontinued        2 g 200 mL/hr over 30 Minutes Intravenous Every 8 hours 03/27/20 1559 04/01/20 0833   03/27/20 1000  remdesivir 100 mg in sodium chloride 0.9 % 100 mL IVPB       "Followed by" Linked Group Details   100 mg 200 mL/hr over 30 Minutes Intravenous Daily 03/26/20 0146 03/30/20 1015   03/27/20 0930  cefTRIAXone (ROCEPHIN) 1 g in sodium chloride 0.9 % 100 mL IVPB        1 g 200 mL/hr over 30 Minutes Intravenous  Once 03/27/20 0830 03/27/20 1124   03/26/20 2300   azithromycin (ZITHROMAX) 500 mg in sodium chloride 0.9 % 250 mL IVPB  Status:  Discontinued        500 mg 250 mL/hr over 60 Minutes Intravenous Every 24 hours 03/26/20 0218 03/26/20 1448   03/26/20 2200  cefTRIAXone (ROCEPHIN) 1 g in sodium chloride 0.9 % 100 mL IVPB  Status:  Discontinued        1 g 200 mL/hr over 30 Minutes Intravenous Every 24 hours 03/26/20 2053 03/27/20 0826   03/26/20 2200  azithromycin (ZITHROMAX) 500 mg in sodium chloride 0.9 % 250 mL IVPB  Status:  Discontinued        500 mg 250 mL/hr over 60 Minutes Intravenous Every 24 hours 03/26/20 2053 03/27/20 1559   03/26/20 0300  cefTRIAXone (ROCEPHIN) 1 g in sodium chloride 0.9 % 100 mL IVPB  Status:  Discontinued        1 g 200 mL/hr over 30 Minutes Intravenous Every 24 hours 03/26/20 0218 03/26/20 1448   03/26/20 0145  remdesivir 200 mg in sodium chloride 0.9% 250 mL IVPB       "Followed by" Linked Group Details   200 mg 580 mL/hr over 30 Minutes Intravenous Once 03/26/20 0146 03/26/20 0556   04/01/2020 2245  levofloxacin (LEVAQUIN) IVPB 750 mg        750 mg 100 mL/hr over 90 Minutes Intravenous  Once 03/20/2020 2244 03/26/20 0039             Family Communication/Anticipated D/C date and plan/Code Status   DVT prophylaxis: Place and maintain sequential compression device Start: 04/01/20 1715     Code Status: DNR  Family Communication: Plan discussed with his son, [name] Disposition Plan:    Status is: Inpatient  Remains inpatient appropriate because:IV treatments appropriate due to intensity of illness or inability to take PO and Inpatient level of care appropriate due to severity of illness   Dispo: The patient is from: Home              Anticipated d/c is to: SNF              Anticipated d/c date is: > 3 days              Patient currently is not medically stable to d/c.           Subjective:   Interval events noted.  Patient's condition worsened overnight and was placed on 15 L/min oxygen  via nonrebreather mask.  He is confused and nonverbal, and unable to provide any history.  Objective:    Vitals:   04/02/20 0434 04/02/20 0500 04/02/20 0700 04/02/20 1230  BP:   122/84 (!) 124/112  Pulse: 87  78 82  Resp:    17  Temp:   98.6 F (37 C) (!) 97.2 F (36.2 C)  TempSrc:   Tympanic   SpO2: 92%  92%   Weight:  62.5 kg    Height:       No data found.   Intake/Output Summary (Last 24 hours) at 04/02/2020 1630 Last data filed at 04/01/2020 2000 Gross per 24 hour  Intake 395.09 ml  Output 132 ml  Net 263.09 ml   Filed Weights   03/31/20 0500 04/01/20 0500 04/02/20 0500  Weight: 62.7 kg 62.5 kg 62.5 kg    Exam:  GEN: NAD SKIN: Warm and dry EYES: No pallor or icterus  ENT: MMM CV: RRR PULM: CTA B ABD: soft, ND, NT, +BS CNS: Lethargic, confused, limited exam because of confusion EXT: No edema or tenderness   Data Reviewed:   I have personally reviewed following labs and imaging studies:  Labs: Labs show the following:   Basic Metabolic Panel: Recent Labs  Lab 03/27/20 0659 03/28/20 0245 03/29/20 0617 03/30/20 0426 03/31/20 0417 04/01/20 0543 04/02/20 0526  NA  --  146* 139 136 136 141 147*  K  --  3.6 3.4* 3.5 3.6 3.4* 3.9  CL  --  109 103 101 100 107 110  CO2  --  28 29 28 26 27 27   GLUCOSE  --  127* 203* 161* 121* 118* 145*  BUN  --  25* 18 14 20  37* 62*  CREATININE  --  0.59* 0.50* 0.46* 0.52* 0.74 0.82  CALCIUM  --  7.6* 7.3* 7.4* 7.5* 7.7* 7.6*  MG 2.0 2.1  --   --   --  2.2 2.4  PHOS  --  2.4*  --   --   --  1.4* 2.7   GFR Estimated Creatinine Clearance: 66.7 mL/min (by C-G formula based on SCr of 0.82 mg/dL). Liver Function Tests: Recent Labs  Lab 03/29/20 0617 03/30/20 0426 03/31/20 0417 04/01/20 0543 04/02/20 0526  AST 21 24 26 24 30   ALT 12 12 14 11 15   ALKPHOS 45 54 60 53 51  BILITOT 1.1 1.1 1.3* 0.9 1.1  PROT 5.0* 5.2* 4.9* 4.6* 4.6*  ALBUMIN 2.4* 2.5* 2.3* 2.2* 2.2*   No results for input(s): LIPASE, AMYLASE in  the last 168 hours. Recent Labs  Lab 03/27/20 0659  AMMONIA 16   Coagulation profile No results for input(s): INR, PROTIME in the last 168 hours.  CBC: Recent Labs  Lab 03/29/20 0617 03/30/20 0426 03/31/20 0417 04/01/20 0543 04/01/20 1730 04/01/20 2119 04/02/20 0526 04/02/20 1344  WBC 12.1* 14.6* 15.1* 18.8*  --   --  21.1*  --   NEUTROABS 11.0* 13.3* 13.6* 15.2*  --   --  18.3*  --   HGB 13.8 14.2 14.0 12.5* 10.2* 10.4* 10.1* 8.5*  HCT 39.6 40.4 40.1 35.7* 30.6* 30.6* 30.2* 25.6*  MCV 88.6 86.9 85.3 86.0  --   --  89.9  --   PLT 212 200 224 291  --   --  255  --    Cardiac Enzymes: No results for input(s): CKTOTAL, CKMB, CKMBINDEX, TROPONINI in the last 168 hours. BNP (last 3 results) No results for input(s): PROBNP in the last 8760 hours. CBG: Recent Labs  Lab 04/01/20 1243 04/01/20 1644 04/01/20 1946 04/02/20 0745 04/02/20 1228  GLUCAP 111* 106* 96 123* 131*   D-Dimer: No results for input(s): DDIMER in the last 72 hours. Hgb A1c: No results for input(s): HGBA1C in the last 72 hours. Lipid Profile: No results for input(s): CHOL, HDL, LDLCALC, TRIG, CHOLHDL, LDLDIRECT in the last 72 hours. Thyroid function studies: No results for input(s): TSH, T4TOTAL, T3FREE, THYROIDAB in the last 72 hours.  Invalid input(s): FREET3 Anemia work up: Recent Labs    04/01/20 0543  FERRITIN 198   Sepsis Labs: Recent Labs  Lab 03/27/20 0659 03/27/20 1124 03/27/20 1418 03/28/20 0245 03/28/20 0444 03/29/20 0617 03/30/20 0426 03/31/20 0417 04/01/20 0543 04/02/20 0526  PROCALCITON  --   --   --  <0.10  --   --   --   --   --   --   WBC  --   --   --  11.9*  --    < > 14.6* 15.1* 18.8* 21.1*  LATICACIDVEN 5.5* 4.4* 3.5*  --  2.3*  --   --   --   --   --    < > = values in this interval not displayed.    Microbiology Recent Results (from the past 240 hour(s))  Blood culture (routine single)     Status: Abnormal   Collection Time: 03/16/2020  9:58 PM   Specimen:  BLOOD  Result Value Ref Range Status   Specimen Description   Final    BLOOD BLOOD RIGHT FOREARM Performed at [facility], [address] [place],  [place] [address]    Special Requests   Final    BOTTLES DRAWN AEROBIC AND ANAEROBIC Blood Culture adequate volume Performed at [facility], [address] [place], [place] [address]    Culture  Setup Time   Final    GRAM POSITIVE COCCI ANAEROBIC BOTTLE ONLY Organism ID to follow CRITICAL RESULT CALLED TO, READ BACK BY AND VERIFIED WITH: JASON ROBBINS AT [id number] 03/27/20 SDR Performed at [facility], [address] [place], [place] [address]    Culture STAPHYLOCOCCUS AUREUS (A)  Final   Report Status 03/29/2020 FINAL  Final   Organism ID, Bacteria STAPHYLOCOCCUS AUREUS  Final      Susceptibility   Staphylococcus aureus - MIC*    CIPROFLOXACIN <=0.5 SENSITIVE Sensitive     ERYTHROMYCIN <=0.25 SENSITIVE Sensitive     GENTAMICIN <=0.5 SENSITIVE Sensitive     OXACILLIN 0.5 SENSITIVE Sensitive     TETRACYCLINE <=1 SENSITIVE Sensitive     VANCOMYCIN 1 SENSITIVE Sensitive     TRIMETH/SULFA <=10 SENSITIVE Sensitive     CLINDAMYCIN <=0.25 SENSITIVE Sensitive     RIFAMPIN <=0.5 SENSITIVE Sensitive     Inducible Clindamycin NEGATIVE Sensitive     * STAPHYLOCOCCUS AUREUS  Resp Panel by RT-PCR (Flu A&B, Covid) Nasopharyngeal Swab     Status: Abnormal   Collection Time: 04/01/2020  9:58 PM   Specimen: Nasopharyngeal Swab; Nasopharyngeal(NP) swabs in vial transport medium  Result Value Ref Range Status   SARS Coronavirus 2 by RT PCR POSITIVE (A) NEGATIVE Final    Comment: RESULT CALLED TO, READ BACK BY AND VERIFIED WITH: JEN AGNEW RN [id number] 03/13/2020 (NOTE) SARS-CoV-2 target nucleic acids are DETECTED.  The SARS-CoV-2 RNA is generally detectable in upper respiratory specimens during the acute phase of infection. Positive results are indicative of the presence of the identified virus, but do not rule out bacterial  infection or co-infection with other pathogens not detected by the test. Clinical correlation with patient history and other diagnostic information is necessary to determine patient infection status. The expected result is Negative.  Fact Sheet for Patients: EntrepreneurPulse.com.au  Fact Sheet for Healthcare Providers: IncredibleEmployment.be  This test is not yet approved or cleared by the [place] FDA and  has been authorized for detection and/or diagnosis of SARS-CoV-2 by FDA under an Emergency Use Authorization (EUA).  This EUA will remain in effect (meaning this test can be used)  for the duration of  the COVID-19 declaration under Section 564(b)(1) of the Act, 21 U.S.C. section 360bbb-3(b)(1), unless the authorization is terminated or revoked sooner.     Influenza A by PCR NEGATIVE NEGATIVE Final   Influenza B by PCR NEGATIVE NEGATIVE Final    Comment: (NOTE) The Xpert Xpress SARS-CoV-2/FLU/RSV plus assay is intended as an aid in the diagnosis of influenza from Nasopharyngeal swab specimens and should not be used as a sole basis for treatment. Nasal washings and aspirates are unacceptable for Xpert Xpress SARS-CoV-2/FLU/RSV testing.  Fact Sheet for Patients: EntrepreneurPulse.com.au  Fact Sheet for Healthcare Providers: IncredibleEmployment.be  This test is not yet approved or cleared by the [place] FDA and has been authorized for detection and/or diagnosis of SARS-CoV-2 by FDA under an Emergency Use Authorization (EUA). This EUA will remain in effect (meaning this test can be used) for the duration of the COVID-19 declaration under Section 564(b)(1) of the Act, 21 U.S.C. section 360bbb-3(b)(1), unless the authorization is terminated or revoked.  Performed at [facility], [place], [place]  [address]   Blood Culture ID Panel (Reflexed)     Status: Abnormal    Collection Time: 03/30/2020  9:58 PM  Result Value Ref Range Status   Enterococcus faecalis NOT DETECTED NOT DETECTED Final   Enterococcus Faecium NOT DETECTED NOT DETECTED Final   Listeria monocytogenes NOT DETECTED NOT DETECTED Final   Staphylococcus species DETECTED (A) NOT DETECTED Final    Comment: CRITICAL RESULT CALLED TO, READ BACK BY AND VERIFIED WITH:  JASON ROBBINS AT 0509 03/27/20 SDR    Staphylococcus aureus (BCID) DETECTED (A) NOT DETECTED Final    Comment: CRITICAL RESULT CALLED TO, READ BACK BY AND VERIFIED WITH:  JASON ROBBINS AT 0509 03/27/20 SDR    Staphylococcus epidermidis NOT DETECTED NOT DETECTED Final   Staphylococcus lugdunensis NOT DETECTED NOT DETECTED Final   Streptococcus species NOT DETECTED NOT DETECTED Final   Streptococcus agalactiae NOT DETECTED NOT DETECTED Final   Streptococcus pneumoniae NOT DETECTED NOT DETECTED Final   Streptococcus pyogenes NOT DETECTED NOT DETECTED Final   A.calcoaceticus-baumannii NOT DETECTED NOT DETECTED Final   Bacteroides fragilis NOT DETECTED NOT DETECTED Final   Enterobacterales NOT DETECTED NOT DETECTED Final   Enterobacter cloacae complex NOT DETECTED NOT DETECTED Final   Escherichia coli NOT DETECTED NOT DETECTED Final   Klebsiella aerogenes NOT DETECTED NOT DETECTED Final   Klebsiella oxytoca NOT DETECTED NOT DETECTED Final   Klebsiella pneumoniae NOT DETECTED NOT DETECTED Final   Proteus species NOT DETECTED NOT DETECTED Final   Salmonella species NOT DETECTED NOT DETECTED Final   Serratia marcescens NOT DETECTED NOT DETECTED Final   Haemophilus influenzae NOT DETECTED NOT DETECTED Final   Neisseria meningitidis NOT DETECTED NOT DETECTED Final   Pseudomonas aeruginosa NOT DETECTED NOT DETECTED Final   Stenotrophomonas maltophilia NOT DETECTED NOT DETECTED Final   Candida albicans NOT DETECTED NOT DETECTED Final   Candida auris NOT DETECTED NOT DETECTED Final   Candida glabrata NOT DETECTED NOT DETECTED Final    Candida krusei NOT DETECTED NOT DETECTED Final   Candida parapsilosis NOT DETECTED NOT DETECTED Final   Candida tropicalis NOT DETECTED NOT DETECTED Final   Cryptococcus neoformans/gattii NOT DETECTED NOT DETECTED Final   Meth resistant mecA/C and MREJ NOT DETECTED NOT DETECTED Final    Comment: Performed at [facility], [address] [place], [place] [address]  CULTURE, BLOOD (ROUTINE X 2) w Reflex to ID Panel     Status: None   Collection Time: 03/27/20  7:33 AM   Specimen: BLOOD  Result Value Ref Range Status   Specimen Description BLOOD LEFT HAND  Final   Special Requests   Final    BOTTLES DRAWN AEROBIC AND ANAEROBIC Blood Culture results may not be optimal due to an excessive volume of blood received in culture bottles   Culture   Final    NO GROWTH 5 DAYS Performed at [facility], [address] [place], [place] [address]    Report Status 04/01/2020 FINAL  Final  CULTURE, BLOOD (ROUTINE X 2) w Reflex to ID Panel     Status: None   Collection Time: 03/27/20  7:35 AM   Specimen: BLOOD  Result Value Ref Range Status   Specimen Description BLOOD RIGHT HAND  Final   Special Requests   Final    BOTTLES DRAWN AEROBIC AND ANAEROBIC Blood Culture results may not be optimal due to an excessive volume of blood received in culture bottles   Culture   Final    NO GROWTH 5 DAYS  Performed at [facility], [address] [place], [place] [address]    Report Status 04/01/2020 FINAL  Final  MRSA PCR Screening     Status: Abnormal   Collection Time: 03/27/20 12:00 PM   Specimen: Nasopharyngeal  Result Value Ref Range Status   MRSA by PCR POSITIVE (A) NEGATIVE Final    Comment:        The GeneXpert MRSA Assay (FDA approved for NASAL specimens only), is one component of a comprehensive MRSA colonization surveillance program. It is not intended to diagnose MRSA infection nor to guide or monitor treatment for MRSA infections. RESULT CALLED TO, READ  BACK BY AND VERIFIED WITHMaryland Pink AT [id number] 03/27/20 SDR Performed at [facility], [address] [place], [place] [address]   Urine culture     Status: None   Collection Time: 03/27/20 12:08 PM   Specimen: In/Out Cath Urine  Result Value Ref Range Status   Specimen Description   Final    IN/OUT CATH URINE Performed at [facility], [address], [place], [place] [address]    Special Requests   Final    NONE Performed at [facility], [address] [place], [place] [address]    Culture   Final    NO GROWTH Performed at [facility], [place] [address], [place],  [address]    Report Status 03/29/2020 FINAL  Final    Procedures and diagnostic studies:  No results found.             LOS: 7 days   Shambria Camerer  [facility]   Pager on [email or web address]. If 7PM-7AM, please contact night-coverage at [email or web address]     04/02/2020, 4:30 PM

## 2020-04-02 NOTE — Progress Notes (Signed)
Heart rate seemed controlled on vitals monitor.  Currently off telemetry.  Continue Lopressor at current dose of 50 mg twice daily.  Goals of care discussions with patient and family ongoing.  No additional input from a cardiac perspective.  Losartan was held due to low normal blood pressures.  Cardiology will sign off.  Please let us know if additional input is needed.  Signed, Kate Sable, M.D. 04/02/20 [place], [place]

## 2020-04-03 DIAGNOSIS — U071 COVID-19: Secondary | ICD-10-CM | POA: Diagnosis not present

## 2020-04-03 DIAGNOSIS — E43 Unspecified severe protein-calorie malnutrition: Secondary | ICD-10-CM

## 2020-04-03 DIAGNOSIS — J440 Chronic obstructive pulmonary disease with acute lower respiratory infection: Secondary | ICD-10-CM

## 2020-04-03 DIAGNOSIS — G9341 Metabolic encephalopathy: Secondary | ICD-10-CM | POA: Diagnosis not present

## 2020-04-03 DIAGNOSIS — E870 Hyperosmolality and hypernatremia: Secondary | ICD-10-CM

## 2020-04-03 DIAGNOSIS — J159 Unspecified bacterial pneumonia: Secondary | ICD-10-CM | POA: Diagnosis not present

## 2020-04-03 DIAGNOSIS — D649 Anemia, unspecified: Secondary | ICD-10-CM

## 2020-04-03 LAB — CBC WITH DIFFERENTIAL/PLATELET
Abs Immature Granulocytes: 0.41 10*3/uL — ABNORMAL HIGH (ref 0.00–0.07)
Basophils Absolute: 0 10*3/uL (ref 0.0–0.1)
Basophils Relative: 0 %
Eosinophils Absolute: 0 10*3/uL (ref 0.0–0.5)
Eosinophils Relative: 0 %
HCT: 23.8 % — ABNORMAL LOW (ref 39.0–52.0)
Hemoglobin: 7.8 g/dL — ABNORMAL LOW (ref 13.0–17.0)
Immature Granulocytes: 2 %
Lymphocytes Relative: 4 %
Lymphs Abs: 0.9 10*3/uL (ref 0.7–4.0)
MCH: 30.5 pg (ref 26.0–34.0)
MCHC: 32.8 g/dL (ref 30.0–36.0)
MCV: 93 fL (ref 80.0–100.0)
Monocytes Absolute: 1.4 10*3/uL — ABNORMAL HIGH (ref 0.1–1.0)
Monocytes Relative: 7 %
Neutro Abs: 18.7 10*3/uL — ABNORMAL HIGH (ref 1.7–7.7)
Neutrophils Relative %: 87 %
Platelets: 198 10*3/uL (ref 150–400)
RBC: 2.56 MIL/uL — ABNORMAL LOW (ref 4.22–5.81)
RDW: 15.6 % — ABNORMAL HIGH (ref 11.5–15.5)
WBC: 21.5 10*3/uL — ABNORMAL HIGH (ref 4.0–10.5)
nRBC: 0.1 % (ref 0.0–0.2)

## 2020-04-03 LAB — ABO/RH: ABO/RH(D): O POS

## 2020-04-03 LAB — HEMOGLOBIN AND HEMATOCRIT, BLOOD
HCT: 22.1 % — ABNORMAL LOW (ref 39.0–52.0)
HCT: 26.5 % — ABNORMAL LOW (ref 39.0–52.0)
Hemoglobin: 7.1 g/dL — ABNORMAL LOW (ref 13.0–17.0)
Hemoglobin: 8.4 g/dL — ABNORMAL LOW (ref 13.0–17.0)

## 2020-04-03 LAB — BLOOD GAS, ARTERIAL
Acid-Base Excess: 5.2 mmol/L — ABNORMAL HIGH (ref 0.0–2.0)
Bicarbonate: 30.4 mmol/L — ABNORMAL HIGH (ref 20.0–28.0)
FIO2: 0.64
O2 Saturation: 99.7 %
Patient temperature: 37
pCO2 arterial: 48 mmHg (ref 32.0–48.0)
pH, Arterial: 7.41 (ref 7.350–7.450)
pO2, Arterial: 196 mmHg — ABNORMAL HIGH (ref 83.0–108.0)

## 2020-04-03 LAB — COMPREHENSIVE METABOLIC PANEL
ALT: 15 U/L (ref 0–44)
AST: 35 U/L (ref 15–41)
Albumin: 2.2 g/dL — ABNORMAL LOW (ref 3.5–5.0)
Alkaline Phosphatase: 50 U/L (ref 38–126)
Anion gap: 5 (ref 5–15)
BUN: 79 mg/dL — ABNORMAL HIGH (ref 8–23)
CO2: 31 mmol/L (ref 22–32)
Calcium: 7.7 mg/dL — ABNORMAL LOW (ref 8.9–10.3)
Chloride: 113 mmol/L — ABNORMAL HIGH (ref 98–111)
Creatinine, Ser: 1.03 mg/dL (ref 0.61–1.24)
GFR, Estimated: >60 mL/min (ref >60)
Glucose, Bld: 166 mg/dL — ABNORMAL HIGH (ref 70–99)
Potassium: 4.4 mmol/L (ref 3.5–5.1)
Sodium: 149 mmol/L — ABNORMAL HIGH (ref 135–145)
Total Bilirubin: 0.7 mg/dL (ref 0.3–1.2)
Total Protein: 4.6 g/dL — ABNORMAL LOW (ref 6.5–8.1)

## 2020-04-03 LAB — GLUCOSE, CAPILLARY
Glucose-Capillary: 52 mg/dL — ABNORMAL LOW (ref 70–99)
Glucose-Capillary: 121 mg/dL — ABNORMAL HIGH (ref 70–99)
Glucose-Capillary: 71 mg/dL (ref 70–99)
Glucose-Capillary: 97 mg/dL (ref 70–99)

## 2020-04-03 LAB — PREPARE RBC (CROSSMATCH)

## 2020-04-03 MED ORDER — DEXTROSE 50 % IV SOLN
INTRAVENOUS | Status: AC
  Filled 2020-04-03: qty 50

## 2020-04-03 MED ORDER — SODIUM CHLORIDE 0.9% IV SOLUTION: Freq: Once | INTRAVENOUS | Status: AC

## 2020-04-03 MED ORDER — KCL IN DEXTROSE-NACL 20-5-0.45 MEQ/L-%-% IV SOLN
INTRAVENOUS | Status: DC
  Filled 2020-04-03 (×3): qty 1000

## 2020-04-03 MED ORDER — DEXTROSE 50 % IV SOLN
25.0000 g | INTRAVENOUS | Status: AC
  Administered 2020-04-03: 08:00:00 25 g via INTRAVENOUS

## 2020-04-03 MED ORDER — HALOPERIDOL LACTATE 5 MG/ML IJ SOLN
2.0000 mg | Freq: Once | INTRAMUSCULAR | Status: AC
  Administered 2020-04-03: 22:00:00 2 mg via INTRAVENOUS
  Filled 2020-04-03: qty 1

## 2020-04-03 NOTE — Progress Notes (Signed)
Update provided to son's [name], [phone], after password was given to me by son.

## 2020-04-03 NOTE — Progress Notes (Addendum)
ID Pt disoriented and confused C/o pain  Patient Vitals for the past 24 hrs:  BP Temp Temp src Pulse Resp SpO2  04/03/20 1730 101/81 98.5 F (36.9 C) Tympanic (!) 102 20 --  04/03/20 1715 (!) 104/41 98.5 F (36.9 C) Tympanic 98 20 94 %  04/03/20 0505 (!) 146/124 98.4 F (36.9 C) -- 97 20 (!) 79 %  04/03/20 0024 128/88 98.1 F (36.7 C) -- 99 18 92 %  04/02/20 2100 -- -- -- -- -- (!) 89 %  o/e ill resp distress, on 100% NRB Emaciated Chest b/l rhonchi Tachycardia abd soft Skin- abrasions in many places Moves all limbs spontaneously  CBC Latest Ref Rng & Units 04/03/2020 04/03/2020 04/02/2020  WBC 4.0 - 10.5 K/uL - 21.5(H) -  Hemoglobin 13.0 - 17.0 g/dL 7.1(L) 7.8(L) 7.9(L)  Hematocrit 39.0 - 52.0 % 22.1(L) 23.8(L) 24.0(L)  Platelets 150 - 400 K/uL - 198 -    CMP Latest Ref Rng & Units 04/03/2020 04/02/2020 04/01/2020  Glucose 70 - 99 mg/dL 166(H) 145(H) 118(H)  BUN 8 - 23 mg/dL 79(H) 62(H) 37(H)  Creatinine 0.61 - 1.24 mg/dL 1.03 0.82 0.74  Sodium 135 - 145 mmol/L 149(H) 147(H) 141  Potassium 3.5 - 5.1 mmol/L 4.4 3.9 3.4(L)  Chloride 98 - 111 mmol/L 113(H) 110 107  CO2 22 - 32 mmol/L 31 27 27   Calcium 8.9 - 10.3 mg/dL 7.7(L) 7.6(L) 7.7(L)  Total Protein 6.5 - 8.1 g/dL 4.6(L) 4.6(L) 4.6(L)  Total Bilirubin 0.3 - 1.2 mg/dL 0.7 1.1 0.9  Alkaline Phos 38 - 126 U/L 50 51 53  AST 15 - 41 U/L 35 30 24  ALT 0 - 44 U/L 15 15 11     Micro 12/14 BC- MSSA 12/16 BC NG MRSA nares positive    Impression/recommendation COVID illness with pneumonia Acute hypoxic respiratory failure on 100% non rebreather  MSSa bacteremia on cefazolin  Encephalopathy  Worsening  leucocytosis Pt has MRSA nares and had rt side infiltrate- recommend repeating CT chest to look for worsening pneumonia, abscess, empyema Will change cefazolin to vancomycin for now Anemia COPD Poor nutrition Palliative on board ID will follow him peripherally this weekend- call [phone] if needed

## 2020-04-03 NOTE — Progress Notes (Signed)
Nutrition Follow-up  DOCUMENTATION CODES:   Severe malnutrition in context of chronic illness  INTERVENTION:   Nepro Shake po TID, each supplement provides 425 kcal and 19 grams protein  Magic cup TID with meals, each supplement provides 290 kcal and 9 grams of protein  MVI daily   Recommend B12 supplementation   Pt at high refeed risk; recommend monitor potassium, magnesium and phosphorus labs daily until stable  NUTRITION DIAGNOSIS:   Severe Malnutrition related to chronic illness (COPD, Parkinson's) as evidenced by severe fat depletion,severe muscle depletion.  GOAL:   Patient will meet greater than or equal to 90% of their needs -not met   MONITOR:   PO intake,Supplement acceptance,Labs,Weight trends,Skin,I & O's  ASSESSMENT:   79 year old male with history of HTN, COPD, seizure disorder, Parkinson's, DM, NSTEMI, CAD and dysphagia who is admitted with COVID 19 PNA, sepsis, Afib and encephalopathy  Visited pt's room today. Pt is unresponsive and unable to provide any nutrition information. Pt has continued to have poor appetite and oral intake since admit and refused all supplements and is now not awake enough to eat. Pt has been without adequate nutrition for > 7 days. Nasogastric tube feeds were recommended and offered but family does not wish to have feeding tube placed. Palliative care is following; family would like to see how pt does over the weekend before making decisions about GOC.   Per chart, pt up ~4lbs since admit but has remained fairly weight stable for the past six days.   Medications reviewed and include: vitamin C, folic acid, insulin, MVI, protonix, KPhos, thiamine, B12, zinc, cefazolin, NaCl w/ 5% dextrose and KCl @50ml /hr   Labs reviewed: Na 149(H), K 4.4 wnl, BUN 79(H) P 2.7 wnl, Mg 2.4 wnl- 12/22 Wbc- 21.5(H), Hgb 7.1(L), Hct 22.1(L)  NUTRITION - FOCUSED PHYSICAL EXAM:  Flowsheet Row Most Recent Value  Orbital Region Severe depletion   Upper Arm Region Severe depletion  Thoracic and Lumbar Region Severe depletion  Buccal Region Severe depletion  Temple Region Severe depletion  Clavicle Bone Region Severe depletion  Clavicle and Acromion Bone Region Severe depletion  Scapular Bone Region Severe depletion  Dorsal Hand Severe depletion  Patellar Region Severe depletion  Anterior Thigh Region Severe depletion  Posterior Calf Region Severe depletion  Edema (RD Assessment) None  Hair Reviewed  Eyes Reviewed  Mouth Reviewed  Skin Reviewed  Nails Reviewed     Diet Order:   Diet Order            DIET - DYS 1 Room service appropriate? Yes with Assist; Fluid consistency: Nectar Thick  Diet effective now                EDUCATION NEEDS:   Not appropriate for education at this time  Skin:  Skin Assessment: Reviewed RN Assessment  Last BM:  12/22- type 7  Height:   Ht Readings from Last 1 Encounters:  03/26/20 6' (1.829 m)    Weight:   Wt Readings from Last 1 Encounters:  04/02/20 62.5 kg    Ideal Body Weight:  80.9 kg  BMI:  Body mass index is 18.69 kg/m.  Estimated Nutritional Needs:   Kcal:  2000-2300kcal/day  Protein:  100-115g/day  Fluid:  1.6-1.9L/day  Koleen Distance MS, RD, LDN Please refer to [facility] for RD and/or RD on-call/weekend/after hours pager

## 2020-04-03 NOTE — Progress Notes (Signed)
SLP Cancellation Note  Patient Details Name: [name] MRN: [id number] DOB: Feb 21, 1941   Cancelled treatment:       Reason Eval/Treat Not Completed: Medical issues which prohibited therapy;Fatigue/lethargy limiting ability to participate;Patient's level of consciousness (chart reviewed; consulted NSG). Per NSG and chart reports, pt is not arousable; po's including meds and meals are not being given w/ the declined alertness. Pt has not been taking much po at meals per chart notes.  Recommend holding any po's including meds if pt is Not alert/awake for safe oral intake. Recommend continue w/ a dysphagia consistency diet w/ aspiration precautions; frequent oral care for hygiene and stimulation of swallowing. ST services will f/u w/ pt's status next 1-3 days.      Orinda Kenner, MS, CCC-SLP Speech Language Pathologist [facility] [phone] [facility] 04/03/2020, 2:51 PM

## 2020-04-03 NOTE — Progress Notes (Signed)
CSW informed by RN that patient's son would like to speak to someone about Medicaid application status. CSW reached out to [place] and asked her to follow up with patient's son when able.  Oleh Genin, [place]

## 2020-04-03 NOTE — Progress Notes (Signed)
Paged MD to notify, holding am po meds, pt is not arouseable. Gave d50 for fsbs = 52. Will recheck per protocol.

## 2020-04-03 NOTE — Progress Notes (Addendum)
Progress Note    [name]  CBJ:[id number] DOB: [birth year]  DOA: 03/12/2020 PCP: Marguarite Arbour, MD      Brief Narrative:    Medical records reviewed and are as summarized below:  [name] is a 79 y.o. male       Assessment/Plan:   Active Problems:   SOB (shortness of breath)   Essential hypertension   COPD (chronic obstructive pulmonary disease) (HCC)   Seizure disorder (HCC)   Parkinson's variant of multiple system atrophy (HCC)   PSVT (paroxysmal supraventricular tachycardia) (HCC)   CAD (coronary artery disease)   Severe sepsis (HCC)   Pneumonia due to COVID-19 virus   Acute respiratory failure due to COVID-19 [facility])   Acute hypernatremia   Rapid atrial fibrillation, new onset (HCC)   Acute metabolic encephalopathy   Bacterial pneumonia   Sepsis (HCC)   Goals of care, counseling/discussion   Palliative care by specialist   DNR (do not resuscitate)   MSSA bacteremia   Protein-calorie malnutrition, severe   Nutrition Problem: Severe Malnutrition Etiology: chronic illness (COPD, Parkinson's)  Signs/Symptoms: severe fat depletion,severe muscle depletion   Body mass index is 18.69 kg/m.  (Underweight)     Severe sepsis/septic shocksecondary to covid 19 viral pneumonia  Acute hypoxic respiratory failure  Superimposed Bacteria Community Acquired Pneumonia  MSSA Bacteremia Completed IV remdesivir. Blood culture on 03/16/2020 showed MSSA.  No growth on repeat culture on 03/27/2020.  ID recommends treatment with IV Ancef for 2 to 3 weeks. - 12/14 blood cx with staph aureus (mssa)  echo (EF 40-45%, global hypokinesis, valves not well visualized), repeat blood cx - endocarditis thought unlikely per ID  Continue 15 L/min oxygen via HFNC.  He is DNR so no plan for intubation and mechanical ventilation.  ABG done today showed pH of 7.4, PO2 of 196 and PCO2 of 48.  Proning position as tolerated. Use incentive spirometer as  able.     Acute metabolic encephalopathy Secondary to acute infection and hypoxia. Ammonia level is within normal limits   Leukocytosis: Increasing WBC.  Continue antibiotics.   Recent GI bleed likely leading to acute blood loss anemia Continue IV Protonix. Hemoglobin is dropped to 7.1 from 12.5 2 days ago. Discussed risks and benefits of blood transfusion with his son, Bernette Redbird.  He will discuss this with his sister and get back to [place].  Lactic Acidosis:  Likely from sepsis, hypoxia Repeat lactic acid tomorrow   Supraventricular Tachycardia - appreciate cardiology assistance -> paroxysmal atrial tachycardia vs MAT  - metop PO 50 mg BID -> cards recommending transition to toprol xl when stabilized - prn metop for sustained HR > 120 Cardiologist has signed off.  HFrEF: does not appear volume overloaded. New onset Continue metoprolol.  Losartan held because of hypotension.   Elevated D dimer: follow LE [place] (negative for DVT) and CT PE protocol (without PE)  Acute hypernatremia Sodium level is trending up.  Start IV fluids for hydration.  Abnormal TFT:  This is likely abnormal because of acute illness.  Outpatient follow-up.   Folate Deficiency  B12 Deficiency:  Continue folic acid and vitamin B12 supplement as able  Prolonged QT interval Monitor QT interval on EKG. Avoid antipsychotics including Risperdal for now.  Other comorbidities include COPD, CAD, hypertension, Parkinson's disease   Case was discussed with Crystal from palliative care team.  She said she had spoken to the patient's wife who acknowledged that patient may may pass away within the next  few days.  However, she wanted to wait and see how things go through the weekend before making a decision about comfort care.   Plan of care was discussed with his son, [name], over the phone.   Delanna Ahmadi, after discussing blood transfusion with her sister and his mother (patient's wife) has agreed to  proceed with blood transfusion.  Transfuse 1 unit of packed red blood cells and monitor H&H.   CRITICAL CARE Performed by: Jennye Boroughs   Total critical care time: 35  minutes  Critical care time was exclusive of separately billable procedures and treating other patients.  Critical care was necessary to treat or prevent imminent or life-threatening deterioration.  Critical care was time spent personally by me on the following activities: development of treatment plan with patient and/or surrogate as well as nursing, discussions with consultants, evaluation of patient's response to treatment, examination of patient, obtaining history from patient or surrogate, ordering and performing treatments and interventions, ordering and review of laboratory studies, ordering and review of radiographic studies, pulse oximetry and re-evaluation of patient's condition.    Diet Order            DIET - DYS 1 Room service appropriate? Yes with Assist; Fluid consistency: Nectar Thick  Diet effective now                    Consultants:  Cardiologist  Infectious disease  Procedures:  None    Medications:   . vitamin C  500 mg Oral Daily  . chlorhexidine  15 mL Mouth Rinse BID  . feeding supplement (NEPRO CARB STEADY)  237 mL Oral TID BM  . folic acid  1 mg Oral Daily  . insulin aspart  0-5 Units Subcutaneous QHS  . insulin aspart  0-9 Units Subcutaneous TID WC  . linagliptin  5 mg Oral Daily  . mouth rinse  15 mL Mouth Rinse q12n4p  . metoprolol tartrate  50 mg Oral BID  . multivitamin with minerals  1 tablet Oral Daily  . nystatin  5 mL Oral QID  . [START ON 04/05/2020] pantoprazole  40 mg Intravenous Q12H  . phosphorus  500 mg Oral Once  . rosuvastatin  5 mg Oral Daily  . thiamine  100 mg Oral Daily  . vitamin B-12  1,000 mcg Oral Daily  . zinc sulfate  220 mg Oral Daily   Continuous Infusions: . sodium chloride 1,000 mL (04/01/20 0635)  .  ceFAZolin (ANCEF) IV 2 g  (04/03/20 1258)  . dextrose 5 % and 0.45 % NaCl with KCl 20 mEq/L 50 mL/hr at 04/03/20 0826  . pantoprozole (PROTONIX) infusion 8 mg/hr (04/02/20 0811)     Anti-infectives (From admission, onward)   Start     Dose/Rate Route Frequency Ordered Stop   04/01/20 1600  ceFAZolin (ANCEF) IVPB 2g/100 mL premix        2 g 200 mL/hr over 30 Minutes Intravenous Every 8 hours 04/01/20 1513 04/15/20 2359   04/01/20 1400  ceFAZolin (ANCEF) 2 g in dextrose 5 % 100 mL IVPB  Status:  Discontinued        2 g 240 mL/hr over 30 Minutes Intravenous Every 8 hours 04/01/20 0833 04/01/20 1513   03/28/20 1000  cefTRIAXone (ROCEPHIN) 2 g in sodium chloride 0.9 % 100 mL IVPB  Status:  Discontinued        2 g 200 mL/hr over 30 Minutes Intravenous Every 24 hours 03/27/20 0827 03/27/20 1557   03/27/20  2000  ceFAZolin (ANCEF) IVPB 2g/100 mL premix  Status:  Discontinued        2 g 200 mL/hr over 30 Minutes Intravenous Every 8 hours 03/27/20 1559 04/01/20 0833   03/27/20 1000  remdesivir 100 mg in sodium chloride 0.9 % 100 mL IVPB       "Followed by" Linked Group Details   100 mg 200 mL/hr over 30 Minutes Intravenous Daily 03/26/20 0146 03/30/20 1015   03/27/20 0930  cefTRIAXone (ROCEPHIN) 1 g in sodium chloride 0.9 % 100 mL IVPB        1 g 200 mL/hr over 30 Minutes Intravenous  Once 03/27/20 0830 03/27/20 1124   03/26/20 2300  azithromycin (ZITHROMAX) 500 mg in sodium chloride 0.9 % 250 mL IVPB  Status:  Discontinued        500 mg 250 mL/hr over 60 Minutes Intravenous Every 24 hours 03/26/20 0218 03/26/20 1448   03/26/20 2200  cefTRIAXone (ROCEPHIN) 1 g in sodium chloride 0.9 % 100 mL IVPB  Status:  Discontinued        1 g 200 mL/hr over 30 Minutes Intravenous Every 24 hours 03/26/20 2053 03/27/20 0826   03/26/20 2200  azithromycin (ZITHROMAX) 500 mg in sodium chloride 0.9 % 250 mL IVPB  Status:  Discontinued        500 mg 250 mL/hr over 60 Minutes Intravenous Every 24 hours 03/26/20 2053 03/27/20 1559    03/26/20 0300  cefTRIAXone (ROCEPHIN) 1 g in sodium chloride 0.9 % 100 mL IVPB  Status:  Discontinued        1 g 200 mL/hr over 30 Minutes Intravenous Every 24 hours 03/26/20 0218 03/26/20 1448   03/26/20 0145  remdesivir 200 mg in sodium chloride 0.9% 250 mL IVPB       "Followed by" Linked Group Details   200 mg 580 mL/hr over 30 Minutes Intravenous Once 03/26/20 0146 03/26/20 0556   Apr 09, 2020 2245  levofloxacin (LEVAQUIN) IVPB 750 mg        750 mg 100 mL/hr over 90 Minutes Intravenous  Once 04-09-20 2244 03/26/20 0039             Family Communication/Anticipated D/C date and plan/Code Status   DVT prophylaxis: Place and maintain sequential compression device Start: 04/01/20 1715     Code Status: DNR  Family Communication: Plan discussed with his son, Bernette Redbird Disposition Plan:    Status is: Inpatient  Remains inpatient appropriate because:IV treatments appropriate due to intensity of illness or inability to take PO and Inpatient level of care appropriate due to severity of illness   Dispo: The patient is from: Home              Anticipated d/c is to: SNF              Anticipated d/c date is: > 3 days              Patient currently is not medically stable to d/c.           Subjective:   Interval events noted.  He still requiring 15 L/min oxygen via HFNC.  His nurse, Rose, was at the bedside during this encounter.  Objective:    Vitals:   04/02/20 2002 04/02/20 2100 04/03/20 0024 04/03/20 0505  BP: (!) 127/115  128/88 (!) 146/124  Pulse: 98  99 97  Resp: 17  18 20   Temp: 98.2 F (36.8 C)  98.1 F (36.7 C) 98.4 F (36.9 C)  TempSrc:  SpO2:  (!) 89% 92% (!) 79%  Weight:      Height:       No data found.  No intake or output data in the 24 hours ending 04/03/20 1627 Filed Weights   03/31/20 0500 04/01/20 0500 04/02/20 0500  Weight: 62.7 kg 62.5 kg 62.5 kg    Exam:  GEN: NAD SKIN: Warm and dry.  Poor skin turgor. EYES: EOMI ENT: Dry  mucous membranes CV: RRR PULM: CTA B ABD: soft, ND, NT, +BS CNS: Lethargic, confused. EXT: No edema or tenderness     Data Reviewed:   I have personally reviewed following labs and imaging studies:  Labs: Labs show the following:   Basic Metabolic Panel: Recent Labs  Lab 03/28/20 0245 03/29/20 0617 03/30/20 0426 03/31/20 0417 04/01/20 0543 04/02/20 0526 04/03/20 0549  NA 146*   < > 136 136 141 147* 149*  K 3.6   < > 3.5 3.6 3.4* 3.9 4.4  CL 109   < > 101 100 107 110 113*  CO2 28   < > 28 26 27 27 31   GLUCOSE 127*   < > 161* 121* 118* 145* 166*  BUN 25*   < > 14 20 37* 62* 79*  CREATININE 0.59*   < > 0.46* 0.52* 0.74 0.82 1.03  CALCIUM 7.6*   < > 7.4* 7.5* 7.7* 7.6* 7.7*  MG 2.1  --   --   --  2.2 2.4  --   PHOS 2.4*  --   --   --  1.4* 2.7  --    < > = values in this interval not displayed.   GFR Estimated Creatinine Clearance: 53.1 mL/min (by C-G formula based on SCr of 1.03 mg/dL). Liver Function Tests: Recent Labs  Lab 03/30/20 0426 03/31/20 0417 04/01/20 0543 04/02/20 0526 04/03/20 0549  AST 24 26 24 30  35  ALT 12 14 11 15 15   ALKPHOS 54 60 53 51 50  BILITOT 1.1 1.3* 0.9 1.1 0.7  PROT 5.2* 4.9* 4.6* 4.6* 4.6*  ALBUMIN 2.5* 2.3* 2.2* 2.2* 2.2*   No results for input(s): LIPASE, AMYLASE in the last 168 hours. No results for input(s): AMMONIA in the last 168 hours. Coagulation profile No results for input(s): INR, PROTIME in the last 168 hours.  CBC: Recent Labs  Lab 03/30/20 0426 03/31/20 0417 04/01/20 0543 04/01/20 1730 04/02/20 0526 04/02/20 1344 04/02/20 2145 04/03/20 0549 04/03/20 1403  WBC 14.6* 15.1* 18.8*  --  21.1*  --   --  21.5*  --   NEUTROABS 13.3* 13.6* 15.2*  --  18.3*  --   --  18.7*  --   HGB 14.2 14.0 12.5*   < > 10.1* 8.5* 7.9* 7.8* 7.1*  HCT 40.4 40.1 35.7*   < > 30.2* 25.6* 24.0* 23.8* 22.1*  MCV 86.9 85.3 86.0  --  89.9  --   --  93.0  --   PLT 200 224 291  --  255  --   --  198  --    < > = values in this interval  not displayed.   Cardiac Enzymes: No results for input(s): CKTOTAL, CKMB, CKMBINDEX, TROPONINI in the last 168 hours. BNP (last 3 results) No results for input(s): PROBNP in the last 8760 hours. CBG: Recent Labs  Lab 04/02/20 1228 04/02/20 2151 04/03/20 0809 04/03/20 0941 04/03/20 1209  GLUCAP 131* 125* 52* 121* 71   D-Dimer: No results for input(s): DDIMER in the last 72 hours.  Hgb A1c: No results for input(s): HGBA1C in the last 72 hours. Lipid Profile: No results for input(s): CHOL, HDL, LDLCALC, TRIG, CHOLHDL, LDLDIRECT in the last 72 hours. Thyroid function studies: No results for input(s): TSH, T4TOTAL, T3FREE, THYROIDAB in the last 72 hours.  Invalid input(s): FREET3 Anemia work up: Recent Labs    04/01/20 0543  FERRITIN 198   Sepsis Labs: Recent Labs  Lab 03/28/20 0245 03/28/20 0444 03/29/20 0617 03/31/20 0417 04/01/20 0543 04/02/20 0526 04/03/20 0549  PROCALCITON <0.10  --   --   --   --   --   --   WBC 11.9*  --    < > 15.1* 18.8* 21.1* 21.5*  LATICACIDVEN  --  2.3*  --   --   --   --   --    < > = values in this interval not displayed.    Microbiology Recent Results (from the past 240 hour(s))  Blood culture (routine single)     Status: Abnormal   Collection Time: 04-10-20  9:58 PM   Specimen: BLOOD  Result Value Ref Range Status   Specimen Description   Final    BLOOD BLOOD RIGHT FOREARM Performed at [facility], [address], [place], [place] [address]    Special Requests   Final    BOTTLES DRAWN AEROBIC AND ANAEROBIC Blood Culture adequate volume Performed at [facility], [address] [place], [place] [address]    Culture  Setup Time   Final    GRAM POSITIVE COCCI ANAEROBIC BOTTLE ONLY Organism ID to follow CRITICAL RESULT CALLED TO, READ BACK BY AND VERIFIED WITH: JASON ROBBINS AT 6767 03/27/20 [place] Performed at [facility], [address] [place], [place] [address]    Culture STAPHYLOCOCCUS  AUREUS (A)  Final   Report Status 03/29/2020 FINAL  Final   Organism ID, Bacteria STAPHYLOCOCCUS AUREUS  Final      Susceptibility   Staphylococcus aureus - MIC*    CIPROFLOXACIN <=0.5 SENSITIVE Sensitive     ERYTHROMYCIN <=0.25 SENSITIVE Sensitive     GENTAMICIN <=0.5 SENSITIVE Sensitive     OXACILLIN 0.5 SENSITIVE Sensitive     TETRACYCLINE <=1 SENSITIVE Sensitive     VANCOMYCIN 1 SENSITIVE Sensitive     TRIMETH/SULFA <=10 SENSITIVE Sensitive     CLINDAMYCIN <=0.25 SENSITIVE Sensitive     RIFAMPIN <=0.5 SENSITIVE Sensitive     Inducible Clindamycin NEGATIVE Sensitive     * STAPHYLOCOCCUS AUREUS  Resp Panel by RT-PCR (Flu A&B, Covid) Nasopharyngeal Swab     Status: Abnormal   Collection Time: 10-Apr-2020  9:58 PM   Specimen: Nasopharyngeal Swab; Nasopharyngeal(NP) swabs in vial transport medium  Result Value Ref Range Status   SARS Coronavirus 2 by RT PCR POSITIVE (A) NEGATIVE Final    Comment: RESULT CALLED TO, READ BACK BY AND VERIFIED WITH: JEN AGNEW RN [id number] 04-10-20 (NOTE) SARS-CoV-2 target nucleic acids are DETECTED.  The SARS-CoV-2 RNA is generally detectable in upper respiratory specimens during the acute phase of infection. Positive results are indicative of the presence of the identified virus, but do not rule out bacterial infection or co-infection with other pathogens not detected by the test. Clinical correlation with patient history and other diagnostic information is necessary to determine patient infection status. The expected result is Negative.  Fact Sheet for Patients: EntrepreneurPulse.com.au  Fact Sheet for Healthcare Providers: IncredibleEmployment.be  This test is not yet approved or cleared by the [place] FDA and  has been  authorized for detection and/or diagnosis of SARS-CoV-2 by FDA under an Emergency Use Authorization (EUA).  This EUA will remain in effect (meaning this test can be used)  for the duration of   the COVID-19 declaration under Section 564(b)(1) of the Act, 21 U.S.C. section 360bbb-3(b)(1), unless the authorization is terminated or revoked sooner.     Influenza A by PCR NEGATIVE NEGATIVE Final   Influenza B by PCR NEGATIVE NEGATIVE Final    Comment: (NOTE) The Xpert Xpress SARS-CoV-2/FLU/RSV plus assay is intended as an aid in the diagnosis of influenza from Nasopharyngeal swab specimens and should not be used as a sole basis for treatment. Nasal washings and aspirates are unacceptable for Xpert Xpress SARS-CoV-2/FLU/RSV testing.  Fact Sheet for Patients: BloggerCourse.com  Fact Sheet for Healthcare Providers: SeriousBroker.it  This test is not yet approved or cleared by the [place] FDA and has been authorized for detection and/or diagnosis of SARS-CoV-2 by FDA under an Emergency Use Authorization (EUA). This EUA will remain in effect (meaning this test can be used) for the duration of the COVID-19 declaration under Section 564(b)(1) of the Act, 21 U.S.C. section 360bbb-3(b)(1), unless the authorization is terminated or revoked.  Performed at [facility], [address] [place], [place] [address]   Blood Culture ID Panel (Reflexed)     Status: Abnormal   Collection Time: 2020-04-05  9:58 PM  Result Value Ref Range Status   Enterococcus faecalis NOT DETECTED NOT DETECTED Final   Enterococcus Faecium NOT DETECTED NOT DETECTED Final   Listeria monocytogenes NOT DETECTED NOT DETECTED Final   Staphylococcus species DETECTED (A) NOT DETECTED Final    Comment: CRITICAL RESULT CALLED TO, READ BACK BY AND VERIFIED WITH:  JASON ROBBINS AT 0509 03/27/20 SDR    Staphylococcus aureus (BCID) DETECTED (A) NOT DETECTED Final    Comment: CRITICAL RESULT CALLED TO, READ BACK BY AND VERIFIED WITH:  JASON ROBBINS AT 0509 03/27/20 SDR    Staphylococcus epidermidis NOT DETECTED NOT DETECTED Final   Staphylococcus  lugdunensis NOT DETECTED NOT DETECTED Final   Streptococcus species NOT DETECTED NOT DETECTED Final   Streptococcus agalactiae NOT DETECTED NOT DETECTED Final   Streptococcus pneumoniae NOT DETECTED NOT DETECTED Final   Streptococcus pyogenes NOT DETECTED NOT DETECTED Final   A.calcoaceticus-baumannii NOT DETECTED NOT DETECTED Final   Bacteroides fragilis NOT DETECTED NOT DETECTED Final   Enterobacterales NOT DETECTED NOT DETECTED Final   Enterobacter cloacae complex NOT DETECTED NOT DETECTED Final   Escherichia coli NOT DETECTED NOT DETECTED Final   Klebsiella aerogenes NOT DETECTED NOT DETECTED Final   Klebsiella oxytoca NOT DETECTED NOT DETECTED Final   Klebsiella pneumoniae NOT DETECTED NOT DETECTED Final   Proteus species NOT DETECTED NOT DETECTED Final   Salmonella species NOT DETECTED NOT DETECTED Final   Serratia marcescens NOT DETECTED NOT DETECTED Final   Haemophilus influenzae NOT DETECTED NOT DETECTED Final   Neisseria meningitidis NOT DETECTED NOT DETECTED Final   Pseudomonas aeruginosa NOT DETECTED NOT DETECTED Final   Stenotrophomonas maltophilia NOT DETECTED NOT DETECTED Final   Candida albicans NOT DETECTED NOT DETECTED Final   Candida auris NOT DETECTED NOT DETECTED Final   Candida glabrata NOT DETECTED NOT DETECTED Final   Candida krusei NOT DETECTED NOT DETECTED Final   Candida parapsilosis NOT DETECTED NOT DETECTED Final   Candida tropicalis NOT DETECTED NOT DETECTED Final   Cryptococcus neoformans/gattii NOT DETECTED NOT DETECTED Final   Meth resistant mecA/C and MREJ NOT DETECTED NOT DETECTED Final  Comment: Performed at [facility], [address] [place], [place] [address]  CULTURE, BLOOD (ROUTINE X 2) w Reflex to ID Panel     Status: None   Collection Time: 03/27/20  7:33 AM   Specimen: BLOOD  Result Value Ref Range Status   Specimen Description BLOOD LEFT HAND  Final   Special Requests   Final    BOTTLES DRAWN AEROBIC AND ANAEROBIC Blood  Culture results may not be optimal due to an excessive volume of blood received in culture bottles   Culture   Final    NO GROWTH 5 DAYS Performed at [facility], [address] [place], [place] [address]    Report Status 04/01/2020 FINAL  Final  CULTURE, BLOOD (ROUTINE X 2) w Reflex to ID Panel     Status: None   Collection Time: 03/27/20  7:35 AM   Specimen: BLOOD  Result Value Ref Range Status   Specimen Description BLOOD RIGHT HAND  Final   Special Requests   Final    BOTTLES DRAWN AEROBIC AND ANAEROBIC Blood Culture results may not be optimal due to an excessive volume of blood received in culture bottles   Culture   Final    NO GROWTH 5 DAYS Performed at [facility], [address] [place], [place] [address]    Report Status 04/01/2020 FINAL  Final  MRSA PCR Screening     Status: Abnormal   Collection Time: 03/27/20 12:00 PM   Specimen: Nasopharyngeal  Result Value Ref Range Status   MRSA by PCR POSITIVE (A) NEGATIVE Final    Comment:        The GeneXpert MRSA Assay (FDA approved for NASAL specimens only), is one component of a comprehensive MRSA colonization surveillance program. It is not intended to diagnose MRSA infection nor to guide or monitor treatment for MRSA infections. RESULT CALLED TO, READ BACK BY AND VERIFIED WITHCorbin Ade AT 1354 03/27/20 SDR Performed at [facility], [address], [place], [place] [address]   Urine culture     Status: None   Collection Time: 03/27/20 12:08 PM   Specimen: In/Out Cath Urine  Result Value Ref Range Status   Specimen Description   Final    IN/OUT CATH URINE Performed at [facility], [address], [place], [place] [address]    Special Requests   Final    NONE Performed at [facility], [address] [place], [place] [address]    Culture   Final    NO GROWTH Performed at [facility], [address], [place], [place] [address]    Report  Status 03/29/2020 FINAL  Final    Procedures and diagnostic studies:  No results found.             LOS: 8 days   Lakina Mcintire  [facility]   Pager on [email or web address]. If 7PM-7AM, please contact night-coverage at [email or web address]     04/03/2020, 4:27 PM

## 2020-04-03 NOTE — Progress Notes (Signed)
Phone consent given for pt to receive blood with son [name]. Consent is in chart.  [phone]

## 2020-04-04 LAB — BPAM RBC
Blood Product Expiration Date: 202201222359
ISSUE DATE / TIME: 202112231719
Unit Type and Rh: 5100

## 2020-04-04 LAB — TYPE AND SCREEN
ABO/RH(D): O POS
Antibody Screen: NEGATIVE
Unit division: 0

## 2020-04-04 MED ORDER — VANCOMYCIN HCL IN DEXTROSE 1-5 GM/200ML-% IV SOLN
1000.0000 mg | Freq: Once | INTRAVENOUS | Status: AC
  Administered 2020-04-04: 03:00:00 1000 mg via INTRAVENOUS
  Filled 2020-04-04: qty 200

## 2020-04-04 MED ORDER — LORAZEPAM 2 MG/ML IJ SOLN
1.0000 mg | INTRAMUSCULAR | Status: DC | PRN
  Administered 2020-04-04: 1 mg via INTRAVENOUS
  Filled 2020-04-04: qty 1

## 2020-04-04 MED ORDER — VANCOMYCIN HCL 500 MG/100ML IV SOLN
500.0000 mg | Freq: Two times a day (BID) | INTRAVENOUS | Status: DC
  Filled 2020-04-04: qty 100

## 2020-04-12 NOTE — Progress Notes (Signed)
Pharmacy Antibiotic Note  [name] is a 80 y.o. male admitted on 13-Apr-2020 with pneumonia.  Pharmacy has been consulted for Vancomycin dosing.  Plan:  Vancomycin 1gm x 1 then 500mg  IV q12hrs  Expected AUC ~460 and Cssmin ~14.4  Goal trough ~ 15 per ID provider  Daily BMP  Height: 6' 0.01" (182.9 cm) Weight: 62.5 kg (137 lb 12.6 oz) IBW/kg (Calculated) : 77.62  Temp (24hrs), Avg:97.5 F (36.4 C), Min:96.2 F (35.7 C), Max:98.5 F (36.9 C)  Recent Labs  Lab 03/30/20 0426 03/31/20 0417 04/01/20 0543 04/02/20 0526 04/03/20 0549  WBC 14.6* 15.1* 18.8* 21.1* 21.5*  CREATININE 0.46* 0.52* 0.74 0.82 1.03    Estimated Creatinine Clearance: 53.1 mL/min (by C-G formula based on SCr of 1.03 mg/dL).    Allergies  Allergen Reactions  . Levaquin [Levofloxacin In D5w] Nausea And Vomiting  . Penicillins Hives    Childhood allergy. Has patient had a PCN reaction causing immediate rash, facial/tongue/throat swelling, SOB or lightheadedness with hypotension: Unknown Has patient had a PCN reaction causing severe rash involving mucus membranes or skin necrosis: Unknown Has patient had a PCN reaction that required hospitalization: Unknown Has patient had a PCN reaction occurring within the last 10 years: No If all of the above answers are "NO", then may proceed with Cephalosporin use.     Antimicrobials this admission: Levofloxacin 12/14 x 1 Remdesivir 12/15 >> 12/19 Ceftriaxone 12/15 >> 12/16 Azithromycin 12/15 x 1 Cefazolin 12/16 >> 12/23 (MSSA bacteremia) Vancomycin 12/24 >>   Microbiology results:  MRSA PCR:  + 03/27/20  Thank you for allowing pharmacy to be a part of this patient's care.  Dorothe Pea, PharmD, BCPS 03/12/2020 9:27 AM

## 2020-04-12 NOTE — Progress Notes (Signed)
Pharmacy Antibiotic Note  [name] is a 80 y.o. male admitted on 03/21/2020 with pneumonia.  Pharmacy has been consulted for Vancomycin dosing.  Plan: Vancomycin 1gm x 1 then 500mg  IV q12hrs  Height: 6' (182.9 cm) Weight: 62.5 kg (137 lb 12.6 oz) IBW/kg (Calculated) : 77.6  Temp (24hrs), Avg:97.6 F (36.4 C), Min:96.2 F (35.7 C), Max:98.5 F (36.9 C)  Recent Labs  Lab 03/28/20 0444 03/29/20 0617 03/30/20 0426 03/31/20 0417 04/01/20 0543 04/02/20 0526 04/03/20 0549  WBC  --    < > 14.6* 15.1* 18.8* 21.1* 21.5*  CREATININE  --    < > 0.46* 0.52* 0.74 0.82 1.03  LATICACIDVEN 2.3*  --   --   --   --   --   --    < > = values in this interval not displayed.    Estimated Creatinine Clearance: 53.1 mL/min (by C-G formula based on SCr of 1.03 mg/dL).    Allergies  Allergen Reactions  . Levaquin [Levofloxacin In D5w] Nausea And Vomiting  . Penicillins Hives    Childhood allergy. Has patient had a PCN reaction causing immediate rash, facial/tongue/throat swelling, SOB or lightheadedness with hypotension: Unknown Has patient had a PCN reaction causing severe rash involving mucus membranes or skin necrosis: Unknown Has patient had a PCN reaction that required hospitalization: Unknown Has patient had a PCN reaction occurring within the last 10 years: No If all of the above answers are "NO", then may proceed with Cephalosporin use.     Antimicrobials this admission:   >>    >>   Dose adjustments this admission:   Microbiology results:  BCx:   UCx:    Sputum:    MRSA PCR:   Thank you for allowing pharmacy to be a part of this patient's care.  Hart Robinsons A 04-09-2020 3:41 AM

## 2020-04-12 NOTE — Death Summary Note (Signed)
DEATH SUMMARY   Patient Details  Name: [name] MRN: [id number] DOB: 11/10/1940  Admission/Discharge Information   Admit Date:  04/15/20  Date of Death: Date of Death: 2020/04/25  Time of Death: Time of Death: 0533  Length of Stay: Aug 10, 2022  Referring Physician: Idelle Crouch, MD   Reason(s) for Hospitalization  Shortness of breath secondary to COVID-19 pneumonia  Diagnoses  Preliminary cause of death:  Secondary Diagnoses (including complications and co-morbidities):  Principal Problem:   Pneumonia due to COVID-19 virus Active Problems:   SOB (shortness of breath)   Essential hypertension   COPD (chronic obstructive pulmonary disease) (HCC)   Seizure disorder (HCC)   Parkinson's variant of multiple system atrophy ([place])   PSVT (paroxysmal supraventricular tachycardia) (HCC)   CAD (coronary artery disease)   Severe sepsis ([place])   Acute respiratory failure due to COVID-19 [facility]   Acute hypernatremia   Rapid atrial fibrillation, new onset ([place])   Acute metabolic encephalopathy   Bacterial pneumonia   Sepsis ([place])   Goals of care, counseling/discussion   Palliative care by specialist   DNR (do not resuscitate)   MSSA bacteremia   Protein-calorie malnutrition, severe   Brief Hospital Course (including significant findings, care, treatment, and services provided and events leading to death)  [name] is a 80 y.o. year old male with medical history significant forHTN, COPD, seizure disorder, Parkinson's, CAD, with history of COVID-19 exposure from his wife (who was recently discharged from the hospital). He presented to the hospital because of cough, increasing shortness of breath, poor oral intake and altered mental status.  He was found to have septic shock secondary to ZDGUY-[id number] pneumonia complicated by superimposed bacterial pneumonia. He also had acute hypoxic respiratory failure, acute metabolic encephalopathy and hyponatremia on presentation. He  was treated with empiric IV antibiotics, IV fluids, IV steroids and IV remdesivir. Blood cultures were positive for MSSA consistent with MSSA bacteremia. Infectious disease specialist was consulted to assist with management.Cardiologist was also consulted for tachycardia that was thought to be due to multifocal atrial tachycardia/atrial tachycardia. 2D echo showed that patient has chronic systolic CHF with EF of 40 to 45%.  He developed acute GI bleeding of unknown etiology. This was complicated by acute blood loss anemia. He was transfused with 1 unit of packed red blood cells. Unfortunately, his condition deteriorated. He was requiring as high as 15 L/min oxygen via high flow nasal cannula. Palliative care was consulted to assist with discussion regarding goals of care. He was a DO NOT RESUSCITATE and his father did not want him to be on life support. Unfortunately, he expired on 2020/04/25 at 5:33 AM.   Pertinent Labs and Studies  Significant Diagnostic Studies CT ANGIO CHEST PE W OR WO CONTRAST  Result Date: 03/26/2020 CLINICAL DATA:  80 year old male with concern for pulmonary embolism. EXAM: CT ANGIOGRAPHY CHEST WITH CONTRAST TECHNIQUE: Multidetector CT imaging of the chest was performed using the standard protocol during bolus administration of intravenous contrast. Multiplanar CT image reconstructions and MIPs were obtained to evaluate the vascular anatomy. CONTRAST:  10mL OMNIPAQUE IOHEXOL 350 MG/ML SOLN COMPARISON:  CT dated 08/09/2017. FINDINGS: Cardiovascular: Top-normal cardiac size. No pericardial effusion. There is 3 vessel coronary vascular calcification. Moderate atherosclerotic calcification of the thoracic aorta. No aneurysmal dilatation or dissection. Evaluation of the pulmonary arteries is limited due to respiratory motion artifact. No pulmonary artery embolus identified. Mediastinum/Nodes: Top-normal right hilar lymph nodes, likely reactive. No mediastinal adenopathy. The esophagus is  grossly unremarkable. Enlargement  of the left thyroid lobe similar to studies dating back to 2015. No mediastinal fluid collection. Lungs/Pleura: Background of moderate to severe emphysema. There is a patchy area of airspace opacity involving the right lower lobe and right infrahilar region most consistent with pneumonia. Clinical correlation and follow-up to resolution recommended. There is no pleural effusion pneumothorax. The central airways remain patent. There is however partial occlusion of the right lower lobe bronchus and therefore aspiration or and occlusive lesion is not entirely excluded. Upper Abdomen: No acute abnormality. Musculoskeletal: Osteopenia with degenerative changes of the spine. No acute osseous pathology. Review of the MIP images confirms the above findings. IMPRESSION: 1. No CT evidence of pulmonary embolism. 2. Right lower lobe pneumonia. Aspiration is not excluded. Clinical correlation and follow-up to resolution recommended. 3. Aortic Atherosclerosis (ICD10-I70.0) and Emphysema (ICD10-J43.9). Electronically Signed   By: Elgie Collard M.D.   On: 03/26/2020 15:54   US Venous Img Lower Bilateral (DVT)  Result Date: 03/27/2020 CLINICAL DATA:  Elevated D-dimer.  Evaluate for DVT. EXAM: BILATERAL LOWER EXTREMITY VENOUS DOPPLER ULTRASOUND TECHNIQUE: Gray-scale sonography with graded compression, as well as color Doppler and duplex ultrasound were performed to evaluate the lower extremity deep venous systems from the level of the common femoral vein and including the common femoral, femoral, profunda femoral, popliteal and calf veins including the posterior tibial, peroneal and gastrocnemius veins when visible. The superficial great saphenous vein was also interrogated. Spectral Doppler was utilized to evaluate flow at rest and with distal augmentation maneuvers in the common femoral, femoral and popliteal veins. COMPARISON:  None. FINDINGS: RIGHT LOWER EXTREMITY Common Femoral Vein: No  evidence of thrombus. Normal compressibility, respiratory phasicity and response to augmentation. Saphenofemoral Junction: No evidence of thrombus. Normal compressibility and flow on color Doppler imaging. Profunda Femoral Vein: No evidence of thrombus. Normal compressibility and flow on color Doppler imaging. Femoral Vein: No evidence of thrombus. Normal compressibility, respiratory phasicity and response to augmentation. Popliteal Vein: No evidence of thrombus. Normal compressibility, respiratory phasicity and response to augmentation. Calf Veins: No evidence of thrombus. Normal compressibility and flow on color Doppler imaging. Superficial Great Saphenous Vein: No evidence of thrombus. Normal compressibility. Venous Reflux:  None. Other Findings:  None. LEFT LOWER EXTREMITY Common Femoral Vein: No evidence of thrombus. Normal compressibility, respiratory phasicity and response to augmentation. Saphenofemoral Junction: No evidence of thrombus. Normal compressibility and flow on color Doppler imaging. Profunda Femoral Vein: No evidence of thrombus. Normal compressibility and flow on color Doppler imaging. Femoral Vein: No evidence of thrombus. Normal compressibility, respiratory phasicity and response to augmentation. Popliteal Vein: No evidence of thrombus. Normal compressibility, respiratory phasicity and response to augmentation. Calf Veins: No evidence of thrombus. Normal compressibility and flow on color Doppler imaging. Superficial Great Saphenous Vein: No evidence of thrombus. Normal compressibility. Venous Reflux:  None. Other Findings:  None. IMPRESSION: No evidence of DVT within either lower extremity Electronically Signed   By: Simonne Come M.D.   On: 03/27/2020 08:01   DG Chest Port 1 View  Result Date: 04/03/2020 CLINICAL DATA:  Sepsis EXAM: PORTABLE CHEST 1 VIEW COMPARISON:  05/22/2018 FINDINGS: Patchy right basilar pulmonary infiltrate is present, possibly infectious in the acute setting. The  lungs are otherwise clear. No pneumothorax or pleural effusion. Cardiac size within normal limits. Pulmonary vascularity is normal. No acute bone abnormality. IMPRESSION: Patchy right basilar pulmonary infiltrate, possibly infectious in the acute setting. Electronically Signed   By: Helyn Numbers MD   On: 04/09/2020 22:16   ECHOCARDIOGRAM COMPLETE  Result Date: 03/27/2020    ECHOCARDIOGRAM REPORT   Patient Name:   Sabri Catalina Lunger [facility] Date of Exam: 03/27/2020 Medical Rec #:  [id number]             Height:       72.0 in Accession #:    [id number]            Weight:       137.8 lb Date of Birth:  [birth year]             BSA:          1.819 m Patient Age:    79 years              BP:           116/75 mmHg Patient Gender: M                     HR:           76 bpm. Exam Location:  [facility] Procedure: 2D Echo, Cardiac Doppler and Color Doppler Indications:     Abnormal ECG 794.31  History:         Patient has prior history of Echocardiogram examinations, most                  recent 05/12/2018. COPD; Risk Factors:Hypertension and Diabetes.  Sonographer:     Cristela Blue RDCS (AE) Referring Phys:  [phone] A CALDWELL POWELL JR Diagnosing Phys: Julien Nordmann MD  Sonographer Comments: Technically difficult study due to poor echo windows, no apical window and no parasternal window. Image acquisition challenging due to patient body habitus and Image acquisition challenging due to COPD. IMPRESSIONS  1. Left ventricular ejection fraction, by estimation, is 40 to 45%. The left ventricle has mildly decreased function. The left ventricle demonstrates global hypokinesis. Left ventricular diastolic parameters are indeterminate.  2. Right ventricular systolic function is normal. The right ventricular size is normal. Tricuspid regurgitation signal is inadequate for assessing PA pressure.  3. Ectopy appreciated. FINDINGS  Left Ventricle: Left ventricular ejection fraction, by estimation, is 40 to 45%. The left ventricle has mildly  decreased function. The left ventricle demonstrates global hypokinesis. The left ventricular internal cavity size was normal in size. There is  no left ventricular hypertrophy. Left ventricular diastolic parameters are indeterminate. Right Ventricle: The right ventricular size is normal. No increase in right ventricular wall thickness. Right ventricular systolic function is normal. Tricuspid regurgitation signal is inadequate for assessing PA pressure. Left Atrium: Left atrial size was normal in size. Right Atrium: Right atrial size was normal in size. Pericardium: There is no evidence of pericardial effusion. Mitral Valve: The mitral valve was not well visualized. No evidence of mitral valve regurgitation. No evidence of mitral valve stenosis. Tricuspid Valve: The tricuspid valve is not well visualized. Tricuspid valve regurgitation is not demonstrated. No evidence of tricuspid stenosis. Aortic Valve: The aortic valve was not well visualized. Aortic valve regurgitation is not visualized. No aortic stenosis is present. Pulmonic Valve: The pulmonic valve was not well visualized. Pulmonic valve regurgitation is not visualized. No evidence of pulmonic stenosis. Aorta: The aortic root is normal in size and structure. Venous: The inferior vena cava is normal in size with greater than 50% respiratory variability, suggesting right atrial pressure of 3 mmHg. IAS/Shunts: No atrial level shunt detected by color flow Doppler.  LEFT VENTRICLE PLAX 2D LVIDd:         4.30 cm LVIDs:  3.65 cm LV PW:         1.60 cm LV IVS:        1.20 cm  LEFT ATRIUM         Index LA diam:    4.70 cm 2.58 cm/m  PULMONIC VALVE PV Vmax:        0.87 m/s PV Peak grad:   3.0 mmHg RVOT Peak grad: 5 mmHg  Julien Nordmann MD Electronically signed by Julien Nordmann MD Signature Date/Time: 03/27/2020/4:43:00 PM    Final     Microbiology Recent Results (from the past 240 hour(s))  Blood culture (routine single)     Status: Abnormal   Collection  Time: 03/20/2020  9:58 PM   Specimen: BLOOD  Result Value Ref Range Status   Specimen Description   Final    BLOOD BLOOD RIGHT FOREARM Performed at [facility], [address], [place], [place] [address]    Special Requests   Final    BOTTLES DRAWN AEROBIC AND ANAEROBIC Blood Culture adequate volume Performed at [facility], [address] [place], [place] [address]    Culture  Setup Time   Final    GRAM POSITIVE COCCI ANAEROBIC BOTTLE ONLY Organism ID to follow CRITICAL RESULT CALLED TO, READ BACK BY AND VERIFIED WITH: JASON ROBBINS AT 7616 03/27/20 SDR Performed at [facility], [address] [place], [place] [address]    Culture STAPHYLOCOCCUS AUREUS (A)  Final   Report Status 03/29/2020 FINAL  Final   Organism ID, Bacteria STAPHYLOCOCCUS AUREUS  Final      Susceptibility   Staphylococcus aureus - MIC*    CIPROFLOXACIN <=0.5 SENSITIVE Sensitive     ERYTHROMYCIN <=0.25 SENSITIVE Sensitive     GENTAMICIN <=0.5 SENSITIVE Sensitive     OXACILLIN 0.5 SENSITIVE Sensitive     TETRACYCLINE <=1 SENSITIVE Sensitive     VANCOMYCIN 1 SENSITIVE Sensitive     TRIMETH/SULFA <=10 SENSITIVE Sensitive     CLINDAMYCIN <=0.25 SENSITIVE Sensitive     RIFAMPIN <=0.5 SENSITIVE Sensitive     Inducible Clindamycin NEGATIVE Sensitive     * STAPHYLOCOCCUS AUREUS  Resp Panel by RT-PCR (Flu A&B, Covid) Nasopharyngeal Swab     Status: Abnormal   Collection Time: 03/23/2020  9:58 PM   Specimen: Nasopharyngeal Swab; Nasopharyngeal(NP) swabs in vial transport medium  Result Value Ref Range Status   SARS Coronavirus 2 by RT PCR POSITIVE (A) NEGATIVE Final    Comment: RESULT CALLED TO, READ BACK BY AND VERIFIED WITH: JEN AGNEW RN 2324 03/15/2020 (NOTE) SARS-CoV-2 target nucleic acids are DETECTED.  The SARS-CoV-2 RNA is generally detectable in upper respiratory specimens during the acute phase of infection. Positive results are indicative of the presence of the identified  virus, but do not rule out bacterial infection or co-infection with other pathogens not detected by the test. Clinical correlation with patient history and other diagnostic information is necessary to determine patient infection status. The expected result is Negative.  Fact Sheet for Patients: BloggerCourse.com  Fact Sheet for Healthcare Providers: SeriousBroker.it  This test is not yet approved or cleared by the [place] FDA and  has been authorized for detection and/or diagnosis of SARS-CoV-2 by FDA under an Emergency Use Authorization (EUA).  This EUA will remain in effect (meaning this test can be used)  for the duration of  the COVID-19 declaration under Section 564(b)(1) of the Act, 21 U.S.C. section 360bbb-3(b)(1), unless the authorization is terminated or revoked sooner.  Influenza A by PCR NEGATIVE NEGATIVE Final   Influenza B by PCR NEGATIVE NEGATIVE Final    Comment: (NOTE) The Xpert Xpress SARS-CoV-2/FLU/RSV plus assay is intended as an aid in the diagnosis of influenza from Nasopharyngeal swab specimens and should not be used as a sole basis for treatment. Nasal washings and aspirates are unacceptable for Xpert Xpress SARS-CoV-2/FLU/RSV testing.  Fact Sheet for Patients: BloggerCourse.comhttps://www.fda.gov/media/152166/download  Fact Sheet for Healthcare Providers: SeriousBroker.ithttps://www.fda.gov/media/152162/download  This test is not yet approved or cleared by the [place] FDA and has been authorized for detection and/or diagnosis of SARS-CoV-2 by FDA under an Emergency Use Authorization (EUA). This EUA will remain in effect (meaning this test can be used) for the duration of the COVID-19 declaration under Section 564(b)(1) of the Act, 21 U.S.C. section 360bbb-3(b)(1), unless the authorization is terminated or revoked.  Performed at [facility], [address] [place], [place] [address]   Blood Culture ID  Panel (Reflexed)     Status: Abnormal   Collection Time: 03/21/2020  9:58 PM  Result Value Ref Range Status   Enterococcus faecalis NOT DETECTED NOT DETECTED Final   Enterococcus Faecium NOT DETECTED NOT DETECTED Final   Listeria monocytogenes NOT DETECTED NOT DETECTED Final   Staphylococcus species DETECTED (A) NOT DETECTED Final    Comment: CRITICAL RESULT CALLED TO, READ BACK BY AND VERIFIED WITH:  JASON ROBBINS AT 0509 03/27/20 SDR    Staphylococcus aureus (BCID) DETECTED (A) NOT DETECTED Final    Comment: CRITICAL RESULT CALLED TO, READ BACK BY AND VERIFIED WITH:  JASON ROBBINS AT 0509 03/27/20 SDR    Staphylococcus epidermidis NOT DETECTED NOT DETECTED Final   Staphylococcus lugdunensis NOT DETECTED NOT DETECTED Final   Streptococcus species NOT DETECTED NOT DETECTED Final   Streptococcus agalactiae NOT DETECTED NOT DETECTED Final   Streptococcus pneumoniae NOT DETECTED NOT DETECTED Final   Streptococcus pyogenes NOT DETECTED NOT DETECTED Final   A.calcoaceticus-baumannii NOT DETECTED NOT DETECTED Final   Bacteroides fragilis NOT DETECTED NOT DETECTED Final   Enterobacterales NOT DETECTED NOT DETECTED Final   Enterobacter cloacae complex NOT DETECTED NOT DETECTED Final   Escherichia coli NOT DETECTED NOT DETECTED Final   Klebsiella aerogenes NOT DETECTED NOT DETECTED Final   Klebsiella oxytoca NOT DETECTED NOT DETECTED Final   Klebsiella pneumoniae NOT DETECTED NOT DETECTED Final   Proteus species NOT DETECTED NOT DETECTED Final   Salmonella species NOT DETECTED NOT DETECTED Final   Serratia marcescens NOT DETECTED NOT DETECTED Final   Haemophilus influenzae NOT DETECTED NOT DETECTED Final   Neisseria meningitidis NOT DETECTED NOT DETECTED Final   Pseudomonas aeruginosa NOT DETECTED NOT DETECTED Final   Stenotrophomonas maltophilia NOT DETECTED NOT DETECTED Final   Candida albicans NOT DETECTED NOT DETECTED Final   Candida auris NOT DETECTED NOT DETECTED Final   Candida  glabrata NOT DETECTED NOT DETECTED Final   Candida krusei NOT DETECTED NOT DETECTED Final   Candida parapsilosis NOT DETECTED NOT DETECTED Final   Candida tropicalis NOT DETECTED NOT DETECTED Final   Cryptococcus neoformans/gattii NOT DETECTED NOT DETECTED Final   Meth resistant mecA/C and MREJ NOT DETECTED NOT DETECTED Final    Comment: Performed at [facility], [address] [place], [place] [address]  CULTURE, BLOOD (ROUTINE X 2) w Reflex to ID Panel     Status: None   Collection Time: 03/27/20  7:33 AM   Specimen: BLOOD  Result Value Ref Range Status   Specimen Description BLOOD LEFT HAND  Final  Special Requests   Final    BOTTLES DRAWN AEROBIC AND ANAEROBIC Blood Culture results may not be optimal due to an excessive volume of blood received in culture bottles   Culture   Final    NO GROWTH 5 DAYS Performed at [facility], [address] [place], [place] [address]    Report Status 04/01/2020 FINAL  Final  CULTURE, BLOOD (ROUTINE X 2) w Reflex to ID Panel     Status: None   Collection Time: 03/27/20  7:35 AM   Specimen: BLOOD  Result Value Ref Range Status   Specimen Description BLOOD RIGHT HAND  Final   Special Requests   Final    BOTTLES DRAWN AEROBIC AND ANAEROBIC Blood Culture results may not be optimal due to an excessive volume of blood received in culture bottles   Culture   Final    NO GROWTH 5 DAYS Performed at [facility], [address] [place], [place] [address]    Report Status 04/01/2020 FINAL  Final  MRSA PCR Screening     Status: Abnormal   Collection Time: 03/27/20 12:00 PM   Specimen: Nasopharyngeal  Result Value Ref Range Status   MRSA by PCR POSITIVE (A) NEGATIVE Final    Comment:        The GeneXpert MRSA Assay (FDA approved for NASAL specimens only), is one component of a comprehensive MRSA colonization surveillance program. It is not intended to diagnose MRSA infection nor to guide or monitor treatment  for MRSA infections. RESULT CALLED TO, READ BACK BY AND VERIFIED WITHMaryland Pink AT 0175 03/27/20 SDR Performed at [facility], [address] [place], [place] [address]   Urine culture     Status: None   Collection Time: 03/27/20 12:08 PM   Specimen: In/Out Cath Urine  Result Value Ref Range Status   Specimen Description   Final    IN/OUT CATH URINE Performed at [facility], [address] [place], [place] [address]    Special Requests   Final    NONE Performed at [facility], [address] [place], [place] [address]    Culture   Final    NO GROWTH Performed at [facility], [place] [address], [place], [place] [address]    Report Status 03/29/2020 FINAL  Final    Lab Basic Metabolic Panel: Recent Labs  Lab 03/30/20 0426 03/31/20 0417 04/01/20 0543 04/02/20 0526 04/03/20 0549  NA 136 136 141 147* 149*  K 3.5 3.6 3.4* 3.9 4.4  CL 101 100 107 110 113*  CO2 28 26 27 27 31   GLUCOSE 161* 121* 118* 145* 166*  BUN 14 20 37* 62* 79*  CREATININE 0.46* 0.52* 0.74 0.82 1.03  CALCIUM 7.4* 7.5* 7.7* 7.6* 7.7*  MG  --   --  2.2 2.4  --   PHOS  --   --  1.4* 2.7  --    Liver Function Tests: Recent Labs  Lab 03/30/20 0426 03/31/20 0417 04/01/20 0543 04/02/20 0526 04/03/20 0549  AST 24 26 24 30  35  ALT 12 14 11 15 15   ALKPHOS 54 60 53 51 50  BILITOT 1.1 1.3* 0.9 1.1 0.7  PROT 5.2* 4.9* 4.6* 4.6* 4.6*  ALBUMIN 2.5* 2.3* 2.2* 2.2* 2.2*   No results for input(s): LIPASE, AMYLASE in the last 168 hours. No results for input(s): AMMONIA in the last 168 hours. CBC: Recent Labs  Lab 03/30/20 0426 03/31/20 0417 04/01/20 0543 04/01/20  1730 04/02/20 0526 04/02/20 1344 04/02/20 2145 04/03/20 0549 04/03/20 1403 04/03/20 2235  WBC 14.6* 15.1* 18.8*  --  21.1*  --   --  21.5*  --   --   NEUTROABS 13.3* 13.6* 15.2*  --  18.3*  --   --  18.7*  --   --   HGB 14.2 14.0 12.5*   < > 10.1* 8.5* 7.9* 7.8* 7.1* 8.4*  HCT 40.4 40.1  35.7*   < > 30.2* 25.6* 24.0* 23.8* 22.1* 26.5*  MCV 86.9 85.3 86.0  --  89.9  --   --  93.0  --   --   PLT 200 224 291  --  255  --   --  198  --   --    < > = values in this interval not displayed.   Cardiac Enzymes: No results for input(s): CKTOTAL, CKMB, CKMBINDEX, TROPONINI in the last 168 hours. Sepsis Labs: Recent Labs  Lab 03/31/20 0417 04/01/20 0543 04/02/20 0526 04/03/20 0549  WBC 15.1* 18.8* 21.1* 21.5*    Procedures/Operations  None   [name] 04/18/2020, 3:48 PM

## 2020-04-12 NOTE — Progress Notes (Signed)
   04/03/20 2330  Assess: MEWS Score  Temp 97.6 F (36.4 C)  BP 91/62  Pulse Rate (!) 143  Resp 20  Level of Consciousness Alert  SpO2 (!) 80 %  O2 Device Nasal Cannula;HFNC  O2 Flow Rate (L/min) 15 L/min  Assess: MEWS Score  MEWS Temp 0  MEWS Systolic 1  MEWS Pulse 3  MEWS RR 0  MEWS LOC 0  MEWS Score 4  MEWS Score Color Red  Assess: if the MEWS score is Yellow or Red  Were vital signs taken at a resting state? Yes  Focused Assessment No change from prior assessment  Early Detection of Sepsis Score *See Row Information* High  MEWS guidelines implemented *See Row Information* Yes  Treat  MEWS Interventions Other (Comment) (MD notified, no intervention)  Pain Scale PAINAD  Faces Pain Scale 4  Take Vital Signs  Increase Vital Sign Frequency  Red: Q 1hr X 4 then Q 4hr X 4, if remains red, continue Q 4hrs  Notify: Charge Nurse/RN  Name of Charge Nurse/RN Notified Lelan Pons  Date Charge Nurse/RN Notified 04/03/20  Time Charge Nurse/RN Notified 2330  Notify: Provider  Provider Name/Title Rufina Falco NP  Date Provider Notified 04/03/20  Time Provider Notified 2300  Notification Type Page  Notification Reason Change in status  Response See new orders  Date of Provider Response 04/03/20  Time of Provider Response 2300  Document  Patient Outcome Other (Comment) (continue to monitor, patient has poor prognosis.)  Progress note created (see row info) Yes

## 2020-04-12 DEATH — deceased
